# Patient Record
Sex: Female | Born: 1940 | Race: White | Hispanic: No | Marital: Married | Smoking: Never smoker
Health system: Southern US, Community
[De-identification: ages and names within clinical notes are randomized; demographics above are authoritative.]

## PROBLEM LIST (undated history)

## (undated) DIAGNOSIS — Z923 Personal history of irradiation: Secondary | ICD-10-CM

## (undated) DIAGNOSIS — D696 Thrombocytopenia, unspecified: Secondary | ICD-10-CM

## (undated) DIAGNOSIS — C220 Liver cell carcinoma: Secondary | ICD-10-CM

## (undated) DIAGNOSIS — Z7189 Other specified counseling: Secondary | ICD-10-CM

## (undated) DIAGNOSIS — M8580 Other specified disorders of bone density and structure, unspecified site: Secondary | ICD-10-CM

## (undated) DIAGNOSIS — E039 Hypothyroidism, unspecified: Secondary | ICD-10-CM

## (undated) DIAGNOSIS — K7460 Unspecified cirrhosis of liver: Secondary | ICD-10-CM

## (undated) DIAGNOSIS — D219 Benign neoplasm of connective and other soft tissue, unspecified: Secondary | ICD-10-CM

## (undated) DIAGNOSIS — E119 Type 2 diabetes mellitus without complications: Secondary | ICD-10-CM

## (undated) DIAGNOSIS — C50919 Malignant neoplasm of unspecified site of unspecified female breast: Secondary | ICD-10-CM

## (undated) DIAGNOSIS — F0390 Unspecified dementia without behavioral disturbance: Secondary | ICD-10-CM

## (undated) DIAGNOSIS — E079 Disorder of thyroid, unspecified: Secondary | ICD-10-CM

## (undated) HISTORY — DX: Type 2 diabetes mellitus without complications: E11.9

## (undated) HISTORY — DX: Unspecified cirrhosis of liver: K74.60

## (undated) HISTORY — DX: Disorder of thyroid, unspecified: E07.9

## (undated) HISTORY — DX: Other specified disorders of bone density and structure, unspecified site: M85.80

## (undated) HISTORY — PX: CHOLECYSTECTOMY: SHX55

## (undated) HISTORY — DX: Liver cell carcinoma: C22.0

## (undated) HISTORY — DX: Malignant neoplasm of unspecified site of unspecified female breast: C50.919

## (undated) HISTORY — PX: ABDOMINAL HYSTERECTOMY: SHX81

## (undated) HISTORY — DX: Benign neoplasm of connective and other soft tissue, unspecified: D21.9

---
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
## 1976-03-25 HISTORY — PX: TUBAL LIGATION: SHX77

## 1997-10-25 ENCOUNTER — Other Ambulatory Visit: Admission: RE | Admit: 1997-10-25 | Discharge: 1997-10-25 | Payer: Self-pay | Admitting: Obstetrics and Gynecology

## 2001-08-06 ENCOUNTER — Encounter (INDEPENDENT_AMBULATORY_CARE_PROVIDER_SITE_OTHER): Payer: Self-pay | Admitting: Specialist

## 2001-08-06 ENCOUNTER — Inpatient Hospital Stay (HOSPITAL_COMMUNITY): Admission: RE | Admit: 2001-08-06 | Discharge: 2001-08-08 | Payer: Self-pay | Admitting: Obstetrics and Gynecology

## 2004-06-14 ENCOUNTER — Ambulatory Visit: Payer: Self-pay | Admitting: Gastroenterology

## 2004-06-15 ENCOUNTER — Ambulatory Visit: Payer: Self-pay | Admitting: Gastroenterology

## 2004-06-20 ENCOUNTER — Ambulatory Visit: Payer: Self-pay | Admitting: Gastroenterology

## 2006-03-25 DIAGNOSIS — Z923 Personal history of irradiation: Secondary | ICD-10-CM

## 2006-03-25 HISTORY — DX: Personal history of irradiation: Z92.3

## 2006-03-25 HISTORY — PX: BREAST LUMPECTOMY: SHX2

## 2006-05-24 DIAGNOSIS — C50919 Malignant neoplasm of unspecified site of unspecified female breast: Secondary | ICD-10-CM

## 2006-05-24 HISTORY — DX: Malignant neoplasm of unspecified site of unspecified female breast: C50.919

## 2006-06-23 ENCOUNTER — Encounter: Admission: RE | Admit: 2006-06-23 | Discharge: 2006-06-23 | Payer: Self-pay | Admitting: General Surgery

## 2006-06-25 ENCOUNTER — Encounter (INDEPENDENT_AMBULATORY_CARE_PROVIDER_SITE_OTHER): Payer: Self-pay | Admitting: Specialist

## 2006-06-25 HISTORY — PX: BREAST LUMPECTOMY WITH AXILLARY LYMPH NODE BIOPSY: SHX5593

## 2006-06-26 ENCOUNTER — Encounter: Admission: RE | Admit: 2006-06-26 | Discharge: 2006-06-26 | Payer: Self-pay | Admitting: General Surgery

## 2006-06-26 ENCOUNTER — Ambulatory Visit (HOSPITAL_COMMUNITY): Admission: RE | Admit: 2006-06-26 | Discharge: 2006-06-26 | Payer: Self-pay | Admitting: General Surgery

## 2006-06-26 ENCOUNTER — Ambulatory Visit (HOSPITAL_BASED_OUTPATIENT_CLINIC_OR_DEPARTMENT_OTHER): Admission: RE | Admit: 2006-06-26 | Discharge: 2006-06-26 | Payer: Self-pay | Admitting: General Surgery

## 2006-07-09 ENCOUNTER — Ambulatory Visit: Payer: Self-pay | Admitting: Oncology

## 2006-07-16 LAB — CBC WITH DIFFERENTIAL/PLATELET
Eosinophils Absolute: 0 10*3/uL (ref 0.0–0.5)
LYMPH%: 28 % (ref 14.0–48.0)
MCH: 34.2 pg — ABNORMAL HIGH (ref 26.0–34.0)
MCHC: 35 g/dL (ref 32.0–36.0)
MCV: 97.8 fL (ref 81.0–101.0)
MONO%: 6.6 % (ref 0.0–13.0)
NEUT#: 1.8 10*3/uL (ref 1.5–6.5)
Platelets: 185 10*3/uL (ref 145–400)
RBC: 3.71 10*6/uL (ref 3.70–5.32)

## 2006-07-16 LAB — COMPREHENSIVE METABOLIC PANEL
Alkaline Phosphatase: 75 U/L (ref 39–117)
Glucose, Bld: 197 mg/dL — ABNORMAL HIGH (ref 70–99)
Sodium: 141 mEq/L (ref 135–145)
Total Bilirubin: 0.6 mg/dL (ref 0.3–1.2)
Total Protein: 7 g/dL (ref 6.0–8.3)

## 2006-07-16 LAB — LACTATE DEHYDROGENASE: LDH: 159 U/L (ref 94–250)

## 2006-07-16 LAB — CANCER ANTIGEN 27.29: CA 27.29: 30 U/mL (ref 0–39)

## 2006-07-24 ENCOUNTER — Ambulatory Visit (HOSPITAL_COMMUNITY): Admission: RE | Admit: 2006-07-24 | Discharge: 2006-07-24 | Payer: Self-pay | Admitting: Oncology

## 2006-07-24 ENCOUNTER — Encounter: Admission: RE | Admit: 2006-07-24 | Discharge: 2006-07-24 | Payer: Self-pay | Admitting: Oncology

## 2006-07-30 ENCOUNTER — Ambulatory Visit: Admission: RE | Admit: 2006-07-30 | Discharge: 2006-10-08 | Payer: Self-pay | Admitting: Radiation Oncology

## 2006-07-31 LAB — MORPHOLOGY

## 2006-07-31 LAB — CBC WITH DIFFERENTIAL/PLATELET
BASO%: 0.6 % (ref 0.0–2.0)
Basophils Absolute: 0 10*3/uL (ref 0.0–0.1)
EOS%: 2.3 % (ref 0.0–7.0)
HGB: 12.9 g/dL (ref 11.6–15.9)
MCH: 34.3 pg — ABNORMAL HIGH (ref 26.0–34.0)
MCHC: 35.2 g/dL (ref 32.0–36.0)
MCV: 97.4 fL (ref 81.0–101.0)
MONO%: 9 % (ref 0.0–13.0)
RDW: 13.6 % (ref 11.3–14.5)
lymph#: 0.9 10*3/uL (ref 0.9–3.3)

## 2006-09-23 ENCOUNTER — Ambulatory Visit: Payer: Self-pay | Admitting: Oncology

## 2006-09-25 LAB — COMPREHENSIVE METABOLIC PANEL
CO2: 23 mEq/L (ref 19–32)
Calcium: 9.7 mg/dL (ref 8.4–10.5)
Creatinine, Ser: 1.1 mg/dL (ref 0.40–1.20)
Glucose, Bld: 86 mg/dL (ref 70–99)
Total Bilirubin: 0.5 mg/dL (ref 0.3–1.2)

## 2006-09-25 LAB — CBC WITH DIFFERENTIAL/PLATELET
Basophils Absolute: 0 10*3/uL (ref 0.0–0.1)
EOS%: 1.4 % (ref 0.0–7.0)
HCT: 35.4 % (ref 34.8–46.6)
HGB: 12.5 g/dL (ref 11.6–15.9)
LYMPH%: 20.3 % (ref 14.0–48.0)
MCH: 35 pg — ABNORMAL HIGH (ref 26.0–34.0)
MCV: 99.1 fL (ref 81.0–101.0)
MONO%: 13 % (ref 0.0–13.0)
NEUT%: 64.9 % (ref 39.6–76.8)
Platelets: 142 10*3/uL — ABNORMAL LOW (ref 145–400)

## 2007-01-14 ENCOUNTER — Ambulatory Visit: Payer: Self-pay | Admitting: Oncology

## 2007-01-16 LAB — COMPREHENSIVE METABOLIC PANEL
ALT: 44 U/L — ABNORMAL HIGH (ref 0–35)
CO2: 24 mEq/L (ref 19–32)
Calcium: 9 mg/dL (ref 8.4–10.5)
Chloride: 105 mEq/L (ref 96–112)
Glucose, Bld: 231 mg/dL — ABNORMAL HIGH (ref 70–99)
Sodium: 139 mEq/L (ref 135–145)
Total Bilirubin: 0.4 mg/dL (ref 0.3–1.2)
Total Protein: 6.8 g/dL (ref 6.0–8.3)

## 2007-01-16 LAB — CBC WITH DIFFERENTIAL/PLATELET
Eosinophils Absolute: 0 10*3/uL (ref 0.0–0.5)
HCT: 34.6 % — ABNORMAL LOW (ref 34.8–46.6)
LYMPH%: 21.1 % (ref 14.0–48.0)
MONO#: 0.3 10*3/uL (ref 0.1–0.9)
NEUT#: 2 10*3/uL (ref 1.5–6.5)
NEUT%: 68.6 % (ref 39.6–76.8)
Platelets: 142 10*3/uL — ABNORMAL LOW (ref 145–400)
RBC: 3.48 10*6/uL — ABNORMAL LOW (ref 3.70–5.32)
WBC: 2.9 10*3/uL — ABNORMAL LOW (ref 3.9–10.0)
lymph#: 0.6 10*3/uL — ABNORMAL LOW (ref 0.9–3.3)

## 2007-01-16 LAB — LACTATE DEHYDROGENASE: LDH: 172 U/L (ref 94–250)

## 2007-04-17 ENCOUNTER — Encounter: Admission: RE | Admit: 2007-04-17 | Discharge: 2007-04-17 | Payer: Self-pay | Admitting: Oncology

## 2007-06-04 ENCOUNTER — Ambulatory Visit: Payer: Self-pay | Admitting: Oncology

## 2007-06-08 LAB — CBC WITH DIFFERENTIAL/PLATELET
Basophils Absolute: 0 10*3/uL (ref 0.0–0.1)
EOS%: 1.6 % (ref 0.0–7.0)
Eosinophils Absolute: 0.1 10*3/uL (ref 0.0–0.5)
HGB: 12.7 g/dL (ref 11.6–15.9)
MONO#: 0.3 10*3/uL (ref 0.1–0.9)
NEUT#: 2.7 10*3/uL (ref 1.5–6.5)
RDW: 13.3 % (ref 11.3–14.5)
WBC: 3.8 10*3/uL — ABNORMAL LOW (ref 3.9–10.0)
lymph#: 0.8 10*3/uL — ABNORMAL LOW (ref 0.9–3.3)

## 2007-06-09 LAB — COMPREHENSIVE METABOLIC PANEL
AST: 90 U/L — ABNORMAL HIGH (ref 0–37)
Albumin: 4.2 g/dL (ref 3.5–5.2)
BUN: 16 mg/dL (ref 6–23)
CO2: 22 mEq/L (ref 19–32)
Calcium: 9.3 mg/dL (ref 8.4–10.5)
Chloride: 104 mEq/L (ref 96–112)
Glucose, Bld: 174 mg/dL — ABNORMAL HIGH (ref 70–99)
Potassium: 3.9 mEq/L (ref 3.5–5.3)

## 2007-06-09 LAB — CANCER ANTIGEN 27.29: CA 27.29: 34 U/mL (ref 0–39)

## 2007-06-09 LAB — LACTATE DEHYDROGENASE: LDH: 181 U/L (ref 94–250)

## 2007-09-23 ENCOUNTER — Encounter: Admission: RE | Admit: 2007-09-23 | Discharge: 2007-09-23 | Payer: Self-pay | Admitting: Oncology

## 2008-01-18 ENCOUNTER — Ambulatory Visit: Payer: Self-pay | Admitting: Oncology

## 2008-01-20 LAB — CBC WITH DIFFERENTIAL/PLATELET
BASO%: 0.3 % (ref 0.0–2.0)
EOS%: 1.8 % (ref 0.0–7.0)
HCT: 33.5 % — ABNORMAL LOW (ref 34.8–46.6)
MCH: 36.2 pg — ABNORMAL HIGH (ref 26.0–34.0)
MCHC: 35.3 g/dL (ref 32.0–36.0)
MCV: 102.5 fL — ABNORMAL HIGH (ref 81.0–101.0)
MONO%: 9.1 % (ref 0.0–13.0)
NEUT%: 62.9 % (ref 39.6–76.8)
lymph#: 0.8 10*3/uL — ABNORMAL LOW (ref 0.9–3.3)

## 2008-01-21 LAB — LACTATE DEHYDROGENASE: LDH: 168 U/L (ref 94–250)

## 2008-01-21 LAB — COMPREHENSIVE METABOLIC PANEL
ALT: 71 U/L — ABNORMAL HIGH (ref 0–35)
AST: 69 U/L — ABNORMAL HIGH (ref 0–37)
Alkaline Phosphatase: 96 U/L (ref 39–117)
Calcium: 9.1 mg/dL (ref 8.4–10.5)
Chloride: 106 mEq/L (ref 96–112)
Creatinine, Ser: 0.77 mg/dL (ref 0.40–1.20)
Total Bilirubin: 0.6 mg/dL (ref 0.3–1.2)

## 2008-01-21 LAB — VITAMIN D 25 HYDROXY (VIT D DEFICIENCY, FRACTURES): Vit D, 25-Hydroxy: 34 ng/mL (ref 30–89)

## 2008-02-03 LAB — CBC & DIFF AND RETIC
BASO%: 0.5 % (ref 0.0–2.0)
Basophils Absolute: 0 10*3/uL (ref 0.0–0.1)
EOS%: 1.4 % (ref 0.0–7.0)
HGB: 12.9 g/dL (ref 11.6–15.9)
MCH: 36.3 pg — ABNORMAL HIGH (ref 26.0–34.0)
MCHC: 35.1 g/dL (ref 32.0–36.0)
MCV: 103.4 fL — ABNORMAL HIGH (ref 81.0–101.0)
MONO%: 8.2 % (ref 0.0–13.0)
RDW: 13.3 % (ref 11.3–14.5)
RETIC #: 46.4 10*3/uL (ref 19.7–115.1)
lymph#: 1 10*3/uL (ref 0.9–3.3)

## 2008-02-03 LAB — MORPHOLOGY: PLT EST: DECREASED

## 2008-02-03 LAB — CHCC SMEAR

## 2008-02-05 LAB — VITAMIN B12: Vitamin B-12: 402 pg/mL (ref 211–911)

## 2008-02-05 LAB — PROTEIN ELECTROPHORESIS, SERUM
Beta Globulin: 8.1 % — ABNORMAL HIGH (ref 4.7–7.2)
Total Protein, Serum Electrophoresis: 7.1 g/dL (ref 6.0–8.3)

## 2008-02-05 LAB — FOLATE RBC: RBC Folate: 704 ng/mL — ABNORMAL HIGH (ref 180–600)

## 2008-02-05 LAB — IRON AND TIBC
%SAT: 22 % (ref 20–55)
Iron: 92 ug/dL (ref 42–145)
TIBC: 421 ug/dL (ref 250–470)
UIBC: 329 ug/dL

## 2008-02-09 ENCOUNTER — Ambulatory Visit (HOSPITAL_COMMUNITY): Admission: RE | Admit: 2008-02-09 | Discharge: 2008-02-09 | Payer: Self-pay | Admitting: Internal Medicine

## 2008-03-30 ENCOUNTER — Encounter (INDEPENDENT_AMBULATORY_CARE_PROVIDER_SITE_OTHER): Payer: Self-pay | Admitting: General Surgery

## 2008-03-30 ENCOUNTER — Inpatient Hospital Stay (HOSPITAL_COMMUNITY): Admission: RE | Admit: 2008-03-30 | Discharge: 2008-04-01 | Payer: Self-pay | Admitting: General Surgery

## 2008-04-20 ENCOUNTER — Encounter: Admission: RE | Admit: 2008-04-20 | Discharge: 2008-04-20 | Payer: Self-pay | Admitting: Oncology

## 2008-05-11 ENCOUNTER — Ambulatory Visit (HOSPITAL_COMMUNITY): Admission: RE | Admit: 2008-05-11 | Discharge: 2008-05-11 | Payer: Self-pay | Admitting: Internal Medicine

## 2008-08-15 ENCOUNTER — Ambulatory Visit: Payer: Self-pay | Admitting: Oncology

## 2008-08-17 LAB — CBC WITH DIFFERENTIAL/PLATELET
BASO%: 0.4 % (ref 0.0–2.0)
Basophils Absolute: 0 10*3/uL (ref 0.0–0.1)
EOS%: 1.3 % (ref 0.0–7.0)
HCT: 36.2 % (ref 34.8–46.6)
HGB: 12.4 g/dL (ref 11.6–15.9)
MCH: 35.6 pg — ABNORMAL HIGH (ref 25.1–34.0)
MCHC: 34.4 g/dL (ref 31.5–36.0)
MONO#: 0.3 10*3/uL (ref 0.1–0.9)
NEUT%: 61.5 % (ref 38.4–76.8)
RDW: 13.6 % (ref 11.2–14.5)
WBC: 3.6 10*3/uL — ABNORMAL LOW (ref 3.9–10.3)
lymph#: 1 10*3/uL (ref 0.9–3.3)

## 2008-08-17 LAB — COMPREHENSIVE METABOLIC PANEL
ALT: 58 U/L — ABNORMAL HIGH (ref 0–35)
AST: 70 U/L — ABNORMAL HIGH (ref 0–37)
Albumin: 3.6 g/dL (ref 3.5–5.2)
CO2: 29 mEq/L (ref 19–32)
Calcium: 9.4 mg/dL (ref 8.4–10.5)
Chloride: 104 mEq/L (ref 96–112)
Potassium: 4.2 mEq/L (ref 3.5–5.3)

## 2008-08-17 LAB — LACTATE DEHYDROGENASE: LDH: 159 U/L (ref 94–250)

## 2008-08-18 LAB — VITAMIN D 25 HYDROXY (VIT D DEFICIENCY, FRACTURES): Vit D, 25-Hydroxy: 30 ng/mL (ref 30–89)

## 2008-10-24 ENCOUNTER — Ambulatory Visit: Payer: Self-pay | Admitting: Oncology

## 2008-11-09 ENCOUNTER — Encounter: Admission: RE | Admit: 2008-11-09 | Discharge: 2008-11-09 | Payer: Self-pay | Admitting: Oncology

## 2008-11-23 ENCOUNTER — Ambulatory Visit: Payer: Self-pay | Admitting: Oncology

## 2008-12-09 ENCOUNTER — Encounter: Admission: RE | Admit: 2008-12-09 | Discharge: 2008-12-09 | Payer: Self-pay | Admitting: Gastroenterology

## 2009-04-26 ENCOUNTER — Encounter: Admission: RE | Admit: 2009-04-26 | Discharge: 2009-04-26 | Payer: Self-pay | Admitting: Oncology

## 2009-05-15 ENCOUNTER — Ambulatory Visit: Payer: Self-pay | Admitting: Oncology

## 2009-05-17 LAB — COMPREHENSIVE METABOLIC PANEL
AST: 93 U/L — ABNORMAL HIGH (ref 0–37)
Albumin: 3.8 g/dL (ref 3.5–5.2)
BUN: 14 mg/dL (ref 6–23)
CO2: 27 mEq/L (ref 19–32)
Calcium: 9 mg/dL (ref 8.4–10.5)
Chloride: 107 mEq/L (ref 96–112)
Total Protein: 7 g/dL (ref 6.0–8.3)

## 2009-05-17 LAB — CBC WITH DIFFERENTIAL/PLATELET
Basophils Absolute: 0 10*3/uL (ref 0.0–0.1)
EOS%: 1.8 % (ref 0.0–7.0)
HGB: 12.9 g/dL (ref 11.6–15.9)
MCHC: 34.7 g/dL (ref 31.5–36.0)
MONO%: 9.2 % (ref 0.0–14.0)
NEUT%: 61 % (ref 38.4–76.8)
RDW: 13.3 % (ref 11.2–14.5)
lymph#: 1 10*3/uL (ref 0.9–3.3)

## 2009-05-17 LAB — VITAMIN D 25 HYDROXY (VIT D DEFICIENCY, FRACTURES): Vit D, 25-Hydroxy: 34 ng/mL (ref 30–89)

## 2009-06-23 ENCOUNTER — Encounter: Admission: RE | Admit: 2009-06-23 | Discharge: 2009-06-23 | Payer: Self-pay | Admitting: Gastroenterology

## 2009-10-04 ENCOUNTER — Encounter (HOSPITAL_COMMUNITY): Admission: RE | Admit: 2009-10-04 | Discharge: 2009-11-29 | Payer: Self-pay | Admitting: Internal Medicine

## 2010-02-19 ENCOUNTER — Ambulatory Visit: Payer: Self-pay | Admitting: Oncology

## 2010-02-21 ENCOUNTER — Ambulatory Visit: Payer: Self-pay | Admitting: Oncology

## 2010-02-21 LAB — COMPREHENSIVE METABOLIC PANEL
AST: 98 U/L — ABNORMAL HIGH (ref 0–37)
Albumin: 3.6 g/dL (ref 3.5–5.2)
CO2: 26 mEq/L (ref 19–32)
Calcium: 9.3 mg/dL (ref 8.4–10.5)
Chloride: 104 mEq/L (ref 96–112)

## 2010-02-21 LAB — CBC WITH DIFFERENTIAL/PLATELET
BASO%: 0.7 % (ref 0.0–2.0)
Basophils Absolute: 0 10*3/uL (ref 0.0–0.1)
EOS%: 1.9 % (ref 0.0–7.0)
Eosinophils Absolute: 0.1 10*3/uL (ref 0.0–0.5)
LYMPH%: 25 % (ref 14.0–49.7)
MCV: 103.3 fL — ABNORMAL HIGH (ref 79.5–101.0)
MONO#: 0.3 10*3/uL (ref 0.1–0.9)
Platelets: 107 10*3/uL — ABNORMAL LOW (ref 145–400)
WBC: 3.2 10*3/uL — ABNORMAL LOW (ref 3.9–10.3)
lymph#: 0.8 10*3/uL — ABNORMAL LOW (ref 0.9–3.3)

## 2010-02-21 LAB — LACTATE DEHYDROGENASE: LDH: 203 U/L (ref 94–250)

## 2010-02-22 LAB — CANCER ANTIGEN 27.29: CA 27.29: 24 U/mL (ref 0–39)

## 2010-02-22 LAB — VITAMIN D 25 HYDROXY (VIT D DEFICIENCY, FRACTURES): Vit D, 25-Hydroxy: 32 ng/mL (ref 30–89)

## 2010-05-17 ENCOUNTER — Other Ambulatory Visit: Payer: Self-pay | Admitting: Oncology

## 2010-05-17 DIAGNOSIS — Z9889 Other specified postprocedural states: Secondary | ICD-10-CM

## 2010-05-25 ENCOUNTER — Ambulatory Visit
Admission: RE | Admit: 2010-05-25 | Discharge: 2010-05-25 | Disposition: A | Discharge status: Home / Self Care | Payer: Medicare Other | Source: Ambulatory Visit | Attending: Oncology | Admitting: Oncology

## 2010-05-25 DIAGNOSIS — Z9889 Other specified postprocedural states: Secondary | ICD-10-CM

## 2010-07-09 LAB — COMPREHENSIVE METABOLIC PANEL
ALT: 104 U/L — ABNORMAL HIGH (ref 0–35)
Alkaline Phosphatase: 64 U/L (ref 39–117)
BUN: 11 mg/dL (ref 6–23)
CO2: 28 mEq/L (ref 19–32)
Calcium: 8.8 mg/dL (ref 8.4–10.5)
GFR calc non Af Amer: >60 mL/min (ref >60)
Glucose, Bld: 173 mg/dL — ABNORMAL HIGH (ref 70–99)
Sodium: 136 mEq/L (ref 135–145)

## 2010-07-09 LAB — HEPATITIS B SURFACE ANTIBODY,QUALITATIVE: Hep B S Ab: NEGATIVE

## 2010-07-09 LAB — HEPATITIS B SURFACE ANTIGEN: Hepatitis B Surface Ag: NEGATIVE

## 2010-07-09 LAB — GLUCOSE, CAPILLARY
Glucose-Capillary: 132 mg/dL — ABNORMAL HIGH (ref 70–99)
Glucose-Capillary: 141 mg/dL — ABNORMAL HIGH (ref 70–99)
Glucose-Capillary: 189 mg/dL — ABNORMAL HIGH (ref 70–99)
Glucose-Capillary: 228 mg/dL — ABNORMAL HIGH (ref 70–99)
Glucose-Capillary: 268 mg/dL — ABNORMAL HIGH (ref 70–99)

## 2010-07-25 ENCOUNTER — Other Ambulatory Visit: Payer: Self-pay | Admitting: Gastroenterology

## 2010-07-25 DIAGNOSIS — C229 Malignant neoplasm of liver, not specified as primary or secondary: Secondary | ICD-10-CM

## 2010-07-25 DIAGNOSIS — K7460 Unspecified cirrhosis of liver: Secondary | ICD-10-CM

## 2010-07-30 ENCOUNTER — Ambulatory Visit
Admission: RE | Admit: 2010-07-30 | Discharge: 2010-07-30 | Disposition: A | Discharge status: Home / Self Care | Payer: Medicare Other | Source: Ambulatory Visit | Attending: Gastroenterology | Admitting: Gastroenterology

## 2010-07-30 DIAGNOSIS — K7460 Unspecified cirrhosis of liver: Secondary | ICD-10-CM

## 2010-07-30 DIAGNOSIS — C229 Malignant neoplasm of liver, not specified as primary or secondary: Secondary | ICD-10-CM

## 2010-08-07 NOTE — Consult Note (Signed)
Beverly Davis, Beverly Davis              ACCOUNT NO.:  [id number]   MEDICAL RECORD NO.:  [id number]          PATIENT TYPE:  OIB   LOCATION:  5128                         FACILITY:  [facility]   PHYSICIAN:  Tory Emerald. Benson Norway, MD    DATE OF BIRTH:  02/01/1941   DATE OF CONSULTATION:  03/30/2008  DATE OF DISCHARGE:                                 CONSULTATION   REASON FOR CONSULTATION:  Choledocholithiasis.   REFERRING PHYSICIAN:  Mare Loan, MD   PRIMARY CARE Owen Pagnotta:  Darcus Austin, DO   HISTORY OF PRESENT ILLNESS:  This is a 70 year old female with a past  medical history of hypothyroidism, diabetes, hyperlipidemia, status post  breast cancer, status post tubal ligation and hysterectomy.  She was  admitted to the hospital for an elective laparoscopic cholecystectomy.  The patient apparently had complained of some abdominal pain and workup  through her primary care [name] revealed that there was a gallbladder  mass in the fundus that measure approximately 1.2 cm and it was  hypervascular.  The recommendation was for her undergo a surgical  excision of this type of finding.  The patient was also found to have  abnormal liver enzymes during her prior workup and there was also the  suggestion of cirrhosis during the ultrasound.  Subsequently, the  patient was referred to Dr. Zenia Resides and she underwent a laparoscopic  cholecystectomy without complications.  During the procedure, there was  a gross appearance of cirrhosis on the liver and a biopsy was obtained  and the intraoperative cholangiogram did reveal there was a question of  stones, most likely a collection of stones in the distal CBD and there  was no drainage of contrast into the duodenum.  Subsequently, GI  consultation was requested for further evaluation and treatment.  In  regards to her cirrhosis, there was no history of alcohol abuse or any  illicit drug use.  The patient does have a family history significant  for cirrhosis in  a grandaunt and also her younger brother; however,  there was a significant history of alcohol abuse in her younger brother.  She does have history of hypothyroidism and also diabetes, but there was  no other family history of autoimmune diseases.  Unfortunately at this  time, the patient is still somnolent from her anesthesia and she is  unable to answer any questions for herself.   PAST MEDICAL HISTORY AND PAST SURGICAL HISTORY:  As stated above.   FAMILY HISTORY:  Noncontributory.   SOCIAL HISTORY:  She is married, has one child.  No alcohol, tobacco, or  illicit drug use.   REVIEW OF SYSTEMS:  Unable to obtain at this time.   HOME MEDICATIONS:  1. Levothyroxine 88 mg one p.o. daily.  2. Metformin 500 mg p.o. b.i.d.  3. Tricor 145 mg of p.o. daily.  4. Zetia 10 mg p.o. daily.  5. Calcium 600 mg 2 tablets p.o. daily.  6. Fish oil 1000 mg b.i.d.  7. Flaxseed oil.  8. Tamoxifen 20 mg one p.o. daily.   PHYSICAL EXAMINATION:  VITAL SIGNS:  Stable.  GENERAL:  The patient does not appear to be any acute distress; however,  she is very somnolent at this time.  HEENT:  Normocephalic and atraumatic.  Extraocular muscles are intact.  NECK:  Supple.  No lymphadenopathy.  LUNGS:  Clear to auscultation bilaterally.  CARDIOVASCULAR:  Regular rate and rhythm.  ABDOMEN:  Soft, tender at the incision sites.  No rebound or rigidity.  Positive bowel sounds.  EXTREMITIES:  No clubbing, cyanosis, or edema.   LABORATORY VALUES:  White blood cell count is 3.7, hemoglobin is 12.6,  MCV is 104, platelets at 111.  Sodium 134, potassium 4.5, chloride 101,  CO2 26, serum glucose 169, BUN 9, creatinine 0.7, total bilirubin 1.3,  AST is 109, ALT 92, albumin is 3.6.   IMPRESSION:  1. Probable choledocholithiasis.  2. Gallbladder polyp/mass.  3. Cholelithiasis, status post laparoscopic cholecystectomy.  4. Gross evidence of cirrhosis.  Unfortunately, I am not able to      obtain any further history  from the patient because of her mental      state.  However, it appears rather straight forward that she does      not have any risk factors in regards to alcohol or illicit drug      use, although the issue of transfusion is unknown from the past.      She will require an ERCP to extract what is presumed to be stones.      I did discuss with her sister and son the risks of bleeding,      infection, perforation, medication reactions, pancreatitis, and      death, although which are not exclusive of any other unforeseen      complications that may occur with the procedure and they agree and      consent to proceed with the procedure.  However, I will discuss      these risks again with the patient when she is in a week in stable      state.  The issue with the cirrhosis will require further      evaluation.  I am uncertain if hepatitis B and C serologies were      obtained in the past, but I will go ahead and order these at this      time.  Additionally, she may also have an autoimmune disorder and      ANA will be helpful.  I will refrain from ordering other esoteric      etiologies at this time until I can discuss the situation further      with the patient.  Overall, she does appear stable, her platelets      are at 111 and no bleeding complications were observed with the      surgery.  Therefore, I believe she will tolerate ERCP without any      difficulty.  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   Plan at this time is to;  1. Perform ERCP with sphincterotomy.  2. Check for an ANA and viral hepatitis panel.      Tory Emerald Benson Norway, MD  Electronically Signed     PDH/MEDQ  D:  03/30/2008  T:  03/31/2008  Job:  [id number]   cc:   Mare Loan, MD  Darcus Austin, D.O.  Eston Esters, MD

## 2010-08-07 NOTE — Discharge Summary (Signed)
Beverly Davis, Beverly Davis              ACCOUNT NO.:  [id number]   MEDICAL RECORD NO.:  [id number]          PATIENT TYPE:  INP   LOCATION:  9735                         FACILITY:  [facility]   PHYSICIAN:  Mare Loan, MD      DATE OF BIRTH:  March 16, 1941   DATE OF ADMISSION:  03/30/2008  DATE OF DISCHARGE:  04/02/2007                               DISCHARGE SUMMARY   FINAL DIAGNOSIS:  Cholelithiasis and cirrhosis.   PROCEDURE:  March 30, 2008, laparoscopic cholecystectomy cholangiogram  and liver biopsy on April 01, 2007, ARCP with no stone seen, evidence  of cirrhosis.   HOSPITAL COURSE:  [name] is a 70 year old female whom I have seen  in my office for possible gallbladder mass.  She was taken to the  operating room on March 30, 2008, for laparoscopic cholecystectomy.  A  cholangiogram at that time revealed no flow from the common duct into  the duodenum.  She did have stones within the gallbladder, therefore I  had talked to Dr. Carol Ada to an [facility] for further evaluation.  This  was done March 31, 2008, there was no evidence of stones in the common  bile duct.  She did have evidence of cirrhosis with gastropathy noted in  the stomach.  The liver biopsy showed evidence of cirrhosis and end-  stage liver disease.  Her hepatitis panel is negative.  She has been  stable postoperatively and been afebrile with coverage of insulin while  she has been n.p.o. for her diabetes.  She is now started on her home  medications including metformin.  She was discharged with Percocet for  pain and will follow up with me in 2-3 weeks and follow up with Dr. Benson Norway  in approximately 2 weeks.  Dr. Benson Norway will further investigate the  cirrhosis this likely a NASH and I suspect she may need to.  I would  refer to Dr. Benson Norway for further testing.  I have instructed her to wash  her incisions daily, perform no heavy lifting over 20 pounds.  She is to  call over the weekend if she develops worsening abdominal  pain or back  pain, that is not relieved with the Percocet.  She has been advised by  the pharmacy staff to discontinue her Metformin due to elevation of  liver enzymes.  She will need to follow up with Dr. Johnnye Sima for  further diabetic management.      Mare Loan, MD  Electronically Signed     ALA/MEDQ  D:  04/01/2008  T:  04/01/2008  Job:  [id number]   cc:   Darcus Austin, D.O.  Tory Emerald Benson Norway, MD

## 2010-08-07 NOTE — Op Note (Signed)
NAMEJAQUETTA, CURRIER              ACCOUNT NO.:  [id number]   MEDICAL RECORD NO.:  [id number]          PATIENT TYPE:  OIB   LOCATION:  5128                         FACILITY:  [facility]   PHYSICIAN:  Mare Loan, MD      DATE OF BIRTH:  [birth year]   DATE OF PROCEDURE:  03/30/2008  DATE OF DISCHARGE:                               OPERATIVE REPORT   PREOPERATIVE DIAGNOSIS:  Gallbladder mass with elevated liver enzymes.   POSTOPERATIVE DIAGNOSES:  Presumed gallbladder mass, elevated liver  enzymes, cholelithiasis and possible choledocholithiasis.   PROCEDURES:  Laparoscopic cholecystectomy, intraoperative cholangiogram  and liver biopsy.   SURGEON:  Mare Loan, MD   ASSISTANTS:  None.   FINDINGS:  Small stones in the gallbladder, nodular liver and no flow  into the duodenum from common bile duct, right and left hepatic ducts  were patent.  No mass palpated in gallbladder.   BLOOD LOSS:  Approximately 15 mL.   COMPLICATIONS:  None.   NO Drains were placed.   INDICATIONS FOR PROCEDURE:  Ms. Vasconcelos is a 70 year old female I had  seen due to findings of a liver mass found on ultrasound.  She had  intermittent episodes of having elevation of her liver enzymes.  The  last ultrasound revealed a 1.1 cm polypoid mass at the fundus of the  gallbladder.  The common bile duct was normal and no evidence of stones.  However, previously she had an ultrasound which did show stones.  I  discussed with the patient performing cholecystectomy, liver biopsy and  cholangiogram.  Informed consent was obtained prior to procedure.   DETAILS OF PROCEDURE:  Ms. Wahab was identified in the preoperative  holding area.  She received Cefoxitin and was taken to the operating  room.  Once in the operating room, placed in a supine position.  After  administration of general endotracheal anesthesia, her abdomen was  prepped and draped in the usual sterile fashion.  A time-out procedure  indicating the  patient and procedure was performed.  I placed an  incision at the umbilicus.  Veress needle placed in abdominal cavity.  After adequate pneumo insufflation, I placed a 10-mm trocar into the  abdominal cavity under visualization with camera.  All layers of  abdominal wall were visualized upon entry.  I inspected the abdomen  after placing the trocar.  There was no evidence of  injury upon  placement of the trocar and Veress needle.  I then placed three  additional 5 mm trocars under visualization with camera.  One was placed  at the epigastric region, two in the right side of the abdomen.  The  liver was noted to be very nodular in nature.  I grasped the gallbladder  at the fundus and retracted this superiorly.  I grasped the infundibulum  away from the liver bed using electrocautery and Maryland forceps and  dissected the peritoneum off the gallbladder.  The cystic artery was  coursing anterior to the cystic duct.  I divided this with  electrocautery as welll as placing the clips distally and proximally.  I  continued  dissecting until I had the cystic duct free.  I then placed  the clips distally and transected with laparoscopic scissors.  The  cholangiogram revealed flow into the right and left hepatic duct and  there did not appear to be any flow into the duodenum.  I gave the  patient 1 g of glucagon and waited approximately 3 minutes.  Repeat  cholangiogram revealed the same result.  I could see no stones but there  again was no flow into the duodenum.  I then attempted to perform liver  biopsies using a Tru-Cut liver needle.  Only small pieces of tissue were  collected.  I then cauterized the liver capsule.  The cystic duct was  then clipped proximally and divided with laparoscopic scissors.  The  gallbladder was removed using electrocautery and placed in an Endocatch  bag.  I then performed a liver biopsy at the edge of the liver wherat  the gallbladder fossa superiorly.  This was  also placed in the Endocatch  bag and removed from the abdomen.  Electrocautery was used to control  mild oozing from the liver bed from the biopsy site.  I then placed  FloSeal in this vicinity.  There appeared to be no further evidence of  bleeding.  Surgicel was also placed in the gallbladder bed.  I irrigated  the abdomen with approximately 1.5 liter of saline.  There appeared to  be no evidence of bleeding and no biliary leak.  I then closed the  fascial defect at the umbilical incision with a 0 Vicryl suture.  I then  inspected the abdomen, no evidence of bleeding and no evidence of  injury.  The pneumoperitoneum was released.  The skin was closed with 4-  0 Monocryl and Dermabond placed as final dressing.  I inspected the  gallbladder on the back table.  Multiple stones were found near the  infundibulum of the gallbladder.  The patient was then extubated and  transported to postanesthesia care unit in stable condition.  I will  keep her overnight.  She will be evaluated by Dr. Benson Norway in  Gastroenterology for ERCP tomorrow.  Pending these results, she will  likely be discharged home on Friday.      Mare Loan, MD  Electronically Signed     ALA/MEDQ  D:  03/30/2008  T:  03/30/2008  Job:  [id number]   cc:   Darcus Austin, D.O.

## 2010-08-10 NOTE — H&P (Signed)
[facility]  Patient:    Beverly Davis, TABARES Visit Number: [id number] MRN: [id number]          Service Type: GYN Attending Physician:  Selinda Orion Md Dictated by:   Chauncey Cruel. Olena Mater, M.D. Admit Date:  08/06/2001 Discharge Date: 08/08/2001                           History and Physical  CHIEF COMPLAINT:  Atypical hyperplasia of the endometrium.  HISTORY OF PRESENT ILLNESS:  The patient is a 70 year old gravida 1 female who was on Prempro, stopped this about six months ago and came in withvaginal bleeding.  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  Pap smear was normal.  A CA125 was normal.  Because ofthe atypical hyperplasia, hysterectomy was recommended.  PAST MEDICAL HISTORY:  Other than one child, is a tubal ligation.  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  Subsequent Pap smears have been completely normal.  ALLERGIES:  No known allergies.  COMORBIDITIES:  She was given Lamisil by her family physician and found to have abnormal liver function studies about a year ago.  This consisted of an elevated SGOT and elevated ALT.  Because of this, hepatitis studies have been obtained.  She now presents for hysterectomy for atypical hyperplasia.  REVIEW OF SYSTEMS:  HEENT: Wears glasses but has noticed no decrease in visual or auditory acuity. No frequent headaches.  HEART:  No rheumatic fever.  No hypertension.  No history of mitral valve prolapse.  LUNGS:  No chronic cough, no weightloss or gain, no hemoptysis.  GENITOURINARY:  Denies stress incontinence.  Does have frequency and what sounds like urge. GASTROINTESTINAL:  Negative.  No history of bowel habit change.  No melena, no weight loss or gain.  MUSCLES, BONES, JOINTS:  No fractures or arthritis.  FAMILY HISTORY:  Mother deceased at age 1, had a brain tumor.  Her father died, hada myocardial  infarction and cancer of the prostate.  One brother who is living and well and four sisters.  One sister is diabetic.  PHYSICAL EXAMINATION:  GENERAL:  The patient is a 70 year old well-developed and nourished female who appears to be her stated age.  Is oriented to time, place, and recent events.  VITAL SIGNS:  Weight 181.  Blood pressure120/70.  HEENT:  Unremarkable.  Oropharynx is not injected.  NECK:  Supple.  Thyroid is not enlarged.  Carotid pulses are equal, without bruits.  No adenopathy appreciated.  BREASTS:  No masses or tenderness.  Recent mammogram was negative.  LUNGS:  Clear to percussion andauscultation.  HEART:  Normal sinus rhythm.  No murmurs.  No heaves, thrills, rubs, or gallops.  ABDOMEN:  Soft and flat.  Liver, spleen, and kidneys not palpated.  Bowel sounds are normal.  Femoral pulses are equal.  EXTREMITIES:  Good range of motion, equal pulses and reflexes.  PELVIC:  Anterior normal small-sized uterus with no masses.  RECTOVAGINAL:  Confirms.  LABORATORY DATA:  Hemoccult is negative.  IMPRESSION:  Atypical hyperplasia of the endometrium.  PLAN: TAH/BSO.  The patient has been given details, with informed consent. Dictated by:   S. Olena Mater, M.D. Attending Physician:  Selinda Orion Md DD:  08/06/01 TD:  08/07/01 Job: [id number]

## 2010-09-05 ENCOUNTER — Other Ambulatory Visit: Payer: Self-pay | Admitting: Oncology

## 2010-09-05 ENCOUNTER — Encounter (HOSPITAL_BASED_OUTPATIENT_CLINIC_OR_DEPARTMENT_OTHER): Payer: Medicare Other | Admitting: Oncology

## 2010-09-05 DIAGNOSIS — C50419 Malignant neoplasm of upper-outer quadrant of unspecified female breast: Secondary | ICD-10-CM

## 2010-09-05 LAB — CANCER ANTIGEN 27.29: CA 27.29: 41 U/mL — ABNORMAL HIGH (ref 0–39)

## 2010-09-05 LAB — COMPREHENSIVE METABOLIC PANEL
Alkaline Phosphatase: 105 U/L (ref 39–117)
BUN: 17 mg/dL (ref 6–23)
CO2: 23 mEq/L (ref 19–32)
Glucose, Bld: 193 mg/dL — ABNORMAL HIGH (ref 70–99)
Potassium: 3.9 mEq/L (ref 3.5–5.3)
Sodium: 133 mEq/L — ABNORMAL LOW (ref 135–145)
Total Bilirubin: 0.6 mg/dL (ref 0.3–1.2)
Total Protein: 7 g/dL (ref 6.0–8.3)

## 2010-09-05 LAB — CBC WITH DIFFERENTIAL/PLATELET
BASO%: 0.3 % (ref 0.0–2.0)
EOS%: 1.5 % (ref 0.0–7.0)
MCH: 36.2 pg — ABNORMAL HIGH (ref 25.1–34.0)
MCHC: 34.6 g/dL (ref 31.5–36.0)
RBC: 3.55 10*6/uL — ABNORMAL LOW (ref 3.70–5.45)
RDW: 13 % (ref 11.2–14.5)
lymph#: 0.9 10*3/uL (ref 0.9–3.3)

## 2010-09-05 LAB — VITAMIN D 25 HYDROXY (VIT D DEFICIENCY, FRACTURES): Vit D, 25-Hydroxy: 26 ng/mL — ABNORMAL LOW (ref 30–89)

## 2010-09-12 ENCOUNTER — Other Ambulatory Visit: Payer: Self-pay | Admitting: Oncology

## 2010-09-12 ENCOUNTER — Encounter (HOSPITAL_BASED_OUTPATIENT_CLINIC_OR_DEPARTMENT_OTHER): Payer: Medicare Other | Admitting: Oncology

## 2010-09-12 DIAGNOSIS — Z853 Personal history of malignant neoplasm of breast: Secondary | ICD-10-CM

## 2010-09-12 DIAGNOSIS — C50419 Malignant neoplasm of upper-outer quadrant of unspecified female breast: Secondary | ICD-10-CM

## 2010-11-21 ENCOUNTER — Other Ambulatory Visit: Payer: Medicare Other

## 2010-12-05 ENCOUNTER — Other Ambulatory Visit: Payer: Self-pay | Admitting: Oncology

## 2010-12-05 ENCOUNTER — Encounter (HOSPITAL_BASED_OUTPATIENT_CLINIC_OR_DEPARTMENT_OTHER): Payer: Medicare Other | Admitting: Oncology

## 2010-12-05 DIAGNOSIS — C50419 Malignant neoplasm of upper-outer quadrant of unspecified female breast: Secondary | ICD-10-CM

## 2010-12-12 ENCOUNTER — Ambulatory Visit
Admission: RE | Admit: 2010-12-12 | Discharge: 2010-12-12 | Disposition: A | Discharge status: Home / Self Care | Payer: Medicare Other | Source: Ambulatory Visit | Attending: Oncology | Admitting: Oncology

## 2010-12-12 DIAGNOSIS — Z853 Personal history of malignant neoplasm of breast: Secondary | ICD-10-CM

## 2010-12-28 LAB — COMPREHENSIVE METABOLIC PANEL
ALT: 92 U/L — ABNORMAL HIGH (ref 0–35)
Alkaline Phosphatase: 88 U/L (ref 39–117)
BUN: 9 mg/dL (ref 6–23)
CO2: 26 mEq/L (ref 19–32)
GFR calc non Af Amer: >60 mL/min (ref >60)
Glucose, Bld: 169 mg/dL — ABNORMAL HIGH (ref 70–99)
Potassium: 4.5 mEq/L (ref 3.5–5.1)
Sodium: 134 mEq/L — ABNORMAL LOW (ref 135–145)
Total Bilirubin: 1.3 mg/dL — ABNORMAL HIGH (ref 0.3–1.2)

## 2010-12-28 LAB — CBC
HCT: 37.2 % (ref 36.0–46.0)
MCHC: 33.8 g/dL (ref 30.0–36.0)
MCV: 104.6 fL — ABNORMAL HIGH (ref 78.0–100.0)
Platelets: 111 10*3/uL — ABNORMAL LOW (ref 150–400)
WBC: 3.7 10*3/uL — ABNORMAL LOW (ref 4.0–10.5)

## 2011-02-27 ENCOUNTER — Other Ambulatory Visit (HOSPITAL_BASED_OUTPATIENT_CLINIC_OR_DEPARTMENT_OTHER): Payer: Medicare Other | Admitting: Lab

## 2011-02-27 ENCOUNTER — Other Ambulatory Visit: Payer: Self-pay | Admitting: Oncology

## 2011-02-27 DIAGNOSIS — C50419 Malignant neoplasm of upper-outer quadrant of unspecified female breast: Secondary | ICD-10-CM

## 2011-02-27 LAB — CBC WITH DIFFERENTIAL/PLATELET
BASO%: 0.4 % (ref 0.0–2.0)
LYMPH%: 27.9 % (ref 14.0–49.7)
MCHC: 34.6 g/dL (ref 31.5–36.0)
MONO#: 0.3 10*3/uL (ref 0.1–0.9)
NEUT#: 2 10*3/uL (ref 1.5–6.5)
RBC: 3.46 10*6/uL — ABNORMAL LOW (ref 3.70–5.45)
RDW: 13.5 % (ref 11.2–14.5)
WBC: 3.3 10*3/uL — ABNORMAL LOW (ref 3.9–10.3)
lymph#: 0.9 10*3/uL (ref 0.9–3.3)

## 2011-02-27 LAB — COMPREHENSIVE METABOLIC PANEL
ALT: 74 U/L — ABNORMAL HIGH (ref 0–35)
Albumin: 3.5 g/dL (ref 3.5–5.2)
CO2: 24 mEq/L (ref 19–32)
Potassium: 3.9 mEq/L (ref 3.5–5.3)
Sodium: 135 mEq/L (ref 135–145)
Total Bilirubin: 0.5 mg/dL (ref 0.3–1.2)
Total Protein: 7.3 g/dL (ref 6.0–8.3)

## 2011-02-28 LAB — CANCER ANTIGEN 27.29: CA 27.29: 36 U/mL (ref 0–39)

## 2011-03-06 ENCOUNTER — Telehealth: Payer: Self-pay | Admitting: *Deleted

## 2011-03-06 ENCOUNTER — Ambulatory Visit (HOSPITAL_BASED_OUTPATIENT_CLINIC_OR_DEPARTMENT_OTHER): Payer: Medicare Other | Admitting: Oncology

## 2011-03-06 VITALS — BP 120/75 | HR 67 | Temp 97.6°F | Wt 158.0 lb

## 2011-03-06 DIAGNOSIS — C799 Secondary malignant neoplasm of unspecified site: Secondary | ICD-10-CM

## 2011-03-06 DIAGNOSIS — M899 Disorder of bone, unspecified: Secondary | ICD-10-CM

## 2011-03-06 DIAGNOSIS — Z170 Estrogen receptor positive status [ER+]: Secondary | ICD-10-CM

## 2011-03-06 DIAGNOSIS — E559 Vitamin D deficiency, unspecified: Secondary | ICD-10-CM

## 2011-03-06 DIAGNOSIS — R232 Flushing: Secondary | ICD-10-CM

## 2011-03-06 DIAGNOSIS — N951 Menopausal and female climacteric states: Secondary | ICD-10-CM

## 2011-03-06 DIAGNOSIS — Z23 Encounter for immunization: Secondary | ICD-10-CM

## 2011-03-06 DIAGNOSIS — C50419 Malignant neoplasm of upper-outer quadrant of unspecified female breast: Secondary | ICD-10-CM

## 2011-03-06 DIAGNOSIS — M949 Disorder of cartilage, unspecified: Secondary | ICD-10-CM

## 2011-03-06 MED ORDER — INFLUENZA VIRUS VACC SPLIT PF IM SUSP
0.5000 mL | Freq: Once | INTRAMUSCULAR | Status: AC
  Administered 2011-03-06: 0.5 mL via INTRAMUSCULAR
  Filled 2011-03-06: qty 0.5

## 2011-03-06 MED ORDER — INFLUENZA VIRUS VACC SPLIT PF IM SUSP
0.5000 mL | INTRAMUSCULAR | Status: DC
  Filled 2011-03-06: qty 0.5

## 2011-03-06 NOTE — Telephone Encounter (Signed)
gave patient appointment for 09-2011 labs a week before the doctor's appointment

## 2011-03-06 NOTE — Progress Notes (Signed)
Hematology and Oncology Follow Up Visit  Beverly Davis [id number] 02/10/1941 70 y.o. 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 Hx of cirrhosis  Interim History:  Beverly Davis is doing well apart from hot flashes which are partially controlled by peridin c She denies joint pains.She has had recent mammogram and bone density tests.  Medications: I have reviewed the patient's current medications.  Allergies: No Known Allergies  Past Medical History, Surgical history, Social history, and Family History were reviewed and updated.  Review of Systems: Constitutional:  Negative for fever, chills, night sweats, anorexia, weight loss, pain. Cardiovascular: no chest pain or dyspnea on exertion Respiratory: no cough, shortness of breath, or wheezing Neurological: no TIA or stroke symptoms Dermatological: negative ENT: negative Skin Gastrointestinal: no abdominal pain, change in bowel habits, or black or bloody stools Genito-Urinary: no dysuria, trouble voiding, or hematuria Hematological and Lymphatic: negative Breast: negative for breast lumps Musculoskeletal: negative Remaining ROS negative.  Physical Exam: Blood pressure 120/75, pulse 67, temperature 97.6 F (36.4 C), weight 158 lb (71.668 kg). ECOG: 0 General appearance: alert, cooperative and appears stated age Head: Normocephalic, without obvious abnormality, atraumatic Neck: no adenopathy, no carotid bruit, no JVD, supple, symmetrical, trachea midline and thyroid not enlarged, symmetric, no tenderness/mass/nodules Lymph nodes: Cervical, supraclavicular, and axillary nodes normal. Cardiac : normal Pulmonary:normal Breasts: rt and left breast are free of masses, no nipple retraction or skin changes apart from radiation related. Both axillae are  negative. Abdomen:nl, slight liver edge Extremities  nl Neuro:nl  Lab Results: Lab Results  Component Value Date   WBC 3.3* 02/27/2011   HGB 12.6 02/27/2011   HCT 36.5 02/27/2011   MCV 105.5* 02/27/2011   PLT 97* 02/27/2011     Chemistry      Component Value Date/Time   NA 135 02/27/2011 1505   K 3.9 02/27/2011 1505   CL 102 02/27/2011 1505   CO2 24 02/27/2011 1505   BUN 13 02/27/2011 1505   CREATININE 0.66 02/27/2011 1505      Component Value Date/Time   CALCIUM 9.2 02/27/2011 1505   ALKPHOS 115 02/27/2011 1505   AST 83* 02/27/2011 1505   ALT 74* 02/27/2011 1505   BILITOT 0.5 02/27/2011 1505       Radiological Studies: chest X-ray n/a Mammogram Recent study -NED Bone density Recent study shows improving density , now in osteopenic range  Impression and Plan: Doing well, I have offered other agents for flushing, she will let me know. F/u in 6 months. I ave recommended ongoing vitamin d supplementation.  More than 50% of the visit was spent in patient-related counselling   Eston Esters, MD 12/12/201210:05 PM

## 2011-04-26 ENCOUNTER — Encounter: Payer: Self-pay | Admitting: Gastroenterology

## 2011-07-24 ENCOUNTER — Other Ambulatory Visit: Payer: Self-pay | Admitting: Gastroenterology

## 2011-07-24 DIAGNOSIS — K7460 Unspecified cirrhosis of liver: Secondary | ICD-10-CM

## 2011-07-30 ENCOUNTER — Other Ambulatory Visit: Payer: Medicare Other

## 2011-08-02 ENCOUNTER — Ambulatory Visit
Admission: RE | Admit: 2011-08-02 | Discharge: 2011-08-02 | Disposition: A | Discharge status: Home / Self Care | Payer: Medicare Other | Source: Ambulatory Visit | Attending: Gastroenterology | Admitting: Gastroenterology

## 2011-08-02 DIAGNOSIS — K7460 Unspecified cirrhosis of liver: Secondary | ICD-10-CM

## 2011-08-29 ENCOUNTER — Other Ambulatory Visit: Payer: Self-pay | Admitting: Oncology

## 2011-08-29 DIAGNOSIS — Z853 Personal history of malignant neoplasm of breast: Secondary | ICD-10-CM

## 2011-09-05 ENCOUNTER — Ambulatory Visit
Admission: RE | Admit: 2011-09-05 | Discharge: 2011-09-05 | Disposition: A | Discharge status: Home / Self Care | Payer: Medicare Other | Source: Ambulatory Visit | Attending: Oncology | Admitting: Oncology

## 2011-09-05 DIAGNOSIS — Z853 Personal history of malignant neoplasm of breast: Secondary | ICD-10-CM

## 2011-10-02 ENCOUNTER — Other Ambulatory Visit (HOSPITAL_BASED_OUTPATIENT_CLINIC_OR_DEPARTMENT_OTHER): Payer: Medicare Other | Admitting: Lab

## 2011-10-02 DIAGNOSIS — C799 Secondary malignant neoplasm of unspecified site: Secondary | ICD-10-CM

## 2011-10-02 DIAGNOSIS — M899 Disorder of bone, unspecified: Secondary | ICD-10-CM

## 2011-10-02 DIAGNOSIS — E559 Vitamin D deficiency, unspecified: Secondary | ICD-10-CM

## 2011-10-02 DIAGNOSIS — C801 Malignant (primary) neoplasm, unspecified: Secondary | ICD-10-CM

## 2011-10-02 LAB — CBC WITH DIFFERENTIAL/PLATELET
Basophils Absolute: 0 10*3/uL (ref 0.0–0.1)
Eosinophils Absolute: 0.1 10*3/uL (ref 0.0–0.5)
HGB: 12.3 g/dL (ref 11.6–15.9)
NEUT#: 1.9 10*3/uL (ref 1.5–6.5)
RDW: 13.4 % (ref 11.2–14.5)
lymph#: 1.1 10*3/uL (ref 0.9–3.3)

## 2011-10-03 LAB — COMPREHENSIVE METABOLIC PANEL
AST: 51 U/L — ABNORMAL HIGH (ref 0–37)
BUN: 14 mg/dL (ref 6–23)
CO2: 25 mEq/L (ref 19–32)
Calcium: 9 mg/dL (ref 8.4–10.5)
Chloride: 104 mEq/L (ref 96–112)
Creatinine, Ser: 0.77 mg/dL (ref 0.50–1.10)

## 2011-10-03 LAB — VITAMIN D 25 HYDROXY (VIT D DEFICIENCY, FRACTURES): Vit D, 25-Hydroxy: 33 ng/mL (ref 30–89)

## 2011-10-09 ENCOUNTER — Ambulatory Visit (HOSPITAL_BASED_OUTPATIENT_CLINIC_OR_DEPARTMENT_OTHER): Payer: Medicare Other | Admitting: Oncology

## 2011-10-09 ENCOUNTER — Telehealth: Payer: Self-pay | Admitting: *Deleted

## 2011-10-09 ENCOUNTER — Other Ambulatory Visit: Payer: Self-pay | Admitting: *Deleted

## 2011-10-09 VITALS — BP 103/61 | HR 57 | Temp 98.2°F | Ht 62.0 in | Wt 154.6 lb

## 2011-10-09 DIAGNOSIS — Z170 Estrogen receptor positive status [ER+]: Secondary | ICD-10-CM

## 2011-10-09 DIAGNOSIS — C50919 Malignant neoplasm of unspecified site of unspecified female breast: Secondary | ICD-10-CM

## 2011-10-09 DIAGNOSIS — E559 Vitamin D deficiency, unspecified: Secondary | ICD-10-CM

## 2011-10-09 DIAGNOSIS — C799 Secondary malignant neoplasm of unspecified site: Secondary | ICD-10-CM

## 2011-10-09 DIAGNOSIS — M810 Age-related osteoporosis without current pathological fracture: Secondary | ICD-10-CM

## 2011-10-09 DIAGNOSIS — C50419 Malignant neoplasm of upper-outer quadrant of unspecified female breast: Secondary | ICD-10-CM

## 2011-10-09 MED ORDER — ALENDRONATE SODIUM 35 MG PO TABS: 35.0000 mg | ORAL_TABLET | ORAL | Status: DC

## 2011-10-09 MED ORDER — ANASTROZOLE 1 MG PO TABS: 1.0000 mg | ORAL_TABLET | Freq: Every day | ORAL | Status: DC

## 2011-10-09 NOTE — Patient Instructions (Addendum)
Increase vitamin D3 to 3 pills twice a  Day.. We will check you again in 1 year. We will do a bone density test then (2014) with a mammogram

## 2011-10-09 NOTE — Telephone Encounter (Signed)
Gave patient appointment for 09-2012 printed out calendar and gave to the patient

## 2011-10-09 NOTE — Progress Notes (Signed)
Hematology and Oncology Follow Up Visit  Beverly Davis [id number] 1940-10-21 71 y.o. 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 Hx of cirrhosis  Interim History:  Beverly Davis is doing well apart from hot flashes which are partially controlled by peridin c She denies joint pains.She has had recent mammogram and bone density tests.  Medications: I have reviewed the patient's current medications.  Allergies: No Known Allergies  Past Medical History, Surgical history, Social history, and Family History were reviewed and updated.  Review of Systems: Constitutional:  Negative for fever, chills, night sweats, anorexia, weight loss, pain. Cardiovascular: no chest pain or dyspnea on exertion Respiratory: no cough, shortness of breath, or wheezing Neurological: no TIA or stroke symptoms Dermatological: negative ENT: negative Skin Gastrointestinal: no abdominal pain, change in bowel habits, or black or bloody stools Genito-Urinary: no dysuria, trouble voiding, or hematuria Hematological and Lymphatic: negative Breast: negative for breast lumps Musculoskeletal: negative Remaining ROS negative.  Physical Exam: Blood pressure 103/61, pulse 57, temperature 98.2 F (36.8 C), height 5' 2"  (1.575 m), weight 154 lb 9.6 oz (70.126 kg). ECOG: 0 General appearance: alert, cooperative and appears stated age Head: Normocephalic, without obvious abnormality, atraumatic Neck: no adenopathy, no carotid bruit, no JVD, supple, symmetrical, trachea midline and thyroid not enlarged, symmetric, no tenderness/mass/nodules Lymph nodes: Cervical, supraclavicular, and axillary nodes normal. Cardiac : normal Pulmonary:normal Breasts: rt and left breast are free of masses, no nipple retraction or skin changes apart from radiation  related. Both axillae are negative. Abdomen:nl, slight liver edge Extremities  nl Neuro:nl  Lab Results: Lab Results  Component Value Date   WBC 3.4* 10/02/2011   HGB 12.3 10/02/2011   HCT 36.2 10/02/2011   MCV 105.5* 10/02/2011   PLT 103* 10/02/2011     Chemistry      Component Value Date/Time   NA 136 10/02/2011 1444   K 4.1 10/02/2011 1444   CL 104 10/02/2011 1444   CO2 25 10/02/2011 1444   BUN 14 10/02/2011 1444   CREATININE 0.77 10/02/2011 1444      Component Value Date/Time   CALCIUM 9.0 10/02/2011 1444   ALKPHOS 102 10/02/2011 1444   AST 51* 10/02/2011 1444   ALT 45* 10/02/2011 1444   BILITOT 0.7 10/02/2011 1444       Radiological Studies: chest X-ray n/a Mammogram Recent study -NED Bone density Recent study shows improving density , now in osteopenic range  Impression and Plan: 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 We will see her in a years time. I've recommended ongoing treatment with AI. We will check a follow bone density test next fall. Currently your vitamin D level is somewhat low despite taking vitamin D have recommended increasing the dose. I have given her followup information as well as discussion today about her BMI. More than 50% of the visit was spent in patient-related counselling   Eston Esters, MD 7/17/20134:17 PM

## 2012-04-08 ENCOUNTER — Telehealth: Payer: Self-pay | Admitting: Oncology

## 2012-04-08 NOTE — Telephone Encounter (Signed)
Pt called asking to see Dr. Jana Hakim when she is suppose to come in for her yearly appt.   Documented on list.

## 2012-06-15 ENCOUNTER — Encounter: Payer: Self-pay | Admitting: Oncology

## 2012-06-22 ENCOUNTER — Telehealth: Payer: Self-pay | Admitting: *Deleted

## 2012-06-22 NOTE — Telephone Encounter (Signed)
Left message for pt to return my call so I can schedule her an appt w/ Dr. Jana Hakim in July.

## 2012-06-24 ENCOUNTER — Telehealth: Payer: Self-pay | Admitting: *Deleted

## 2012-06-24 ENCOUNTER — Encounter: Payer: Self-pay | Admitting: Oncology

## 2012-06-24 NOTE — Telephone Encounter (Signed)
Pt returned my call and I confirmed 09/30/12 appt w/ pt.  Mailed letter & calendar to pt.

## 2012-06-24 NOTE — Telephone Encounter (Signed)
Left message for pt to return my call so I can get her rescheduled w/ Dr. Jana Hakim since Dr. Truddie Coco is no longer here

## 2012-09-30 ENCOUNTER — Telehealth: Payer: Self-pay | Admitting: *Deleted

## 2012-09-30 ENCOUNTER — Ambulatory Visit (HOSPITAL_BASED_OUTPATIENT_CLINIC_OR_DEPARTMENT_OTHER): Payer: Medicare Other | Admitting: Family

## 2012-09-30 ENCOUNTER — Encounter: Payer: Self-pay | Admitting: Family

## 2012-09-30 VITALS — BP 121/69 | HR 57 | Temp 98.2°F | Resp 20 | Ht 62.0 in | Wt 153.4 lb

## 2012-09-30 DIAGNOSIS — M899 Disorder of bone, unspecified: Secondary | ICD-10-CM

## 2012-09-30 DIAGNOSIS — C50911 Malignant neoplasm of unspecified site of right female breast: Secondary | ICD-10-CM

## 2012-09-30 DIAGNOSIS — Z853 Personal history of malignant neoplasm of breast: Secondary | ICD-10-CM | POA: Insufficient documentation

## 2012-09-30 DIAGNOSIS — M8580 Other specified disorders of bone density and structure, unspecified site: Secondary | ICD-10-CM

## 2012-09-30 NOTE — Telephone Encounter (Signed)
appts made and printed...td 

## 2012-09-30 NOTE — Progress Notes (Addendum)
[place]  Telephone:([phone]) [phone] [phone]) [phone]  OFFICE PROGRESS NOTE   ID: YARITZA LEIST   DOB: 1941-03-20  MR#: [id number]  OEV#:[id number]   PCP: Unk Pinto, M.D. RAD ONC: Arloa Koh, M.D.   HISTORY OF PRESENT ILLNESS: From Dr. Collier Salina Rubin's new patient evaluation note dated 07/16/2006: "This is a delightful 72 year old woman from [place], here today with daughter-in-law, Caryl Pina, to discuss adjuvant treatment of breast cancer.  This woman has been in relatively good health.  She really has no significant active medical problems.  She has undergone regular screening mammography on an annual basis.  Mammogram performed on 04/15/06 at [facility] showed interval appearance of right breast nodular density.  Follow up views were recommended.  Right breast mammogram and ultrasound was performed on 05/01/06, showed a mass in the upper outer quadrant of the right breast, two masses seen at 10 o'clock position right breast 10 cm from the nipple, one appeared solid with cystic components and the other one is more solid.  Biopsy was recommended.  The lesion at 10 o'clock measured 13 x 11 x 12 mm.  Second hypoechoic lesion measured 11 x 7.0 x 9.0 mm.  Both lesions were biopsied on 05/29/06.  The lesion on the right side was invasive mammary carcinoma as was the one at 10 o'clock.  The lesion at 10 o'clock was ER/PR positive at 99 and 94% respectively.  Proliferative index was 26%, HER-2 was 1+.  Bilateral MRI scans on 06/05/06 showed two neoplastic lesions upper outer quadrant, all of which span an area of 3.0 cm.  The left breast was benign.  Patient underwent a needle-localized lumpectomy on 06/25/06.  Final pathology showed two separate lesions measuring 1.2 and 0.9 cm with superior margin involved by invasive cancer.  Additional tissue removed from tail of breast showed invasive ductal cancer measuring 0.5 cm in greatest dimension.  This was felt to be contiguous  with the second lesion.  Margins were not involved.  It was felt that all margins were clear at that point.  A sentinel lymph node was identified and was negative for malignancy.  Again, these lesions were highly PR positive.  Lymphovascular invasion was not seen.  Proliferative index as noted before.  Patient has had an unremarkable postoperative course."  Her subsequent history is as detailed below.   INTERVAL HISTORY: Dr. 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  The patient was last seen by Dr. Truddie Coco on 10/09/2011.  Since her last office visit, the patient has been doing relatively well.  She is establishing herself with Dr. Virgie Dad service today.   REVIEW OF SYSTEMS: A 10 point review of systems was completed and is negative except 2 episodes of nausea with emesis at the end of 2013.  The patient denies any other symptomatology.   PAST MEDICAL HISTORY: Past Medical History  Diagnosis Date  . Breast cancer 05/2006    Right breast cancer  . Thyroid disease   . Diabetes mellitus without complication   . Cirrhosis   . Osteopenia   . Fibroids   Significant for hypothyroidism and the diagnosis of type 2 diabetes.   PAST SURGICAL HISTORY: Past Surgical History  Procedure Laterality Date  . Breast lumpectomy with axillary lymph node biopsy Right 06/25/2006  . Cholecystectomy    . Abdominal hysterectomy      TAH BSO  . Tubal ligation  1978  FAMILY HISTORY Family History  Problem Relation Age of Onset  . Cancer Mother     Brain cancer  . Cancer Father     Prostate cancer  . Diabetes Sister   . Hypertension Sister   . Liver disease Brother   . Diabetes Maternal Aunt   . Heart Problems Maternal Grandfather   . Cancer Paternal Grandfather     Unknown cancer  . Diabetes Sister   Her mother and one of the seven children had cancer and not sure what type, may have been brain cancer.  Father died of  complications of prostate cancer.  He had one sister with possible history of breast cancer.  The patient herself has four sisters and one brother all of whom are in good health.   GYNECOLOGIC HISTORY: Gravida 1, para 1,  menarche at age 18, parity age 70, she had hysterectomy in 2004 for fibroids,  no history of hormone replacement therapy.  SOCIAL HISTORY: Mrs. Miano has married to her husband Kyung Rudd since 42.  They have one adult son and 2 grandchildren who live in [place], [place].  Her husband is retired from a [employer] and she is retired from an IT consultant as a Marketing executive.  In her spare time she watches her grandkids for her son, sleeps, attends church services, and likes dining out.   ADVANCED DIRECTIVES: Not on file  HEALTH MAINTENANCE: History  Substance Use Topics  . Smoking status: Never Smoker   . Smokeless tobacco: Never Used  . Alcohol Use: No    Colonoscopy: Not on file PAP: Not on file Bone density:  The patient's last bone density scan on 12/12/2010 showed a T score of -1.9 (osteopenia). Lipid panel: Not on file  No Known Allergies  Current Outpatient Prescriptions  Medication Sig Dispense Refill  . calcium carbonate (OS-CAL) 600 MG TABS Take 600 mg by mouth 2 (two) times daily with a meal.        . Cholecalciferol (VITAMIN D) 2000 UNITS tablet Take 4,000 Units by mouth 2 (two) times daily.      . Flaxseed, Linseed, 1300 MG CAPS Take 1 capsule by mouth 2 (two) times daily.      Marland Kitchen glipiZIDE (GLUCOTROL) 5 MG tablet Take 5 mg by mouth 3 (three) times daily before meals.       Marland Kitchen levothyroxine (SYNTHROID, LEVOTHROID) 150 MCG tablet Take 150 mcg by mouth daily before breakfast.      . Omega-3 Fatty Acids (FISH OIL) 1200 MG CAPS Take 1 capsule by mouth 2 (two) times daily.       No current facility-administered medications for this visit.    OBJECTIVE: Filed Vitals:   09/30/12 1456  BP: 121/69  Pulse: 57  Temp: 98.2 F (36.8 C)   Resp: 20     Body mass index is 28.05 kg/(m^2).      ECOG FS: 0 - Asymptomatic  General appearance: Alert, cooperative, well nourished, no apparent distress Head: Normocephalic, without obvious abnormality, atraumatic Eyes: Conjunctivae/corneas clear, PERRLA, EOMI Nose: Nares, septum and mucosa are normal, no drainage or sinus tenderness Neck: No adenopathy, supple, symmetrical, trachea midline, thyroid not enlarged, no tenderness Resp: Clear to auscultation bilaterally Cardio: Regular rate and rhythm, S1, S2 normal, no murmur, click, rub or gallop Breasts: Right breast has a well-healed surgical scar, glandular breast tissue bilaterally, firm inframammary and medial mammary ridges, no nipple inversion, bilateral axillary fullness GI: Soft, distended, non-tender, hypoactive bowel sounds, no organomegaly Skin: Several nevi  on trunk area and bilateral upper/lower extremities Extremities: Extremities normal, atraumatic, no cyanosis or edema Lymph nodes: Cervical, supraclavicular, and axillary nodes normal Neurologic: Grossly normal   LAB RESULTS: Lab Results  Component Value Date   WBC 3.4* 10/02/2011   NEUTROABS 1.9 10/02/2011   HGB 12.3 10/02/2011   HCT 36.2 10/02/2011   MCV 105.5* 10/02/2011   PLT 103* 10/02/2011      Chemistry      Component Value Date/Time   NA 136 10/02/2011 1444   K 4.1 10/02/2011 1444   CL 104 10/02/2011 1444   CO2 25 10/02/2011 1444   BUN 14 10/02/2011 1444   CREATININE 0.77 10/02/2011 1444      Component Value Date/Time   CALCIUM 9.0 10/02/2011 1444   ALKPHOS 102 10/02/2011 1444   AST 51* 10/02/2011 1444   ALT 45* 10/02/2011 1444   BILITOT 0.7 10/02/2011 1444       Lab Results  Component Value Date   LABCA2 36 02/27/2011    Urinalysis No results found for this basename: colorurine,  appearanceur,  labspec,  phurine,  glucoseu,  hgbur,  bilirubinur,  ketonesur,  proteinur,  urobilinogen,  nitrite,  leukocytesur    STUDIES: 1.  The patient's last  bilateral digital diagnostic mammogram on 09/05/2011 showed no mammographic evidence of local recurrence, right breast.  No mammographic evidence of malignancy, left breast.  2 .  The patient's last bone density scan on 12/12/2010 showed a T score of -1.9 (osteopenia).    ASSESSMENT: 73 y.o. woman: 1.  Status post right breast needle core biopsy at the 10 o'clock position on 05/29/2006 which showed invasive mammary carcinoma is consistent with invasive ductal carcinoma and high-grade ductal carcinoma in situ, estrogen receptor a 99% positive, progesterone receptor 94% positive, Ki-67 26%, HER-2/neu negative at 1+.  2.  Status post bilateral breast MRI on 05/26/2006 which showed 2 areas of early post-contrast enhancement noted in the middle and posterior of the upper outer right breast.  The smaller and slightly more anterior nodule in the right breast measured 1.1 cm and the larger and slightly more posterior nodule measured 1.3 cm.  Measuring across both of the lesions, the total length is 2.7 cm.  A finger-like projection extended anterior and lateral from the larger nodule, to the skin surface approximately 3.3 cm.  Both nodules were associated with stippled surrounding post-contrast enhancement.  Throughout the remainder of the right breast there are subtle stippled areas of enhancement without additional focus of mass-like enhancement.  The left breast had a normal nipple areola complex.  No skin thickening.  No dominant cyst.  Stable tiny cysts throughout the parenchyma.  Mild ductal ectasia was suggested.  Stippled progressive foci of post-contrast enhancement without hypervascular mass-type enhancement.  Tiny lymph nodes in the lower outer quadrant posterior depth.  Bilateral adenosis and minimal fibrocystic change.  Hiatal hernia seen at the film edge.  3.  Status post right breast needle localized lumpectomy with right axillary lymph node biopsy on 06/25/2006 for a stage I, pT1c, pN0, pMX, 2  foci of invasive and in situ ductal carcinoma 1.2 cm and 0.9 cm and tail of the breast which showed invasive ductal carcinoma 0.5 cm, estrogen receptor 99% positive, progesterone receptor 94% positive, Ki-67 26%, HER-2/neu negative, with 0/1 metastatic right axillary lymph nodes.  4.  Oncotype DX report dated 07/25/2006 showed her recurrence score of 15 with average rate of distant recurrence at 10%.  5.  Status post radiation therapy from 08/19/2006  through 10/06/2006.  6.  The patient started antiestrogen therapy with Tamoxifen in 09/2006.  Antiestrogen therapy was changed to Anastrozole in 11/2008.  PLAN: The patient is nearing 6 years from her time of diagnosis and will officially become a graduate of [facility]'s breast cancer program today. We asked that she continue annual clinical breast examinations by a physician in addition to annual mammography.  Her last mammogram results are listed above and we will schedule her for a mammogram in the near future, as it is currently due.  We will also schedule a bone density examination for her in 11/2012.  Antiestrogen therapy with Anastrozole and therapy with Alendronate was discontinued today by Dr. Jana Hakim.   All questions were answered.  The patient was encouraged to contact us with any problems, questions or concerns.   Ailene Ards, NP-C 09/30/2012, 4:52 PM  ADDENDUM: I discussed take these diagnoses, treatment history and prognosis at length with her today. In brief: This 72 year-old [place] woman underwent right lumpectomy and sentinel lymph node sampling April of 2008 for an mpT1c pN0, stage IA invasive ductal carcinoma, which was estrogen and progesterone receptor positive, with no HER-2 amplification, and an MIB-1 of 26%. She had radiation adjuvantly completed in July of 2008 followed by tamoxifen between July 2008 and September of 2010, when she started anastrozole, on which she continues.  Her Oncotype DX showed a score of 15,  predicting a 10% risk of distant recurrence within 10 years if the patient's only adjuvant therapy was tamoxifen for 5 years. However, since Alexandre took tamoxifen for 2 years followed by an aromatase inhibitor for 4, her risk of recurrence is likely 3% less than that, or about 7%. In addition, since 6 years have passed with no recurrence her actual risk of recurrence at this point is going to be less than 5%.  Accordingly I am comfortable releasing her to her primary care physician. She is a bit behind on her mammography and this is being set up for her. She will need yearly mammogram and yearly physician breast exam which she can obtain to her primary care physician. She knows we will be glad to see her again in the future as needed, but as of now no further appointments are being made for her here.  I personally saw this patient and performed a substantive portion of this encounter with the listed APP documented above.   Chauncey Cruel, MD

## 2012-09-30 NOTE — Patient Instructions (Signed)
Please contact us at [phone] if you have any questions or concerns.  Please continue to do well and enjoy life!!!  Get plenty of rest, drink plenty of water, exercise daily (walking), eat a balanced diet.  Continue to take calcium daily and vitamin D3 daily.   Complete monthly self-breast examinations.  Have a clinical breast exam by a physician every year.  Have your mammogram completed every year.

## 2012-10-07 ENCOUNTER — Other Ambulatory Visit: Payer: Medicare Other | Admitting: Lab

## 2012-10-14 ENCOUNTER — Ambulatory Visit
Admission: RE | Admit: 2012-10-14 | Discharge: 2012-10-14 | Disposition: A | Discharge status: Home / Self Care | Payer: Medicare Other | Source: Ambulatory Visit | Attending: Family | Admitting: Family

## 2012-10-14 ENCOUNTER — Ambulatory Visit: Payer: Medicare Other | Admitting: Oncology

## 2012-10-14 DIAGNOSIS — Z853 Personal history of malignant neoplasm of breast: Secondary | ICD-10-CM

## 2012-10-21 ENCOUNTER — Telehealth: Payer: Self-pay | Admitting: *Deleted

## 2012-10-21 NOTE — Telephone Encounter (Signed)
Patient left voicemail message returning [facility] LPN message. Voicemil forwarded to the MD desk RN

## 2012-10-23 ENCOUNTER — Telehealth: Payer: Self-pay | Admitting: *Deleted

## 2012-10-23 NOTE — Telephone Encounter (Signed)
Pt left message stating " I received a call from a Robinson but I haven't heard back again and now I can't get in to speak to anyone ...something about an xray so I don't know "  Return call number left as [phone].  This RN returned call and obtained answering machine, this RN name and office number left for a return call.

## 2012-12-16 ENCOUNTER — Ambulatory Visit
Admission: RE | Admit: 2012-12-16 | Discharge: 2012-12-16 | Disposition: A | Discharge status: Home / Self Care | Payer: Medicare Other | Source: Ambulatory Visit | Attending: Family | Admitting: Family

## 2012-12-16 DIAGNOSIS — Z853 Personal history of malignant neoplasm of breast: Secondary | ICD-10-CM

## 2012-12-16 DIAGNOSIS — M8580 Other specified disorders of bone density and structure, unspecified site: Secondary | ICD-10-CM

## 2012-12-17 ENCOUNTER — Telehealth: Payer: Self-pay | Admitting: Family

## 2012-12-17 NOTE — Telephone Encounter (Signed)
LMOVM that recent bone density scan showed a T score of -2.3 (osteopenia), which is slightly progressed since her last bone density scan.  Patient he should talk to her primary care physician about continuing Fosamax in addition to calcium and vitamin D daily.

## 2012-12-22 ENCOUNTER — Telehealth: Payer: Self-pay | Admitting: *Deleted

## 2012-12-22 ENCOUNTER — Other Ambulatory Visit: Payer: Self-pay | Admitting: *Deleted

## 2012-12-22 MED ORDER — ALENDRONATE SODIUM 35 MG PO TABS: 35.0000 mg | ORAL_TABLET | ORAL | Status: DC

## 2012-12-22 NOTE — Telephone Encounter (Signed)
This RN reviewed with pt need to resume alendronate for low bone mass- note pt was on while taking anastrazole but was told she could stop when she completed therapy in July 2014.  Pt sees Dr Wayland Denis for primary care but is not scheduled for visit until later in year.  Per discussion this RN will call in prescription with refills until she is seen by her primary MD.  Note this RN also verified cost for pt due to no prescription coverage benefit- with [facility] being the least expensive.  Questions answered reqarding taking the alendronate weekly on an empty stomach and maintaining an upright position for 30 minutes to decrease possible gastritis as well as concern due to other am medications she has to take on an empty stomach.  Plan is pt will wake up earlier one day a week and take usual am meds then wait at least 30 minutes and take the alendronate.  No other needs at this time. Pt verbalized appreciation.

## 2012-12-25 ENCOUNTER — Other Ambulatory Visit: Payer: Self-pay | Admitting: *Deleted

## 2012-12-25 MED ORDER — ALENDRONATE SODIUM 35 MG PO TABS: 35.0000 mg | ORAL_TABLET | ORAL | Status: DC

## 2013-01-07 ENCOUNTER — Telehealth: Payer: Self-pay | Admitting: *Deleted

## 2013-01-07 NOTE — Telephone Encounter (Signed)
Message left

## 2013-02-02 ENCOUNTER — Telehealth: Payer: Self-pay | Admitting: *Deleted

## 2013-02-02 MED ORDER — GLIPIZIDE 5 MG PO TABS: 5.0000 mg | ORAL_TABLET | Freq: Three times a day (TID) | ORAL | Status: DC

## 2013-02-02 NOTE — Telephone Encounter (Signed)
Patient request refill on Glipizide 5 mg, ok per Estill Bamberg , patient aware of Janumet instructions

## 2013-02-02 NOTE — Telephone Encounter (Signed)
PT HAS QUESTIONS ON NEW BS MEDS IS NEEDING TO SPEAK WITH DONNA

## 2013-02-02 NOTE — Telephone Encounter (Signed)
Return patients call, lmom

## 2013-03-09 ENCOUNTER — Encounter: Payer: Self-pay | Admitting: Internal Medicine

## 2013-03-09 DIAGNOSIS — E079 Disorder of thyroid, unspecified: Secondary | ICD-10-CM

## 2013-03-09 DIAGNOSIS — M8580 Other specified disorders of bone density and structure, unspecified site: Secondary | ICD-10-CM

## 2013-03-09 DIAGNOSIS — M810 Age-related osteoporosis without current pathological fracture: Secondary | ICD-10-CM | POA: Insufficient documentation

## 2013-03-09 DIAGNOSIS — E119 Type 2 diabetes mellitus without complications: Secondary | ICD-10-CM

## 2013-03-10 ENCOUNTER — Ambulatory Visit: Payer: Self-pay | Admitting: Emergency Medicine

## 2013-03-26 ENCOUNTER — Other Ambulatory Visit: Payer: Self-pay | Admitting: Internal Medicine

## 2013-03-29 ENCOUNTER — Other Ambulatory Visit: Payer: Self-pay | Admitting: Physician Assistant

## 2013-04-03 ENCOUNTER — Other Ambulatory Visit: Payer: Self-pay | Admitting: Internal Medicine

## 2013-04-05 ENCOUNTER — Telehealth: Payer: Self-pay | Admitting: *Deleted

## 2013-04-05 NOTE — Telephone Encounter (Signed)
Patient called. States she was exposed to flu by grandson and asked for RX of Tamiflu.  Per Dr Melford Aase, the side effects of Tamiflu outweigh the benefit of the drug and he does not RX it.  Patient advised to call if she develops symptoms

## 2013-06-01 ENCOUNTER — Encounter: Payer: Self-pay | Admitting: Physician Assistant

## 2013-06-21 ENCOUNTER — Ambulatory Visit (INDEPENDENT_AMBULATORY_CARE_PROVIDER_SITE_OTHER): Payer: Medicare HMO | Admitting: Physician Assistant

## 2013-06-21 ENCOUNTER — Encounter: Payer: Self-pay | Admitting: Physician Assistant

## 2013-06-21 VITALS — BP 122/64 | HR 72 | Temp 98.1°F | Resp 16 | Ht 62.0 in | Wt 151.0 lb

## 2013-06-21 DIAGNOSIS — M79672 Pain in left foot: Secondary | ICD-10-CM

## 2013-06-21 DIAGNOSIS — M25579 Pain in unspecified ankle and joints of unspecified foot: Secondary | ICD-10-CM

## 2013-06-21 DIAGNOSIS — M79609 Pain in unspecified limb: Secondary | ICD-10-CM

## 2013-06-21 DIAGNOSIS — M25572 Pain in left ankle and joints of left foot: Secondary | ICD-10-CM

## 2013-06-21 MED ORDER — MELOXICAM 15 MG PO TABS: ORAL_TABLET | ORAL | Status: DC

## 2013-06-21 NOTE — Patient Instructions (Signed)
Can take zyrtec/certizine at night or allegra/fexafinadine at night  Ankle Sprain An ankle sprain is an injury to the strong, fibrous tissues (ligaments) that hold the bones of your ankle joint together.  CAUSES An ankle sprain is usually caused by a fall or by twisting your ankle. Ankle sprains most commonly occur when you step on the outer edge of your foot, and your ankle turns inward. People who participate in sports are more prone to these types of injuries.  SYMPTOMS   Pain in your ankle. The pain may be present at rest or only when you are trying to stand or walk.  Swelling.  Bruising. Bruising may develop immediately or within 1 to 2 days after your injury.  Difficulty standing or walking, particularly when turning corners or changing directions. DIAGNOSIS  Your caregiver will ask you details about your injury and perform a physical exam of your ankle to determine if you have an ankle sprain. During the physical exam, your caregiver will press on and apply pressure to specific areas of your foot and ankle. Your caregiver will try to move your ankle in certain ways. An X-ray exam may be done to be sure a bone was not broken or a ligament did not separate from one of the bones in your ankle (avulsion fracture).  TREATMENT  Certain types of braces can help stabilize your ankle. Your caregiver can make a recommendation for this. Your caregiver may recommend the use of medicine for pain. If your sprain is severe, your caregiver may refer you to a surgeon who helps to restore function to parts of your skeletal system (orthopedist) or a physical therapist. Why ice to your injury for 1 2 days or as directed by your caregiver. Applying ice helps to reduce inflammation and pain.  Put ice in a plastic bag.  Place a towel between your skin and the bag.  Leave the ice on for 15-20 minutes at a time, every 2 hours while you are awake.  Only take over-the-counter or  prescription medicines for pain, discomfort, or fever as directed by your caregiver.  Elevate your injured ankle above the level of your heart as much as possible for 2 3 days.  If your caregiver recommends crutches, use them as instructed. Gradually put weight on the affected ankle. Continue to use crutches or a cane until you can walk without feeling pain in your ankle.  If you have a plaster splint, wear the splint as directed by your caregiver. Do not rest it on anything harder than a pillow for the first 24 hours. Do not put weight on it. Do not get it wet. You may take it off to take a shower or bath.  You may have been given an elastic bandage to wear around your ankle to provide support. If the elastic bandage is too tight (you have numbness or tingling in your foot or your foot becomes cold and blue), adjust the bandage to make it comfortable.  If you have an air splint, you may blow more air into it or let air out to make it more comfortable. You may take your splint off at night and before taking a shower or bath. Wiggle your toes in the splint several times per day to decrease swelling. SEEK MEDICAL CARE IF:   You have rapidly increasing bruising or swelling.  Your toes feel extremely cold or you lose feeling in your foot.  Your pain is not relieved with medicine.  SEEK IMMEDIATE MEDICAL CARE IF:  Your toes are numb or blue.  You have severe pain that is increasing. MAKE SURE YOU:   Understand these instructions.  Will watch your condition.  Will get help right away if you are not doing well or get worse. Document Released: 03/11/2005 Document Revised: 12/04/2011 Document Reviewed: 03/23/2011 [facility] Patient Information 2014 [place], [place].

## 2013-06-21 NOTE — Progress Notes (Signed)
   Subjective:    Patient ID: Beverly Davis, female    DOB: 10/04/1940, 73 y.o.   MRN: [id number]  HPI 73 y.o. patient was walking on the side walk and her ankle rolled 1-2 weeks age. She fell to right, has bruising on her right lateral thigh and some healing abrasion on right arm but she states her left foot hurts the most. She has been elevating, icing her left ankle. She was taking aleve. She states it is still swollen and she has pain on the top of her foot. Didn't hit her head, no LOC.   Review of Systems  Constitutional: Negative.   HENT: Negative.   Respiratory: Negative.   Cardiovascular: Negative.   Genitourinary: Negative.   Musculoskeletal: Positive for gait problem and joint swelling. Negative for arthralgias, back pain, myalgias, neck pain and neck stiffness.  Skin: Negative.        Objective:   Physical Exam  Constitutional: She is oriented to person, place, and time. She appears well-developed and well-nourished.  HENT:  Head: Normocephalic and atraumatic.  Right Ear: External ear normal.  Left Ear: External ear normal.  Mouth/Throat: Oropharynx is clear and moist.  Eyes: Conjunctivae and EOM are normal. Pupils are equal, round, and reactive to light.  Neck: Normal range of motion. Neck supple. No thyromegaly present.  Cardiovascular: Normal rate, regular rhythm and normal heart sounds.  Exam reveals no gallop and no friction rub.   No murmur heard. Pulmonary/Chest: Effort normal and breath sounds normal. No respiratory distress. She has no wheezes.  Abdominal: Soft. Bowel sounds are normal. She exhibits no distension and no mass. There is no tenderness. There is no rebound and no guarding.  Musculoskeletal:       Left ankle: She exhibits decreased range of motion and swelling. She exhibits no ecchymosis, no deformity, no laceration and normal pulse. Tenderness. Lateral malleolus tenderness found. Achilles tendon normal.  Negative drawer test, + lateral  malleous/distal fibula tenderness. Pain with flexion. Good pulses, strength, sensation distal.   Lymphadenopathy:    She has no cervical adenopathy.  Neurological: She is alert and oriented to person, place, and time. She displays normal reflexes. No cranial nerve deficit. Coordination normal.  Skin: Skin is warm and dry.  Psychiatric: She has a normal mood and affect.       Assessment & Plan:  Left ankle- get Xray, meloxicam, RICE Pending xray

## 2013-06-22 ENCOUNTER — Ambulatory Visit (HOSPITAL_COMMUNITY)
Admission: RE | Admit: 2013-06-22 | Discharge: 2013-06-22 | Disposition: A | Discharge status: Home / Self Care | Payer: Medicare FFS | Source: Ambulatory Visit | Attending: Physician Assistant | Admitting: Physician Assistant

## 2013-06-22 DIAGNOSIS — M25572 Pain in left ankle and joints of left foot: Secondary | ICD-10-CM

## 2013-06-22 DIAGNOSIS — M79609 Pain in unspecified limb: Secondary | ICD-10-CM | POA: Insufficient documentation

## 2013-06-22 DIAGNOSIS — M25476 Effusion, unspecified foot: Secondary | ICD-10-CM | POA: Insufficient documentation

## 2013-06-22 DIAGNOSIS — M25473 Effusion, unspecified ankle: Secondary | ICD-10-CM | POA: Insufficient documentation

## 2013-06-22 DIAGNOSIS — M79672 Pain in left foot: Secondary | ICD-10-CM

## 2013-06-22 DIAGNOSIS — M7730 Calcaneal spur, unspecified foot: Secondary | ICD-10-CM | POA: Insufficient documentation

## 2013-06-22 DIAGNOSIS — M25579 Pain in unspecified ankle and joints of unspecified foot: Secondary | ICD-10-CM | POA: Insufficient documentation

## 2013-07-07 ENCOUNTER — Encounter: Payer: Self-pay | Admitting: Physician Assistant

## 2013-07-07 ENCOUNTER — Ambulatory Visit (INDEPENDENT_AMBULATORY_CARE_PROVIDER_SITE_OTHER): Payer: Medicare HMO | Admitting: Physician Assistant

## 2013-07-07 VITALS — BP 128/72 | HR 68 | Temp 97.7°F | Resp 16 | Ht 62.0 in | Wt 151.0 lb

## 2013-07-07 DIAGNOSIS — E079 Disorder of thyroid, unspecified: Secondary | ICD-10-CM

## 2013-07-07 DIAGNOSIS — E559 Vitamin D deficiency, unspecified: Secondary | ICD-10-CM

## 2013-07-07 DIAGNOSIS — E119 Type 2 diabetes mellitus without complications: Secondary | ICD-10-CM

## 2013-07-07 DIAGNOSIS — Z1211 Encounter for screening for malignant neoplasm of colon: Secondary | ICD-10-CM

## 2013-07-07 DIAGNOSIS — Z79899 Other long term (current) drug therapy: Secondary | ICD-10-CM

## 2013-07-07 DIAGNOSIS — M8580 Other specified disorders of bone density and structure, unspecified site: Secondary | ICD-10-CM

## 2013-07-07 DIAGNOSIS — Z0000 Encounter for general adult medical examination without abnormal findings: Secondary | ICD-10-CM

## 2013-07-07 DIAGNOSIS — E785 Hyperlipidemia, unspecified: Secondary | ICD-10-CM

## 2013-07-07 LAB — CBC WITH DIFFERENTIAL/PLATELET
Basophils Absolute: 0 K/uL (ref 0.0–0.1)
Basophils Relative: 0 % (ref 0–1)
Eosinophils Absolute: 0.1 K/uL (ref 0.0–0.7)
Eosinophils Relative: 3 % (ref 0–5)
HCT: 36.3 % (ref 36.0–46.0)
Hemoglobin: 12.6 g/dL (ref 12.0–15.0)
Lymphocytes Relative: 31 % (ref 12–46)
Lymphs Abs: 0.9 K/uL (ref 0.7–4.0)
MCH: 35.1 pg — ABNORMAL HIGH (ref 26.0–34.0)
MCHC: 34.7 g/dL (ref 30.0–36.0)
MCV: 101.1 fL — ABNORMAL HIGH (ref 78.0–100.0)
Monocytes Absolute: 0.3 K/uL (ref 0.1–1.0)
Monocytes Relative: 10 % (ref 3–12)
Neutro Abs: 1.7 K/uL (ref 1.7–7.7)
Neutrophils Relative %: 56 % (ref 43–77)
Platelets: 100 K/uL — ABNORMAL LOW (ref 150–400)
RBC: 3.59 MIL/uL — ABNORMAL LOW (ref 3.87–5.11)
RDW: 13.9 % (ref 11.5–15.5)
WBC: 3 K/uL — ABNORMAL LOW (ref 4.0–10.5)

## 2013-07-07 LAB — LIPID PANEL
Cholesterol: 189 mg/dL (ref 0–200)
HDL: 57 mg/dL
LDL Cholesterol: 114 mg/dL — ABNORMAL HIGH (ref 0–99)
Total CHOL/HDL Ratio: 3.3 ratio
Triglycerides: 89 mg/dL
VLDL: 18 mg/dL (ref 0–40)

## 2013-07-07 LAB — HEPATIC FUNCTION PANEL
ALT: 30 U/L (ref 0–35)
AST: 39 U/L — ABNORMAL HIGH (ref 0–37)
Albumin: 3.5 g/dL (ref 3.5–5.2)
Alkaline Phosphatase: 91 U/L (ref 39–117)
Bilirubin, Direct: 0.2 mg/dL (ref 0.0–0.3)
Indirect Bilirubin: 0.7 mg/dL (ref 0.2–1.2)
Total Bilirubin: 0.9 mg/dL (ref 0.2–1.2)
Total Protein: 6.6 g/dL (ref 6.0–8.3)

## 2013-07-07 LAB — TSH: TSH: 0.608 u[IU]/mL (ref 0.350–4.500)

## 2013-07-07 LAB — BASIC METABOLIC PANEL WITHOUT GFR
BUN: 15 mg/dL (ref 6–23)
CO2: 27 meq/L (ref 19–32)
Calcium: 9.3 mg/dL (ref 8.4–10.5)
Chloride: 105 meq/L (ref 96–112)
Creat: 0.81 mg/dL (ref 0.50–1.10)
GFR, Est African American: 85 mL/min
GFR, Est Non African American: 73 mL/min
Glucose, Bld: 80 mg/dL (ref 70–99)
Potassium: 3.9 meq/L (ref 3.5–5.3)
Sodium: 140 meq/L (ref 135–145)

## 2013-07-07 LAB — MAGNESIUM: Magnesium: 1.6 mg/dL (ref 1.5–2.5)

## 2013-07-07 MED ORDER — SITAGLIPTIN PHOS-METFORMIN HCL 50-500 MG PO TABS: 1.0000 | ORAL_TABLET | Freq: Two times a day (BID) | ORAL | Status: DC

## 2013-07-07 NOTE — Progress Notes (Signed)
Subjective:   Beverly Davis is a 73 y.o. female who presents for Medicare Annual Wellness Visit and complete physical.    Date of last medicare wellness visit is unknown.  Her blood pressure has been controlled at home, today their BP is BP: 128/72 mmHg She does not workout but she babysits her grandkids age 28 and 79. She denies chest pain, shortness of breath, dizziness.  She is not on cholesterol medication. Her cholesterol is not at goal but she refuses statins. The cholesterol last visit was:  LDL 95 She has been working on diet and exercise for diabetes,  Running 100-140 in the morning, she uses a freestyle "freedom" and denies polydipsia, polyuria and visual disturbances. Last A1C in the office was: 8.9. Some tingling in bilateral feet.  Patient is on Vitamin D supplement.  Vitamin D 43 She is on thyroid medication. Her medication was changed last visit, she is on 195mg, 1/2 pill Sunday and 1 the rest. Patient denies diarrhea, heat / cold intolerance and nervousness.  She has been on Fosamax since 2008 for osteoporosis.   Names of Other Physician/Practitioners you currently use: 1. [facility]- here for primary care 2. Dr. ETyna Jaksch eye doctor, last visit 1 year 3. Dr. KNolon Lennert dentist, last visit 1-2 months ago.  Dr. HBenson NorwayDr. KDeatra InaDr. RAudelia HivesPatient Care Team: WUnk Pinto MD as PCP - General (Internal Medicine)   Medication Review Current Outpatient Prescriptions on File Prior to Visit  Medication Sig Dispense Refill  . alendronate (FOSAMAX) 35 MG tablet Take 1 tablet (35 mg total) by mouth every 7 (seven) days. Take with full glass of water.  4 tablet  6  . calcium carbonate (OS-CAL) 600 MG TABS Take 600 mg by mouth 2 (two) times daily with a meal.        . Cholecalciferol (VITAMIN D) 2000 UNITS tablet Take 4,000 Units by mouth 2 (two) times daily.      . Flaxseed, Linseed, 1300 MG CAPS Take 1 capsule by mouth 2 (two) times daily.       .Marland KitchenglipiZIDE (GLUCOTROL) 5 MG tablet Take 1 tablet (5 mg total) by mouth 3 (three) times daily before meals.  270 tablet  1  . levothyroxine (SYNTHROID, LEVOTHROID) 150 MCG tablet TAKE ONE TABLET BY MOUTH EVERY DAY  90 tablet  0  . meloxicam (MOBIC) 15 MG tablet Once daily with food for pain  90 tablet  0  . nadolol (CORGARD) 40 MG tablet Take 40 mg by mouth daily.      . Omega-3 Fatty Acids (FISH OIL) 1200 MG CAPS Take 1 capsule by mouth 2 (two) times daily.      . sitaGLIPtin-metformin (JANUMET) 50-500 MG per tablet Take 1 tablet by mouth 2 (two) times daily with a meal.       No current facility-administered medications on file prior to visit.    Current Problems (verified) Patient Active Problem List   Diagnosis Date Noted  . Thyroid disease   . Diabetes mellitus without complication   . Osteopenia   . Breast cancer 09/30/2012  . Hot flashes 03/06/2011    Screening Tests Health Maintenance  Topic Date Due  . Colonoscopy  10/08/1991  . Zostavax  10/07/2001  . Pneumococcal Polysaccharide Vaccine Age 2652And Over  10/08/2006  . Influenza Vaccine  10/23/2013  . Mammogram  10/15/2014  . Tetanus/tdap  06/11/2022    Immunization History  Administered Date(s) Administered  . Influenza Split 03/06/2011,  01/06/2013  . Tdap 06/10/2012    Preventative care: Last colonoscopy: 2006 due 2016 Last mammogram: 09/2012 Last pap smear/pelvic exam: remote DEXA: 11/2012 due next year  Prior vaccinations: TD or Tdap: 05/2012  Influenza: 2014 Pneumococcal: we are out in the office Shingles/Zostavax: too expenisve  History reviewed: allergies, current medications, past family history, past medical history, past social history, past surgical history and problem list  Risk Factors: Osteoporosis: postmenopausal estrogen deficiency and dietary calcium and/or vitamin D deficiency History of fracture in the past year: no  Tobacco History  Substance Use Topics  . Smoking status: Never  Smoker   . Smokeless tobacco: Never Used  . Alcohol Use: No   She does not smoke.  Patient is not a former smoker. Are there smokers in your home (other than you)?  No  Alcohol Current alcohol use: none  Caffeine Current caffeine use: coffee 1-2 /day  Exercise Current exercise habits: The patient does not participate in regular exercise at present.  Current exercise: playing with her grandkids  Nutrition/Diet Current diet: in general, a "healthy" diet    Cardiac risk factors: advanced age (older than 47 for men, 100 for women), diabetes mellitus, dyslipidemia, family history of premature cardiovascular disease, hypertension and obesity (BMI >= 30 kg/m2).  Depression Screen (Note: if answer to either of the following is "Yes", a more complete depression screening is indicated)   Q1: Over the past two weeks, have you felt down, depressed or hopeless? No  Q2: Over the past two weeks, have you felt little interest or pleasure in doing things? No  Have you lost interest or pleasure in daily life? No  Do you often feel hopeless? No  Do you cry easily over simple problems? No  Activities of Daily Living In your present state of health, do you have any difficulty performing the following activities?:  Driving? No Managing money?  No Feeding yourself? No Getting from bed to chair? No Climbing a flight of stairs? No Preparing food and eating?: No Bathing or showering? No Getting dressed: No Getting to the toilet? No Using the toilet:No Moving around from place to place: No In the past year have you fallen or had a near fall?:yes, once   Are you sexually active?  No  Do you have more than one partner?  No  Vision Difficulties: No  Hearing Difficulties: No Do you often ask people to speak up or repeat themselves? No Do you experience ringing or noises in your ears? No Do you have difficulty understanding soft or whispered voices? No  Cognition  Do you feel that you have a  problem with memory?No  Do you often misplace items? No  Do you feel safe at home?  Yes  Advanced directives Does patient have a [place]? No Does patient have a Living Will? No    Objective:   Blood pressure 128/72, pulse 68, temperature 97.7 F (36.5 C), resp. rate 16, height 5' 2"  (1.575 m), weight 151 lb (68.493 kg). Body mass index is 27.61 kg/(m^2).  General appearance: alert, no distress, WD/WN,  female Cognitive Testing  Alert? Yes  Normal Appearance?Yes  Oriented to person? Yes  Place? Yes   Time? Yes  Recall of three objects?  Yes  Can perform simple calculations? Yes  Displays appropriate judgment?Yes  Can read the correct time from a watch face?Yes  General appearance: alert and mildly obese Head: Normocephalic, without obvious abnormality, atraumatic Eyes: conjunctivae/corneas clear. PERRL, EOM's intact. Fundi  benign. Ears: normal TM's and external ear canals both ears Nose: Nares normal. Septum midline. Mucosa normal. No drainage or sinus tenderness. Throat: lips, mucosa, and tongue normal; teeth and gums normal Neck: no adenopathy, no carotid bruit, no JVD, supple, symmetrical, trachea midline and thyroid not enlarged, symmetric, no tenderness/mass/nodules Lungs: clear to auscultation bilaterally Breasts: normal appearance, no masses or tenderness, defer Dr. Valentino Hue Heart: regular rate and rhythm, S2: increased intensity and systolic murmur: late systolic 3/6, decrescendo at 2nd left intercostal space Abdomen: soft, non-tender; bowel sounds normal; no masses,  no organomegaly and obese Extremities: extremities normal, atraumatic, no cyanosis or edema and no ulcers, gangrene or trophic changes Pulses: 2+ and symmetric Skin: Skin color, texture, turgor normal. No rashes or lesions Lymph nodes: Cervical, supraclavicular, and axillary nodes normal. Neurologic: Grossly normal   Assessment:   1. Thyroid disease - TSH  2. Diabetes mellitus  without complication - CBC with Differential - BASIC METABOLIC PANEL WITH GFR - Hepatic function panel - Hemoglobin A1c - Urinalysis, Routine w reflex microscopic - Microalbumin / creatinine urine ratio - EKG 12-Lead - [place], RETROPERITNL ABD,  LTD - HM DIABETES FOOT EXAM  3. Osteopenia  4. Other and unspecified hyperlipidemia - Lipid panel  5. Unspecified vitamin D deficiency - Vit D  25 hydroxy (rtn osteoporosis monitoring)  6. Encounter for long-term (current) use of other medications - Magnesium  7. Colon cancer screening - POC Hemoccult Bld/Stl (3-Cd Home Screen); Future   Plan:   During the course of the visit the patient was educated and counseled about appropriate screening and preventive services including:    Pneumococcal vaccine   Influenza vaccine  Td vaccine  Screening electrocardiogram  Screening mammography  Bone densitometry screening  Colorectal cancer screening  Diabetes screening  Glaucoma screening  Nutrition counseling   Screening recommendations, referrals:  Vaccinations: Tdap vaccine not indicated Influenza vaccine requested Pneumococcal vaccine we are out in the office, will get next visit Shingles vaccine too expensive Hep B vaccine not indicated  Nutrition assessed and recommended  Colonoscopy next year Mammogram requested Pap smear not indicated Pelvic exam not indicated Recommended yearly ophthalmology/optometry visit for glaucoma screening and checkup Recommended yearly dental visit for hygiene and checkup Advanced directives - given information  Conditions/risks identified: BMI: Discussed weight loss, diet, and increase physical activity.  Increase physical activity: AHA recommends 150 minutes of physical activity a week.  Medications reviewed DEXA- next year, stop fosamax, has been greater than 5 years Diabetes at goal, ACE/ARB therapy Yes. Urinary Incontinence is not an issue: discussed non pharmacology and  pharmacology options.  Fall risk: moderate- discussed PT, home fall assessment, medications.   Medicare Attestation I have personally reviewed: The patient's medical and social history Their use of alcohol, tobacco or illicit drugs Their current medications and supplements The patient's functional ability including ADLs,fall risks, home safety risks, cognitive, and hearing and visual impairment Diet and physical activities Evidence for depression or mood disorders  The patient's weight, height, BMI, and visual acuity have been recorded in the chart.  I have made referrals, counseling, and provided education to the patient based on review of the above and I have provided the patient with a written personalized care plan for preventive services.     Vicie Mutters, PA-C   07/07/2013

## 2013-07-07 NOTE — Patient Instructions (Addendum)
STOP FOSAMAX   Bad carbs also include fruit juice, alcohol, and sweet tea. These are empty calories that do not signal to your brain that you are full.   Please remember the good carbs are still carbs which convert into sugar. So please measure them out no more than 1/2-1 cup of rice, oatmeal, pasta, and beans.  Veggies are however free foods! Pile them on.   I like lean protein at every meal such as chicken, [place], pork chops, cottage cheese, etc. Just do not fry these meats and please center your meal around vegetable, the meats should be a side dish.   No all fruit is created equal. Please see the list below, the fruit at the bottom is higher in sugars than the fruit at the top    Preventative Care for Adults - Female      MAINTAIN REGULAR HEALTH EXAMS:  A routine yearly physical is a good way to check in with your primary care provider about your health and preventive screening. It is also an opportunity to share updates about your health and any concerns you have, and receive a thorough all-over exam.   Most health insurance companies pay for at least some preventative services.  Check with your health plan for specific coverages.  WHAT PREVENTATIVE SERVICES DO WOMEN NEED?  Adult women should have their weight and blood pressure checked regularly.   Women age 24 and older should have their cholesterol levels checked regularly.  Women should be screened for cervical cancer with a Pap smear and pelvic exam beginning at either age 81, or 3 years after they become sexually activity.    Breast cancer screening generally begins at age 36 with a mammogram and breast exam by your primary care provider.    Beginning at age 52 and continuing to age 74, women should be screened for colorectal cancer.  Certain people may need continued testing until age 63.  Updating vaccinations is part of preventative care.  Vaccinations help protect against diseases such as the flu.  Osteoporosis is a  disease in which the bones lose minerals and strength as we age. Women ages 85 and over should discuss this with their caregivers, as should women after menopause who have other risk factors.  Lab tests are generally done as part of preventative care to screen for anemia and blood disorders, to screen for problems with the kidneys and liver, to screen for bladder problems, to check blood sugar, and to check your cholesterol level.  Preventative services generally include counseling about diet, exercise, avoiding tobacco, drugs, excessive alcohol consumption, and sexually transmitted infections.    GENERAL RECOMMENDATIONS FOR GOOD HEALTH:  Healthy diet:  Eat a variety of foods, including fruit, vegetables, animal or vegetable protein, such as meat, fish, chicken, and eggs, or beans, lentils, tofu, and grains, such as rice.  Drink plenty of water daily.  Decrease saturated fat in the diet, avoid lots of red meat, processed foods, sweets, fast foods, and fried foods.  Exercise:  Aerobic exercise helps maintain good heart health. At least 30-40 minutes of moderate-intensity exercise is recommended. For example, a brisk walk that increases your heart rate and breathing. This should be done on most days of the week.   Find a type of exercise or a variety of exercises that you enjoy so that it becomes a part of your daily life.  Examples are running, walking, swimming, water aerobics, and biking.  For motivation and support, explore group exercise such as  aerobic class, spin class, Zumba, Yoga,or  martial arts, etc.    Set exercise goals for yourself, such as a certain weight goal, walk or run in a race such as a 5k walk/run.  Speak to your primary care provider about exercise goals.  Disease prevention:  If you smoke or chew tobacco, find out from your caregiver how to quit. It can literally save your life, no matter how long you have been a tobacco user. If you do not use tobacco, never begin.    Maintain a healthy diet and normal weight. Increased weight leads to problems with blood pressure and diabetes.   The Body Mass Index or BMI is a way of measuring how much of your body is fat. Having a BMI above 27 increases the risk of heart disease, diabetes, hypertension, stroke and other problems related to obesity. Your caregiver can help determine your BMI and based on it develop an exercise and dietary program to help you achieve or maintain this important measurement at a healthful level.  High blood pressure causes heart and blood vessel problems.  Persistent high blood pressure should be treated with medicine if weight loss and exercise do not work.   Fat and cholesterol leaves deposits in your arteries that can block them. This causes heart disease and vessel disease elsewhere in your body.  If your cholesterol is found to be high, or if you have heart disease or certain other medical conditions, then you may need to have your cholesterol monitored frequently and be treated with medication.   Ask if you should have a cardiac stress test if your history suggests this. A stress test is a test done on a treadmill that looks for heart disease. This test can find disease prior to there being a problem.  Menopause can be associated with physical symptoms and risks. Hormone replacement therapy is available to decrease these. You should talk to your caregiver about whether starting or continuing to take hormones is right for you.   Osteoporosis is a disease in which the bones lose minerals and strength as we age. This can result in serious bone fractures. Risk of osteoporosis can be identified using a bone density scan. Women ages 3 and over should discuss this with their caregivers, as should women after menopause who have other risk factors. Ask your caregiver whether you should be taking a calcium supplement and Vitamin D, to reduce the rate of osteoporosis.   Avoid drinking alcohol in  excess (more than two drinks per day).  Avoid use of street drugs. Do not share needles with anyone. Ask for professional help if you need assistance or instructions on stopping the use of alcohol, cigarettes, and/or drugs.  Brush your teeth twice a day with fluoride toothpaste, and floss once a day. Good oral hygiene prevents tooth decay and gum disease. The problems can be painful, unattractive, and can cause other health problems. Visit your dentist for a routine oral and dental check up and preventive care every 6-12 months.   Look at your skin regularly.  Use a mirror to look at your back. Notify your caregivers of changes in moles, especially if there are changes in shapes, colors, a size larger than a pencil eraser, an irregular border, or development of new moles.  Safety:  Use seatbelts 100% of the time, whether driving or as a passenger.  Use safety devices such as hearing protection if you work in environments with loud noise or significant background noise.  Use  safety glasses when doing any work that could send debris in to the eyes.  Use a helmet if you ride a bike or motorcycle.  Use appropriate safety gear for contact sports.  Talk to your caregiver about gun safety.  Use sunscreen with a SPF (or skin protection factor) of 15 or greater.  Lighter skinned people are at a greater risk of skin cancer. Don't forget to also wear sunglasses in order to protect your eyes from too much damaging sunlight. Damaging sunlight can accelerate cataract formation.   Practice safe sex. Use condoms. Condoms are used for birth control and to help reduce the spread of sexually transmitted infections (or STIs).  Some of the STIs are gonorrhea (the clap), chlamydia, syphilis, trichomonas, herpes, HPV (human papilloma virus) and HIV (human immunodeficiency virus) which causes AIDS. The herpes, HIV and HPV are viral illnesses that have no cure. These can result in disability, cancer and death.   Keep carbon  monoxide and smoke detectors in your home functioning at all times. Change the batteries every 6 months or use a model that plugs into the wall.   Vaccinations:  Stay up to date with your tetanus shots and other required immunizations. You should have a booster for tetanus every 10 years. Be sure to get your flu shot every year, since 5%-20% of the [place] population comes down with the flu. The flu vaccine changes each year, so being vaccinated once is not enough. Get your shot in the fall, before the flu season peaks.   Other vaccines to consider:  Human Papilloma Virus or HPV causes cancer of the cervix, and other infections that can be transmitted from person to person. There is a vaccine for HPV, and females should get immunized between the ages of 67 and 16. It requires a series of 3 shots.   Pneumococcal vaccine to protect against certain types of pneumonia.  This is normally recommended for adults age 52 or older.  However, adults younger than 72 years old with certain underlying conditions such as diabetes, heart or lung disease should also receive the vaccine.  Shingles vaccine to protect against Varicella Zoster if you are older than age 60, or younger than 72 years old with certain underlying illness.  Hepatitis A vaccine to protect against a form of infection of the liver by a virus acquired from food.  Hepatitis B vaccine to protect against a form of infection of the liver by a virus acquired from blood or body fluids, particularly if you work in health care.  If you plan to travel internationally, check with your local health department for specific vaccination recommendations.  Cancer Screening:  Breast cancer screening is essential to preventive care for women. All women age 80 and older should perform a breast self-exam every month. At age 43 and older, women should have their caregiver complete a breast exam each year. Women at ages 55 and older should have a mammogram (x-ray  film) of the breasts. Your caregiver can discuss how often you need mammograms.    Cervical cancer screening includes taking a Pap smear (sample of cells examined under a microscope) from the cervix (end of the uterus). It also includes testing for HPV (Human Papilloma Virus, which can cause cervical cancer). Screening and a pelvic exam should begin at age 66, or 3 years after a woman becomes sexually active. Screening should occur every year, with a Pap smear but no HPV testing, up to age 40. After age 55, you should  have a Pap smear every 3 years with HPV testing, if no HPV was found previously.   Most routine colon cancer screening begins at the age of 25. On a yearly basis, doctors may provide special easy to use take-home tests to check for hidden blood in the stool. Sigmoidoscopy or colonoscopy can detect the earliest forms of colon cancer and is life saving. These tests use a small camera at the end of a tube to directly examine the colon. Speak to your caregiver about this at age 5, when routine screening begins (and is repeated every 5 years unless early forms of pre-cancerous polyps or small growths are found).

## 2013-07-08 LAB — URINALYSIS, ROUTINE W REFLEX MICROSCOPIC
Bilirubin Urine: NEGATIVE
Glucose, UA: NEGATIVE mg/dL
HGB URINE DIPSTICK: NEGATIVE
Leukocytes, UA: NEGATIVE
NITRITE: NEGATIVE
Protein, ur: NEGATIVE mg/dL
Specific Gravity, Urine: 1.025 (ref 1.005–1.030)
Urobilinogen, UA: 1 mg/dL (ref 0.0–1.0)
pH: 5 (ref 5.0–8.0)

## 2013-07-08 LAB — HEMOGLOBIN A1C
Hgb A1c MFr Bld: 7.4 % — ABNORMAL HIGH (ref <5.7)
Mean Plasma Glucose: 166 mg/dL — ABNORMAL HIGH (ref <117)

## 2013-07-08 LAB — VITAMIN D 25 HYDROXY (VIT D DEFICIENCY, FRACTURES): VIT D 25 HYDROXY: 43 ng/mL (ref 30–89)

## 2013-07-08 LAB — MICROALBUMIN / CREATININE URINE RATIO
Creatinine, Urine: 160 mg/dL
MICROALB UR: 0.5 mg/dL (ref 0.00–1.89)
Microalb Creat Ratio: 3.1 mg/g (ref 0.0–30.0)

## 2013-07-28 ENCOUNTER — Other Ambulatory Visit (INDEPENDENT_AMBULATORY_CARE_PROVIDER_SITE_OTHER): Payer: Medicare HMO | Admitting: *Deleted

## 2013-07-28 DIAGNOSIS — Z1211 Encounter for screening for malignant neoplasm of colon: Secondary | ICD-10-CM

## 2013-07-28 LAB — POC HEMOCCULT BLD/STL (HOME/3-CARD/SCREEN)
FECAL OCCULT BLD: NEGATIVE
FECAL OCCULT BLD: NEGATIVE
Fecal Occult Blood, POC: NEGATIVE

## 2013-08-26 ENCOUNTER — Other Ambulatory Visit: Payer: Self-pay | Admitting: Physician Assistant

## 2013-11-25 ENCOUNTER — Other Ambulatory Visit: Payer: Self-pay | Admitting: Physician Assistant

## 2014-01-12 ENCOUNTER — Other Ambulatory Visit: Payer: Self-pay | Admitting: Physician Assistant

## 2014-01-12 ENCOUNTER — Ambulatory Visit (INDEPENDENT_AMBULATORY_CARE_PROVIDER_SITE_OTHER): Payer: Medicare HMO | Admitting: Physician Assistant

## 2014-01-12 ENCOUNTER — Encounter: Payer: Self-pay | Admitting: Physician Assistant

## 2014-01-12 VITALS — BP 110/70 | HR 64 | Temp 98.6°F | Resp 16 | Ht 62.0 in | Wt 155.0 lb

## 2014-01-12 DIAGNOSIS — Z23 Encounter for immunization: Secondary | ICD-10-CM

## 2014-01-12 DIAGNOSIS — E1122 Type 2 diabetes mellitus with diabetic chronic kidney disease: Secondary | ICD-10-CM | POA: Insufficient documentation

## 2014-01-12 DIAGNOSIS — E559 Vitamin D deficiency, unspecified: Secondary | ICD-10-CM

## 2014-01-12 DIAGNOSIS — E079 Disorder of thyroid, unspecified: Secondary | ICD-10-CM

## 2014-01-12 DIAGNOSIS — Z79899 Other long term (current) drug therapy: Secondary | ICD-10-CM

## 2014-01-12 DIAGNOSIS — N182 Chronic kidney disease, stage 2 (mild): Secondary | ICD-10-CM

## 2014-01-12 DIAGNOSIS — E119 Type 2 diabetes mellitus without complications: Secondary | ICD-10-CM

## 2014-01-12 DIAGNOSIS — M8580 Other specified disorders of bone density and structure, unspecified site: Secondary | ICD-10-CM

## 2014-01-12 DIAGNOSIS — E785 Hyperlipidemia, unspecified: Secondary | ICD-10-CM

## 2014-01-12 LAB — HEPATIC FUNCTION PANEL
ALK PHOS: 81 U/L (ref 39–117)
ALT: 36 U/L — ABNORMAL HIGH (ref 0–35)
AST: 44 U/L — ABNORMAL HIGH (ref 0–37)
Albumin: 3.6 g/dL (ref 3.5–5.2)
BILIRUBIN DIRECT: 0.1 mg/dL (ref 0.0–0.3)
BILIRUBIN INDIRECT: 0.7 mg/dL (ref 0.2–1.2)
BILIRUBIN TOTAL: 0.8 mg/dL (ref 0.2–1.2)
Total Protein: 6.5 g/dL (ref 6.0–8.3)

## 2014-01-12 LAB — BASIC METABOLIC PANEL WITH GFR
BUN: 15 mg/dL (ref 6–23)
CHLORIDE: 105 meq/L (ref 96–112)
CO2: 26 meq/L (ref 19–32)
Calcium: 9.2 mg/dL (ref 8.4–10.5)
Creat: 0.78 mg/dL (ref 0.50–1.10)
GFR, EST NON AFRICAN AMERICAN: 76 mL/min
GFR, Est African American: 88 mL/min
Glucose, Bld: 196 mg/dL — ABNORMAL HIGH (ref 70–99)
POTASSIUM: 4.1 meq/L (ref 3.5–5.3)
Sodium: 137 mEq/L (ref 135–145)

## 2014-01-12 LAB — LIPID PANEL
Cholesterol: 202 mg/dL — ABNORMAL HIGH (ref 0–200)
HDL: 66 mg/dL (ref >39)
LDL CALC: 110 mg/dL — AB (ref 0–99)
Total CHOL/HDL Ratio: 3.1 Ratio
Triglycerides: 130 mg/dL (ref <150)
VLDL: 26 mg/dL (ref 0–40)

## 2014-01-12 LAB — TSH: TSH: 22.065 u[IU]/mL — ABNORMAL HIGH (ref 0.350–4.500)

## 2014-01-12 LAB — MAGNESIUM: MAGNESIUM: 1.7 mg/dL (ref 1.5–2.5)

## 2014-01-12 MED ORDER — SITAGLIPTIN PHOS-METFORMIN HCL 50-500 MG PO TABS: 1.0000 | ORAL_TABLET | Freq: Two times a day (BID) | ORAL | Status: DC

## 2014-01-12 MED ORDER — GLUCOSE BLOOD VI STRP
ORAL_STRIP | Status: DC
Start: 2014-01-12 — End: 2014-06-01

## 2014-01-12 MED ORDER — SIMVASTATIN 20 MG PO TABS: ORAL_TABLET | ORAL | Status: DC

## 2014-01-12 NOTE — Progress Notes (Signed)
Assessment and Plan:  Hypertension: Continue medication, monitor blood pressure at home. Continue DASH diet.  Reminder to go to the ER if any CP, SOB, nausea, dizziness, severe HA, changes vision/speech, left arm numbness and tingling and jaw pain. Cholesterol: Continue diet and exercise. Will add on Simvastatin, goal is less than 70. Follow up 1 month for Labs, recheck CPK, Chol, LFTs.   Diabetes-Continue diet and exercise. Check A1C Vitamin D Def- check level and continue medications.  She will add bASA  Continue diet and meds as discussed. Further disposition pending results of labs. Discussed med's effects and SE's.    HPI 73 y.o. female  presents for 3 month follow up with hypertension, hyperlipidemia, diabetes and vitamin D.  Her blood pressure has been controlled at home, today their BP is BP: 110/70 mmHg She does not workout. She denies chest pain, shortness of breath, dizziness.   She is not on cholesterol medication and denies myalgias. Her cholesterol is not at goal. The cholesterol last visit was:   Lab Results  Component Value Date   CHOL 189 07/07/2013   HDL 57 07/07/2013   LDLCALC 114* 07/07/2013   TRIG 89 07/07/2013   CHOLHDL 3.3 07/07/2013    She has been working on diet and exercise for Diabetes, she only takes one Janumet once daily and she is on the glipizide 2 in the AM and 1 in the after noon, and denies hypoglycemia , polydipsia and polyuria.She is not on an ACE due to low BP. Last A1C in the office was:  Lab Results  Component Value Date   HGBA1C 7.4* 07/07/2013    Patient is on Vitamin D supplement. Lab Results  Component Value Date   VD25OH 43 07/07/2013      Current Medications:  Current Outpatient Prescriptions on File Prior to Visit  Medication Sig Dispense Refill  . alendronate (FOSAMAX) 35 MG tablet Take 1 tablet (35 mg total) by mouth every 7 (seven) days. Take with full glass of water.  4 tablet  6  . calcium carbonate (OS-CAL) 600 MG TABS Take 600  mg by mouth 2 (two) times daily with a meal.        . Cholecalciferol (VITAMIN D) 2000 UNITS tablet Take 4,000 Units by mouth 2 (two) times daily.      . Flaxseed, Linseed, 1300 MG CAPS Take 1 capsule by mouth 2 (two) times daily.      Marland Kitchen glipiZIDE (GLUCOTROL) 5 MG tablet TAKE ONE TABLET BY MOUTH THREE TIMES DAILY BEFORE MEAL(S)  270 tablet  0  . levothyroxine (SYNTHROID, LEVOTHROID) 150 MCG tablet TAKE ONE TABLET BY MOUTH EVERY DAY  90 tablet  0  . Omega-3 Fatty Acids (FISH OIL) 1200 MG CAPS Take 1 capsule by mouth 2 (two) times daily.      . sitaGLIPtin-metformin (JANUMET) 50-500 MG per tablet Take 1 tablet by mouth 2 (two) times daily with a meal.  60 tablet  5   No current facility-administered medications on file prior to visit.   Medical History:  Past Medical History  Diagnosis Date  . Breast cancer 05/2006    Right breast cancer  . Thyroid disease   . Diabetes mellitus without complication   . Cirrhosis   . Osteopenia   . Fibroids    Allergies:  Allergies  Allergen Reactions  . Metformin And Related   . Welchol [Colesevelam Hcl]      Review of Systems: [X]  = complains of  [ ]  = denies  General: Fatigue [ ]  Fever [ ]  Chills [ ]  Weakness [ ]   Insomnia [ ]  Eyes: Redness [ ]  Blurred vision [ ]  Diplopia [ ]   ENT: Congestion [ ]  Sinus Pain [ ]  Post Nasal Drip [ ]  Sore Throat [ ]  Earache [ ]   Cardiac: Chest pain/pressure [ ]  SOB [ ]  Orthopnea [ ]   Palpitations [ ]   Paroxysmal nocturnal dyspnea[ ]  Claudication [ ]  Edema [ ]   Pulmonary: Cough [ ]  Wheezing[ ]   SOB [ ]   Snoring [ ]   GI: Nausea [ ]  Vomiting[ ]  Dysphagia[ ]  Heartburn[ ]  Abdominal pain [ ]  Constipation [ ] ; Diarrhea [ ] ; BRBPR [ ]  Melena[ ]  GU: Hematuria[ ]  Dysuria [ ]  Nocturia[ ]  Urgency [ ]   Hesitancy [ ]  Discharge [ ]  Neuro: Headaches[ ]  Vertigo[ ]  Paresthesias[ ]  Spasm [ ]  Speech changes [ ]  Incoordination [ ]   Ortho: Arthritis [ ]  Joint pain [ ]  Muscle pain [ ]  Joint swelling [ ]  Back Pain [ ]  Skin:  Rash [ ]    Pruritis [ ]  Change in skin lesion [ ]   Psych: Depression[ ]  Anxiety[ ]  Confusion [ ]  Memory loss [ ]   Heme/Lypmh: Bleeding [ ]  Bruising [ ]  Enlarged lymph nodes [ ]   Endocrine: Visual blurring [ ]  Paresthesia [ ]  Polyuria [ ]  Polydypsea [ ]    Heat/cold intolerance [ ]  Hypoglycemia [ ]   Family history- Review and unchanged Social history- Review and unchanged Physical Exam: BP 110/70  Pulse 64  Temp(Src) 98.6 F (37 C)  Resp 16  Ht 5' 2"  (1.575 m)  Wt 155 lb (70.308 kg)  BMI 28.34 kg/m2 Wt Readings from Last 3 Encounters:  01/12/14 155 lb (70.308 kg)  07/07/13 151 lb (68.493 kg)  06/21/13 151 lb (68.493 kg)   General Appearance: Well nourished, in no apparent distress. Eyes: PERRLA, EOMs, conjunctiva no swelling or erythema Sinuses: No Frontal/maxillary tenderness ENT/Mouth: Ext aud canals clear, TMs without erythema, bulging. No erythema, swelling, or exudate on post pharynx.  Tonsils not swollen or erythematous. Hearing normal.  Neck: Supple, thyroid normal.  Respiratory: Respiratory effort normal, BS equal bilaterally without rales, rhonchi, wheezing or stridor.  Cardio: RRR with no MRGs. Brisk peripheral pulses without edema.  Abdomen: Soft, + BS.  Non tender, no guarding, rebound, hernias, masses. Lymphatics: Non tender without lymphadenopathy.  Musculoskeletal: Full ROM, 5/5 strength, normal gait.  Skin: Warm, dry without rashes, lesions, ecchymosis.  Neuro: Cranial nerves intact. No cerebellar symptoms. Sensation intact.  Psych: Awake and oriented X 3, normal affect, Insight and Judgment appropriate.    Vicie Mutters, PA-C 3:17 PM [facility]

## 2014-01-12 NOTE — Patient Instructions (Addendum)
Add low dose, 20m Aspirin daily.   Benefiber is good for constipation/diarrhea/irritable bowel syndrome, it helps with weight loss and can help lower your bad cholesterol. Please do 1-2 TBSP in the morning in water, coffee, or tea. It can take up to a month before you can see a difference with your bowel movements. It is cheapest from [employer], [employer], [employer].     Bad carbs also include fruit juice, alcohol, and sweet tea. These are empty calories that do not signal to your brain that you are full.   Please remember the good carbs are still carbs which convert into sugar. So please measure them out no more than 1/2-1 cup of rice, oatmeal, pasta, and beans.  Veggies are however free foods! Pile them on.   I like lean protein at every meal such as chicken, [place] pork chops, cottage cheese, etc. Just do not fry these meats and please center your meal around vegetable, the meats should be a side dish.   No all fruit is created equal. Please see the list below, the fruit at the bottom is higher in sugars than the fruit at the top   Glipizide forces your blood sugar down no matter what it is starting at. This can cause diabetics to have to eat more to keep their blood sugar elevated which goes against what our goals are for you. Please never take this medication if you are sick or can not eat. A low blood sugar is much more dangerous than a high blood sugar.

## 2014-01-13 LAB — CBC WITH DIFFERENTIAL/PLATELET
BASOS ABS: 0 10*3/uL (ref 0.0–0.1)
Basophils Relative: 1 % (ref 0–1)
Eosinophils Absolute: 0.1 10*3/uL (ref 0.0–0.7)
Eosinophils Relative: 3 % (ref 0–5)
HCT: 38.6 % (ref 36.0–46.0)
Hemoglobin: 12.8 g/dL (ref 12.0–15.0)
LYMPHS ABS: 1.1 10*3/uL (ref 0.7–4.0)
LYMPHS PCT: 30 % (ref 12–46)
MCH: 35.5 pg — ABNORMAL HIGH (ref 26.0–34.0)
MCHC: 33.2 g/dL (ref 30.0–36.0)
MCV: 106.9 fL — ABNORMAL HIGH (ref 78.0–100.0)
Monocytes Absolute: 0.4 10*3/uL (ref 0.1–1.0)
Monocytes Relative: 11 % (ref 3–12)
NEUTROS ABS: 2 10*3/uL (ref 1.7–7.7)
Neutrophils Relative %: 55 % (ref 43–77)
PLATELETS: 94 10*3/uL — AB (ref 150–400)
RBC: 3.61 MIL/uL — AB (ref 3.87–5.11)
RDW: 13.7 % (ref 11.5–15.5)
WBC: 3.6 10*3/uL — AB (ref 4.0–10.5)

## 2014-01-13 LAB — VITAMIN D 25 HYDROXY (VIT D DEFICIENCY, FRACTURES): Vit D, 25-Hydroxy: 35 ng/mL (ref 30–89)

## 2014-01-13 LAB — HEMOGLOBIN A1C
Hgb A1c MFr Bld: 8.1 % — ABNORMAL HIGH (ref <5.7)
Mean Plasma Glucose: 186 mg/dL — ABNORMAL HIGH (ref <117)

## 2014-01-13 LAB — VITAMIN B12: Vitamin B-12: 569 pg/mL (ref 211–911)

## 2014-01-13 LAB — IRON AND TIBC
%SAT: 30 % (ref 20–55)
Iron: 99 ug/dL (ref 42–145)
TIBC: 329 ug/dL (ref 250–470)
UIBC: 230 ug/dL (ref 125–400)

## 2014-01-13 LAB — FERRITIN: Ferritin: 65 ng/mL (ref 10–291)

## 2014-01-17 ENCOUNTER — Other Ambulatory Visit: Payer: Self-pay

## 2014-01-17 MED ORDER — LEVOTHYROXINE SODIUM 150 MCG PO TABS: 150.0000 ug | ORAL_TABLET | Freq: Every day | ORAL | Status: DC

## 2014-02-16 ENCOUNTER — Other Ambulatory Visit: Payer: Medicare HMO

## 2014-02-16 ENCOUNTER — Other Ambulatory Visit: Payer: Self-pay

## 2014-02-16 DIAGNOSIS — E079 Disorder of thyroid, unspecified: Secondary | ICD-10-CM

## 2014-02-16 DIAGNOSIS — E785 Hyperlipidemia, unspecified: Secondary | ICD-10-CM

## 2014-02-16 DIAGNOSIS — D696 Thrombocytopenia, unspecified: Secondary | ICD-10-CM

## 2014-02-16 DIAGNOSIS — Z79899 Other long term (current) drug therapy: Secondary | ICD-10-CM

## 2014-02-16 LAB — LIPID PANEL
Cholesterol: 139 mg/dL (ref 0–200)
HDL: 62 mg/dL (ref >39)
LDL Cholesterol: 58 mg/dL (ref 0–99)
TRIGLYCERIDES: 97 mg/dL (ref <150)
Total CHOL/HDL Ratio: 2.2 Ratio
VLDL: 19 mg/dL (ref 0–40)

## 2014-02-16 LAB — HEPATIC FUNCTION PANEL
ALK PHOS: 77 U/L (ref 39–117)
ALT: 36 U/L — ABNORMAL HIGH (ref 0–35)
AST: 44 U/L — ABNORMAL HIGH (ref 0–37)
Albumin: 3.8 g/dL (ref 3.5–5.2)
BILIRUBIN INDIRECT: 0.8 mg/dL (ref 0.2–1.2)
BILIRUBIN TOTAL: 1.1 mg/dL (ref 0.2–1.2)
Bilirubin, Direct: 0.3 mg/dL (ref 0.0–0.3)
Total Protein: 6.7 g/dL (ref 6.0–8.3)

## 2014-02-16 LAB — CK: Total CK: 224 U/L — ABNORMAL HIGH (ref 7–177)

## 2014-02-16 LAB — TSH: TSH: 0.207 u[IU]/mL — ABNORMAL LOW (ref 0.350–4.500)

## 2014-02-16 MED ORDER — GLIPIZIDE 5 MG PO TABS: ORAL_TABLET | ORAL | Status: DC

## 2014-02-17 LAB — CBC WITH DIFFERENTIAL/PLATELET
BASOS ABS: 0 10*3/uL (ref 0.0–0.1)
Basophils Relative: 1 % (ref 0–1)
EOS ABS: 0.1 10*3/uL (ref 0.0–0.7)
EOS PCT: 3 % (ref 0–5)
HCT: 36.8 % (ref 36.0–46.0)
Hemoglobin: 12.8 g/dL (ref 12.0–15.0)
LYMPHS PCT: 29 % (ref 12–46)
Lymphs Abs: 1 10*3/uL (ref 0.7–4.0)
MCH: 36.2 pg — ABNORMAL HIGH (ref 26.0–34.0)
MCHC: 34.8 g/dL (ref 30.0–36.0)
MCV: 104 fL — AB (ref 78.0–100.0)
MPV: 11.1 fL (ref 9.4–12.4)
Monocytes Absolute: 0.4 10*3/uL (ref 0.1–1.0)
Monocytes Relative: 11 % (ref 3–12)
Neutro Abs: 1.9 10*3/uL (ref 1.7–7.7)
Neutrophils Relative %: 56 % (ref 43–77)
Platelets: 97 10*3/uL — ABNORMAL LOW (ref 150–400)
RBC: 3.54 MIL/uL — ABNORMAL LOW (ref 3.87–5.11)
RDW: 13.7 % (ref 11.5–15.5)
WBC: 3.4 10*3/uL — ABNORMAL LOW (ref 4.0–10.5)

## 2014-02-24 ENCOUNTER — Telehealth: Payer: Self-pay

## 2014-02-24 NOTE — Telephone Encounter (Signed)
Return call to patient at [phone] mailed patient copy of labs , she states that she wasn't sure what we talked about yesterday and that she was kidding when she told me that her liver enzymes may be elevated due to increase in drinking as she states today that she does not drink at all

## 2014-03-23 ENCOUNTER — Ambulatory Visit: Payer: Self-pay

## 2014-03-23 ENCOUNTER — Ambulatory Visit (INDEPENDENT_AMBULATORY_CARE_PROVIDER_SITE_OTHER): Payer: Medicare HMO | Admitting: *Deleted

## 2014-03-23 DIAGNOSIS — R748 Abnormal levels of other serum enzymes: Secondary | ICD-10-CM

## 2014-03-23 LAB — HEPATIC FUNCTION PANEL
ALT: 36 U/L — AB (ref 0–35)
AST: 50 U/L — ABNORMAL HIGH (ref 0–37)
Albumin: 3.4 g/dL — ABNORMAL LOW (ref 3.5–5.2)
Alkaline Phosphatase: 75 U/L (ref 39–117)
BILIRUBIN DIRECT: 0.3 mg/dL (ref 0.0–0.3)
Indirect Bilirubin: 0.7 mg/dL (ref 0.2–1.2)
Total Bilirubin: 1 mg/dL (ref 0.2–1.2)
Total Protein: 6 g/dL (ref 6.0–8.3)

## 2014-03-23 NOTE — Progress Notes (Signed)
Patient ID: Beverly Davis, female   DOB: [birth year], 73 y.o.   MRN: [id number] Patient presents for recheck HFP and Hep Panel per Vicie Mutters, PA-C orders.

## 2014-03-24 LAB — HEPATITIS B SURFACE ANTIBODY,QUALITATIVE: Hep B S Ab: NEGATIVE

## 2014-03-24 LAB — HEPATITIS A ANTIBODY, TOTAL: Hep A Total Ab: REACTIVE — AB

## 2014-03-24 LAB — HEPATITIS B CORE ANTIBODY, TOTAL: HEP B C TOTAL AB: NONREACTIVE

## 2014-03-24 LAB — HEPATITIS C ANTIBODY: HCV Ab: NEGATIVE

## 2014-03-28 LAB — HEPATITIS B E ANTIBODY: HEPATITIS BE ANTIBODY: NONREACTIVE

## 2014-05-09 ENCOUNTER — Encounter: Payer: Self-pay | Admitting: Gastroenterology

## 2014-05-18 ENCOUNTER — Encounter: Payer: Self-pay | Admitting: Internal Medicine

## 2014-05-18 ENCOUNTER — Ambulatory Visit (INDEPENDENT_AMBULATORY_CARE_PROVIDER_SITE_OTHER): Payer: Medicare HMO | Admitting: Internal Medicine

## 2014-05-18 VITALS — BP 122/60 | HR 64 | Temp 98.6°F | Resp 16 | Ht 62.0 in | Wt 153.0 lb

## 2014-05-18 DIAGNOSIS — I10 Essential (primary) hypertension: Secondary | ICD-10-CM | POA: Insufficient documentation

## 2014-05-18 DIAGNOSIS — R030 Elevated blood-pressure reading, without diagnosis of hypertension: Secondary | ICD-10-CM

## 2014-05-18 DIAGNOSIS — IMO0001 Reserved for inherently not codable concepts without codable children: Secondary | ICD-10-CM

## 2014-05-18 DIAGNOSIS — E559 Vitamin D deficiency, unspecified: Secondary | ICD-10-CM

## 2014-05-18 DIAGNOSIS — Z79899 Other long term (current) drug therapy: Secondary | ICD-10-CM

## 2014-05-18 DIAGNOSIS — E1129 Type 2 diabetes mellitus with other diabetic kidney complication: Secondary | ICD-10-CM

## 2014-05-18 DIAGNOSIS — Z0001 Encounter for general adult medical examination with abnormal findings: Secondary | ICD-10-CM | POA: Insufficient documentation

## 2014-05-18 DIAGNOSIS — E039 Hypothyroidism, unspecified: Secondary | ICD-10-CM | POA: Insufficient documentation

## 2014-05-18 NOTE — Progress Notes (Signed)
Patient ID: Beverly Davis, female   DOB: 1940/06/22, 74 y.o.   MRN: [id number]   This very nice 74 y.o.female presents for 3 month follow up with Hypertension, Hyperlipidemia, T2_NIDDM and Vitamin D Deficiency.    Patient has hx/o elevated BP's and has been monitored expectantly & BP has been controlled at home. Today's BP: 122/60 mmHg. Patient has had no complaints of any cardiac type chest pain, palpitations, dyspnea/orthopnea/PND, dizziness, claudication, or dependent edema.   Hyperlipidemia is controlled with diet & meds. Patient denies myalgias or other med SE's. Last Lipids were at goal -  Total Chol 139; HDL  62; LDL 58; Trig 97 on 02/16/2014, but patient is currently off Zocor due to elevated LFT's.   Also, the patient has history of T2_NIDDM since 2007 and despite poor dietary compliance and consequent poor control she has had no symptoms of reactive hypoglycemia, diabetic polys, paresthesias or visual blurring.  Last A1c was  8.1% on  01/12/2014.   Further, the patient also has history of Vitamin D Deficiency and supplements vitamin D without any suspected side-effects. Last vitamin D was  35 on  01/12/2014.  Medication Sig  . alendronate (FOSAMAX) 35 MG tablet Take 1 tablet (35 mg total) by mouth every 7 (seven) days. Take with full glass of water.  . calcium carbonate (OS-CAL) 600 MG TABS Take 600 mg by mouth 2 (two) times daily with a meal.    . Cholecalciferol (VITAMIN D) 2000 UNITS tablet Take 4,000 Units by mouth 2 (two) times daily.  . Flaxseed, Linseed, 1300 MG CAPS Take 1 capsule by mouth 2 (two) times daily.  Marland Kitchen glipiZIDE (GLUCOTROL) 5 MG tablet TAKE ONE TABLET BY MOUTH THREE TIMES DAILY BEFORE MEAL(S)  . glucose blood (FREESTYLE LITE) test strip She checks her sugars once daily, but up to 3 times a day for fluctuating blood sugars.  Marland Kitchen levothyroxine 150 MCG tablet Take 1 tablet (150 mcg total) by mouth daily before breakfast.  . Omega-3 Fatty Acids (FISH OIL) 1200 MG CAPS Take  1 capsule by mouth 2 (two) times daily.  Marland Kitchen JANUMET 50-500 MG per tablet Take 1 tablet by mouth 2 (two) times daily with a meal.  . Simvastatin  20 MG tablet Once nightly for cholesterol  - OFF due to sl LFT elevatio   Allergies  Allergen Reactions  . Metformin And Related   . Welchol [Colesevelam Hcl]    PMHx:   Past Medical History  Diagnosis Date  . Breast cancer 05/2006    Right breast cancer  . Thyroid disease   . Diabetes mellitus without complication   . Cirrhosis   . Osteopenia   . Fibroids    Immunization History  Administered Date(s) Administered  . Influenza Split 03/06/2011, 01/06/2013  . Influenza, High Dose Seasonal PF 01/12/2014  . Pneumococcal Conjugate-13 01/12/2014  . Tdap 06/10/2012   Past Surgical History  Procedure Laterality Date  . Breast lumpectomy with axillary lymph node biopsy Right 06/25/2006  . Cholecystectomy    . Abdominal hysterectomy      TAH BSO  . Tubal ligation  1978   FHx:    Reviewed / unchanged  SHx:    Reviewed / unchanged  Systems Review:  Constitutional: Denies fever, chills, wt changes, headaches, insomnia, fatigue, night sweats, change in appetite. Eyes: Denies redness, blurred vision, diplopia, discharge, itchy, watery eyes.  ENT: Denies discharge, congestion, post nasal drip, epistaxis, sore throat, earache, hearing loss, dental pain, tinnitus, vertigo, sinus pain, snoring.  CV: Denies chest pain, palpitations, irregular heartbeat, syncope, dyspnea, diaphoresis, orthopnea, PND, claudication or edema. Respiratory: denies cough, dyspnea, DOE, pleurisy, hoarseness, laryngitis, wheezing.  Gastrointestinal: Denies dysphagia, odynophagia, heartburn, reflux, water brash, abdominal pain or cramps, nausea, vomiting, bloating, diarrhea, constipation, hematemesis, melena, hematochezia  or hemorrhoids. Genitourinary: Denies dysuria, frequency, urgency, nocturia, hesitancy, discharge, hematuria or flank pain. Musculoskeletal: Denies  arthralgias, myalgias, stiffness, jt. swelling, pain, limping or strain/sprain.  Skin: Denies pruritus, rash, hives, warts, acne, eczema or change in skin lesion(s). Neuro: No weakness, tremor, incoordination, spasms, paresthesia or pain. Psychiatric: Denies confusion, memory loss or sensory loss. Endo: Denies change in weight, skin or hair change.  Heme/Lymph: No excessive bleeding, bruising or enlarged lymph nodes.  Physical Exam  BP 122/60   Pulse 64  Temp 98.6 F   Resp 16  Ht 5' 2"    Wt 153 lb     BMI 27.98   Appears well nourished and in no distress. Eyes: PERRLA, EOMs, conjunctiva no swelling or erythema. Sinuses: No frontal/maxillary tenderness ENT/Mouth: EAC's clear, TM's nl w/o erythema, bulging. Nares clear w/o erythema, swelling, exudates. Oropharynx clear without erythema or exudates. Oral hygiene is good. Tongue normal, non obstructing. Hearing intact.  Neck: Supple. Thyroid nl. Car 2+/2+ without bruits, nodes or JVD. Chest: Respirations nl with BS clear & equal w/o rales, rhonchi, wheezing or stridor.  Cor: Heart sounds normal w/ regular rate and rhythm without sig. murmurs, gallops, clicks, or rubs. Peripheral pulses normal and equal  without edema.  Abdomen: Soft & bowel sounds normal. Non-tender w/o guarding, rebound, hernias, masses, or organomegaly.  Lymphatics: Unremarkable.  Musculoskeletal: Full ROM all peripheral extremities, joint stability, 5/5 strength, and normal gait.  Skin: Warm, dry without exposed rashes, lesions or ecchymosis apparent.  Neuro: Cranial nerves intact, reflexes equal bilaterally. Sensory-motor testing grossly intact. Tendon reflexes grossly intact.  Pysch: Alert & oriented x 3.  Insight and judgement nl & appropriate. No ideations.  Assessment and Plan:  1. Hypothyroidism, unspecified hypothyroidism type  - TSH  2. Elevated BP  - Lipid panel  3. Type 2 diabetes mellitus with other diabetic kidney complication  - Hemoglobin  A1c - Insulin, fasting  4. Vitamin D deficiency  - Vit D  25 hydroxy (rtn osteoporosis monitoring)  5. Medication management  - CBC with Differential/Platelet - BASIC METABOLIC PANEL WITH GFR - Hepatic function panel; Future - Magnesium   Recommended regular exercise, BP monitoring, weight control, and discussed med and SE's. Recommended labs to assess and monitor clinical status. Further disposition pending results of labs.

## 2014-05-18 NOTE — Patient Instructions (Signed)
   Recommend the book "The END of DIETING" by Dr Excell Seltzer   & the book "The END of DIABETES " by Dr Excell Seltzer  At [email or web address] - get book & Audio CD's      Being diabetic has a  300% increased risk for heart attack, stroke, cancer, and alzheimer- type vascular dementia. It is very important that you work harder with diet by avoiding all foods that are white. Avoid white rice (brown & wild rice is OK), white potatoes (sweetpotatoes in moderation is OK), White bread or wheat bread or anything made out of white flour like bagels, donuts, rolls, buns, biscuits, cakes, pastries, cookies, pizza crust, and pasta (made from white flour & egg whites) - vegetarian pasta or spinach or wheat pasta is OK. Multigrain breads like Arnold's or Pepperidge Farm, or multigrain sandwich thins or flatbreads.  Diet, exercise and weight loss can reverse and cure diabetes in the early stages.  Diet, exercise and weight loss is very important in the control and prevention of complications of diabetes which affects every system in your body, ie. Brain - dementia/stroke, eyes - glaucoma/blindness, heart - heart attack/heart failure, kidneys - dialysis, stomach - gastric paralysis, intestines - malabsorption, nerves - severe painful neuritis, circulation - gangrene & loss of a leg(s), and finally cancer and Alzheimers.    I recommend avoid fried & greasy foods,  sweets/candy, white rice (brown or wild rice or Quinoa is OK), white potatoes (sweet potatoes are OK) - anything made from white flour - bagels, doughnuts, rolls, buns, biscuits,white and wheat breads, pizza crust and traditional pasta made of white flour & egg white(vegetarian pasta or spinach or wheat pasta is OK).  Multi-grain bread is OK - like multi-grain flat bread or sandwich thins. Avoid alcohol in excess. Exercise is also important.    Eat all the vegetables you want - avoid meat, especially red meat and dairy - especially cheese.  Cheese is the most  concentrated form of trans-fats which is the worst thing to clog up our arteries. Veggie cheese is OK which can be found in the fresh produce section at [facility] or [employer] or [employer]

## 2014-05-19 ENCOUNTER — Other Ambulatory Visit: Payer: Self-pay | Admitting: *Deleted

## 2014-05-19 LAB — MAGNESIUM: MAGNESIUM: 1.6 mg/dL (ref 1.5–2.5)

## 2014-05-19 LAB — HEMOGLOBIN A1C
HEMOGLOBIN A1C: 7.8 % — AB (ref <5.7)
Mean Plasma Glucose: 177 mg/dL — ABNORMAL HIGH (ref <117)

## 2014-05-19 LAB — BASIC METABOLIC PANEL WITH GFR
BUN: 11 mg/dL (ref 6–23)
CALCIUM: 9.1 mg/dL (ref 8.4–10.5)
CO2: 26 mEq/L (ref 19–32)
CREATININE: 0.62 mg/dL (ref 0.50–1.10)
Chloride: 106 mEq/L (ref 96–112)
GFR, Est Non African American: >89 mL/min
GLUCOSE: 139 mg/dL — AB (ref 70–99)
Potassium: 3.9 mEq/L (ref 3.5–5.3)
SODIUM: 139 meq/L (ref 135–145)

## 2014-05-19 LAB — CBC WITH DIFFERENTIAL/PLATELET
Basophils Absolute: 0 10*3/uL (ref 0.0–0.1)
Basophils Relative: 1 % (ref 0–1)
EOS ABS: 0.1 10*3/uL (ref 0.0–0.7)
EOS PCT: 2 % (ref 0–5)
HCT: 39.5 % (ref 36.0–46.0)
HEMOGLOBIN: 13 g/dL (ref 12.0–15.0)
LYMPHS ABS: 1.1 10*3/uL (ref 0.7–4.0)
LYMPHS PCT: 34 % (ref 12–46)
MCH: 34.1 pg — AB (ref 26.0–34.0)
MCHC: 32.9 g/dL (ref 30.0–36.0)
MCV: 103.7 fL — AB (ref 78.0–100.0)
MONO ABS: 0.3 10*3/uL (ref 0.1–1.0)
MONOS PCT: 10 % (ref 3–12)
MPV: 10.9 fL (ref 8.6–12.4)
Neutro Abs: 1.7 10*3/uL (ref 1.7–7.7)
Neutrophils Relative %: 53 % (ref 43–77)
Platelets: 99 10*3/uL — ABNORMAL LOW (ref 150–400)
RBC: 3.81 MIL/uL — AB (ref 3.87–5.11)
RDW: 13.8 % (ref 11.5–15.5)
WBC: 3.2 10*3/uL — ABNORMAL LOW (ref 4.0–10.5)

## 2014-05-19 LAB — LIPID PANEL
CHOL/HDL RATIO: 2.8 ratio
Cholesterol: 203 mg/dL — ABNORMAL HIGH (ref 0–200)
HDL: 73 mg/dL (ref >=46)
LDL Cholesterol: 110 mg/dL — ABNORMAL HIGH (ref 0–99)
Triglycerides: 98 mg/dL (ref <150)
VLDL: 20 mg/dL (ref 0–40)

## 2014-05-19 LAB — VITAMIN D 25 HYDROXY (VIT D DEFICIENCY, FRACTURES): Vit D, 25-Hydroxy: 30 ng/mL (ref 30–100)

## 2014-05-19 LAB — TSH: TSH: 9.201 u[IU]/mL — AB (ref 0.350–4.500)

## 2014-05-19 LAB — INSULIN, FASTING: INSULIN FASTING, SERUM: 31 u[IU]/mL — AB (ref 2.0–19.6)

## 2014-05-19 MED ORDER — LEVOTHYROXINE SODIUM 150 MCG PO TABS: ORAL_TABLET | ORAL | Status: DC

## 2014-05-31 ENCOUNTER — Other Ambulatory Visit: Payer: Self-pay | Admitting: *Deleted

## 2014-06-01 ENCOUNTER — Other Ambulatory Visit: Payer: Self-pay | Admitting: *Deleted

## 2014-06-01 MED ORDER — ACCU-CHEK AVIVA PLUS W/DEVICE KIT: PACK | Status: DC

## 2014-06-01 MED ORDER — ACCU-CHEK SOFT TOUCH LANCETS MISC: Status: DC

## 2014-06-01 MED ORDER — GLUCOSE BLOOD VI STRP: ORAL_STRIP | Status: DC

## 2014-06-01 MED ORDER — SITAGLIPTIN PHOS-METFORMIN HCL 50-1000 MG PO TABS: 1.0000 | ORAL_TABLET | Freq: Two times a day (BID) | ORAL | Status: DC

## 2014-06-01 MED ORDER — ACCU-CHEK SOFTCLIX LANCET DEV MISC: Status: DC

## 2014-06-12 ENCOUNTER — Encounter: Payer: Self-pay | Admitting: *Deleted

## 2014-06-27 ENCOUNTER — Ambulatory Visit: Payer: Self-pay

## 2014-06-29 ENCOUNTER — Other Ambulatory Visit: Payer: Self-pay

## 2014-06-29 ENCOUNTER — Telehealth: Payer: Self-pay

## 2014-06-29 ENCOUNTER — Ambulatory Visit (INDEPENDENT_AMBULATORY_CARE_PROVIDER_SITE_OTHER): Payer: Medicare HMO

## 2014-06-29 DIAGNOSIS — E039 Hypothyroidism, unspecified: Secondary | ICD-10-CM

## 2014-06-29 LAB — TSH: TSH: 0.021 u[IU]/mL — ABNORMAL LOW (ref 0.350–4.500)

## 2014-06-29 NOTE — Progress Notes (Signed)
Patient ID: Beverly Davis, female   DOB: November 22, 1940, 74 y.o.   MRN: [id number] Patient here today to have her thyroid re-checked. Patient is currently on Levothyroxine 150 mcg and takes one and a half tablets on Mon , Tues and Wed and takes only one tablet Thur, Fri, Sat and Sunday.

## 2014-06-29 NOTE — Telephone Encounter (Signed)
Spoke with patient today regarding her Janumet, verified with her today with Starlyn Skeans PA that she is to take the Janumet 50-1000 one tablet twice daily with meals. She verbalized understanding and wrote it down

## 2014-07-13 ENCOUNTER — Encounter: Payer: Self-pay | Admitting: Physician Assistant

## 2014-08-29 ENCOUNTER — Other Ambulatory Visit: Payer: Self-pay | Admitting: Internal Medicine

## 2014-08-29 DIAGNOSIS — Z853 Personal history of malignant neoplasm of breast: Secondary | ICD-10-CM

## 2014-08-30 ENCOUNTER — Encounter: Payer: Self-pay | Admitting: Physician Assistant

## 2014-08-30 ENCOUNTER — Ambulatory Visit (INDEPENDENT_AMBULATORY_CARE_PROVIDER_SITE_OTHER): Payer: Medicare HMO | Admitting: Physician Assistant

## 2014-08-30 VITALS — BP 110/70 | HR 76 | Temp 97.7°F | Resp 16 | Ht 62.0 in | Wt 148.0 lb

## 2014-08-30 DIAGNOSIS — E1129 Type 2 diabetes mellitus with other diabetic kidney complication: Secondary | ICD-10-CM

## 2014-08-30 DIAGNOSIS — E785 Hyperlipidemia, unspecified: Secondary | ICD-10-CM

## 2014-08-30 DIAGNOSIS — R030 Elevated blood-pressure reading, without diagnosis of hypertension: Secondary | ICD-10-CM

## 2014-08-30 DIAGNOSIS — Z23 Encounter for immunization: Secondary | ICD-10-CM

## 2014-08-30 DIAGNOSIS — Z0001 Encounter for general adult medical examination with abnormal findings: Secondary | ICD-10-CM

## 2014-08-30 DIAGNOSIS — R6889 Other general symptoms and signs: Secondary | ICD-10-CM

## 2014-08-30 DIAGNOSIS — R232 Flushing: Secondary | ICD-10-CM

## 2014-08-30 DIAGNOSIS — E039 Hypothyroidism, unspecified: Secondary | ICD-10-CM

## 2014-08-30 DIAGNOSIS — Z789 Other specified health status: Secondary | ICD-10-CM

## 2014-08-30 DIAGNOSIS — Z79899 Other long term (current) drug therapy: Secondary | ICD-10-CM

## 2014-08-30 DIAGNOSIS — C50911 Malignant neoplasm of unspecified site of right female breast: Secondary | ICD-10-CM

## 2014-08-30 DIAGNOSIS — N951 Menopausal and female climacteric states: Secondary | ICD-10-CM

## 2014-08-30 DIAGNOSIS — IMO0001 Reserved for inherently not codable concepts without codable children: Secondary | ICD-10-CM

## 2014-08-30 DIAGNOSIS — E559 Vitamin D deficiency, unspecified: Secondary | ICD-10-CM

## 2014-08-30 DIAGNOSIS — M8580 Other specified disorders of bone density and structure, unspecified site: Secondary | ICD-10-CM

## 2014-08-30 LAB — HEPATIC FUNCTION PANEL
ALBUMIN: 3.4 g/dL — AB (ref 3.5–5.2)
ALK PHOS: 91 U/L (ref 39–117)
ALT: 36 U/L — ABNORMAL HIGH (ref 0–35)
AST: 43 U/L — ABNORMAL HIGH (ref 0–37)
BILIRUBIN DIRECT: 0.2 mg/dL (ref 0.0–0.3)
BILIRUBIN INDIRECT: 0.7 mg/dL (ref 0.2–1.2)
BILIRUBIN TOTAL: 0.9 mg/dL (ref 0.2–1.2)
TOTAL PROTEIN: 6.5 g/dL (ref 6.0–8.3)

## 2014-08-30 LAB — LIPID PANEL
Cholesterol: 179 mg/dL (ref 0–200)
HDL: 62 mg/dL (ref >=46)
LDL Cholesterol: 101 mg/dL — ABNORMAL HIGH (ref 0–99)
Total CHOL/HDL Ratio: 2.9 Ratio
Triglycerides: 81 mg/dL (ref <150)
VLDL: 16 mg/dL (ref 0–40)

## 2014-08-30 LAB — BASIC METABOLIC PANEL WITH GFR
BUN: 11 mg/dL (ref 6–23)
CALCIUM: 9.6 mg/dL (ref 8.4–10.5)
CO2: 23 meq/L (ref 19–32)
CREATININE: 0.65 mg/dL (ref 0.50–1.10)
Chloride: 105 mEq/L (ref 96–112)
GFR, EST NON AFRICAN AMERICAN: 89 mL/min
GFR, Est African American: >89 mL/min
Glucose, Bld: 193 mg/dL — ABNORMAL HIGH (ref 70–99)
Potassium: 4.3 mEq/L (ref 3.5–5.3)
Sodium: 137 mEq/L (ref 135–145)

## 2014-08-30 LAB — CBC WITH DIFFERENTIAL/PLATELET
BASOS ABS: 0 10*3/uL (ref 0.0–0.1)
Basophils Relative: 1 % (ref 0–1)
Eosinophils Absolute: 0.1 10*3/uL (ref 0.0–0.7)
Eosinophils Relative: 3 % (ref 0–5)
HCT: 37 % (ref 36.0–46.0)
HEMOGLOBIN: 12.7 g/dL (ref 12.0–15.0)
Lymphocytes Relative: 26 % (ref 12–46)
Lymphs Abs: 0.8 10*3/uL (ref 0.7–4.0)
MCH: 34.4 pg — AB (ref 26.0–34.0)
MCHC: 34.3 g/dL (ref 30.0–36.0)
MCV: 100.3 fL — ABNORMAL HIGH (ref 78.0–100.0)
MPV: 10.1 fL (ref 8.6–12.4)
Monocytes Absolute: 0.4 10*3/uL (ref 0.1–1.0)
Monocytes Relative: 12 % (ref 3–12)
NEUTROS ABS: 1.8 10*3/uL (ref 1.7–7.7)
Neutrophils Relative %: 58 % (ref 43–77)
PLATELETS: 102 10*3/uL — AB (ref 150–400)
RBC: 3.69 MIL/uL — ABNORMAL LOW (ref 3.87–5.11)
RDW: 13.8 % (ref 11.5–15.5)
WBC: 3.1 10*3/uL — AB (ref 4.0–10.5)

## 2014-08-30 LAB — HEMOGLOBIN A1C
Hgb A1c MFr Bld: 7.9 % — ABNORMAL HIGH (ref <5.7)
MEAN PLASMA GLUCOSE: 180 mg/dL — AB (ref <117)

## 2014-08-30 LAB — TSH: TSH: <0.008 u[IU]/mL — ABNORMAL LOW (ref 0.350–4.500)

## 2014-08-30 LAB — MAGNESIUM: Magnesium: 1.8 mg/dL (ref 1.5–2.5)

## 2014-08-30 NOTE — Patient Instructions (Addendum)
Please pick one of the over the counter allergy medications below and take it once daily for allergies.  Claritin or loratadine cheapest but likely the weakest  Zyrtec or certizine at night because it can make you sleepy The strongest is allegra or fexafinadine  Cheapest at [employer], [employer], [employer]  Schedule colonoscopy  Diabetes is a very complicated disease...lets simplify it.  An easy way to look at it to understand the complications is if you think of the extra sugar floating in your blood stream as glass shards floating through your blood stream.    Diabetes affects your small vessels first: 1) The glass shards (sugar) scraps down the tiny blood vessels in your eyes and lead to diabetic retinopathy, the leading cause of blindness in the [place]. Diabetes is the leading cause of newly diagnosed adult (61 to 73 years of age) blindness in the [place].  2) The glass shards scratches down the tiny vessels of your legs leading to nerve damage called neuropathy and can lead to amputations of your feet. More than 60% of all non-traumatic amputations of lower limbs occur in people with diabetes.  3) Over time the small vessels in your brain are shredded and closed off, individually this does not cause any problems but over a long period of time many of the small vessels being blocked can lead to Vascular Dementia.   4) Your kidney's are a filter system and have a "net" that keeps certain things in the body and lets bad things out. Sugar shreds this net and leads to kidney damage and eventually failure. Decreasing the sugar that is destroying the net and certain blood pressure medications can help stop or decrease progression of kidney disease. Diabetes was the primary cause of kidney failure in 44 percent of all new cases in 2011.  5) Diabetes also destroys the small vessels in your penis that lead to erectile dysfunction. Eventually the vessels are so damaged that you may not be responsive to cialis  or viagra.   Diabetes and your large vessels: Your larger vessels consist of your coronary arteries in your heart and the carotid vessels to your brain. Diabetes or even increased sugars put you at 300% increased risk of heart attack and stroke and this is why.. The sugar scrapes down your large blood vessels and your body sees this as an internal injury and tries to repair itself. Just like you get a scab on your skin, your platelets will stick to the blood vessel wall trying to heal it. This is why we have diabetics on low dose aspirin daily, this prevents the platelets from sticking and can prevent plaque formation. In addition, your body takes cholesterol and tries to shove it into the open wound. This is why we want your LDL, or bad cholesterol, below 70.   The combination of platelets and cholesterol over 5-10 years forms plaque that can break off and cause a heart attack or stroke.   PLEASE REMEMBER:  Diabetes is preventable! Up to 79 percent of complications and morbidities among individuals with type 2 diabetes can be prevented, delayed, or effectively treated and minimized with regular visits to a health professional, appropriate monitoring and medication, and a healthy diet and lifestyle.   Your A1C is a measure of your sugar over the past 3 months and is not affected by what you have eaten over the past few days. Diabetes increases your chances of stroke and heart attack over 300 % and is the leading cause  of blindness and kidney failure in the [place]. Please make sure you decrease bad carbs like white bread, white rice, potatoes, corn, soft drinks, pasta, cereals, refined sugars, sweet tea, dried fruits, and fruit juice. Good carbs are okay to eat in moderation like sweet potatoes, brown rice, whole grain pasta/bread, most fruit (except dried fruit) and you can eat as many veggies as you want.   Greater than 6.5 is considered diabetic. Between 6.4 and 5.7 is prediabetic If your A1C  is less than 5.7 you are NOT diabetic.  Targets for Glucose Readings: Time of Check Target for patients WITHOUT Diabetes Target for DIABETICS  Before Meals Less than 100  less than 150  Two hours after meals Less than 200  Less than 250    Recommendations For Diabetic/Prediabetic Patients:   -  Take medications as prescribed  -  Recommend Dr Fara Olden Fuhrman's book "The End of Diabetes "  And "The End of Dieting"- Can get at  [email or web address].com and encourage also get the Audio CD book  - AVOID Animal products, ie. Meat - red/white, Poultry and Dairy/especially cheese - Exercise at least 5 times a week for 30 minutes or preferably daily.  - No Smoking - Drink less than 2 drinks a day.  - Monitor your feet for sores - Have yearly Eye Exams - Recommend annual Flu vaccine  - Recommend Pneumovax and Prevnar vaccines - Shingles Vaccine (Zostavax) if over 46 y.o.  Goals:   - BMI less than 24 - Fasting sugar less than 130 or less than 150 if tapering medicines to lose weight  - Systolic BP less than 500  - Diastolic BP less than 80 - Bad LDL Cholesterol less than 70 - Triglycerides less than 150  Varicose Veins Varicose veins are veins that have become enlarged and twisted. CAUSES This condition is the result of valves in the veins not working properly. Valves in the veins help return blood from the leg to the heart. When your calf muscles squeeze, the blood moves up your leg then the valves close and this continues until the blood gets back to your heart.  If these valves are damaged, blood flows backwards and backs up into the veins in the leg near the skin OR if your are sitting/standing for a long time without using your calf muscles the blood will back up into the veins in your legs. This causes the veins to become larger. People who are on their feet a lot, sit a lot without walking (like on a plane, at a desk, or in a car), who are pregnant, or who are overweight are more likely to develop  varicose veins. SYMPTOMS   Bulging, twisted-appearing, bluish veins, most commonly found on the legs.  Leg pain or a feeling of heaviness. These symptoms may be worse at the end of the day.  Leg swelling.  Skin color changes. DIAGNOSIS  Varicose veins can usually be diagnosed with an exam of your legs by your caregiver. He or she may recommend an ultrasound of your leg veins. TREATMENT  Most varicose veins can be treated at home.However, other treatments are available for people who have persistent symptoms or who want to treat the cosmetic appearance of the varicose veins. But this is only cosmetic and they will return if not properly treated. These include:  Laser treatment of very small varicose veins.  Medicine that is shot (injected) into the vein. This medicine hardens the walls of the vein and closes off  the vein. This treatment is called sclerotherapy. Afterwards, you may need to wear clothing or bandages that apply pressure.  Surgery. HOME CARE INSTRUCTIONS   Do not stand or sit in one position for long periods of time. Do not sit with your legs crossed. Rest with your legs raised during the day.  Your legs have to be higher than your heart so that gravity will force the valves to open, so please really elevate your legs.   Wear elastic stockings or support hose. Do not wear other tight, encircling garments around the legs, pelvis, or waist.  [employer]  has a wide variety of well priced compression stockings. [place], [place] [address] [phone]  Walk as much as possible to increase blood flow.  Raise the foot of your bed at night with 2-inch blocks.  If you get a cut in the skin over the vein and the vein bleeds, lie down with your leg raised and press on it with a clean cloth until the bleeding stops. Then place a bandage (dressing) on the cut. See your caregiver if it continues to bleed or needs stitches. SEEK MEDICAL CARE IF:   The skin  around your ankle starts to break down.  You have pain, redness, tenderness, or hard swelling developing in your leg over a vein.  You are uncomfortable due to leg pain. Document Released: 12/19/2004 Document Revised: 06/03/2011 Document Reviewed: 05/07/2010 [facility] [place] Patient Information 2014 [place].

## 2014-08-30 NOTE — Progress Notes (Signed)
Medicare wellness and 3 month OV Assessment:   1. Type 2 diabetes mellitus with other diabetic kidney complication Discussed general issues about diabetes pathophysiology and management., Educational material distributed., Suggested low cholesterol diet., Encouraged aerobic exercise., Discussed foot care., Reminded to get yearly retinal exam. - Hemoglobin A1c  2. Hypothyroidism, unspecified hypothyroidism type Hypothyroidism-check TSH level, continue medications the same, reminded to take on an empty stomach 30-40mns before food.  May be on too much, start on 1/2 pill 2 days a week and 1 pill the other days - TSH  3. Elevated BP - continue medications, DASH diet, exercise and monitor at home. Call if greater than 130/80.  - CBC with Differential/Platelet - BASIC METABOLIC PANEL WITH GFR - Hepatic function panel  4. Osteopenia Off fosamax, started 2008 stopped last year, will get DEXA and may need to restart fosamax in the future Continue vitamin D/calcium and start weight bearing exercises.  - DG Bone Density; Future  5. Breast cancer, right Get MGM  6. Hot flashes Controlled/better  7. Vitamin D deficiency - Vit D  25 hydroxy (rtn osteoporosis monitoring)  8. Medication management - Magnesium  9. Hyperlipidemia LDL goal <70 -continue medications, check lipids, decrease fatty foods, increase activity.  - Lipid panel  10. Need for prophylactic vaccination against Streptococcus pneumoniae (pneumococcus) - Pneumococcal polysaccharide vaccine 23-valent greater than or equal to 2yo subcutaneous/IM   Plan:   During the course of the visit the patient was educated and counseled about appropriate screening and preventive services including:    Pneumococcal vaccine   Influenza vaccine  Td vaccine  Screening electrocardiogram  Screening mammography  Bone densitometry screening  Colorectal cancer screening  Diabetes screening  Glaucoma screening  Nutrition  counseling    Conditions/risks identified: BMI: Discussed weight loss, diet, and increase physical activity.  Increase physical activity: AHA recommends 150 minutes of physical activity a week.  Medications reviewed DEXA- next year, stop fosamax, has been greater than 5 years Diabetes at goal, ACE/ARB therapy no due to hypotension. Urinary Incontinence is not an issue: discussed non pharmacology and pharmacology options.  Fall risk: moderate- discussed PT, home fall assessment, medications.   Subjective:   Beverly FINCKis a 74y.o. female who presents for Medicare Annual Wellness Visit and 3 month f/u   Date of last medicare wellness visit was 07/07/2013  Her blood pressure has been controlled at home, today their BP is BP: 110/70 mmHg She does not workout but she babysits her grandkids age 199and 864 She denies chest pain, shortness of breath, dizziness.  She has had nonproductive cough at night/in AM, was on an allergy pill which helped but continues to have.  She is not on cholesterol medication. Her cholesterol is not at goal but she refuses statins. The cholesterol last visit was:  Lab Results  Component Value Date   CHOL 203* 05/18/2014   HDL 73 05/18/2014   LDLCALC 110* 05/18/2014   TRIG 98 05/18/2014   CHOLHDL 2.8 05/18/2014   She has been working on diet and exercise for diabetes,  She uses a freestyle "freedom", she has had a 200 in the AM, denies hypoglycemia, she is on bASA, she is not on ACE due to hypotension, she is on glipizde 555m2 in the AM and 1 at night, and she is on janumet 1/2 AM and PM and denies polydipsia, polyuria and visual disturbances. Last A1C in the office was:  Lab Results  Component Value Date   HGBA1C 7.8*  05/18/2014    Patient is on Vitamin D supplement.  Lab Results  Component Value Date   VD25OH 30 05/18/2014    She is on thyroid medication. Her medication was changed last visit, she is on 132mg, last visit her medication was increased  to 1.5 tablets 2 days a week and 1 tablet daily from 1 pill daily.  Patient denies diarrhea, heat / cold intolerance and nervousness.  Lab Results  Component Value Date   TSH 0.021* 06/29/2014   She has been on Fosamax since 2008 for osteoporosis, stopped last year, due for fosamax this year.  She has a history of right breast cancer.  BMI is Body mass index is 27.06 kg/(m^2)., she is working on diet and exercise. Wt Readings from Last 3 Encounters:  08/30/14 148 lb (67.132 kg)  05/18/14 153 lb (69.4 kg)  01/12/14 155 lb (70.308 kg)    Names of Other Physician/Practitioners you currently use: 1. [facility]- here for primary care 2. Dr. ETyna Jaksch eye doctor, last visit 1 year 3. Dr. KNolon Lennert dentist, last visit 1-2 months ago.  Dr. RAudelia HivesPatient Care Team: WUnk Pinto MD as PCP - General (Internal Medicine) PCarol Ada MD as Consulting Physician (Gastroenterology) RInda Castle MD as Consulting Physician (Gastroenterology)   Medication Review Current Outpatient Prescriptions on File Prior to Visit  Medication Sig Dispense Refill  . alendronate (FOSAMAX) 35 MG tablet Take 1 tablet (35 mg total) by mouth every 7 (seven) days. Take with full glass of water. 4 tablet 6  . Blood Glucose Monitoring Suppl (ACCU-CHEK AVIVA PLUS) W/DEVICE KIT check blood glucose 1 time daily-DX-E11.29 1 kit 0  . calcium carbonate (OS-CAL) 600 MG TABS Take 600 mg by mouth 2 (two) times daily with a meal.      . Cholecalciferol (VITAMIN D) 2000 UNITS tablet Take 4,000 Units by mouth 2 (two) times daily.    . Flaxseed, Linseed, 1300 MG CAPS Take 1 capsule by mouth 2 (two) times daily.    .Marland KitchenglipiZIDE (GLUCOTROL) 5 MG tablet TAKE ONE TABLET BY MOUTH THREE TIMES DAILY BEFORE MEAL(S) 270 tablet 3  . glucose blood (ACCU-CHEK AVIVA) test strip Use as instructed 100 each 12  . [START ON 02/21/2015] Lancet Devices (ACCU-CHEK SOFTCLIX) lancets Use as instructed 1 each 0  .  Lancets (ACCU-CHEK SOFT TOUCH) lancets Check blood glucose 1 time daily-DX-E11.29 100 each 12  . levothyroxine (SYNTHROID, LEVOTHROID) 150 MCG tablet Take 1 to 1 1/2 tabs daily as directed. 135 tablet 1  . Omega-3 Fatty Acids (FISH OIL) 1200 MG CAPS Take 1 capsule by mouth 2 (two) times daily.    . sitaGLIPtin-metformin (JANUMET) 50-1000 MG per tablet Take 1 tablet by mouth 2 (two) times daily with a meal. 28 tablet 0   No current facility-administered medications on file prior to visit.    Current Problems (verified) Patient Active Problem List   Diagnosis Date Noted  . Hypothyroidism 05/18/2014  . Elevated BP 05/18/2014  . Vitamin D deficiency 05/18/2014  . Medication management 05/18/2014  . 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 Osteopenia   . Breast cancer 09/30/2012  . Hot flashes 03/06/2011    Screening Tests Health Maintenance  Topic Date Due  . COLONOSCOPY  10/08/1991  . ZOSTAVAX  10/07/2001  . FOOT EXAM  07/08/2014  . URINE MICROALBUMIN  07/08/2014  . MAMMOGRAM  10/15/2014  . INFLUENZA VACCINE  10/24/2014  . HEMOGLOBIN A1C  11/16/2014  . PNA vac Low Risk Adult (  2 of 2 - PPSV23) 01/13/2015  . OPHTHALMOLOGY EXAM  06/01/2015  . TETANUS/TDAP  06/11/2022  . DEXA SCAN  Completed    Immunization History  Administered Date(s) Administered  . Influenza Split 03/06/2011, 01/06/2013  . Influenza, High Dose Seasonal PF 01/12/2014  . Pneumococcal Conjugate-13 01/12/2014  . Tdap 06/10/2012    Preventative care: Last colonoscopy: 2006 due 2016 Last mammogram: 09/2012 has appointment Thursday.  Last pap smear/pelvic exam: remote DEXA: 11/2012 due next year Thyroid scan 2011 AB [place] 2013  Prior vaccinations: TD or Tdap: 05/2012  Influenza: 2015 Pneumococcal: DUE Prevnar 13: 2015 Shingles/Zostavax: declines due to cost  Allergies Allergies  Allergen Reactions  . Metformin And Related   . Welchol [Colesevelam Hcl]    Surgical history Past Surgical History  Procedure  Laterality Date  . Breast lumpectomy with axillary lymph node biopsy Right 06/25/2006  . Cholecystectomy    . Abdominal hysterectomy      TAH BSO  . Tubal ligation  1978   Family history Family History  Problem Relation Age of Onset  . Cancer Mother     Brain cancer  . Cancer Father     Prostate cancer  . Diabetes Sister   . Hypertension Sister   . Liver disease Brother   . Diabetes Maternal Aunt   . Heart Problems Maternal Grandfather   . Cancer Paternal Grandfather     Unknown cancer  . Diabetes Sister     Risk Factors: Osteoporosis: postmenopausal estrogen deficiency and dietary calcium and/or vitamin D deficiency History of fracture in the past year: no  Tobacco History  Substance Use Topics  . Smoking status: Never Smoker   . Smokeless tobacco: Never Used  . Alcohol Use: No   She does not smoke.  Patient is not a former smoker. Are there smokers in your home (other than you)?  No  Alcohol Current alcohol use: none  Caffeine Current caffeine use: coffee 1-2 /day  Exercise Current exercise habits: The patient does not participate in regular exercise at present.  Current exercise: playing with her grandkids  Nutrition/Diet Current diet: in general, a "healthy" diet    Cardiac risk factors: advanced age (older than 35 for men, 64 for women), diabetes mellitus, dyslipidemia, family history of premature cardiovascular disease, hypertension and obesity (BMI >= 30 kg/m2).  Depression Screen (Note: if answer to either of the following is "Yes", a more complete depression screening is indicated)   Q1: Over the past two weeks, have you felt down, depressed or hopeless? No  Q2: Over the past two weeks, have you felt little interest or pleasure in doing things? No  Have you lost interest or pleasure in daily life? No  Do you often feel hopeless? No  Do you cry easily over simple problems? No  Activities of Daily Living In your present state of health, do you have  any difficulty performing the following activities?:  Driving? No Managing money?  No Feeding yourself? No Getting from bed to chair? No Climbing a flight of stairs? No Preparing food and eating?: No Bathing or showering? No Getting dressed: No Getting to the toilet? No Using the toilet:No Moving around from place to place: No In the past year have you fallen or had a near fall?:yes, once   Are you sexually active?  No  Do you have more than one partner?  No  Vision Difficulties: No, wears glasses  Hearing Difficulties: No Do you often ask people to speak up or repeat  themselves? No Do you experience ringing or noises in your ears? No Do you have difficulty understanding soft or whispered voices? No  Cognition  Do you feel that you have a problem with memory?No  Do you often misplace items? No  Do you feel safe at home?  Yes  Advanced directives Does patient have a [place]? No Does patient have a Living Will? No    Objective:   Blood pressure 110/70, pulse 76, temperature 97.7 F (36.5 C), resp. rate 16, height 5' 2"  (1.575 m), weight 148 lb (67.132 kg). Body mass index is 27.06 kg/(m^2).  General appearance: alert, no distress, WD/WN,  female Cognitive Testing  Alert? Yes  Normal Appearance?Yes  Oriented to person? Yes  Place? Yes   Time? Yes  Recall of three objects?  Yes  Can perform simple calculations? Yes  Displays appropriate judgment?Yes  Can read the correct time from a watch face?Yes  General appearance: alert and mildly obese Head: Normocephalic, without obvious abnormality, atraumatic Eyes: conjunctivae/corneas clear. PERRL, EOM's intact. Fundi benign. Ears: normal TM's and external ear canals both ears Nose: Nares normal. Septum midline. Mucosa normal. No drainage or sinus tenderness. Throat: lips, mucosa, and tongue normal; teeth and gums normal Neck: no adenopathy, no carotid bruit, no JVD, supple, symmetrical, trachea  midline and thyroid not enlarged, symmetric, no tenderness/mass/nodules Lungs: clear to auscultation bilaterally Breasts: defer Dr. Valentino Hue Heart: regular rate and rhythm, S2: increased intensity and systolic murmur: late systolic 3/6, decrescendo at 2nd left intercostal space Abdomen: soft, non-tender; bowel sounds normal; no masses,  no organomegaly and obese Extremities: extremities normal, atraumatic, no cyanosis or edema and no ulcers, gangrene or trophic changes Pulses: 2+ and symmetric Skin: Skin color, texture, turgor normal. No rashes or lesions Lymph nodes: Cervical, supraclavicular, and axillary nodes normal. Neurologic: Grossly normal   Medicare Attestation I have personally reviewed: The patient's medical and social history Their use of alcohol, tobacco or illicit drugs Their current medications and supplements The patient's functional ability including ADLs,fall risks, home safety risks, cognitive, and hearing and visual impairment Diet and physical activities Evidence for depression or mood disorders  The patient's weight, height, BMI, and visual acuity have been recorded in the chart.  I have made referrals, counseling, and provided education to the patient based on review of the above and I have provided the patient with a written personalized care plan for preventive services.     Vicie Mutters, PA-C   08/30/2014

## 2014-08-31 ENCOUNTER — Encounter: Payer: Self-pay | Admitting: Physician Assistant

## 2014-08-31 ENCOUNTER — Ambulatory Visit: Payer: Self-pay | Admitting: Physician Assistant

## 2014-08-31 LAB — VITAMIN D 25 HYDROXY (VIT D DEFICIENCY, FRACTURES): Vit D, 25-Hydroxy: 54 ng/mL (ref 30–100)

## 2014-09-01 ENCOUNTER — Ambulatory Visit
Admission: RE | Admit: 2014-09-01 | Discharge: 2014-09-01 | Disposition: A | Discharge status: Home / Self Care | Payer: Medicare HMO | Source: Ambulatory Visit | Attending: Internal Medicine | Admitting: Internal Medicine

## 2014-09-01 ENCOUNTER — Other Ambulatory Visit: Payer: Self-pay | Admitting: Internal Medicine

## 2014-09-01 DIAGNOSIS — Z853 Personal history of malignant neoplasm of breast: Secondary | ICD-10-CM

## 2014-09-21 ENCOUNTER — Ambulatory Visit (INDEPENDENT_AMBULATORY_CARE_PROVIDER_SITE_OTHER): Payer: Medicare HMO | Admitting: Internal Medicine

## 2014-09-21 ENCOUNTER — Ambulatory Visit: Payer: Self-pay | Admitting: Internal Medicine

## 2014-09-21 VITALS — BP 118/62 | HR 76 | Temp 98.0°F | Resp 16 | Ht 62.0 in | Wt 151.0 lb

## 2014-09-21 DIAGNOSIS — E1129 Type 2 diabetes mellitus with other diabetic kidney complication: Secondary | ICD-10-CM

## 2014-09-21 MED ORDER — LEVOTHYROXINE SODIUM 150 MCG PO TABS: ORAL_TABLET | ORAL | Status: DC

## 2014-09-21 NOTE — Progress Notes (Signed)
I have reviewed diabetes in detail with the patient today. All questions have been answered to her satisfaction. We have discussed the following concepts: diabetic diet discussed in detail, written exchange diet given, low cholesterol diet, weight control and daily exercise discussed, home glucose monitoring emphasized, home glucose monitoring demonstrated and taught, glucose meter dispensed to patient, all medications, side effects and compliance discussed carefully, foot care discussed and Podiatry visits discussed, annual eye examinations at Ophthalmology discussed, long term diabetic complications discussed and labs immediately prior to next visit. The diabetic Sick Day rules are reviewed with her verbally and in writing. If following usual diet, stay on same dose of diabetic medication, maintain high fluid intake, and perform home glucose monitoring QID. If not able to maintain normal diet due to illness glucometer readings QID until well and stable, maintain hydration with high fluid intake, call MD if glucose above 400 or below 60. The patient was able to demonstrate adequate skill with doing home glucose monitoring.  Patient does appear to have some issues with comprehension of diabetes and the way that we measure.  Handouts with goals for diabetic therapy have been given to the patient and the patient states understanding that I would like her to check her blood sugars twice daily once first thing in the morning and one random time during the day.  She is to keep a log and bring it when she returns.  Patient also given a modified diet which she can go through and follow.  She states understanding.  In total I have spent 45 minutes teaching and explaining diabetes to the patient and how she should proceed.  We will recheck at next appointment.    Filed Vitals:   09/21/14 1617  BP: 118/62  Pulse: 76  Temp: 98 F (36.7 C)  Resp: 16   CBG:  186 4 hours post pradial  General:  Alert and oriented,  NAD, obese Eyes: normal conjunctiva, normal EOM Mouth: MM moist and pink Heart:  RRR no MRG Lungs: CTA with no rales, rhonchi or wheeze Abdomen soft and nontender, +BS Psych: poor memory and insight   Recheck in 2 months      Beverly Davis 09/22/2014 3:14 PM

## 2014-09-21 NOTE — Patient Instructions (Signed)
Your A1C is a measure of your sugar over the past 3 months and is not affected by what you have eaten over the past few days. Diabetes increases your chances of stroke and heart attack over 300 % and is the leading cause of blindness and kidney failure in the [place]. Please make sure you decrease bad carbs like white bread, white rice, potatoes, corn, soft drinks, pasta, cereals, refined sugars, sweet tea, dried fruits, and fruit juice. Good carbs are okay to eat in moderation like sweet potatoes, brown rice, whole grain pasta/bread, most fruit (except dried fruit) and you can eat as many veggies as you want.   Greater than 6.5 is considered diabetic. Between 6.4 and 5.7 is prediabetic If your A1C is less than 5.7 you are NOT diabetic.  Targets for Glucose Readings: Time of Check Target for patients WITHOUT Diabetes Target for DIABETICS  Before Meals Less than 100  less than 150  Two hours after meals Less than 200  Less than 250   Targets for Glucose Readings: Time of Check Usual Target for Most People  Before Meals  70-130  Two hours after meals  Less than 180  Bedtime  90-150   Why Should You Check Your Blood Glucose? -The A1C tells you how your diabetes is doing over a 3 month period.  Home blood glucose monitoring (or checking) gives you information about your diabetes on a daily basis.  You will learn how well your diabetes care plan is working and whether your blood glucose is in your target range throughout the day.   -Reviewing daily blood glucose levels will help you and your healthcare team make any needed changes to you meal plan, physical activity and medications.  Meter Supplies: - A glucose meter was provided at your visit today along with strips and lancets. The blood glucose meter strips and lancets are usually covered by health insurance. Please let us know if they are not and contact your insurance to see which meter they prefer.  How to get a good blood  sample: -Wash your hands in warm water.  You do not need to use alcohol wipes. -Massage your hands. -Choose which finger you will use.  It helps to use a different finger each time to avoid soreness. -Keep you hand below your wrist when using you lancet to "prick" your finger. -Apply gentle pressure, but do NOT squeeze your finger.  Checking your blood glucose Important Reminders: -Make sure your strips are not expired.  Check the date on the bottle. -Make sure the code on bottle matches the code on your machine. -Make sure your hands are clean and dry. -Do not use the center of your finger, it is the most sensitive area.  Use a spot to the side of the center of your fingertip. -Completely fill the strip target area with blood (until it beeps) to make sure the results are accurate. -You will need a prescription to have your glucose strips and lancets covered by insurance. -There is usually an 800 number on the back of your meter for help with meter issues.  How often to check: -If you take diabetes pills or take one injection of insulin each day, you will usually be asked to check twice a day, before breakfast and 2 hours after one meal, or as directed. -If you take several insulin injections each day, you will usually be asked to check four times a day before meals and at bedtime every day.  Recommendations  For Diabetic/Prediabetic Patients:   -  Take medications as prescribed  -  Recommend Dr Fara Olden Fuhrman's book "The End of Diabetes "  And "The End of Dieting"- Can get at  [email or web address].com and encourage also get the Audio CD book  - AVOID Animal products, ie. Meat - red/white, Poultry and Dairy/especially cheese - Exercise at least 5 times a week for 30 minutes or preferably daily.  - No Smoking - Drink less than 2 drinks a day.  - Monitor your feet for sores - Have yearly Eye Exams - Recommend annual Flu vaccine  - Recommend Pneumovax and Prevnar vaccines - Shingles Vaccine  (Zostavax) if over 42 y.o.  Goals:   - BMI less than 24 - Fasting sugar less than 130 or less than 150 if tapering medicines to lose weight  - Systolic BP less than 675  - Diastolic BP less than 80 - Bad LDL Cholesterol less than 70 - Triglycerides less than 150  Targets for Glucose Readings: Time of Check Target for patients WITHOUT Diabetes Target for DIABETICS  Before Meals Less than 100  less than 150  Two hours after meals Less than 200  Less than 250

## 2014-10-05 ENCOUNTER — Ambulatory Visit: Payer: Self-pay

## 2014-10-26 ENCOUNTER — Encounter: Payer: Self-pay | Admitting: Physician Assistant

## 2014-11-02 ENCOUNTER — Encounter: Payer: Self-pay | Admitting: Gastroenterology

## 2014-12-14 ENCOUNTER — Ambulatory Visit (INDEPENDENT_AMBULATORY_CARE_PROVIDER_SITE_OTHER): Payer: Medicare HMO | Admitting: Physician Assistant

## 2014-12-14 ENCOUNTER — Encounter: Payer: Self-pay | Admitting: Physician Assistant

## 2014-12-14 VITALS — BP 110/64 | HR 68 | Temp 97.5°F | Resp 16 | Ht 62.0 in | Wt 149.2 lb

## 2014-12-14 DIAGNOSIS — Z6827 Body mass index (BMI) 27.0-27.9, adult: Secondary | ICD-10-CM

## 2014-12-14 DIAGNOSIS — E1169 Type 2 diabetes mellitus with other specified complication: Secondary | ICD-10-CM | POA: Insufficient documentation

## 2014-12-14 DIAGNOSIS — Z1331 Encounter for screening for depression: Secondary | ICD-10-CM

## 2014-12-14 DIAGNOSIS — N951 Menopausal and female climacteric states: Secondary | ICD-10-CM | POA: Diagnosis not present

## 2014-12-14 DIAGNOSIS — R232 Flushing: Secondary | ICD-10-CM

## 2014-12-14 DIAGNOSIS — E559 Vitamin D deficiency, unspecified: Secondary | ICD-10-CM

## 2014-12-14 DIAGNOSIS — E785 Hyperlipidemia, unspecified: Secondary | ICD-10-CM | POA: Insufficient documentation

## 2014-12-14 DIAGNOSIS — Z1389 Encounter for screening for other disorder: Secondary | ICD-10-CM

## 2014-12-14 DIAGNOSIS — Z79899 Other long term (current) drug therapy: Secondary | ICD-10-CM

## 2014-12-14 DIAGNOSIS — R030 Elevated blood-pressure reading, without diagnosis of hypertension: Secondary | ICD-10-CM | POA: Diagnosis not present

## 2014-12-14 DIAGNOSIS — E1129 Type 2 diabetes mellitus with other diabetic kidney complication: Secondary | ICD-10-CM | POA: Diagnosis not present

## 2014-12-14 DIAGNOSIS — Z23 Encounter for immunization: Secondary | ICD-10-CM | POA: Diagnosis not present

## 2014-12-14 DIAGNOSIS — IMO0001 Reserved for inherently not codable concepts without codable children: Secondary | ICD-10-CM

## 2014-12-14 DIAGNOSIS — M8580 Other specified disorders of bone density and structure, unspecified site: Secondary | ICD-10-CM | POA: Diagnosis not present

## 2014-12-14 DIAGNOSIS — E782 Mixed hyperlipidemia: Secondary | ICD-10-CM

## 2014-12-14 DIAGNOSIS — C50911 Malignant neoplasm of unspecified site of right female breast: Secondary | ICD-10-CM

## 2014-12-14 DIAGNOSIS — E039 Hypothyroidism, unspecified: Secondary | ICD-10-CM | POA: Diagnosis not present

## 2014-12-14 DIAGNOSIS — Z9181 History of falling: Secondary | ICD-10-CM

## 2014-12-14 DIAGNOSIS — Z789 Other specified health status: Secondary | ICD-10-CM | POA: Diagnosis not present

## 2014-12-14 LAB — CBC WITH DIFFERENTIAL/PLATELET
BASOS PCT: 1 % (ref 0–1)
Basophils Absolute: 0 10*3/uL (ref 0.0–0.1)
EOS ABS: 0.1 10*3/uL (ref 0.0–0.7)
EOS PCT: 2 % (ref 0–5)
HCT: 37.6 % (ref 36.0–46.0)
Hemoglobin: 13.1 g/dL (ref 12.0–15.0)
Lymphocytes Relative: 30 % (ref 12–46)
Lymphs Abs: 1 10*3/uL (ref 0.7–4.0)
MCH: 35.6 pg — ABNORMAL HIGH (ref 26.0–34.0)
MCHC: 34.8 g/dL (ref 30.0–36.0)
MCV: 102.2 fL — ABNORMAL HIGH (ref 78.0–100.0)
MONO ABS: 0.4 10*3/uL (ref 0.1–1.0)
MPV: 11 fL (ref 8.6–12.4)
Monocytes Relative: 13 % — ABNORMAL HIGH (ref 3–12)
NEUTROS ABS: 1.8 10*3/uL (ref 1.7–7.7)
Neutrophils Relative %: 54 % (ref 43–77)
PLATELETS: 106 10*3/uL — AB (ref 150–400)
RBC: 3.68 MIL/uL — AB (ref 3.87–5.11)
RDW: 14.2 % (ref 11.5–15.5)
WBC: 3.3 10*3/uL — AB (ref 4.0–10.5)

## 2014-12-14 MED ORDER — GLUCOSE BLOOD VI STRP: ORAL_STRIP | Status: DC

## 2014-12-14 MED ORDER — GLIPIZIDE 5 MG PO TABS: ORAL_TABLET | ORAL | Status: DC

## 2014-12-14 NOTE — Patient Instructions (Addendum)
Get colonoscopy Get DEXA  Diabetes is a very complicated disease...lets simplify it.  An easy way to look at it to understand the complications is if you think of the extra sugar floating in your blood stream as glass shards floating through your blood stream.    Diabetes affects your small vessels first: 1) The glass shards (sugar) scraps down the tiny blood vessels in your eyes and lead to diabetic retinopathy, the leading cause of blindness in the [place]. Diabetes is the leading cause of newly diagnosed adult (37 to 73 years of age) blindness in the [place].  2) The glass shards scratches down the tiny vessels of your legs leading to nerve damage called neuropathy and can lead to amputations of your feet. More than 60% of all non-traumatic amputations of lower limbs occur in people with diabetes.  3) Over time the small vessels in your brain are shredded and closed off, individually this does not cause any problems but over a long period of time many of the small vessels being blocked can lead to Vascular Dementia.   4) Your kidney's are a filter system and have a "net" that keeps certain things in the body and lets bad things out. Sugar shreds this net and leads to kidney damage and eventually failure. Decreasing the sugar that is destroying the net and certain blood pressure medications can help stop or decrease progression of kidney disease. Diabetes was the primary cause of kidney failure in 44 percent of all new cases in 2011.  5) Diabetes also destroys the small vessels in your penis that lead to erectile dysfunction. Eventually the vessels are so damaged that you may not be responsive to cialis or viagra.   Diabetes and your large vessels: Your larger vessels consist of your coronary arteries in your heart and the carotid vessels to your brain. Diabetes or even increased sugars put you at 300% increased risk of heart attack and stroke and this is why.. The sugar scrapes down your  large blood vessels and your body sees this as an internal injury and tries to repair itself. Just like you get a scab on your skin, your platelets will stick to the blood vessel wall trying to heal it. This is why we have diabetics on low dose aspirin daily, this prevents the platelets from sticking and can prevent plaque formation. In addition, your body takes cholesterol and tries to shove it into the open wound. This is why we want your LDL, or bad cholesterol, below 70.   The combination of platelets and cholesterol over 5-10 years forms plaque that can break off and cause a heart attack or stroke.   PLEASE REMEMBER:  Diabetes is preventable! Up to 82 percent of complications and morbidities among individuals with type 2 diabetes can be prevented, delayed, or effectively treated and minimized with regular visits to a health professional, appropriate monitoring and medication, and a healthy diet and lifestyle.     Bad carbs also include fruit juice, alcohol, and sweet tea. These are empty calories that do not signal to your brain that you are full.   Please remember the good carbs are still carbs which convert into sugar. So please measure them out no more than 1/2-1 cup of rice, oatmeal, pasta, and beans  Veggies are however free foods! Pile them on.   Not all fruit is created equal. Please see the list below, the fruit at the bottom is higher in sugars than the fruit at the top.  Please avoid all dried fruits.     Varicose Veins Varicose veins are veins that have become enlarged and twisted. CAUSES This condition is the result of valves in the veins not working properly. Valves in the veins help return blood from the leg to the heart. When your calf muscles squeeze, the blood moves up your leg then the valves close and this continues until the blood gets back to your heart.  If these valves are damaged, blood flows backwards and backs up into the veins in the leg near the skin OR if your  are sitting/standing for a long time without using your calf muscles the blood will back up into the veins in your legs. This causes the veins to become larger. People who are on their feet a lot, sit a lot without walking (like on a plane, at a desk, or in a car), who are pregnant, or who are overweight are more likely to develop varicose veins. SYMPTOMS  5. Bulging, twisted-appearing, bluish veins, most commonly found on the legs. 6. Leg pain or a feeling of heaviness. These symptoms may be worse at the end of the day. 7. Leg swelling. 8. Skin color changes. DIAGNOSIS  Varicose veins can usually be diagnosed with an exam of your legs by your caregiver. He or she may recommend an ultrasound of your leg veins. TREATMENT  Most varicose veins can be treated at home.However, other treatments are available for people who have persistent symptoms or who want to treat the cosmetic appearance of the varicose veins. But this is only cosmetic and they will return if not properly treated. These include:  Laser treatment of very small varicose veins.  Medicine that is shot (injected) into the vein. This medicine hardens the walls of the vein and closes off the vein. This treatment is called sclerotherapy. Afterwards, you may need to wear clothing or bandages that apply pressure.  Surgery. HOME CARE INSTRUCTIONS   Do not stand or sit in one position for long periods of time. Do not sit with your legs crossed. Rest with your legs raised during the day.  Your legs have to be higher than your heart so that gravity will force the valves to open, so please really elevate your legs.   Wear elastic stockings or support hose. Do not wear other tight, encircling garments around the legs, pelvis, or waist.  [employer]  has a wide variety of well priced compression stockings. [place], [place] [id number] [address] [place] ARE COPPER INFUSED COMPRESSION SOCKS AT [facility] OR [facility]  Walk as  much as possible to increase blood flow.  Raise the foot of your bed at night with 2-inch blocks.  If you get a cut in the skin over the vein and the vein bleeds, lie down with your leg raised and press on it with a clean cloth until the bleeding stops. Then place a bandage (dressing) on the cut. See your caregiver if it continues to bleed or needs stitches. SEEK MEDICAL CARE IF:   The skin around your ankle starts to break down.  You have pain, redness, tenderness, or hard swelling developing in your leg over a vein.  You are uncomfortable due to leg pain. Document Released: 12/19/2004 Document Revised: 06/03/2011 Document Reviewed: 05/07/2010 [facility] Patient Information 2014 [place].   Can call Dr. Toy Cookey # [phone] Ask about payments  Or [place] [facility]

## 2014-12-14 NOTE — Progress Notes (Signed)
Complete Physical  Assessment and Plan: 1. Type 2 diabetes mellitus with other diabetic kidney complication Discussed general issues about diabetes pathophysiology and management., Educational material distributed., Suggested low cholesterol diet., Encouraged aerobic exercise., Discussed foot care., Reminded to get yearly retinal exam. - Hemoglobin A1c - Insulin, fasting - EKG 12-Lead - HM DIABETES FOOT EXAM - glucose blood (ACCU-CHEK AVIVA) test strip; Use as instructed  Dispense: 100 each; Refill: 12 - glipiZIDE (GLUCOTROL) 5 MG tablet; TAKE ONE TABLET BY MOUTH THREE TIMES DAILY BEFORE MEAL(S)  Dispense: 270 tablet; Refill: 3  2. Elevated BP - CBC with Differential/Platelet - Hepatic function panel - BASIC METABOLIC PANEL WITH GFR - Urinalysis, Routine w reflex microscopic (not at [facility]) - Microalbumin / creatinine urine ratio - EKG 12-Lead  3. Hypothyroidism, unspecified hypothyroidism type Hypothyroidism-check TSH level, continue medications the same, reminded to take on an empty stomach 30-41mns before food.  - TSH  4. Hyperlipemia -continue medications, check lipids, decrease fatty foods, increase activity.  - Lipid panel  5. Vitamin D deficiency - Vit D  25 hydroxy (rtn osteoporosis monitoring)  6. Medication management - Magnesium  7. Osteopenia Get DEXA  8. Hot flashes better  9. Breast cancer, right Continue getting MGM  10. Need for prophylactic vaccination and inoculation against influenza - Flu vaccine HIGH DOSE PF  11. Health Maintenance Get colonoscopy, up to date on other shots.   Discussed med's effects and SE's. Screening labs and tests as requested with regular follow-up as recommended. Over 40 minutes of exam, counseling, chart review, and complex, high level critical decision making was performed this visit.   HPI  74y.o. female  presents for a complete physical.  Her blood pressure has been controlled at home, today their BP is BP: 110/64  mmHg She does not workout but takes care of her grand kids. She denies chest pain, shortness of breath, dizziness.  She is not on cholesterol medication, declines statins. Her cholesterol is not at goal. The cholesterol last visit was:   Lab Results  Component Value Date   CHOL 179 08/30/2014   HDL 62 08/30/2014   LDLCALC 101* 08/30/2014   TRIG 81 08/30/2014   CHOLHDL 2.9 08/30/2014   She has been working on diet and exercise for diabetes, she is on bASA, she is not on ACE/ARB due to hypotension, sugars have been 120's-130's, she is on janumet and glipizide 5 mg 2 in the AM and 1 PM, she is on accucheck and denies hypoglycemia , paresthesia of the feet, polydipsia, polyuria and visual disturbances. Last A1C in the office was:  Lab Results  Component Value Date   HGBA1C 7.9* 08/30/2014   Lab Results  Component Value Date   GFRNONAA 89 08/30/2014   Patient is on Vitamin D supplement.   Lab Results  Component Value Date   VD25OH 57506/09/2014     She is on thyroid medication. Her medication was changed last visit, M,w,F 1/2 pill and 1 pill the other days.   Lab Results  Component Value Date   TSH <0.008* 08/30/2014  .  She has history of osteoporosis, stopped fosamax last year, due for DEXA.  History of right breast cancer.   Current Medications:  Current Outpatient Prescriptions on File Prior to Visit  Medication Sig Dispense Refill  . Blood Glucose Monitoring Suppl (ACCU-CHEK AVIVA PLUS) W/DEVICE KIT check blood glucose 1 time daily-DX-E11.29 1 kit 0  . Cholecalciferol (VITAMIN D) 2000 UNITS tablet Take 4,000 Units by mouth 2 (  two) times daily.    . Flaxseed, Linseed, 1300 MG CAPS Take 1 capsule by mouth 2 (two) times daily.    Marland Kitchen glipiZIDE (GLUCOTROL) 5 MG tablet TAKE ONE TABLET BY MOUTH THREE TIMES DAILY BEFORE MEAL(S) 270 tablet 3  . glucose blood (ACCU-CHEK AVIVA) test strip Use as instructed 100 each 12  . [START ON 02/21/2015] Lancet Devices (ACCU-CHEK SOFTCLIX) lancets  Use as instructed 1 each 0  . Lancets (ACCU-CHEK SOFT TOUCH) lancets Check blood glucose 1 time daily-DX-E11.29 100 each 12  . levothyroxine (SYNTHROID, LEVOTHROID) 150 MCG tablet Take 1 to 1 1/2 tabs daily as directed. 135 tablet 1  . Omega-3 Fatty Acids (FISH OIL) 1200 MG CAPS Take 1 capsule by mouth 2 (two) times daily.    . sitaGLIPtin-metformin (JANUMET) 50-1000 MG per tablet Take 1 tablet by mouth 2 (two) times daily with a meal. 28 tablet 0   No current facility-administered medications on file prior to visit.   Health Maintenance:   Immunization History  Administered Date(s) Administered  . Influenza Split 03/06/2011, 01/06/2013  . Influenza, High Dose Seasonal PF 01/12/2014  . Pneumococcal Conjugate-13 01/12/2014  . Pneumococcal Polysaccharide-23 08/30/2014  . Tdap 06/10/2012   Tetanus: 2014 Pneumovax:2016 Prevnar 13: 2015 Flu vaccine:2015 DUE Zostavax: declines due to cost NMM:[id number] MGM: 08/2014 DEXA: 2014 Colonoscopy: 2006, due this year, has appointment with GI doctor and will get EGD Thyroid scan 2011 AB [place] 2013  Last Dental Exam: Dr. Nolon Lennert Last Eye Exam: Dr. Tyna Jaksch Patient Care Team: Unk Pinto, MD as PCP - General (Internal Medicine) Carol Ada, MD as Consulting Physician (Gastroenterology) Inda Castle, MD as Consulting Physician (Gastroenterology)  Allergies:  Allergies  Allergen Reactions  . Metformin And Related   . Welchol [Colesevelam Hcl]    Medical History:  Past Medical History  Diagnosis Date  . Breast cancer 05/2006    Right breast cancer  . Thyroid disease   . Diabetes mellitus without complication   . Cirrhosis   . Osteopenia   . Fibroids    Surgical History:  Past Surgical History  Procedure Laterality Date  . Breast lumpectomy with axillary lymph node biopsy Right 06/25/2006  . Cholecystectomy    . Abdominal hysterectomy      TAH BSO  . Tubal ligation  1978   Family History:  Family History  Problem Relation Age  of Onset  . Cancer Mother     Brain cancer  . Cancer Father     Prostate cancer  . Diabetes Sister   . Hypertension Sister   . Liver disease Brother   . Diabetes Maternal Aunt   . Heart Problems Maternal Grandfather   . Cancer Paternal Grandfather     Unknown cancer  . Diabetes Sister    Social History:  Social History  Substance Use Topics  . Smoking status: Never Smoker   . Smokeless tobacco: Never Used  . Alcohol Use: No   Review of Systems: Review of Systems  Constitutional: Negative.   HENT: Negative.   Eyes: Negative.   Respiratory: Negative.   Cardiovascular: Negative.   Gastrointestinal: Negative.   Genitourinary: Negative.   Musculoskeletal: Negative.   Skin: Negative.   Neurological: Negative.   Psychiatric/Behavioral: Negative.     Physical Exam: Estimated body mass index is 27.28 kg/(m^2) as calculated from the following:   Height as of this encounter: 5' 2" (1.575 m).   Weight as of this encounter: 149 lb 3.2 oz (67.677 kg). BP 110/64 mmHg  Pulse  68  Temp(Src) 97.5 F (36.4 C)  Resp 16  Ht 5' 2" (1.575 m)  Wt 149 lb 3.2 oz (67.677 kg)  BMI 27.28 kg/m2 General Appearance: Well nourished, in no apparent distress.  Eyes: PERRLA, EOMs, conjunctiva no swelling or erythema, normal fundi and vessels.  Sinuses: No Frontal/maxillary tenderness  ENT/Mouth: Ext aud canals clear, normal light reflex with TMs without erythema, bulging. Good dentition. No erythema, swelling, or exudate on post pharynx. Tonsils not swollen or erythematous. Hearing normal.  Neck: Supple, thyroid normal. No bruits  Respiratory: Respiratory effort normal, BS equal bilaterally without rales, rhonchi, wheezing or stridor.  Cardio: RRR with 2/6 RSB crescendo/decrescendo systolic murmur, rubs or gallops. Brisk peripheral pulses without edema.  Chest: symmetric, with normal excursions and percussion.  Breasts: Symmetric, without lumps, nipple discharge, retractions. Well healed scar  lateral right breast.   Abdomen: Soft, nontender, no guarding, rebound, hernias, masses, or organomegaly.  Lymphatics: Non tender without lymphadenopathy.  Genitourinary: defer Musculoskeletal: Full ROM all peripheral extremities,5/5 strength, and normal gait.  Skin:  varicose veins bilateral legs without erythema, warmth, Warm, dry without rashes, lesions, ecchymosis. Neuro: Cranial nerves intact, reflexes equal bilaterally. Normal muscle tone, no cerebellar symptoms. Sensation intact.  Psych: Awake and oriented X 3, normal affect, Insight and Judgment appropriate.   EKG: CBBB no ST changes. AORTA SCAN: defer  Vicie Mutters 1:56 PM [facility]

## 2014-12-15 LAB — URINALYSIS, ROUTINE W REFLEX MICROSCOPIC
Bilirubin Urine: NEGATIVE
Hgb urine dipstick: NEGATIVE
Ketones, ur: NEGATIVE
Leukocytes, UA: NEGATIVE
NITRITE: NEGATIVE
Protein, ur: NEGATIVE
SPECIFIC GRAVITY, URINE: 1.027 (ref 1.001–1.035)
pH: 5 (ref 5.0–8.0)

## 2014-12-15 LAB — HEMOGLOBIN A1C
Hgb A1c MFr Bld: 8 % — ABNORMAL HIGH (ref <5.7)
Mean Plasma Glucose: 183 mg/dL — ABNORMAL HIGH (ref <117)

## 2014-12-15 LAB — BASIC METABOLIC PANEL WITH GFR
BUN: 12 mg/dL (ref 7–25)
CALCIUM: 8.9 mg/dL (ref 8.6–10.4)
CHLORIDE: 103 mmol/L (ref 98–110)
CO2: 24 mmol/L (ref 20–31)
Creat: 0.61 mg/dL (ref 0.60–0.93)
GFR, Est African American: >89 mL/min (ref >=60)
Glucose, Bld: 155 mg/dL — ABNORMAL HIGH (ref 65–99)
Potassium: 4.1 mmol/L (ref 3.5–5.3)
SODIUM: 138 mmol/L (ref 135–146)

## 2014-12-15 LAB — HEPATIC FUNCTION PANEL
ALT: 33 U/L — ABNORMAL HIGH (ref 6–29)
AST: 37 U/L — ABNORMAL HIGH (ref 10–35)
Albumin: 3.6 g/dL (ref 3.6–5.1)
Alkaline Phosphatase: 94 U/L (ref 33–130)
BILIRUBIN TOTAL: 0.9 mg/dL (ref 0.2–1.2)
Bilirubin, Direct: 0.2 mg/dL (ref <=0.2)
Indirect Bilirubin: 0.7 mg/dL (ref 0.2–1.2)
Total Protein: 6.3 g/dL (ref 6.1–8.1)

## 2014-12-15 LAB — VITAMIN D 25 HYDROXY (VIT D DEFICIENCY, FRACTURES): VIT D 25 HYDROXY: 57 ng/mL (ref 30–100)

## 2014-12-15 LAB — INSULIN, FASTING: INSULIN FASTING, SERUM: 25.5 u[IU]/mL — AB (ref 2.0–19.6)

## 2014-12-15 LAB — TSH: TSH: 0.056 u[IU]/mL — AB (ref 0.350–4.500)

## 2014-12-15 LAB — LIPID PANEL
CHOL/HDL RATIO: 2.8 ratio (ref <=5.0)
CHOLESTEROL: 172 mg/dL (ref 125–200)
HDL: 62 mg/dL (ref >=46)
LDL Cholesterol: 88 mg/dL (ref <130)
TRIGLYCERIDES: 112 mg/dL (ref <150)
VLDL: 22 mg/dL (ref <30)

## 2014-12-15 LAB — MAGNESIUM: Magnesium: 1.7 mg/dL (ref 1.5–2.5)

## 2014-12-15 LAB — MICROALBUMIN / CREATININE URINE RATIO
CREATININE, URINE: 166.3 mg/dL
MICROALB UR: 0.6 mg/dL (ref <2.0)
Microalb Creat Ratio: 3.6 mg/g (ref 0.0–30.0)

## 2014-12-19 ENCOUNTER — Other Ambulatory Visit: Payer: Self-pay | Admitting: Internal Medicine

## 2015-01-25 ENCOUNTER — Other Ambulatory Visit: Payer: Medicare HMO

## 2015-02-01 ENCOUNTER — Ambulatory Visit
Admission: RE | Admit: 2015-02-01 | Discharge: 2015-02-01 | Disposition: A | Discharge status: Home / Self Care | Payer: Medicare HMO | Source: Ambulatory Visit | Attending: Physician Assistant | Admitting: Physician Assistant

## 2015-02-01 DIAGNOSIS — M8580 Other specified disorders of bone density and structure, unspecified site: Secondary | ICD-10-CM

## 2015-02-21 ENCOUNTER — Other Ambulatory Visit: Payer: Self-pay | Admitting: Internal Medicine

## 2015-03-22 ENCOUNTER — Other Ambulatory Visit: Payer: Self-pay | Admitting: Physician Assistant

## 2015-03-22 ENCOUNTER — Other Ambulatory Visit: Payer: Self-pay

## 2015-03-22 DIAGNOSIS — E1129 Type 2 diabetes mellitus with other diabetic kidney complication: Secondary | ICD-10-CM

## 2015-03-22 MED ORDER — GLIPIZIDE 5 MG PO TABS: ORAL_TABLET | ORAL | Status: DC

## 2015-04-12 ENCOUNTER — Encounter: Payer: Self-pay | Admitting: Internal Medicine

## 2015-04-12 ENCOUNTER — Ambulatory Visit: Payer: Self-pay | Admitting: Internal Medicine

## 2015-04-12 ENCOUNTER — Ambulatory Visit (INDEPENDENT_AMBULATORY_CARE_PROVIDER_SITE_OTHER): Payer: Medicare HMO | Admitting: Physician Assistant

## 2015-04-12 VITALS — BP 120/72 | HR 64 | Temp 97.3°F | Resp 16 | Ht 62.0 in | Wt 148.6 lb

## 2015-04-12 DIAGNOSIS — IMO0001 Reserved for inherently not codable concepts without codable children: Secondary | ICD-10-CM

## 2015-04-12 DIAGNOSIS — Z79899 Other long term (current) drug therapy: Secondary | ICD-10-CM

## 2015-04-12 DIAGNOSIS — Z1389 Encounter for screening for other disorder: Secondary | ICD-10-CM

## 2015-04-12 DIAGNOSIS — E785 Hyperlipidemia, unspecified: Secondary | ICD-10-CM | POA: Diagnosis not present

## 2015-04-12 DIAGNOSIS — E1129 Type 2 diabetes mellitus with other diabetic kidney complication: Secondary | ICD-10-CM | POA: Diagnosis not present

## 2015-04-12 DIAGNOSIS — E039 Hypothyroidism, unspecified: Secondary | ICD-10-CM

## 2015-04-12 DIAGNOSIS — Z1331 Encounter for screening for depression: Secondary | ICD-10-CM

## 2015-04-12 DIAGNOSIS — R413 Other amnesia: Secondary | ICD-10-CM

## 2015-04-12 DIAGNOSIS — R030 Elevated blood-pressure reading, without diagnosis of hypertension: Secondary | ICD-10-CM | POA: Diagnosis not present

## 2015-04-12 DIAGNOSIS — E559 Vitamin D deficiency, unspecified: Secondary | ICD-10-CM | POA: Diagnosis not present

## 2015-04-12 LAB — BASIC METABOLIC PANEL WITH GFR
BUN: 12 mg/dL (ref 7–25)
CALCIUM: 9.1 mg/dL (ref 8.6–10.4)
CO2: 24 mmol/L (ref 20–31)
CREATININE: 0.75 mg/dL (ref 0.60–0.93)
Chloride: 105 mmol/L (ref 98–110)
GFR, EST NON AFRICAN AMERICAN: 79 mL/min (ref >=60)
GLUCOSE: 188 mg/dL — AB (ref 65–99)
Potassium: 4.2 mmol/L (ref 3.5–5.3)
Sodium: 137 mmol/L (ref 135–146)

## 2015-04-12 LAB — LIPID PANEL
CHOL/HDL RATIO: 2.9 ratio (ref <=5.0)
Cholesterol: 214 mg/dL — ABNORMAL HIGH (ref 125–200)
HDL: 74 mg/dL (ref >=46)
LDL CALC: 120 mg/dL (ref <130)
Triglycerides: 100 mg/dL (ref <150)
VLDL: 20 mg/dL (ref <30)

## 2015-04-12 LAB — CBC WITH DIFFERENTIAL/PLATELET
BASOS PCT: 1 % (ref 0–1)
Basophils Absolute: 0 10*3/uL (ref 0.0–0.1)
EOS PCT: 2 % (ref 0–5)
Eosinophils Absolute: 0.1 10*3/uL (ref 0.0–0.7)
HCT: 40.8 % (ref 36.0–46.0)
Hemoglobin: 13.7 g/dL (ref 12.0–15.0)
Lymphocytes Relative: 29 % (ref 12–46)
Lymphs Abs: 1 10*3/uL (ref 0.7–4.0)
MCH: 35.7 pg — AB (ref 26.0–34.0)
MCHC: 33.6 g/dL (ref 30.0–36.0)
MCV: 106.3 fL — ABNORMAL HIGH (ref 78.0–100.0)
MONO ABS: 0.3 10*3/uL (ref 0.1–1.0)
MONOS PCT: 9 % (ref 3–12)
MPV: 11.1 fL (ref 8.6–12.4)
Neutro Abs: 1.9 10*3/uL (ref 1.7–7.7)
Neutrophils Relative %: 59 % (ref 43–77)
Platelets: 107 10*3/uL — ABNORMAL LOW (ref 150–400)
RBC: 3.84 MIL/uL — ABNORMAL LOW (ref 3.87–5.11)
RDW: 14 % (ref 11.5–15.5)
WBC: 3.3 10*3/uL — AB (ref 4.0–10.5)

## 2015-04-12 LAB — HEPATIC FUNCTION PANEL
ALT: 41 U/L — AB (ref 6–29)
AST: 48 U/L — ABNORMAL HIGH (ref 10–35)
Albumin: 3.5 g/dL — ABNORMAL LOW (ref 3.6–5.1)
Alkaline Phosphatase: 95 U/L (ref 33–130)
BILIRUBIN DIRECT: 0.3 mg/dL — AB (ref <=0.2)
Indirect Bilirubin: 1.1 mg/dL (ref 0.2–1.2)
Total Bilirubin: 1.4 mg/dL — ABNORMAL HIGH (ref 0.2–1.2)
Total Protein: 6.5 g/dL (ref 6.1–8.1)

## 2015-04-12 LAB — TSH: TSH: 9.868 u[IU]/mL — ABNORMAL HIGH (ref 0.350–4.500)

## 2015-04-12 LAB — MAGNESIUM: Magnesium: 1.6 mg/dL (ref 1.5–2.5)

## 2015-04-12 NOTE — Progress Notes (Signed)
Assessment and Plan:  1. Hypertension -Continue medication, monitor blood pressure at home. Continue DASH diet.  Reminder to go to the ER if any CP, SOB, nausea, dizziness, severe HA, changes vision/speech, left arm numbness and tingling and jaw pain.  2. Cholesterol -Continue diet and exercise. Check cholesterol.   3. Diabetes without complications -Continue diet and exercise. Check A1C  4. Vitamin D Def - check level and continue medications.   5. Hypothyroidism -check TSH level, continue medications the same, reminded to take on an empty stomach 30-35mns before food.   6. Short term memory Had good recall, normal neuro, denies depression/anxiety Will monitor, given information about recall, if any new symptoms let [place] Control blood pressure, cholesterol, glucose, increase exercise.   7. Scaly skin on neck- likely actinic keratosis Will freeze next ov, going to funeral, declines today  Continue diet and meds as discussed. Further disposition pending results of labs. Discussed med's effects and SE's.    Over 30 minutes of exam, counseling, chart review, and critical decision making was performed   HPI 75y.o. female  presents for 3 month follow up on hypertension, cholesterol, diabetes and vitamin D deficiency.   She complains of non healing red spot, non  puritic x 3-5 months.   Her blood pressure has been controlled at home, today her BP is BP: 120/72 mmHg (left arm-164/84 and right arm-166/76).  She does not workout. She denies chest pain, shortness of breath, dizziness.  She is not on cholesterol medication and denies myalgias. Her cholesterol is at goal. The cholesterol was:   Lab Results  Component Value Date   CHOL 172 12/14/2014   HDL 62 12/14/2014   LDLCALC 88 12/14/2014   TRIG 112 12/14/2014   CHOLHDL 2.8 12/14/2014    She has been working on diet and exercise for diabetes without complications, she is on bASA, she is not on ACE/ARB, and denies  paresthesia  of the feet, polydipsia, polyuria and visual disturbances. Last A1C was:  Lab Results  Component Value Date   HGBA1C 8.0* 12/14/2014    Patient is on Vitamin D supplement. Lab Results  Component Value Date   VD25OH 5209/21/2016   She is on thyroid medication. Her medication was changed last visit, she was suppose to be on 1 a day but she has been on 1/2 every other day with 1 pill the other days, she has some confusion. .   Lab Results  Component Value Date   TSH 0.056* 12/14/2014   Declines depression and anxiety, has some short term memory, got 2/3 on recall.   BMI is Body mass index is 27.17 kg/(m^2)., she is working on diet and exercise. Wt Readings from Last 3 Encounters:  04/12/15 148 lb 9.6 oz (67.405 kg)  12/14/14 149 lb 3.2 oz (67.677 kg)  09/21/14 151 lb (68.493 kg)    Current Medications:  Current Outpatient Prescriptions on File Prior to Visit  Medication Sig Dispense Refill  . ACCU-CHEK SOFTCLIX LANCETS lancets USE  AS DIRECTED ONCE DAILY 100 each 99  . Blood Glucose Monitoring Suppl (ACCU-CHEK AVIVA PLUS) W/DEVICE KIT check blood glucose 1 time daily-DX-E11.29 1 kit 0  . Cholecalciferol (VITAMIN D) 2000 UNITS tablet Take 4,000 Units by mouth 2 (two) times daily.    . Flaxseed, Linseed, 1300 MG CAPS Take 1 capsule by mouth 2 (two) times daily.    .Marland KitchenglipiZIDE (GLUCOTROL) 5 MG tablet TAKE ONE TABLET BY MOUTH THREE TIMES DAILY BEFORE MEALS. 270 tablet  0  . glucose blood (ACCU-CHEK AVIVA) test strip Use as instructed 100 each 12  . Lancets (ACCU-CHEK SOFT TOUCH) lancets Check blood glucose 1 time daily-DX-E11.29 100 each 12  . levothyroxine (SYNTHROID, LEVOTHROID) 150 MCG tablet Take 1 to 1 1/2 tabs daily as directed. 135 tablet 1  . Omega-3 Fatty Acids (FISH OIL) 1200 MG CAPS Take 1 capsule by mouth 2 (two) times daily.    . sitaGLIPtin-metformin (JANUMET) 50-1000 MG per tablet Take 1 tablet by mouth 2 (two) times daily with a meal. 28 tablet 0   No current  facility-administered medications on file prior to visit.   Medical History:  Past Medical History  Diagnosis Date  . Breast cancer ([place]) 05/2006    Right breast cancer  . Thyroid disease   . Diabetes mellitus without complication ([place])   . Cirrhosis ([place])   . Osteopenia   . Fibroids    Allergies:  Allergies  Allergen Reactions  . Metformin And Related   . Welchol [Colesevelam Hcl]      Review of Systems:  Review of Systems  Constitutional: Negative.   HENT: Negative.   Eyes: Negative.   Respiratory: Negative.   Cardiovascular: Negative.   Gastrointestinal: Negative.   Genitourinary: Negative.   Musculoskeletal: Negative.   Skin: Positive for rash. Negative for itching.  Neurological: Negative.   Psychiatric/Behavioral: Positive for memory loss. Negative for depression, suicidal ideas, hallucinations and substance abuse. The patient is not nervous/anxious and does not have insomnia.     Family history- Review and unchanged Social history- Review and unchanged Physical Exam: BP 120/72 mmHg  Pulse 64  Temp(Src) 97.3 F (36.3 C)  Resp 16  Ht _0  (1.575 m)  Wt 148 lb 9.6 oz (67.405 kg)  BMI 27.17 kg/m2 Wt Readings from Last 3 Encounters:  04/12/15 148 lb 9.6 oz (67.405 kg)  12/14/14 149 lb 3.2 oz (67.677 kg)  09/21/14 151 lb (68.493 kg)   General Appearance: Well nourished, in no apparent distress. Eyes: PERRLA, EOMs, conjunctiva no swelling or erythema Sinuses: No Frontal/maxillary tenderness ENT/Mouth: Ext aud canals clear, TMs without erythema, bulging. No erythema, swelling, or exudate on post pharynx.  Tonsils not swollen or erythematous. Hearing normal.  Neck: Supple, thyroid normal.  Respiratory: Respiratory effort normal, BS equal bilaterally without rales, rhonchi, wheezing or stridor.  Cardio: RRR with no MRGs. Brisk peripheral pulses without edema.  Abdomen: Soft, + BS.  Non tender, no guarding, rebound, hernias, masses. Lymphatics: Non tender  without lymphadenopathy.  Musculoskeletal: Full ROM, 5/5 strength, Normal gait Skin: left neck with 2 scaly erythematous patches, Warm, dry without rashes, lesions, ecchymosis.  Neuro: Cranial nerves intact. No cerebellar symptoms.  Psych: Awake and oriented X 3, normal affect, 2/3 short term recall, some trouble word finding, Insight and Judgment appropriate.    Vicie Mutters, PA-C 12:04 PM [facility]

## 2015-04-12 NOTE — Patient Instructions (Signed)
Management of Memory Problems  There are some general things you can do to help manage your memory problems.  Your memory may not in fact recover, but by using techniques and strategies you will be able to manage your memory difficulties better.  1)  Establish a routine.  Try to establish and then stick to a regular routine.  By doing this, you will get used to what to expect and you will reduce the need to rely on your memory.  Also, try to do things at the same time of day, such as taking your medication or checking your calendar first thing in the morning.  Think about think that you can do as a part of a regular routine and make a list.  Then enter them into a daily planner to remind you.  This will help you establish a routine.  2)  Organize your environment.  Organize your environment so that it is uncluttered.  Decrease visual stimulation.  Place everyday items such as keys or cell phone in the same place every day (ie.  Basket next to front door)  Use post it notes with a brief message to yourself (ie. Turn off light, lock the door)  Use labels to indicate where things go (ie. Which cupboards are for food, dishes, etc.)  Keep a notepad and pen by the telephone to take messages  3)  Memory Aids  A diary or journal/notebook/daily planner  Making a list (shopping list, chore list, to do list that needs to be done)  Using an alarm as a reminder (kitchen timer or cell phone alarm)  Using cell phone to store information (Notes, Calendar, Reminders)  Calendar/White board placed in a prominent position  Post-it notes  In order for memory aids to be useful, you need to have good habits.  It's no good remembering to make a note in your journal if you don't remember to look in it.  Try setting aside a certain time of day to look in journal.  4)  Improving mood and managing fatigue.  There may be other factors that contribute to memory difficulties.  Factors, such as anxiety,  depression and tiredness can affect memory.  Regular gentle exercise can help improve your mood and give you more energy.  Simple relaxation techniques may help relieve symptoms of anxiety  Try to get back to completing activities or hobbies you enjoyed doing in the past.  Learn to pace yourself through activities to decrease fatigue.  Find out about some local support groups where you can share experiences with others.  Try and achieve 7-8 hours of sleep at night.  Vascular Dementia Vascular dementia is a common cause of dementia in the elderly. Dementia is a condition that affects the brain and causes people to not think well or act normally. Vascular dementia is one type of dementia. It occurs when blood clots block small blood vessels in the brain and destroy brain tissue. Likely risk factors are high blood pressure and advanced age. This disease can cause stroke, migraine-like headaches, and psychiatric disturbances.  SYMPTOMS   Confusion.  Problems with recent memory.  Wandering or getting lost in familiar places.  Loss of bladder or bowel control (incontinence).  Unsteady gait.  Poor attention and concentration.  Emotional problems such as laughing or crying inappropriately.  Difficulty following instructions.  Problems handling money.  Depression.  Difficulty planning ahead. Usually the damage is slight at first. Over time, as more small vessels are blocked, there is a  gradual mental decline. However, symptoms may begin suddenly. Symptoms may be very similar to Alzheimer's disease. The two forms of dementia may occur together. Vascular dementia typically begins between the ages of 97 and 43. It affects men more often than women. TREATMENT   Currently there is no treatment for vascular dementia that can reverse the damage that has already occurred.  Treatment focuses on prevention of additional brain damage and improvement of symptoms.  It is important to treat  the risk factors for vascular dementia, such as keeping blood pressure under control, treating diabetes, lowering cholesterol, and stop smoking. PROGNOSIS   Prognosis for patients is generally poor. Individuals with the disease may improve for short periods of time, then get worse again. Early treatment and management of blood pressure and other risk factors may prevent further worsening of the disorder.   This information is not intended to replace advice given to you by your health care provider. Make sure you discuss any questions you have with your health care provider.   Document Released: 03/01/2002 Document Revised: 06/03/2011 Document Reviewed: 06/22/2014 Elsevier Interactive Patient Education [employer].

## 2015-04-13 LAB — HEMOGLOBIN A1C
HEMOGLOBIN A1C: 8.8 % — AB (ref <5.7)
Mean Plasma Glucose: 206 mg/dL — ABNORMAL HIGH (ref <117)

## 2015-04-13 LAB — VITAMIN D 25 HYDROXY (VIT D DEFICIENCY, FRACTURES): Vit D, 25-Hydroxy: 40 ng/mL (ref 30–100)

## 2015-06-29 ENCOUNTER — Other Ambulatory Visit: Payer: Self-pay | Admitting: Internal Medicine

## 2015-07-12 ENCOUNTER — Ambulatory Visit: Payer: Self-pay | Admitting: Physician Assistant

## 2015-07-26 ENCOUNTER — Ambulatory Visit (INDEPENDENT_AMBULATORY_CARE_PROVIDER_SITE_OTHER): Payer: Medicare HMO | Admitting: Internal Medicine

## 2015-07-26 ENCOUNTER — Encounter: Payer: Self-pay | Admitting: Internal Medicine

## 2015-07-26 VITALS — BP 126/68 | HR 72 | Temp 97.5°F | Resp 16 | Ht 62.0 in | Wt 150.4 lb

## 2015-07-26 DIAGNOSIS — E785 Hyperlipidemia, unspecified: Secondary | ICD-10-CM

## 2015-07-26 DIAGNOSIS — R030 Elevated blood-pressure reading, without diagnosis of hypertension: Secondary | ICD-10-CM

## 2015-07-26 DIAGNOSIS — E1122 Type 2 diabetes mellitus with diabetic chronic kidney disease: Secondary | ICD-10-CM | POA: Diagnosis not present

## 2015-07-26 DIAGNOSIS — E559 Vitamin D deficiency, unspecified: Secondary | ICD-10-CM

## 2015-07-26 DIAGNOSIS — IMO0001 Reserved for inherently not codable concepts without codable children: Secondary | ICD-10-CM

## 2015-07-26 DIAGNOSIS — N182 Chronic kidney disease, stage 2 (mild): Secondary | ICD-10-CM

## 2015-07-26 DIAGNOSIS — Z79899 Other long term (current) drug therapy: Secondary | ICD-10-CM

## 2015-07-26 LAB — CBC WITH DIFFERENTIAL/PLATELET
BASOS ABS: 0 {cells}/uL (ref 0–200)
Basophils Relative: 0 %
EOS PCT: 2 %
Eosinophils Absolute: 70 cells/uL (ref 15–500)
HCT: 36.7 % (ref 35.0–45.0)
Hemoglobin: 12.4 g/dL (ref 11.7–15.5)
LYMPHS PCT: 33 %
Lymphs Abs: 1155 cells/uL (ref 850–3900)
MCH: 35.2 pg — ABNORMAL HIGH (ref 27.0–33.0)
MCHC: 33.8 g/dL (ref 32.0–36.0)
MCV: 104.3 fL — ABNORMAL HIGH (ref 80.0–100.0)
MONOS PCT: 9 %
MPV: 10.7 fL (ref 7.5–12.5)
Monocytes Absolute: 315 cells/uL (ref 200–950)
NEUTROS PCT: 56 %
Neutro Abs: 1960 cells/uL (ref 1500–7800)
Platelets: 99 10*3/uL — ABNORMAL LOW (ref 140–400)
RBC: 3.52 MIL/uL — AB (ref 3.80–5.10)
RDW: 14.3 % (ref 11.0–15.0)
WBC: 3.5 10*3/uL — AB (ref 3.8–10.8)

## 2015-07-26 LAB — TSH: TSH: 32.96 m[IU]/L — AB

## 2015-07-26 NOTE — Patient Instructions (Signed)

## 2015-07-26 NOTE — Progress Notes (Signed)
Patient ID: Beverly Davis, female   DOB: [birth year], 75 y.o.   MRN: [id number]   [facility]                    Unk Pinto, M.D.    Uvaldo Bristle. Silverio Lay, P.A.-C      Starlyn Skeans, P.A.-C   [facility]                [address] [place] [address] Telephone [phone] Telefax [phone] ____________________________________________________________________________________________________________________   This very nice 75 y.o. MWF presents for  follow up with hx/o Labile Hypertension, Hyperlipidemia, T2_DM and Vitamin D Deficiency.    Patient is monitored expectantly for Labile HTN & BP has been controlled and today's BP: 126/68 mmHg. Patient has had no complaints of any cardiac type chest pain, palpitations, dyspnea/orthopnea/PND, dizziness, claudication, or dependent edema.   Hyperlipidemia is not controlled with diet & supplements. Patient denies myalgias or other med SE's. Last Lipids were 04/12/2015: Cholesterol 214*; HDL 74; LDL Cholesterol 120; Triglycerides 100 on    Also, the patient has history of T2_NIDDM circa 2007 and has had no symptoms of reactive hypoglycemia, diabetic polys, paresthesias or visual blurring. Patient does admit no Dietary compliance Last A1c was not at goal with A1c 8.8% on 04/12/2015. Patient has difficulty affording her meds and is taking Sx's of Janumet at 1/2 of recommended dose.    Further, the patient also has history of Vitamin D Deficiency of "14" in Oct 2015 and supplements vitamin D without any suspected side-effects. Last vitamin D was still low at 40 on 04/12/2015.  Medication Sig  . VITAMIN D 2000 UNITS Take 4,000 Units  2  times daily.  . Flaxseed 1300 MG CAPS Take 1 cap 2  times daily.  Marland Kitchen glipiZIDE  5 MG tablet TAKE ONE TABLET BY MOUTH THREE TIMES DAILY BEFORE MEALS.  Marland Kitchen levothyroxine  150 MCG tablet Take 1 to 1 1/2 tabs daily as directed. -  takes 1/2 tablet daily  . Omega-3 FISH OIL 1200 MG CAPS Take 1 cap 2  times daily.  Marland Kitchen JANUMET 50-1000 MG  Take 1 tab 2  times daily with a meal. - takes 1/2 tab 2 x/daily   Allergies  Allergen Reactions  . Metformin And Related   . Welchol [Colesevelam Hcl]    PMHx:   Past Medical History  Diagnosis Date  . Breast cancer ([place]) 05/2006    Right breast cancer  . Thyroid disease   . Diabetes mellitus without complication ([place])   . Cirrhosis ([place])   . Osteopenia   . Fibroids    Immunization History  Administered Date(s) Administered  . Influenza Split 03/06/2011, 01/06/2013  . Influenza, High Dose Seasonal PF 01/12/2014, 12/14/2014  . Pneumococcal Conjugate-13 01/12/2014  . Pneumococcal Polysaccharide-23 08/30/2014  . Tdap 06/10/2012   Past Surgical History  Procedure Laterality Date  . Breast lumpectomy with axillary lymph node biopsy Right 06/25/2006  . Cholecystectomy    . Abdominal hysterectomy      TAH BSO  . Tubal ligation  1978   FHx:    Reviewed / unchanged  SHx:    Reviewed / unchanged  Systems Review:  Constitutional: Denies fever, chills, wt changes, headaches, insomnia, fatigue, night sweats, change in appetite. Eyes: Denies redness, blurred vision, diplopia, discharge, itchy, watery eyes.  ENT: Denies discharge, congestion,  post nasal drip, epistaxis, sore throat, earache, hearing loss, dental pain, tinnitus, vertigo, sinus pain, snoring.  CV: Denies chest pain, palpitations, irregular heartbeat, syncope, dyspnea, diaphoresis, orthopnea, PND, claudication or edema. Respiratory: denies cough, dyspnea, DOE, pleurisy, hoarseness, laryngitis, wheezing.  Gastrointestinal: Denies dysphagia, odynophagia, heartburn, reflux, water brash, abdominal pain or cramps, nausea, vomiting, bloating, diarrhea, constipation, hematemesis, melena, hematochezia  or hemorrhoids. Genitourinary: Denies dysuria, frequency, urgency, nocturia, hesitancy, discharge, hematuria or flank  pain. Musculoskeletal: Denies arthralgias, myalgias, stiffness, jt. swelling, pain, limping or strain/sprain.  Skin: Denies pruritus, rash, hives, warts, acne, eczema or change in skin lesion(s). Neuro: No weakness, tremor, incoordination, spasms, paresthesia or pain. Psychiatric: Denies confusion, memory loss or sensory loss. Endo: Denies change in weight, skin or hair change.  Heme/Lymph: No excessive bleeding, bruising or enlarged lymph nodes.  Physical Exam  BP 126/68 mmHg  Pulse 72  Temp(Src) 97.5 F (36.4 C)  Resp 16  Ht 5' 2"  (1.575 m)  Wt 150 lb 6.4 oz (68.221 kg)  BMI 27.50 kg/m2  Appears well nourished and in no distress. Eyes: PERRLA, EOMs, conjunctiva no swelling or erythema. Sinuses: No frontal/maxillary tenderness ENT/Mouth: EAC's clear, TM's nl w/o erythema, bulging. Nares clear w/o erythema, swelling, exudates. Oropharynx clear without erythema or exudates. Oral hygiene is good. Tongue normal, non obstructing. Hearing intact.  Neck: Supple. Thyroid nl. Car 2+/2+ without bruits, nodes or JVD. Chest: Respirations nl with BS clear & equal w/o rales, rhonchi, wheezing or stridor.  Cor: Heart sounds normal w/ regular rate and rhythm without sig. murmurs, gallops, clicks, or rubs. Peripheral pulses normal and equal  without edema.  Abdomen: Soft & bowel sounds normal. Non-tender w/o guarding, rebound, hernias, masses, or organomegaly.  Lymphatics: Unremarkable.  Musculoskeletal: Full ROM all peripheral extremities, joint stability, 5/5 strength, and normal gait.  Skin: Warm, dry without exposed rashes, lesions or ecchymosis apparent.  Neuro: Cranial nerves intact, reflexes equal bilaterally. Sensory-motor testing grossly intact. Tendon reflexes grossly intact.  Pysch: Alert & oriented x 3.  Insight and judgement nl & appropriate. No ideations.  Assessment and Plan:  1. Elevated BP  - TSH  2. Hyperlipemia  - Lipid panel - TSH  3. Type 2 diabetes mellitus with  stage 2 chronic kidney disease, without long-term current use of insulin (HCC)  - Hemoglobin A1c - Insulin, random - Encouraged more compliant diet  - Patient was given sx's Tradgenta 5 mg for 2 months & given Rx MF 500 XR #360 x 1 rf to take 2 tabs bid - all to replace the Janumet of which we have no current Sx's.   - Recommended f/u OV in 6 weeks to monitor progress.   4. Vitamin D deficiency  - VITAMIN D 25 Hydroxy   5. Medication management  - CBC with Differential/Platelet - BASIC METABOLIC PANEL WITH GFR - Hepatic function panel - Magnesium   Recommended regular exercise, BP monitoring, weight control, and discussed med and SE's. Recommended labs to assess and monitor clinical status. Further disposition pending results of labs. Over 30 minutes of exam, counseling, chart review was performed

## 2015-07-27 LAB — HEPATIC FUNCTION PANEL
ALT: 40 U/L — AB (ref 6–29)
AST: 47 U/L — ABNORMAL HIGH (ref 10–35)
Albumin: 3.5 g/dL — ABNORMAL LOW (ref 3.6–5.1)
Alkaline Phosphatase: 91 U/L (ref 33–130)
BILIRUBIN DIRECT: 0.2 mg/dL (ref <=0.2)
Indirect Bilirubin: 1 mg/dL (ref 0.2–1.2)
TOTAL PROTEIN: 6.4 g/dL (ref 6.1–8.1)
Total Bilirubin: 1.2 mg/dL (ref 0.2–1.2)

## 2015-07-27 LAB — BASIC METABOLIC PANEL WITH GFR
BUN: 13 mg/dL (ref 7–25)
CALCIUM: 8.7 mg/dL (ref 8.6–10.4)
CO2: 23 mmol/L (ref 20–31)
Chloride: 105 mmol/L (ref 98–110)
Creat: 0.74 mg/dL (ref 0.60–0.93)
GFR, EST NON AFRICAN AMERICAN: 81 mL/min (ref >=60)
Glucose, Bld: 132 mg/dL — ABNORMAL HIGH (ref 65–99)
Potassium: 3.8 mmol/L (ref 3.5–5.3)
SODIUM: 138 mmol/L (ref 135–146)

## 2015-07-27 LAB — MAGNESIUM: Magnesium: 1.6 mg/dL (ref 1.5–2.5)

## 2015-07-27 LAB — LIPID PANEL
Cholesterol: 186 mg/dL (ref 125–200)
HDL: 75 mg/dL (ref >=46)
LDL CALC: 93 mg/dL (ref <130)
TRIGLYCERIDES: 90 mg/dL (ref <150)
Total CHOL/HDL Ratio: 2.5 Ratio (ref <=5.0)
VLDL: 18 mg/dL (ref <30)

## 2015-07-27 LAB — HEMOGLOBIN A1C
Hgb A1c MFr Bld: 9.4 % — ABNORMAL HIGH (ref <5.7)
MEAN PLASMA GLUCOSE: 223 mg/dL

## 2015-07-27 LAB — VITAMIN D 25 HYDROXY (VIT D DEFICIENCY, FRACTURES): Vit D, 25-Hydroxy: 36 ng/mL (ref 30–100)

## 2015-07-27 LAB — INSULIN, RANDOM: Insulin: 22.6 u[IU]/mL — ABNORMAL HIGH (ref 2.0–19.6)

## 2015-07-28 ENCOUNTER — Encounter: Payer: Self-pay | Admitting: Internal Medicine

## 2015-07-28 ENCOUNTER — Encounter: Payer: Self-pay | Admitting: *Deleted

## 2015-07-31 ENCOUNTER — Other Ambulatory Visit: Payer: Self-pay | Admitting: *Deleted

## 2015-07-31 MED ORDER — METFORMIN HCL ER 500 MG PO TB24: ORAL_TABLET | ORAL | Status: DC

## 2015-08-31 ENCOUNTER — Other Ambulatory Visit: Payer: Self-pay | Admitting: *Deleted

## 2015-08-31 MED ORDER — ONETOUCH ULTRA SYSTEM W/DEVICE KIT: PACK | Status: DC

## 2015-08-31 MED ORDER — GLUCOSE BLOOD VI STRP: ORAL_STRIP | Status: DC

## 2015-08-31 MED ORDER — ONETOUCH ULTRASOFT LANCETS MISC: Status: DC

## 2015-09-05 ENCOUNTER — Other Ambulatory Visit: Payer: Self-pay | Admitting: Internal Medicine

## 2015-09-05 ENCOUNTER — Ambulatory Visit (INDEPENDENT_AMBULATORY_CARE_PROVIDER_SITE_OTHER): Payer: Medicare HMO | Admitting: Internal Medicine

## 2015-09-05 ENCOUNTER — Encounter: Payer: Self-pay | Admitting: Internal Medicine

## 2015-09-05 VITALS — BP 100/52 | HR 76 | Temp 97.5°F | Resp 16 | Ht 62.0 in | Wt 149.4 lb

## 2015-09-05 DIAGNOSIS — D519 Vitamin B12 deficiency anemia, unspecified: Secondary | ICD-10-CM

## 2015-09-05 DIAGNOSIS — E1122 Type 2 diabetes mellitus with diabetic chronic kidney disease: Secondary | ICD-10-CM

## 2015-09-05 DIAGNOSIS — E039 Hypothyroidism, unspecified: Secondary | ICD-10-CM

## 2015-09-05 DIAGNOSIS — E559 Vitamin D deficiency, unspecified: Secondary | ICD-10-CM

## 2015-09-05 DIAGNOSIS — Z79899 Other long term (current) drug therapy: Secondary | ICD-10-CM | POA: Diagnosis not present

## 2015-09-05 DIAGNOSIS — N182 Chronic kidney disease, stage 2 (mild): Secondary | ICD-10-CM

## 2015-09-05 DIAGNOSIS — E538 Deficiency of other specified B group vitamins: Secondary | ICD-10-CM | POA: Diagnosis not present

## 2015-09-05 LAB — CBC WITH DIFFERENTIAL/PLATELET
BASOS ABS: 0 {cells}/uL (ref 0–200)
Basophils Relative: 0 %
EOS ABS: 48 {cells}/uL (ref 15–500)
Eosinophils Relative: 2 %
HEMATOCRIT: 36.3 % (ref 35.0–45.0)
HEMOGLOBIN: 11.9 g/dL (ref 11.7–15.5)
LYMPHS ABS: 480 {cells}/uL — AB (ref 850–3900)
Lymphocytes Relative: 20 %
MCH: 35.4 pg — AB (ref 27.0–33.0)
MCHC: 32.8 g/dL (ref 32.0–36.0)
MCV: 108 fL — AB (ref 80.0–100.0)
MONO ABS: 264 {cells}/uL (ref 200–950)
MPV: 10.6 fL (ref 7.5–12.5)
Monocytes Relative: 11 %
NEUTROS ABS: 1608 {cells}/uL (ref 1500–7800)
NEUTROS PCT: 67 %
Platelets: 103 10*3/uL — ABNORMAL LOW (ref 140–400)
RBC: 3.36 MIL/uL — ABNORMAL LOW (ref 3.80–5.10)
RDW: 14.3 % (ref 11.0–15.0)
WBC: 2.4 10*3/uL — ABNORMAL LOW (ref 3.8–10.8)

## 2015-09-05 LAB — VITAMIN B12: Vitamin B-12: 387 pg/mL (ref 200–1100)

## 2015-09-05 LAB — TSH: TSH: 0.14 m[IU]/L — AB

## 2015-09-05 NOTE — Progress Notes (Signed)
[facility]   Unk Pinto, M.D.    Uvaldo Bristle. Silverio Lay, P.A.-C      Starlyn Skeans, P.A.-C   [facility]                [address] [place], [place] [address] Telephone [phone] Telefax [phone]  Subjective:    Patient ID: Beverly Davis, female    DOB: 22-Dec-1940, 75 y.o.   MRN: [id number]  HPI  This very nice 75 yo MWF w/ poorly controlled T2_DM had recently had A1c 8.8% rising tio 9.4% in May with multifactorial matters of poor diet compliance compounded by inability to afford and hence having been out of her Janumet as well as a poor comprehension of Diabetic management. 6 weeks ago she was supplied Tradjenta 5 mg to last til today's visit and re-prescribed back to Metformin and she reports her FBG's are much improved in the range of 110-130's w/o hypoglycemia, diabetic polys, paresthesias or visual blurring. She had been sporadic in compliance with her thyroxine with an elevated TSH and now reassures that she is now taking properly. In addition her Vit D was 54 and likewise she endorses compliance. Further note she has lost 3-4 # in the interim.     At last lab draws she had macrocytosis with MVC of 106 , so labs will be rechecked today along with f/u TSH.   Medication Sig  . VITAMIN D 2000 UNITS Take 4,000 Units by mouth 2 (two) times daily.  . Flaxseed 1300 MG  Take 1 capsule by mouth 2 (two) times daily.  Marland Kitchen glipiZIDE  5 MG  TAKE ONE TAB 2 x  DAILY BEFORE MEALS.  Marland Kitchen Levothyroxine 150 MCG  Take 1 to 1 1/2 tabs daily as directed.   Tradjenta 5 mg - samples  Takes 1  Tablet daily  . metFORMIN -XR 500 MG  Take 2 tablets twice daily with a meal.  . Omega-3 FISH OIL 1200 MG  Take 1 capsule by mouth 2 (two) times daily.   Allergies  Allergen Reactions  . Metformin And Related   . Welchol [Colesevelam Hcl]    Past Medical History  Diagnosis Date  . Breast cancer ([place]) 05/2006    Right  breast cancer  . Thyroid disease   . Diabetes mellitus without complication ([place])   . Cirrhosis ([place])   . Osteopenia   . Fibroids    Past Surgical History  Procedure Laterality Date  . Breast lumpectomy with axillary lymph node biopsy Right 06/25/2006  . Cholecystectomy    . Abdominal hysterectomy      TAH BSO  . Tubal ligation  1978   Review of Systems  10 point systems review negative except as above.    Objective:   Physical Exam  BP 100/52 mmHg  Pulse 76  Temp(Src) 97.5 F (36.4 C)  Resp 16  Ht 5' 2"  (1.575 m)  Wt 149 lb 6.4 oz (67.767 kg)  BMI 27.32 kg/m2  HEENT - WNL Neck - supple. Nl Thyroid. Carotids 2+ & No bruits, nodes, JVD Chest - Clear. Cor - Nl HS. RRR w/o sig M. No edema. MS- FROM w/o deformities. Muscle power, tone and bulk Nl. Gait Nl. Neuro -  Nl w/o focal abnormalities.    Assessment & Plan:    1. Type 2 diabetes mellitus with stage 2 chronic kidney disease, without  long-term current use of insulin ([place])   2. Hypothyroidism, unspecified hypothyroidism type  - TSH  3. Vitamin D deficiency   4. Medication management   5. B12 deficiency anemia  - CBC with Differential/Platelet - Vitamin B12 - Methylmalonic acid, serum  6. Folate deficiency  - Folate RBC  - Discussed meds/SE's and extended time wa spent discussing & counseling in Diabetic diet and encoiuraged daily exercise. Given Sx'x Tradjenta 5 mg x 13 weeks to last til her appt in 3 months with Vicie Mutters, PA-C.  Over 25mnutes of exam, counseling, chart review and  critical decision making was performed.

## 2015-09-05 NOTE — Patient Instructions (Signed)

## 2015-09-06 ENCOUNTER — Ambulatory Visit: Payer: Self-pay | Admitting: Internal Medicine

## 2015-09-06 LAB — FOLATE RBC: RBC FOLATE: 515 ng/mL (ref >280)

## 2015-09-07 LAB — RETICULOCYTES
ABS Retic: 40080 cells/uL (ref 20000–80000)
RBC.: 3.34 MIL/uL — ABNORMAL LOW (ref 3.80–5.10)
Retic Ct Pct: 1.2 %

## 2015-09-07 LAB — METHYLMALONIC ACID, SERUM: Methylmalonic Acid, Quant: 84 nmol/L — ABNORMAL LOW (ref 87–318)

## 2015-09-08 ENCOUNTER — Encounter: Payer: Self-pay | Admitting: *Deleted

## 2015-09-30 ENCOUNTER — Other Ambulatory Visit: Payer: Self-pay | Admitting: Internal Medicine

## 2015-10-01 ENCOUNTER — Encounter: Payer: Self-pay | Admitting: *Deleted

## 2015-12-13 ENCOUNTER — Other Ambulatory Visit: Payer: Self-pay | Admitting: Internal Medicine

## 2015-12-14 ENCOUNTER — Encounter: Payer: Self-pay | Admitting: Physician Assistant

## 2015-12-27 ENCOUNTER — Encounter: Payer: Self-pay | Admitting: Physician Assistant

## 2015-12-27 ENCOUNTER — Ambulatory Visit (INDEPENDENT_AMBULATORY_CARE_PROVIDER_SITE_OTHER): Payer: Medicare HMO | Admitting: Physician Assistant

## 2015-12-27 VITALS — BP 118/70 | HR 73 | Temp 97.9°F | Resp 14 | Ht 62.0 in | Wt 145.0 lb

## 2015-12-27 DIAGNOSIS — D492 Neoplasm of unspecified behavior of bone, soft tissue, and skin: Secondary | ICD-10-CM | POA: Diagnosis not present

## 2015-12-27 DIAGNOSIS — Z23 Encounter for immunization: Secondary | ICD-10-CM | POA: Diagnosis not present

## 2015-12-27 DIAGNOSIS — Z0001 Encounter for general adult medical examination with abnormal findings: Secondary | ICD-10-CM

## 2015-12-27 DIAGNOSIS — E039 Hypothyroidism, unspecified: Secondary | ICD-10-CM

## 2015-12-27 DIAGNOSIS — C50911 Malignant neoplasm of unspecified site of right female breast: Secondary | ICD-10-CM | POA: Diagnosis not present

## 2015-12-27 DIAGNOSIS — E1122 Type 2 diabetes mellitus with diabetic chronic kidney disease: Secondary | ICD-10-CM | POA: Diagnosis not present

## 2015-12-27 DIAGNOSIS — N182 Chronic kidney disease, stage 2 (mild): Principal | ICD-10-CM

## 2015-12-27 DIAGNOSIS — Z136 Encounter for screening for cardiovascular disorders: Secondary | ICD-10-CM | POA: Diagnosis not present

## 2015-12-27 DIAGNOSIS — E785 Hyperlipidemia, unspecified: Secondary | ICD-10-CM

## 2015-12-27 DIAGNOSIS — Z79899 Other long term (current) drug therapy: Secondary | ICD-10-CM

## 2015-12-27 DIAGNOSIS — I10 Essential (primary) hypertension: Secondary | ICD-10-CM

## 2015-12-27 DIAGNOSIS — R6889 Other general symptoms and signs: Secondary | ICD-10-CM

## 2015-12-27 DIAGNOSIS — M810 Age-related osteoporosis without current pathological fracture: Secondary | ICD-10-CM

## 2015-12-27 DIAGNOSIS — E559 Vitamin D deficiency, unspecified: Secondary | ICD-10-CM | POA: Diagnosis not present

## 2015-12-27 DIAGNOSIS — R232 Flushing: Secondary | ICD-10-CM

## 2015-12-27 LAB — CBC WITH DIFFERENTIAL/PLATELET
BASOS PCT: 0 %
Basophils Absolute: 0 cells/uL (ref 0–200)
EOS PCT: 2 %
Eosinophils Absolute: 60 cells/uL (ref 15–500)
HEMATOCRIT: 36.7 % (ref 35.0–45.0)
HEMOGLOBIN: 12.4 g/dL (ref 11.7–15.5)
LYMPHS ABS: 870 {cells}/uL (ref 850–3900)
Lymphocytes Relative: 29 %
MCH: 35 pg — ABNORMAL HIGH (ref 27.0–33.0)
MCHC: 33.8 g/dL (ref 32.0–36.0)
MCV: 103.7 fL — ABNORMAL HIGH (ref 80.0–100.0)
MONO ABS: 330 {cells}/uL (ref 200–950)
MPV: 11 fL (ref 7.5–12.5)
Monocytes Relative: 11 %
NEUTROS ABS: 1740 {cells}/uL (ref 1500–7800)
Neutrophils Relative %: 58 %
Platelets: 116 10*3/uL — ABNORMAL LOW (ref 140–400)
RBC: 3.54 MIL/uL — AB (ref 3.80–5.10)
RDW: 14.2 % (ref 11.0–15.0)
WBC: 3 10*3/uL — AB (ref 3.8–10.8)

## 2015-12-27 LAB — TSH: TSH: 0.04 m[IU]/L — AB

## 2015-12-27 MED ORDER — LINAGLIPTIN 5 MG PO TABS
5.0000 mg | ORAL_TABLET | Freq: Every day | ORAL | 3 refills | Status: DC
Start: 2015-12-27 — End: 2017-11-11

## 2015-12-27 MED ORDER — TRIAMCINOLONE ACETONIDE 0.5 % EX CREA: 1.0000 "application " | TOPICAL_CREAM | Freq: Two times a day (BID) | CUTANEOUS | 2 refills | Status: DC

## 2015-12-27 MED ORDER — ALENDRONATE SODIUM 70 MG PO TABS: 70.0000 mg | ORAL_TABLET | ORAL | 3 refills | Status: DC

## 2015-12-27 NOTE — Patient Instructions (Addendum)
Can try oikos triple zero yogurt with cinnamon in the morning  Do not wear any new necklaces Put hydrocortisone cream from over the counter on your neck.   Breaking a bone can be serious, especially if the bone is in the hip. People who break a hip sometimes lose the ability to walk on their own. Many of them end up in a nursing home. That's why it is so important to avoid breaking a bone in the first place.   What can I do to keep my bones as healthy as possible? - You can: ?Eat foods with a lot of calcium, such as milk, yogurt, and green leafy vegetables  ?Eat foods with a lot of vitamin D, such as milk that has vitamin D added, and fish from the ocean  ?Take calcium and vitamin D pills (if you do not get enough from the food that you eat) ?Be active for at least 30 minutes, most days of the week ?Avoid smoking  ?Limit the amount of alcohol you drink to 1 to 2 drinks a day at most  What do osteoporosis medicines do? - If you have osteoporosis or a high risk of breaking a bone, the medicines your doctor prescribes can: ?Reduce bone loss  ?Increase bone density or keep it about the same ?Reduce the chances that you will break a bone   Bisphosphonates - Most people being treated for osteoporosis take these medicines first. If they do not work well enough or cause side effects that are too hard to handle, there are other options.   Most people take one pill every week. If your doctor prescribes a bisphosphonate pill, you must take the medicine exactly as directed. If you don't, the medicine can irritate your throat or stomach. For most bisphosphonate pills, you must:  ?Take the pill first thing in the morning, before you have anything to eat or drink.  ?Drink an 8-ounce glass of water with the pill, but not eat or drink anything else for 30 minutes or 1 hour (depending on which pill you take).  ?Avoid lying down for 30 minutes after taking the pill. (You must sit or stand during that  time).     Diabetes is a very complicated disease...lets simplify it.  An easy way to look at it to understand the complications is if you think of the extra sugar floating in your blood stream as glass shards floating through your blood stream.    Diabetes affects your small vessels first: 1) The glass shards (sugar) scraps down the tiny blood vessels in your eyes and lead to diabetic retinopathy, the leading cause of blindness in the [place]. Diabetes is the leading cause of newly diagnosed adult (45 to 74 years of age) blindness in the [place].  2) The glass shards scratches down the tiny vessels of your legs leading to nerve damage called neuropathy and can lead to amputations of your feet. More than 60% of all non-traumatic amputations of lower limbs occur in people with diabetes.  3) Over time the small vessels in your brain are shredded and closed off, individually this does not cause any problems but over a long period of time many of the small vessels being blocked can lead to Vascular Dementia.   4) Your kidney's are a filter system and have a "net" that keeps certain things in the body and lets bad things out. Sugar shreds this net and leads to kidney damage and eventually failure. Decreasing the sugar that  is destroying the net and certain blood pressure medications can help stop or decrease progression of kidney disease. Diabetes was the primary cause of kidney failure in 44 percent of all new cases in 2011.  5) Diabetes also destroys the small vessels in your penis that lead to erectile dysfunction. Eventually the vessels are so damaged that you may not be responsive to cialis or viagra.   Diabetes and your large vessels: Your larger vessels consist of your coronary arteries in your heart and the carotid vessels to your brain. Diabetes or even increased sugars put you at 300% increased risk of heart attack and stroke and this is why.. The sugar scrapes down your large blood  vessels and your body sees this as an internal injury and tries to repair itself. Just like you get a scab on your skin, your platelets will stick to the blood vessel wall trying to heal it. This is why we have diabetics on low dose aspirin daily, this prevents the platelets from sticking and can prevent plaque formation. In addition, your body takes cholesterol and tries to shove it into the open wound. This is why we want your LDL, or bad cholesterol, below 70.   The combination of platelets and cholesterol over 5-10 years forms plaque that can break off and cause a heart attack or stroke.   PLEASE REMEMBER:  Diabetes is preventable! Up to 65 percent of complications and morbidities among individuals with type 2 diabetes can be prevented, delayed, or effectively treated and minimized with regular visits to a health professional, appropriate monitoring and medication, and a healthy diet and lifestyle.

## 2015-12-27 NOTE — Progress Notes (Signed)
Complete Physical  Assessment and Plan: Type 2 diabetes mellitus with other diabetic kidney complication Discussed general issues about diabetes pathophysiology and management., Educational material distributed., Suggested low cholesterol diet., Encouraged aerobic exercise., Discussed foot care., Reminded to get yearly retinal exam. - Hemoglobin A1c - Insulin, fasting - EKG 12-Lead - HM DIABETES FOOT EXAM -     linagliptin (TRADJENTA) 5 MG TABS tablet; Take 1 tablet (5 mg total) by mouth daily.  Elevated BP - CBC with Differential/Platelet - Hepatic function panel - BASIC METABOLIC PANEL WITH GFR - Urinalysis, Routine w reflex microscopic (not at [facility]) - Microalbumin / creatinine urine ratio - EKG 12-Lead  Hypothyroidism, unspecified hypothyroidism type Hypothyroidism-check TSH level, continue medications the same, reminded to take on an empty stomach 30-56mns before food.  - TSH  Hyperlipemia -continue medications, check lipids, decrease fatty foods, increase activity.  - Lipid panel  Vitamin D deficiency - Vit D  25 hydroxy (rtn osteoporosis monitoring)   Medication management - Magnesium  Breast cancer, right Continue getting MGM  Need for prophylactic vaccination and inoculation against influenza - Flu vaccine HIGH DOSE PF  Health Maintenance , up to date on other shots.   Osteoporosis, unspecified osteoporosis type, unspecified pathological fracture presence -     alendronate (FOSAMAX) 70 MG tablet; Take 1 tablet (70 mg total) by mouth once a week. Repeat DEXA 2018  Abnormal skin growth -     triamcinolone cream (KENALOG) 0.5 %; Apply 1 application topically 2 (two) times daily. - folow up DERM    Discussed med's effects and SE's. Screening labs and tests as requested with regular follow-up as recommended. Over 40 minutes of exam, counseling, chart review, and complex, high level critical decision making was performed this visit.   HPI  75y.o. female   presents for a complete physical.  Her blood pressure has been controlled at home, today their BP is BP: 118/70 She does not workout but takes care of her grand kids, youngest is in day care half a day, and keeps the rest out of school. She denies chest pain, shortness of breath, dizziness.  She is not on cholesterol medication, declines statins. Her cholesterol is not at goal. The cholesterol last visit was:   Lab Results  Component Value Date   CHOL 186 07/26/2015   HDL 75 07/26/2015   LDLCALC 93 07/26/2015   TRIG 90 07/26/2015   CHOLHDL 2.5 07/26/2015   She has been working on diet and exercise for diabetes, she is on bASA, she is not on ACE/ARB due to hypotension, sugars have been 120's, she is on trajenta, metformin and glipizide 5 mg 2 in the AM and 1 PM, she has an accucheck,  and denies hypoglycemia , paresthesia of the feet, polydipsia, polyuria and visual disturbances. Last A1C in the office was:  Lab Results  Component Value Date   HGBA1C 9.4 (H) 07/26/2015   Lab Results  Component Value Date   GFRNONAA 81 07/26/2015   Patient is on Vitamin D supplement.   Lab Results  Component Value Date   VD25OH 36 07/26/2015     She is on thyroid medication. Her medication was changed last visit, M,W,F 1 and 1/2 pill and 1 pill the other days.   Lab Results  Component Value Date   TSH 0.14 (L) 09/05/2015  .  She has history of osteoporosis, stopped fosamax x 2 years. History of right breast cancer.  Scaly area on left neck/ear, x 10 months, wishes to  refer to derm.   Current Medications:  Current Outpatient Prescriptions on File Prior to Visit  Medication Sig Dispense Refill  . Blood Glucose Monitoring Suppl (ONE TOUCH ULTRA SYSTEM KIT) w/Device KIT Check blood sugar 1 time daily-DX-E11.29 1 each 0  . Cholecalciferol (VITAMIN D) 2000 UNITS tablet Take 4,000 Units by mouth 2 (two) times daily.    . Flaxseed, Linseed, 1300 MG CAPS Take 1 capsule by mouth 2 (two) times daily.     Marland Kitchen glipiZIDE (GLUCOTROL) 5 MG tablet TAKE ONE TABLET BY MOUTH THREE TIMES DAILY BEFORE  MEALS 270 tablet 0  . glucose blood test strip Check blood sugar 1 time daily-DX-E11.29. 100 each 6  . Lancets (ONETOUCH ULTRASOFT) lancets Check blood sugar 1 time daily-DX-E11.29 100 each 6  . levothyroxine (SYNTHROID, LEVOTHROID) 150 MCG tablet TAKE ONE TO ONE & ONE-HALF TABLETS BY MOUTH  DAILY AS  DIRECTED. 135 tablet 0  . metFORMIN (GLUCOPHAGE-XR) 500 MG 24 hr tablet Take 2 tablets twice daily with a meal. 360 tablet 1  . Omega-3 Fatty Acids (FISH OIL) 1200 MG CAPS Take 1 capsule by mouth 2 (two) times daily.     No current facility-administered medications on file prior to visit.    Health Maintenance:   Immunization History  Administered Date(s) Administered  . Influenza Split 03/06/2011, 01/06/2013  . Influenza, High Dose Seasonal PF 01/12/2014, 12/14/2014, 12/27/2015  . Pneumococcal Conjugate-13 01/12/2014  . Pneumococcal Polysaccharide-23 08/30/2014  . Tdap 06/10/2012   Tetanus: 2014 Pneumovax:2016 Prevnar 13: 2015 Flu vaccine:2016 DUE Zostavax: declines due to cost  IRJ:JOACZY MGM: 08/2014 DEXA: 11/ 2016 Colonoscopy: 02/2015 EGD 02/2015, Dr. Benson [place] Thyroid scan 2011 AB [place] 2013  Last Dental Exam: Dr. Nolon Lennert Last Eye Exam: Dr. Sabra Heck 08/2015 see notes in media Patient Care Team: Unk Pinto, MD as PCP - General (Internal Medicine) Carol Ada, MD as Consulting Physician (Gastroenterology) Inda Castle, MD as Consulting Physician (Gastroenterology)  Allergies:  Allergies  Allergen Reactions  . Metformin And Related   . Welchol [Colesevelam Hcl]    Medical History:  Past Medical History:  Diagnosis Date  . Breast cancer ([place]) 05/2006   Right breast cancer  . Cirrhosis ([place])   . Diabetes mellitus without complication ([place])   . Fibroids   . Osteopenia   . Thyroid disease    Surgical History:  Past Surgical History:  Procedure Laterality Date  . ABDOMINAL  HYSTERECTOMY     TAH BSO  . BREAST LUMPECTOMY WITH AXILLARY LYMPH NODE BIOPSY Right 06/25/2006  . CHOLECYSTECTOMY    . TUBAL LIGATION  1978   Family History:  Family History  Problem Relation Age of Onset  . Cancer Mother     Brain cancer  . Cancer Father     Prostate cancer  . Diabetes Sister   . Hypertension Sister   . Liver disease Brother   . Diabetes Maternal Aunt   . Heart Problems Maternal Grandfather   . Cancer Paternal Grandfather     Unknown cancer  . Diabetes Sister    Social History:  Social History  Substance Use Topics  . Smoking status: Never Smoker  . Smokeless tobacco: Never Used  . Alcohol use No   Review of Systems: Review of Systems  Constitutional: Negative.   HENT: Negative.   Eyes: Negative.   Respiratory: Negative.   Cardiovascular: Negative.   Gastrointestinal: Negative.   Genitourinary: Negative.   Musculoskeletal: Negative.   Skin: Positive for rash.  Neurological: Negative.   Psychiatric/Behavioral: Negative.  Physical Exam: Estimated body mass index is 26.52 kg/m as calculated from the following:   Height as of this encounter: 5' 2"  (1.575 m).   Weight as of this encounter: 145 lb (65.8 kg). BP 118/70   Pulse 73   Temp 97.9 F (36.6 C)   Resp 14   Ht 5' 2"  (1.575 m)   Wt 145 lb (65.8 kg)   SpO2 96%   BMI 26.52 kg/m  General Appearance: Well nourished, in no apparent distress.  Eyes: PERRLA, EOMs, conjunctiva no swelling or erythema, normal fundi and vessels.  Sinuses: No Frontal/maxillary tenderness  ENT/Mouth: Ext aud canals clear, normal light reflex with TMs without erythema, bulging. Good dentition. No erythema, swelling, or exudate on post pharynx. Tonsils not swollen or erythematous. Hearing normal.  Neck: Supple, thyroid normal. No bruits  Respiratory: Respiratory effort normal, BS equal bilaterally without rales, rhonchi, wheezing or stridor.  Cardio: RRR with 2/6 RSB crescendo/decrescendo systolic murmur with  radiation into carotids, rubs or gallops. Brisk peripheral pulses without edema.  Chest: symmetric, with normal excursions and percussion.  Breasts: Symmetric, without lumps, nipple discharge, retractions. Well healed scar lateral right breast.   Abdomen: Soft, nontender, no guarding, rebound, hernias, masses, or organomegaly.  Lymphatics: Non tender without lymphadenopathy.  Genitourinary: defer Musculoskeletal: Full ROM all peripheral extremities,5/5 strength, and normal gait.  Skin:  varicose veins bilateral legs without erythema, warmth, Warm, dry without rashes, lesions, ecchymosis. Neuro: Cranial nerves intact, reflexes equal bilaterally. Normal muscle tone, no cerebellar symptoms. Sensation intact.  Psych: Awake and oriented X 3, normal affect, Insight and Judgment appropriate.   EKG: CBBB no ST changes. AORTA SCAN: defer  Vicie Mutters 3:12 PM [facility]

## 2015-12-28 LAB — BASIC METABOLIC PANEL WITH GFR
BUN: 12 mg/dL (ref 7–25)
CO2: 26 mmol/L (ref 20–31)
Calcium: 9.6 mg/dL (ref 8.6–10.4)
Chloride: 106 mmol/L (ref 98–110)
Creat: 0.78 mg/dL (ref 0.60–0.93)
GFR, EST AFRICAN AMERICAN: 87 mL/min (ref >=60)
GFR, EST NON AFRICAN AMERICAN: 75 mL/min (ref >=60)
GLUCOSE: 105 mg/dL — AB (ref 65–99)
POTASSIUM: 3.8 mmol/L (ref 3.5–5.3)
SODIUM: 141 mmol/L (ref 135–146)

## 2015-12-28 LAB — HEPATIC FUNCTION PANEL
ALBUMIN: 3.8 g/dL (ref 3.6–5.1)
ALT: 40 U/L — AB (ref 6–29)
AST: 53 U/L — AB (ref 10–35)
Alkaline Phosphatase: 99 U/L (ref 33–130)
BILIRUBIN DIRECT: 0.2 mg/dL (ref <=0.2)
Indirect Bilirubin: 0.9 mg/dL (ref 0.2–1.2)
TOTAL PROTEIN: 6.8 g/dL (ref 6.1–8.1)
Total Bilirubin: 1.1 mg/dL (ref 0.2–1.2)

## 2015-12-28 LAB — MICROALBUMIN / CREATININE URINE RATIO
Creatinine, Urine: 171 mg/dL (ref 20–320)
Microalb, Ur: <0.2 mg/dL

## 2015-12-28 LAB — URINALYSIS, ROUTINE W REFLEX MICROSCOPIC
Bilirubin Urine: NEGATIVE
GLUCOSE, UA: NEGATIVE
HGB URINE DIPSTICK: NEGATIVE
LEUKOCYTES UA: NEGATIVE
Nitrite: NEGATIVE
PROTEIN: NEGATIVE
Specific Gravity, Urine: 1.025 (ref 1.001–1.035)
pH: 5.5 (ref 5.0–8.0)

## 2015-12-28 LAB — LIPID PANEL
CHOL/HDL RATIO: 3 ratio (ref <=5.0)
Cholesterol: 185 mg/dL (ref 125–200)
HDL: 62 mg/dL (ref >=46)
LDL Cholesterol: 86 mg/dL (ref <130)
Triglycerides: 184 mg/dL — ABNORMAL HIGH (ref <150)
VLDL: 37 mg/dL — ABNORMAL HIGH (ref <30)

## 2015-12-28 LAB — HEMOGLOBIN A1C
HEMOGLOBIN A1C: 6.4 % — AB (ref <5.7)
MEAN PLASMA GLUCOSE: 137 mg/dL

## 2016-01-28 ENCOUNTER — Other Ambulatory Visit: Payer: Self-pay | Admitting: Internal Medicine

## 2016-01-31 ENCOUNTER — Ambulatory Visit (INDEPENDENT_AMBULATORY_CARE_PROVIDER_SITE_OTHER): Payer: PPO

## 2016-01-31 DIAGNOSIS — R748 Abnormal levels of other serum enzymes: Secondary | ICD-10-CM

## 2016-01-31 DIAGNOSIS — E039 Hypothyroidism, unspecified: Secondary | ICD-10-CM

## 2016-01-31 LAB — HEPATIC FUNCTION PANEL
ALBUMIN: 3.6 g/dL (ref 3.6–5.1)
ALT: 38 U/L — AB (ref 6–29)
AST: 48 U/L — ABNORMAL HIGH (ref 10–35)
Alkaline Phosphatase: 94 U/L (ref 33–130)
BILIRUBIN DIRECT: 0.3 mg/dL — AB (ref <=0.2)
BILIRUBIN TOTAL: 1.1 mg/dL (ref 0.2–1.2)
Indirect Bilirubin: 0.8 mg/dL (ref 0.2–1.2)
Total Protein: 6.2 g/dL (ref 6.1–8.1)

## 2016-01-31 LAB — TSH: TSH: 0.01 mIU/L — ABNORMAL LOW

## 2016-02-01 LAB — HEPATITIS B CORE ANTIBODY, TOTAL: HEP B C TOTAL AB: NONREACTIVE

## 2016-02-01 LAB — HEPATITIS C ANTIBODY: HCV AB: NEGATIVE

## 2016-02-01 LAB — HEPATITIS A ANTIBODY, TOTAL: HEP A TOTAL AB: REACTIVE — AB

## 2016-02-01 LAB — HEPATITIS B SURFACE ANTIBODY,QUALITATIVE: Hep B S Ab: NEGATIVE

## 2016-02-02 LAB — HEPATITIS B E ANTIBODY: Hepatitis Be Antibody: NONREACTIVE

## 2016-02-21 ENCOUNTER — Other Ambulatory Visit: Payer: Self-pay | Admitting: Internal Medicine

## 2016-02-22 ENCOUNTER — Other Ambulatory Visit: Payer: Self-pay | Admitting: *Deleted

## 2016-02-22 ENCOUNTER — Other Ambulatory Visit: Payer: Self-pay | Admitting: Internal Medicine

## 2016-02-22 MED ORDER — GLIPIZIDE 5 MG PO TABS: ORAL_TABLET | ORAL | 0 refills | Status: DC

## 2016-03-18 ENCOUNTER — Encounter (HOSPITAL_COMMUNITY): Payer: Self-pay | Admitting: Emergency Medicine

## 2016-03-18 ENCOUNTER — Inpatient Hospital Stay (HOSPITAL_COMMUNITY)
Admission: EM | Admit: 2016-03-18 | Discharge: 2016-03-23 | DRG: 872 | Disposition: A | Discharge status: Home / Self Care | Payer: Medicare HMO | Attending: Internal Medicine | Admitting: Internal Medicine

## 2016-03-18 ENCOUNTER — Emergency Department (HOSPITAL_COMMUNITY): Payer: Medicare HMO

## 2016-03-18 DIAGNOSIS — W19XXXA Unspecified fall, initial encounter: Secondary | ICD-10-CM | POA: Diagnosis not present

## 2016-03-18 DIAGNOSIS — Z808 Family history of malignant neoplasm of other organs or systems: Secondary | ICD-10-CM

## 2016-03-18 DIAGNOSIS — R160 Hepatomegaly, not elsewhere classified: Secondary | ICD-10-CM

## 2016-03-18 DIAGNOSIS — R000 Tachycardia, unspecified: Secondary | ICD-10-CM | POA: Diagnosis present

## 2016-03-18 DIAGNOSIS — E119 Type 2 diabetes mellitus without complications: Secondary | ICD-10-CM | POA: Diagnosis present

## 2016-03-18 DIAGNOSIS — R7989 Other specified abnormal findings of blood chemistry: Secondary | ICD-10-CM

## 2016-03-18 DIAGNOSIS — D709 Neutropenia, unspecified: Secondary | ICD-10-CM | POA: Diagnosis present

## 2016-03-18 DIAGNOSIS — I10 Essential (primary) hypertension: Secondary | ICD-10-CM | POA: Diagnosis present

## 2016-03-18 DIAGNOSIS — K7581 Nonalcoholic steatohepatitis (NASH): Secondary | ICD-10-CM | POA: Diagnosis present

## 2016-03-18 DIAGNOSIS — A419 Sepsis, unspecified organism: Secondary | ICD-10-CM | POA: Diagnosis not present

## 2016-03-18 DIAGNOSIS — Z853 Personal history of malignant neoplasm of breast: Secondary | ICD-10-CM

## 2016-03-18 DIAGNOSIS — M810 Age-related osteoporosis without current pathological fracture: Secondary | ICD-10-CM | POA: Diagnosis present

## 2016-03-18 DIAGNOSIS — E876 Hypokalemia: Secondary | ICD-10-CM | POA: Diagnosis present

## 2016-03-18 DIAGNOSIS — R296 Repeated falls: Secondary | ICD-10-CM | POA: Diagnosis present

## 2016-03-18 DIAGNOSIS — K7290 Hepatic failure, unspecified without coma: Secondary | ICD-10-CM | POA: Diagnosis not present

## 2016-03-18 DIAGNOSIS — E039 Hypothyroidism, unspecified: Secondary | ICD-10-CM | POA: Diagnosis present

## 2016-03-18 DIAGNOSIS — E872 Acidosis: Secondary | ICD-10-CM | POA: Diagnosis present

## 2016-03-18 DIAGNOSIS — K7460 Unspecified cirrhosis of liver: Secondary | ICD-10-CM | POA: Diagnosis present

## 2016-03-18 DIAGNOSIS — R531 Weakness: Secondary | ICD-10-CM

## 2016-03-18 DIAGNOSIS — Z79810 Long term (current) use of selective estrogen receptor modulators (SERMs): Secondary | ICD-10-CM

## 2016-03-18 DIAGNOSIS — E860 Dehydration: Secondary | ICD-10-CM | POA: Diagnosis present

## 2016-03-18 DIAGNOSIS — C220 Liver cell carcinoma: Secondary | ICD-10-CM | POA: Diagnosis present

## 2016-03-18 DIAGNOSIS — D6959 Other secondary thrombocytopenia: Secondary | ICD-10-CM | POA: Diagnosis present

## 2016-03-18 DIAGNOSIS — R161 Splenomegaly, not elsewhere classified: Secondary | ICD-10-CM | POA: Diagnosis present

## 2016-03-18 DIAGNOSIS — J019 Acute sinusitis, unspecified: Secondary | ICD-10-CM | POA: Diagnosis present

## 2016-03-18 DIAGNOSIS — K7682 Hepatic encephalopathy: Secondary | ICD-10-CM | POA: Diagnosis present

## 2016-03-18 DIAGNOSIS — Z79899 Other long term (current) drug therapy: Secondary | ICD-10-CM

## 2016-03-18 DIAGNOSIS — D61818 Other pancytopenia: Secondary | ICD-10-CM

## 2016-03-18 DIAGNOSIS — Z888 Allergy status to other drugs, medicaments and biological substances status: Secondary | ICD-10-CM

## 2016-03-18 HISTORY — DX: Thrombocytopenia, unspecified: D69.6

## 2016-03-18 LAB — CBC
HCT: 34 % — ABNORMAL LOW (ref 36.0–46.0)
HEMOGLOBIN: 11.6 g/dL — AB (ref 12.0–15.0)
MCH: 35.3 pg — ABNORMAL HIGH (ref 26.0–34.0)
MCHC: 34.1 g/dL (ref 30.0–36.0)
MCV: 103.3 fL — ABNORMAL HIGH (ref 78.0–100.0)
PLATELETS: 84 10*3/uL — AB (ref 150–400)
RBC: 3.29 MIL/uL — AB (ref 3.87–5.11)
RDW: 13.5 % (ref 11.5–15.5)
WBC: 3.1 10*3/uL — ABNORMAL LOW (ref 4.0–10.5)

## 2016-03-18 LAB — URINALYSIS, ROUTINE W REFLEX MICROSCOPIC
Bilirubin Urine: NEGATIVE
Glucose, UA: NEGATIVE mg/dL
Ketones, ur: 20 mg/dL — AB
Leukocytes, UA: NEGATIVE
Nitrite: NEGATIVE
PH: 5 (ref 5.0–8.0)
Protein, ur: NEGATIVE mg/dL
SPECIFIC GRAVITY, URINE: 1.025 (ref 1.005–1.030)

## 2016-03-18 LAB — DIFFERENTIAL
BASOS ABS: 0 10*3/uL (ref 0.0–0.1)
BASOS PCT: 0 %
EOS ABS: 0 10*3/uL (ref 0.0–0.7)
Eosinophils Relative: 1 %
LYMPHS ABS: 0.2 10*3/uL — AB (ref 0.7–4.0)
Lymphocytes Relative: 7 %
Monocytes Absolute: 0.2 10*3/uL (ref 0.1–1.0)
Monocytes Relative: 5 %
NEUTROS PCT: 87 %
Neutro Abs: 2.7 10*3/uL (ref 1.7–7.7)

## 2016-03-18 LAB — APTT: APTT: 31 s (ref 24–36)

## 2016-03-18 LAB — COMPREHENSIVE METABOLIC PANEL
ALBUMIN: 3.3 g/dL — AB (ref 3.5–5.0)
ALT: 44 U/L (ref 14–54)
ANION GAP: 5 (ref 5–15)
AST: 60 U/L — AB (ref 15–41)
Alkaline Phosphatase: 74 U/L (ref 38–126)
BILIRUBIN TOTAL: 1.2 mg/dL (ref 0.3–1.2)
BUN: 9 mg/dL (ref 6–20)
CHLORIDE: 107 mmol/L (ref 101–111)
CO2: 21 mmol/L — AB (ref 22–32)
Calcium: 9 mg/dL (ref 8.9–10.3)
Creatinine, Ser: 0.7 mg/dL (ref 0.44–1.00)
GFR calc Af Amer: >60 mL/min (ref >60)
GFR calc non Af Amer: >60 mL/min (ref >60)
GLUCOSE: 147 mg/dL — AB (ref 65–99)
POTASSIUM: 3.8 mmol/L (ref 3.5–5.1)
SODIUM: 133 mmol/L — AB (ref 135–145)
Total Protein: 6.2 g/dL — ABNORMAL LOW (ref 6.5–8.1)

## 2016-03-18 LAB — I-STAT CG4 LACTIC ACID, ED: LACTIC ACID, VENOUS: 3.89 mmol/L — AB (ref 0.5–1.9)

## 2016-03-18 LAB — RAPID URINE DRUG SCREEN, HOSP PERFORMED
AMPHETAMINES: NOT DETECTED
BARBITURATES: NOT DETECTED
BENZODIAZEPINES: NOT DETECTED
COCAINE: NOT DETECTED
OPIATES: NOT DETECTED
TETRAHYDROCANNABINOL: NOT DETECTED

## 2016-03-18 LAB — I-STAT TROPONIN, ED: Troponin i, poc: 0 ng/mL (ref 0.00–0.08)

## 2016-03-18 LAB — AMMONIA: AMMONIA: 86 umol/L — AB (ref 9–35)

## 2016-03-18 LAB — ETHANOL: Alcohol, Ethyl (B): <5 mg/dL (ref <5)

## 2016-03-18 LAB — PROTIME-INR
INR: 1.26
PROTHROMBIN TIME: 15.8 s — AB (ref 11.4–15.2)

## 2016-03-18 MED ORDER — LACTULOSE 10 GM/15ML PO SOLN
30.0000 g | Freq: Once | ORAL | Status: AC
  Administered 2016-03-19: 30 g via ORAL
  Filled 2016-03-18: qty 45

## 2016-03-18 MED ORDER — SODIUM CHLORIDE 0.9 % IV BOLUS (SEPSIS)
1000.0000 mL | Freq: Once | INTRAVENOUS | Status: AC
  Administered 2016-03-18: 1000 mL via INTRAVENOUS

## 2016-03-18 NOTE — H&P (Signed)
History and Physical    Beverly Davis CBU:[id number] DOB: 25-Feb-1941 DOA: 03/18/2016  Referring MD/NP/PA:   PCP: Alesia Richards, MD   Patient coming from:  The patient is coming from home.  At baseline, pt is independent for most of ADL.   Chief Complaint: Altered mental status, hand tremor, multiple falls  HPI: Beverly Davis is a 75 y.o. female with medical history significant of liver cirrhosis, diabetes mellitus, hypothyroidism, breast cancer (s/p of right lumpectomy,s/p of radiation therapy, no chemotherapy 2008), osteoporosis, chronic thrombocytopenia, who presents with altered mental status and multiple falls.  Per family, pt was noted to be confused and disoriented since yesterday. She has generalized weakness, but no unilateral weakness, vision change or hearing loss. She was noted to have mild hand tremor. She fell multiple times without significant injury. No headache or neck pain. Patient has mild dry cough intermittently in the past 2 weeks, but no runny nose, sore throat, shortness breath or chest pain. She denies nausea, vomiting, diarrhea, abdominal pain, symptoms for UTI. Per her husband, patient could have mild abdominal pain yesterday.   ED Course: pt was found to have elevated ammonia level 86, abnormal liver function with AST 60, ALT 44, total bilirubin 1.6, pancytopenia with WBC 3.1, hemoglobin 11.6 and platelet 84, lactate 3.89, INR 1.26, PTT 31, troponin negative, UDS negative, creatinine normal, temperature 99.9, tachycardia, oxygen saturation 96% on room air, negative chest x-ray for acute abnormalities, CT head is negative for acute intracranial abnormalities, but showed possible acute sinusitis.  Review of Systems: Could not be reviewed accurately due to altered mental status.  Allergy:  Allergies  Allergen Reactions  . Metformin And Related Other (See Comments)    TAKING MEDICATION CURRENTLY  . Welchol [Colesevelam Hcl] Other (See Comments)     Past Medical History:  Diagnosis Date  . Breast cancer ([place]) 05/2006   Right breast cancer  . Cirrhosis ([place])   . Diabetes mellitus without complication ([place])   . Fibroids   . Osteopenia   . Thrombocytopenia ([place])   . Thyroid disease     Past Surgical History:  Procedure Laterality Date  . ABDOMINAL HYSTERECTOMY     TAH BSO  . BREAST LUMPECTOMY WITH AXILLARY LYMPH NODE BIOPSY Right 06/25/2006  . CHOLECYSTECTOMY    . TUBAL LIGATION  1978    Social History:  reports that she has never smoked. She has never used smokeless tobacco. She reports that she does not drink alcohol or use drugs.  Family History:  Family History  Problem Relation Age of Onset  . Cancer Mother     Brain cancer  . Cancer Father     Prostate cancer  . Diabetes Sister   . Hypertension Sister   . Liver disease Brother   . Diabetes Maternal Aunt   . Heart Problems Maternal Grandfather   . Cancer Paternal Grandfather     Unknown cancer  . Diabetes Sister      Prior to Admission medications   Medication Sig Start Date End Date Taking? Authorizing Provider  alendronate (FOSAMAX) 70 MG tablet Take 1 tablet (70 mg total) by mouth once a week. 12/27/15   Vicie Mutters, PA-C  Blood Glucose Monitoring Suppl (ONE TOUCH ULTRA SYSTEM KIT) w/Device KIT Check blood sugar 1 time daily-DX-E11.29 08/31/15   Unk Pinto, MD  Cholecalciferol (VITAMIN D) 2000 UNITS tablet Take 4,000 Units by mouth 2 (two) times daily.    Historical Provider, MD  Flaxseed, Linseed, 1300 MG CAPS Take 1  capsule by mouth 2 (two) times daily.    Historical Provider, MD  glipiZIDE (GLUCOTROL) 5 MG tablet TAKE ONE TABLET BY MOUTH THREE TIMES DAILY BEFORE MEAL(S). 02/22/16   Unk Pinto, MD  glucose blood test strip Check blood sugar 1 time daily-DX-E11.29. 08/31/15   Unk Pinto, MD  Lancets [facility] ULTRASOFT) lancets Check blood sugar 1 time daily-DX-E11.29 08/31/15   Unk Pinto, MD  levothyroxine (SYNTHROID, LEVOTHROID) 150  MCG tablet TAKE ONE TO ONE & ONE-HALF TABLETS BY MOUTH  DAILY AS  DIRECTED. 12/13/15   Unk Pinto, MD  linagliptin (TRADJENTA) 5 MG TABS tablet Take 1 tablet (5 mg total) by mouth daily. 12/27/15   Vicie Mutters, PA-C  metFORMIN (GLUCOPHAGE-XR) 500 MG 24 hr tablet TAKE TWO TABLETS BY MOUTH TWICE DAILY WITH  A  MEAL. 01/29/16   Unk Pinto, MD  Omega-3 Fatty Acids (FISH OIL) 1200 MG CAPS Take 1 capsule by mouth 2 (two) times daily.    Historical Provider, MD  triamcinolone cream (KENALOG) 0.5 % Apply 1 application topically 2 (two) times daily. 12/27/15   Vicie Mutters, PA-C    Physical Exam: Vitals:   03/18/16 2330 03/18/16 2345 03/19/16 0000 03/19/16 0015  BP: (!) 115/54 118/63 (!) 115/51 123/59  Pulse: 102 104 105 103  Resp: _0 Temp:      TempSrc:      SpO2: 97% 97% 97% 96%  Weight:      Height:       General: Not in acute distress HEENT:       Eyes: PERRL, EOMI, no scleral icterus.       ENT: No discharge from the ears and nose, no pharynx injection, no tonsillar enlargement.        Neck: No JVD, no bruit, no mass felt. Heme: No neck lymph node enlargement. Cardiac: S1/S2, RRR, No murmurs, No gallops or rubs. Respiratory: No rales, wheezing, rhonchi or rubs. GI: Soft, nondistended, nontender, no rebound pain, no organomegaly, BS present. GU: No hematuria Ext: No pitting leg edema bilaterally. 2+DP/PT pulse bilaterally. Musculoskeletal: No joint deformities, No joint redness or warmth, no limitation of ROM in spin. Skin: No rashes.  Neuro: confused, oriented to place and person, but not to time. Cranial nerves II-XII grossly intact. Muscle strength 5/5 in all extremities, sensation to light touch intact. Brachial reflex 2+ bilaterally. Negative Babinski's sign. Pt has asterixis.  Psych: Patient is not psychotic, no suicidal or hemocidal ideation.  Labs on Admission: I have personally reviewed following labs and imaging studies  CBC:  Recent Labs Lab  03/18/16 2114  WBC 3.1*  NEUTROABS 2.7  HGB 11.6*  HCT 34.0*  MCV 103.3*  PLT 84*   Basic Metabolic Panel:  Recent Labs Lab 03/18/16 2114  NA 133*  K 3.8  CL 107  CO2 21*  GLUCOSE 147*  BUN 9  CREATININE 0.70  CALCIUM 9.0   GFR: Estimated Creatinine Clearance: 56.3 mL/min (by C-G formula based on SCr of 0.7 mg/dL). Liver Function Tests:  Recent Labs Lab 03/18/16 2114  AST 60*  ALT 44  ALKPHOS 74  BILITOT 1.2  PROT 6.2*  ALBUMIN 3.3*   No results for input(s): LIPASE, AMYLASE in the last 168 hours.  Recent Labs Lab 03/18/16 2114  AMMONIA 86*   Coagulation Profile:  Recent Labs Lab 03/18/16 2114  INR 1.26   Cardiac Enzymes: No results for input(s): CKTOTAL, CKMB, CKMBINDEX, TROPONINI in the last 168 hours. BNP (last 3 results) No results for  input(s): PROBNP in the last 8760 hours. HbA1C: No results for input(s): HGBA1C in the last 72 hours. CBG: No results for input(s): GLUCAP in the last 168 hours. Lipid Profile: No results for input(s): CHOL, HDL, LDLCALC, TRIG, CHOLHDL, LDLDIRECT in the last 72 hours. Thyroid Function Tests: No results for input(s): TSH, T4TOTAL, FREET4, T3FREE, THYROIDAB in the last 72 hours. Anemia Panel: No results for input(s): VITAMINB12, FOLATE, FERRITIN, TIBC, IRON, RETICCTPCT in the last 72 hours. Urine analysis:    Component Value Date/Time   COLORURINE YELLOW 03/18/2016 2235   APPEARANCEUR HAZY (A) 03/18/2016 2235   LABSPEC 1.025 03/18/2016 2235   PHURINE 5.0 03/18/2016 2235   GLUCOSEU NEGATIVE 03/18/2016 2235   HGBUR SMALL (A) 03/18/2016 2235   BILIRUBINUR NEGATIVE 03/18/2016 2235   KETONESUR 20 (A) 03/18/2016 2235   PROTEINUR NEGATIVE 03/18/2016 2235   UROBILINOGEN 1 07/07/2013 1507   NITRITE NEGATIVE 03/18/2016 2235   LEUKOCYTESUR NEGATIVE 03/18/2016 2235   Sepsis Labs: _0 (procalcitonin:4,lacticidven:4) )No results found for this or any previous visit (from the past 240 hour(s)).    Radiological Exams on Admission: Dg Chest 2 View  Result Date: 03/18/2016 CLINICAL DATA:  Cough for 1 day EXAM: CHEST  2 VIEW COMPARISON:  05/11/2008 FINDINGS: Cardiac shadow is at the upper limits of normal in size. The lungs are well aerated bilaterally. No acute bony abnormality is seen. IMPRESSION: No active cardiopulmonary disease. Electronically Signed   By: Inez Catalina M.D.   On: 03/18/2016 22:01   Ct Head Wo Contrast  Result Date: 03/18/2016 CLINICAL DATA:  Tremors, altered mental status and gait abnormality EXAM: CT HEAD WITHOUT CONTRAST TECHNIQUE: Contiguous axial images were obtained from the base of the skull through the vertex without intravenous contrast. COMPARISON:  None. FINDINGS: Brain: No mass lesion, intraparenchymal hemorrhage or extra-axial collection. No evidence of acute cortical infarct. There is periventricular hypoattenuation compatible with chronic microvascular disease. Vascular: No hyperdense vessel or unexpected calcification. Skull: Normal visualized skull base, calvarium and extracranial soft tissues. Sinuses/Orbits: There are frothy secretions and mucosal thickening within the left frontal sinus. Normal orbits. IMPRESSION: 1. No acute intracranial abnormality. 2. Frothy secretions within the left frontal sinus may indicate acute sinusitis. Electronically Signed   By: Ulyses Jarred M.D.   On: 03/18/2016 22:27     EKG: Independently reviewed.  Not done in ED, will get one.   Assessment/Plan Principal Problem:   Hepatic encephalopathy (HCC) Active Problems:   Osteoporosis   Hypothyroidism   Diabetes mellitus without complication ([place])   Fall   Thrombocytopenia ([place])   Sepsis ([place])   Hepatic encephalopathy (HCC):Patient has history of liver cirrhosis, AMS, elevated ammonia level and asterixis, consistent with hepatic encephalopathy. A potential differential diagnosis is stroke, but the patient does not have unilateral weakness or focal neurological  findings on my physical examination, less likely to have stroke.  -will admit to SDU as inpt -restart lactulose 30 g tid -Frequent neuro check -Will start rocephin IV empirically -given 100 g of albumin -f/u blood culture  Liver cirrhosis: Patient is not drinking alcohol. She had hepatic panel on 01/31/16 which showed negative hepatitis C and B, but with positive hepatitis A antibody. Child-Pugh score 8-->class B. Now has hepatic encephalopathy. -will hepatic encephalopathy as above -Avoid the Tylenol  Fall: Most likely due to altered mental status. CT head is negative for acute intracranial abnormalities. -pt/ot  Hypothyroidism: Last TSH was 0.01 on 01/31/16  -Continue home Synthroid -Check TSH  DM-II: Last A1c 6.4 on 12/27/15, well  controled. Patient is taking metformin, glipizide and tradjenda at home. -SSI  Possible Sepsis:  Pt meets criteria for sepsis with elevated lactate, tachycardia, neutropenia. Her elevated lactate could also be partially from metformin use and dehydration. The source of infection includes possible SBP and acute sinusitis as shown by CT head. -will get Procalcitonin and trend lactic acid levels per sepsis protocol. -IVF: 2L of NS bolus in ED, followed by 100 cc/h  -on IV rocpehin   DVT ppx: SCD Code Status: Full code Family Communication:  Yes, patient's husband, son and brother    at bed side Disposition Plan:  Anticipate discharge back to previous home environment Consults called:  None dmission status:  Inpatient/tele  Date of Service 03/19/2016    Ivor Costa [facility] Pager [phone]  If 7PM-7AM, please contact night-coverage [email or web address] Password [facility] 03/19/2016, 12:47 AM

## 2016-03-18 NOTE — ED Triage Notes (Signed)
Per EMS:  Pt presents to ED for assessment after husband noted tremors, difficulty with gait, forgetfulness and confusion and multiple falls today.  Pt denies any injuries or pain, has no complaints at this time.  Husband states symptoms were noted at noon.  EDP at b edside at this time

## 2016-03-18 NOTE — ED Notes (Signed)
Attempted to get urine from pt.  Pt sts she does not need to urinate at this time.  Pt will now be transported to xray.

## 2016-03-18 NOTE — ED Provider Notes (Signed)
[facility] DEPT Provider Note   CSN: [id number] Arrival date & time: 03/18/16  2045     History   Chief Complaint Chief Complaint  Patient presents with  . Weakness  . Fall    HPI JIMMYE WISNIESKI is a 75 y.o. female.  Per EMS, husband noted that she was having some difficulty with weakness at about noon. Patient states that she got down on the floor and was unable to get herself back up. She denies any headache. She denies any difficulty with her arms. Speech has been normal. She's never had any problems like this before.   The history is provided by the patient and the EMS personnel.  Weakness   Fall     Past Medical History:  Diagnosis Date  . Breast cancer ([place]) 05/2006   Right breast cancer  . Cirrhosis ([place])   . Diabetes mellitus without complication ([place])   . Fibroids   . Osteopenia   . Thyroid disease     Patient Active Problem List   Diagnosis Date Noted  . Hyperlipemia 12/14/2014  . Hypothyroidism 05/18/2014  . Essential hypertension 05/18/2014  . Vitamin D deficiency 05/18/2014  . Medication management 05/18/2014  . 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 Osteoporosis   . Breast cancer ([place]) 09/30/2012  . Hot flashes 03/06/2011    Past Surgical History:  Procedure Laterality Date  . ABDOMINAL HYSTERECTOMY     TAH BSO  . BREAST LUMPECTOMY WITH AXILLARY LYMPH NODE BIOPSY Right 06/25/2006  . CHOLECYSTECTOMY    . TUBAL LIGATION  1978    OB History    No data available       Home Medications    Prior to Admission medications   Medication Sig Start Date End Date Taking? Authorizing Provider  alendronate (FOSAMAX) 70 MG tablet Take 1 tablet (70 mg total) by mouth once a week. 12/27/15   Vicie Mutters, PA-C  Blood Glucose Monitoring Suppl (ONE TOUCH ULTRA SYSTEM KIT) w/Device KIT Check blood sugar 1 time daily-DX-E11.29 08/31/15   Unk Pinto, MD  Cholecalciferol (VITAMIN D) 2000 UNITS tablet Take 4,000 Units by mouth 2 (two) times daily.     Historical Provider, MD  Flaxseed, Linseed, 1300 MG CAPS Take 1 capsule by mouth 2 (two) times daily.    Historical Provider, MD  glipiZIDE (GLUCOTROL) 5 MG tablet TAKE ONE TABLET BY MOUTH THREE TIMES DAILY BEFORE MEAL(S). 02/22/16   Unk Pinto, MD  glucose blood test strip Check blood sugar 1 time daily-DX-E11.29. 08/31/15   Unk Pinto, MD  Lancets [facility] ULTRASOFT) lancets Check blood sugar 1 time daily-DX-E11.29 08/31/15   Unk Pinto, MD  levothyroxine (SYNTHROID, LEVOTHROID) 150 MCG tablet TAKE ONE TO ONE & ONE-HALF TABLETS BY MOUTH  DAILY AS  DIRECTED. 12/13/15   Unk Pinto, MD  linagliptin (TRADJENTA) 5 MG TABS tablet Take 1 tablet (5 mg total) by mouth daily. 12/27/15   Vicie Mutters, PA-C  metFORMIN (GLUCOPHAGE-XR) 500 MG 24 hr tablet TAKE TWO TABLETS BY MOUTH TWICE DAILY WITH  A  MEAL. 01/29/16   Unk Pinto, MD  Omega-3 Fatty Acids (FISH OIL) 1200 MG CAPS Take 1 capsule by mouth 2 (two) times daily.    Historical Provider, MD  triamcinolone cream (KENALOG) 0.5 % Apply 1 application topically 2 (two) times daily. 12/27/15   Vicie Mutters, PA-C    Family History Family History  Problem Relation Age of Onset  . Cancer Mother     Brain cancer  . Cancer Father  Prostate cancer  . Diabetes Sister   . Hypertension Sister   . Liver disease Brother   . Diabetes Maternal Aunt   . Heart Problems Maternal Grandfather   . Cancer Paternal Grandfather     Unknown cancer  . Diabetes Sister     Social History Social History  Substance Use Topics  . Smoking status: Never Smoker  . Smokeless tobacco: Never Used  . Alcohol use No     Allergies   Metformin and related and Welchol [colesevelam hcl]   Review of Systems Review of Systems  Neurological: Positive for weakness.  All other systems reviewed and are negative.    Physical Exam Updated Vital Signs BP 127/69 (BP Location: Right Arm)   Pulse 110   Temp 99.9 F (37.7 C) (Oral)   Resp 22   Ht 5'  2" (1.575 m)   Wt 153 lb (69.4 kg)   SpO2 98%   BMI 27.98 kg/m   Physical Exam  Nursing note and vitals reviewed.  75 year old female, resting comfortably and in no acute distress. Vital signs are significant for mild tachycardia and mild tachypnea. Oxygen saturation is 98%, which is normal. Head is normocephalic and atraumatic. PERRLA, EOMI. Oropharynx is clear. Neck is nontender and supple without adenopathy or JVD. There are no carotid bruits. Back is nontender and there is no CVA tenderness. Lungs are clear without rales, wheezes, or rhonchi. Chest is nontender. Heart has regular rate and rhythm without murmur. Abdomen is soft, flat, nontender without masses or hepatosplenomegaly and peristalsis is normoactive. Extremities have no cyanosis or edema, full range of motion is present. Skin is warm and dry without rash. Neurologic: She is awake and alert and oriented to person and place. She knows it is December 25, but is slow to remember the year. Cranial nerves are intact. There is no pronator drift. There is weakness of both arms and both legs but right side is weaker than left. Right arm and leg strength are 3+/5, and left arm and leg strength are 4/5. No sensory deficits identified.  ED Treatments / Results  Labs (all labs ordered are listed, but only abnormal results are displayed) Labs Reviewed  PROTIME-INR - Abnormal; Notable for the following:       Result Value   Prothrombin Time 15.8 (*)    All other components within normal limits  CBC - Abnormal; Notable for the following:    WBC 3.1 (*)    RBC 3.29 (*)    Hemoglobin 11.6 (*)    HCT 34.0 (*)    MCV 103.3 (*)    MCH 35.3 (*)    Platelets 84 (*)    All other components within normal limits  DIFFERENTIAL - Abnormal; Notable for the following:    Lymphs Abs 0.2 (*)    All other components within normal limits  COMPREHENSIVE METABOLIC PANEL - Abnormal; Notable for the following:    Sodium 133 (*)    CO2 21 (*)     Glucose, Bld 147 (*)    Total Protein 6.2 (*)    Albumin 3.3 (*)    AST 60 (*)    All other components within normal limits  URINALYSIS, ROUTINE W REFLEX MICROSCOPIC - Abnormal; Notable for the following:    APPearance HAZY (*)    Hgb urine dipstick SMALL (*)    Ketones, ur 20 (*)    Bacteria, UA RARE (*)    Squamous Epithelial / LPF 6-30 (*)  All other components within normal limits  AMMONIA - Abnormal; Notable for the following:    Ammonia 86 (*)    All other components within normal limits  I-STAT CG4 LACTIC ACID, ED - Abnormal; Notable for the following:    Lactic Acid, Venous 3.89 (*)    All other components within normal limits  ETHANOL  APTT  RAPID URINE DRUG SCREEN, HOSP PERFORMED  I-STAT TROPOININ, ED  I-STAT CG4 LACTIC ACID, ED  I-STAT CG4 LACTIC ACID, ED    EKG  EKG Interpretation  Date/Time:  Monday March 18 2016 21:01:37 EST Ventricular Rate:  106 PR Interval:    QRS Duration: 120 QT Interval:  356 QTC Calculation: 473 R Axis:   -46 Text Interpretation:  Sinus tachycardia Probable left atrial enlargement Right bundle branch block Inferior infarct, old When compared with ECG of 03/24/2008, Right bundle branch block is now Present Confirmed by [facility]  MD, Wanya Bangura ([id number]) on 03/18/2016 9:07:35 PM       Radiology Dg Chest 2 View  Result Date: 03/18/2016 CLINICAL DATA:  Cough for 1 day EXAM: CHEST  2 VIEW COMPARISON:  05/11/2008 FINDINGS: Cardiac shadow is at the upper limits of normal in size. The lungs are well aerated bilaterally. No acute bony abnormality is seen. IMPRESSION: No active cardiopulmonary disease. Electronically Signed   By: Inez Catalina M.D.   On: 03/18/2016 22:01   Ct Head Wo Contrast  Result Date: 03/18/2016 CLINICAL DATA:  Tremors, altered mental status and gait abnormality EXAM: CT HEAD WITHOUT CONTRAST TECHNIQUE: Contiguous axial images were obtained from the base of the skull through the vertex without intravenous contrast.  COMPARISON:  None. FINDINGS: Brain: No mass lesion, intraparenchymal hemorrhage or extra-axial collection. No evidence of acute cortical infarct. There is periventricular hypoattenuation compatible with chronic microvascular disease. Vascular: No hyperdense vessel or unexpected calcification. Skull: Normal visualized skull base, calvarium and extracranial soft tissues. Sinuses/Orbits: There are frothy secretions and mucosal thickening within the left frontal sinus. Normal orbits. IMPRESSION: 1. No acute intracranial abnormality. 2. Frothy secretions within the left frontal sinus may indicate acute sinusitis. Electronically Signed   By: Ulyses Jarred M.D.   On: 03/18/2016 22:27    Procedures Procedures (including critical care time)  Medications Ordered in ED Medications  lactulose (CHRONULAC) 10 GM/15ML solution 30 g (not administered)  sodium chloride 0.9 % bolus 1,000 mL (1,000 mLs Intravenous New Bag/Given 03/18/16 2214)     Initial Impression / Assessment and Plan / ED Course  I have reviewed the triage vital signs and the nursing notes.  Pertinent labs & imaging results that were available during my care of the patient were reviewed by me and considered in my medical decision making (see chart for details).  Clinical Course    Generalized weakness with right-sided weaker than left. Generalized nature weakness would argue against a stroke but she started on stroke protocol. Since last seen normal is greater than 8 hours ago, so she does not qualify for code stroke status. She does have history of cirrhosis and ammonia level will be checked.  CT is unremarkable. Laboratory workup shows pancytopenia consistent with cirrhosis, and elevated ammonia level consistent with empathic encephalopathy. Lactic acid level is come back elevated. This is not secondary to sepsis. She is given 1 L of IV fluid and lactic acid level will be rechecked. Case is discussed with Dr. Blaine Hamper of [facility] who  agrees to admit the patient.  Final Clinical Impressions(s) / ED Diagnoses   Final  diagnoses:  Weakness  Hepatic encephalopathy (HCC)  Pancytopenia (HCC)  Elevated lactic acid level    New Prescriptions New Prescriptions   No medications on file     Delora Fuel, MD 16/10/96 0454

## 2016-03-18 NOTE — ED Notes (Signed)
Pt encouraged to urinate.  States she cannot.  Asked if she could see how she felt after her fluids.  Fluids started and bedside commode at bedside.

## 2016-03-19 ENCOUNTER — Encounter (HOSPITAL_COMMUNITY): Payer: Self-pay | Admitting: General Practice

## 2016-03-19 DIAGNOSIS — Z808 Family history of malignant neoplasm of other organs or systems: Secondary | ICD-10-CM | POA: Diagnosis not present

## 2016-03-19 DIAGNOSIS — K7290 Hepatic failure, unspecified without coma: Secondary | ICD-10-CM | POA: Diagnosis present

## 2016-03-19 DIAGNOSIS — K7469 Other cirrhosis of liver: Secondary | ICD-10-CM | POA: Diagnosis not present

## 2016-03-19 DIAGNOSIS — R296 Repeated falls: Secondary | ICD-10-CM | POA: Diagnosis present

## 2016-03-19 DIAGNOSIS — E876 Hypokalemia: Secondary | ICD-10-CM | POA: Diagnosis present

## 2016-03-19 DIAGNOSIS — M810 Age-related osteoporosis without current pathological fracture: Secondary | ICD-10-CM

## 2016-03-19 DIAGNOSIS — Z79810 Long term (current) use of selective estrogen receptor modulators (SERMs): Secondary | ICD-10-CM | POA: Diagnosis not present

## 2016-03-19 DIAGNOSIS — W19XXXA Unspecified fall, initial encounter: Secondary | ICD-10-CM | POA: Diagnosis not present

## 2016-03-19 DIAGNOSIS — C220 Liver cell carcinoma: Secondary | ICD-10-CM | POA: Diagnosis present

## 2016-03-19 DIAGNOSIS — R161 Splenomegaly, not elsewhere classified: Secondary | ICD-10-CM | POA: Diagnosis present

## 2016-03-19 DIAGNOSIS — C229 Malignant neoplasm of liver, not specified as primary or secondary: Secondary | ICD-10-CM | POA: Diagnosis not present

## 2016-03-19 DIAGNOSIS — K7460 Unspecified cirrhosis of liver: Secondary | ICD-10-CM | POA: Diagnosis present

## 2016-03-19 DIAGNOSIS — D6959 Other secondary thrombocytopenia: Secondary | ICD-10-CM | POA: Diagnosis present

## 2016-03-19 DIAGNOSIS — E119 Type 2 diabetes mellitus without complications: Secondary | ICD-10-CM | POA: Diagnosis present

## 2016-03-19 DIAGNOSIS — E872 Acidosis: Secondary | ICD-10-CM | POA: Diagnosis present

## 2016-03-19 DIAGNOSIS — I10 Essential (primary) hypertension: Secondary | ICD-10-CM | POA: Diagnosis present

## 2016-03-19 DIAGNOSIS — R000 Tachycardia, unspecified: Secondary | ICD-10-CM | POA: Diagnosis present

## 2016-03-19 DIAGNOSIS — Z853 Personal history of malignant neoplasm of breast: Secondary | ICD-10-CM | POA: Diagnosis not present

## 2016-03-19 DIAGNOSIS — K7581 Nonalcoholic steatohepatitis (NASH): Secondary | ICD-10-CM | POA: Diagnosis present

## 2016-03-19 DIAGNOSIS — E860 Dehydration: Secondary | ICD-10-CM | POA: Diagnosis present

## 2016-03-19 DIAGNOSIS — R160 Hepatomegaly, not elsewhere classified: Secondary | ICD-10-CM | POA: Diagnosis not present

## 2016-03-19 DIAGNOSIS — R7989 Other specified abnormal findings of blood chemistry: Secondary | ICD-10-CM | POA: Insufficient documentation

## 2016-03-19 DIAGNOSIS — Z79899 Other long term (current) drug therapy: Secondary | ICD-10-CM | POA: Diagnosis not present

## 2016-03-19 DIAGNOSIS — E039 Hypothyroidism, unspecified: Secondary | ICD-10-CM | POA: Diagnosis present

## 2016-03-19 DIAGNOSIS — D709 Neutropenia, unspecified: Secondary | ICD-10-CM | POA: Diagnosis present

## 2016-03-19 DIAGNOSIS — A419 Sepsis, unspecified organism: Secondary | ICD-10-CM | POA: Diagnosis present

## 2016-03-19 DIAGNOSIS — D61818 Other pancytopenia: Secondary | ICD-10-CM | POA: Insufficient documentation

## 2016-03-19 DIAGNOSIS — J019 Acute sinusitis, unspecified: Secondary | ICD-10-CM | POA: Diagnosis present

## 2016-03-19 DIAGNOSIS — Z888 Allergy status to other drugs, medicaments and biological substances status: Secondary | ICD-10-CM | POA: Diagnosis not present

## 2016-03-19 LAB — COMPREHENSIVE METABOLIC PANEL
ALT: 40 U/L (ref 14–54)
ANION GAP: 10 (ref 5–15)
AST: 53 U/L — ABNORMAL HIGH (ref 15–41)
Albumin: 3.5 g/dL (ref 3.5–5.0)
Alkaline Phosphatase: 57 U/L (ref 38–126)
BUN: 8 mg/dL (ref 6–20)
CHLORIDE: 109 mmol/L (ref 101–111)
CO2: 16 mmol/L — ABNORMAL LOW (ref 22–32)
Calcium: 8.7 mg/dL — ABNORMAL LOW (ref 8.9–10.3)
Creatinine, Ser: 0.72 mg/dL (ref 0.44–1.00)
Glucose, Bld: 190 mg/dL — ABNORMAL HIGH (ref 65–99)
POTASSIUM: 3.7 mmol/L (ref 3.5–5.1)
Sodium: 135 mmol/L (ref 135–145)
Total Bilirubin: 1.1 mg/dL (ref 0.3–1.2)
Total Protein: 6.1 g/dL — ABNORMAL LOW (ref 6.5–8.1)

## 2016-03-19 LAB — CBC
HEMATOCRIT: 31.3 % — AB (ref 36.0–46.0)
Hemoglobin: 10.6 g/dL — ABNORMAL LOW (ref 12.0–15.0)
MCH: 35.3 pg — ABNORMAL HIGH (ref 26.0–34.0)
MCHC: 33.9 g/dL (ref 30.0–36.0)
MCV: 104.3 fL — AB (ref 78.0–100.0)
Platelets: 75 10*3/uL — ABNORMAL LOW (ref 150–400)
RBC: 3 MIL/uL — AB (ref 3.87–5.11)
RDW: 13.9 % (ref 11.5–15.5)
WBC: 3 10*3/uL — AB (ref 4.0–10.5)

## 2016-03-19 LAB — LACTIC ACID, PLASMA
LACTIC ACID, VENOUS: 2.1 mmol/L — AB (ref 0.5–1.9)
Lactic Acid, Venous: 2.1 mmol/L (ref 0.5–1.9)

## 2016-03-19 LAB — I-STAT CG4 LACTIC ACID, ED: Lactic Acid, Venous: 2.77 mmol/L (ref 0.5–1.9)

## 2016-03-19 LAB — GLUCOSE, CAPILLARY
GLUCOSE-CAPILLARY: 158 mg/dL — AB (ref 65–99)
GLUCOSE-CAPILLARY: 117 mg/dL — AB (ref 65–99)
GLUCOSE-CAPILLARY: 106 mg/dL — AB (ref 65–99)
Glucose-Capillary: 130 mg/dL — ABNORMAL HIGH (ref 65–99)

## 2016-03-19 LAB — PROCALCITONIN: Procalcitonin: 1.46 ng/mL

## 2016-03-19 LAB — TSH: TSH: 0.026 u[IU]/mL — ABNORMAL LOW (ref 0.350–4.500)

## 2016-03-19 MED ORDER — DM-GUAIFENESIN ER 30-600 MG PO TB12
1.0000 | ORAL_TABLET | Freq: Two times a day (BID) | ORAL | Status: DC | PRN
  Administered 2016-03-19 – 2016-03-21 (×3): 1 via ORAL
  Filled 2016-03-19 (×4): qty 1

## 2016-03-19 MED ORDER — SODIUM CHLORIDE 0.9% FLUSH
3.0000 mL | Freq: Two times a day (BID) | INTRAVENOUS | Status: DC
  Administered 2016-03-19 – 2016-03-23 (×6): 3 mL via INTRAVENOUS

## 2016-03-19 MED ORDER — DEXTROSE 5 % IV SOLN
1.0000 g | Freq: Every day | INTRAVENOUS | Status: DC
  Administered 2016-03-19 – 2016-03-21 (×3): 1 g via INTRAVENOUS
  Filled 2016-03-19 (×5): qty 10

## 2016-03-19 MED ORDER — LEVOTHYROXINE SODIUM 75 MCG PO TABS
225.0000 ug | ORAL_TABLET | Freq: Every day | ORAL | Status: DC
  Administered 2016-03-19: 225 ug via ORAL
  Filled 2016-03-19: qty 3

## 2016-03-19 MED ORDER — ALBUMIN HUMAN 25 % IV SOLN
100.0000 g | Freq: Once | INTRAVENOUS | Status: AC
  Administered 2016-03-19: 100 g via INTRAVENOUS
  Filled 2016-03-19: qty 400

## 2016-03-19 MED ORDER — OMEGA-3-ACID ETHYL ESTERS 1 G PO CAPS
1.0000 g | ORAL_CAPSULE | Freq: Every day | ORAL | Status: DC
  Administered 2016-03-19 – 2016-03-23 (×5): 1 g via ORAL
  Filled 2016-03-19 (×5): qty 1

## 2016-03-19 MED ORDER — LEVOTHYROXINE SODIUM 100 MCG PO TABS
200.0000 ug | ORAL_TABLET | Freq: Every day | ORAL | Status: DC
  Administered 2016-03-20 – 2016-03-23 (×4): 200 ug via ORAL
  Filled 2016-03-19 (×6): qty 2

## 2016-03-19 MED ORDER — SODIUM CHLORIDE 0.9 % IV SOLN
INTRAVENOUS | Status: DC
  Administered 2016-03-19 – 2016-03-21 (×2): via INTRAVENOUS

## 2016-03-19 MED ORDER — INSULIN ASPART 100 UNIT/ML ~~LOC~~ SOLN
0.0000 [IU] | Freq: Three times a day (TID) | SUBCUTANEOUS | Status: DC
  Administered 2016-03-19 – 2016-03-20 (×3): 2 [IU] via SUBCUTANEOUS
  Administered 2016-03-21: 1 [IU] via SUBCUTANEOUS
  Administered 2016-03-21: 3 [IU] via SUBCUTANEOUS
  Administered 2016-03-21: 2 [IU] via SUBCUTANEOUS
  Administered 2016-03-22 (×2): 1 [IU] via SUBCUTANEOUS
  Administered 2016-03-22: 2 [IU] via SUBCUTANEOUS

## 2016-03-19 MED ORDER — SODIUM CHLORIDE 0.9 % IV BOLUS (SEPSIS)
1000.0000 mL | Freq: Once | INTRAVENOUS | Status: AC
  Administered 2016-03-19: 1000 mL via INTRAVENOUS

## 2016-03-19 MED ORDER — ONDANSETRON HCL 4 MG/2ML IJ SOLN: 4.0000 mg | Freq: Three times a day (TID) | INTRAMUSCULAR | Status: DC | PRN

## 2016-03-19 MED ORDER — VITAMIN D 1000 UNITS PO TABS
4000.0000 [IU] | ORAL_TABLET | Freq: Two times a day (BID) | ORAL | Status: DC
  Administered 2016-03-19 – 2016-03-23 (×8): 4000 [IU] via ORAL
  Filled 2016-03-19 (×8): qty 4

## 2016-03-19 MED ORDER — LACTULOSE 10 GM/15ML PO SOLN
30.0000 g | Freq: Three times a day (TID) | ORAL | Status: DC
  Administered 2016-03-20 (×2): 30 g via ORAL
  Filled 2016-03-19 (×2): qty 45

## 2016-03-19 MED ORDER — LEVOTHYROXINE SODIUM 112 MCG PO TABS: 224.0000 ug | ORAL_TABLET | Freq: Every day | ORAL | Status: DC

## 2016-03-19 MED ORDER — TRIAMCINOLONE ACETONIDE 0.5 % EX CREA
1.0000 "application " | TOPICAL_CREAM | Freq: Two times a day (BID) | CUTANEOUS | Status: DC
  Administered 2016-03-20 – 2016-03-23 (×6): 1 via TOPICAL
  Filled 2016-03-19: qty 15

## 2016-03-19 MED ORDER — ZOLPIDEM TARTRATE 5 MG PO TABS: 5.0000 mg | ORAL_TABLET | Freq: Every evening | ORAL | Status: DC | PRN

## 2016-03-19 MED ORDER — INSULIN ASPART 100 UNIT/ML ~~LOC~~ SOLN: 0.0000 [IU] | Freq: Every day | SUBCUTANEOUS | Status: DC

## 2016-03-19 MED ORDER — FLAXSEED (LINSEED) 1300 MG PO CAPS: 1.0000 | ORAL_CAPSULE | Freq: Two times a day (BID) | ORAL | Status: DC

## 2016-03-19 NOTE — ED Notes (Signed)
Attempted to give report. Secretary advised she would have nurse call back for report.

## 2016-03-19 NOTE — Evaluation (Signed)
Physical Therapy Evaluation Patient Details Name: Beverly Davis MRN: [id number] DOB: 08/29/1940 Today's Date: 03/19/2016   History of Present Illness   Beverly Davis is a 75 y.o. female with medical history significant of liver cirrhosis, diabetes mellitus, hypothyroidism, breast cancer (s/p of right lumpectomy,s/p of radiation therapy, no chemotherapy 2008), osteoporosis, chronic thrombocytopenia, who presents with altered mental status and multiple falls.  Clinical Impression  Pt admitted with/for AMS and falls.  Pt is presently at a min assist level, slow to follow direction and very distractable..  Pt currently limited functionally due to the problems listed. ( See problems list.)   Pt will benefit from PT to maximize function and safety in order to get ready for next venue listed below.     Follow Up Recommendations SNF;Other (comment) (unless she clears mentally)    Equipment Recommendations   (TBA, family has no DME)    Recommendations for Other Services       Precautions / Restrictions Precautions Precautions: Fall Restrictions Weight Bearing Restrictions: No                   Mobility  Bed Mobility Overal bed mobility: Needs Assistance Bed Mobility: Supine to Sit;Sit to Supine     Supine to sit: Min assist Sit to supine: Min guard   General bed mobility comments: cues for direction, sequencing, truncal assist up.  Transfers Overall transfer level: Needs assistance   Transfers: Sit to/from Stand Sit to Stand: Min assist            Ambulation/Gait Ambulation/Gait assistance: Min assist;Min guard Ambulation Distance (Feet): 170 Feet Assistive device: Rolling walker (2 wheeled) Gait Pattern/deviations: Step-through pattern Gait velocity: slower Gait velocity interpretation: Below normal speed for age/gender General Gait Details: wandering, distracted, but gait generally steady.  pt running into obstacles on the R frequently.  Stairs             Wheelchair Mobility    Modified Rankin (Stroke Patients Only)       Balance Overall balance assessment: Needs assistance Sitting-balance support: Feet supported;Single extremity supported Sitting balance-Leahy Scale: Poor Sitting balance - Comments: listing posteriorly initially,  improved after gait     Standing balance-Leahy Scale: Poor Standing balance comment: reliant on support                             Pertinent Vitals/Pain Pain Assessment: No/denies pain    Home Living Family/patient expects to be discharged to:: Private residence Living Arrangements: Spouse/significant other Available Help at Discharge: Family;Available 24 hours/day;Available PRN/intermittently Type of Home: House Home Access: Stairs to enter Entrance Stairs-Rails: Psychiatric nurse of Steps: 2-4 Home Layout: One level Home Equipment: None      Prior Function Level of Independence: Independent         Comments: keeps grandchildren     Hand Dominance        Extremity/Trunk Assessment   Upper Extremity Assessment Upper Extremity Assessment: Defer to OT evaluation    Lower Extremity Assessment Lower Extremity Assessment: Overall WFL for tasks assessed;Generalized weakness (proximal weakness)       Communication   Communication: No difficulties  Cognition Arousal/Alertness: Awake/alert Behavior During Therapy: Restless Overall Cognitive Status: Impaired/Different from baseline                 General Comments: distractable and slow to process/follow direction    General Comments      Exercises  Assessment/Plan    PT Assessment Patient needs continued PT services  PT Problem List Decreased range of motion;Decreased activity tolerance;Decreased balance;Decreased mobility;Decreased cognition;Decreased knowledge of use of DME;Pain          PT Treatment Interventions Gait training;Stair training;Functional mobility  training;Therapeutic activities;Therapeutic exercise;Balance training;Patient/family education    PT Goals (Current goals can be found in the Care Plan section)  Acute Rehab PT Goals Patient Stated Goal: pt unable to participate PT Goal Formulation: With patient/family Time For Goal Achievement: 03/26/16 Potential to Achieve Goals: Good    Frequency Min 3X/week   Barriers to discharge        Co-evaluation               End of Session   Activity Tolerance: Patient tolerated treatment well Patient left: in bed;with call bell/phone within reach;with family/visitor present;with bed alarm set Nurse Communication: Mobility status         Time: 0045-9977 PT Time Calculation (min) (ACUTE ONLY): 29 min   Charges:   PT Evaluation $PT Eval Moderate Complexity: 1 Procedure PT Treatments $Gait Training: 8-22 mins   PT G CodesTessie Fass Jerred Zaremba 03/19/2016, 12:49 PM 03/19/2016  Donnella Sham, PT [phone]  (pager)

## 2016-03-19 NOTE — Progress Notes (Signed)
Called to get report from [place] pt is not being worked up for stroke and does not qualify for telemetry asked Nicki Reaper to get with Dr. Blaine Hamper and DC the telly order prior to transfer.

## 2016-03-19 NOTE — Progress Notes (Signed)
[facility]                                                                              Patient Demographics  Beverly Davis, is a 75 y.o. female, DOB - 1940/05/07, IWO:[id number]  Admit date - 03/18/2016   Admitting Physician Ivor Costa, MD  Outpatient Primary MD for the patient is Alesia Richards, MD  Outpatient specialists:   LOS - 0  days    Chief Complaint  Patient presents with  . Weakness  . Fall       Brief summary   Patient is a 75 year old female with liver cirrhosis, diabetes mellitus, hypothyroidism, breast cancer (s/p of right lumpectomy,s/p of radiation therapy, no chemotherapy 2008), osteoporosis, chronic thrombocytopenia, who presents with altered mental status and multiple falls. She was found to have ammonia level of 86, abnormal liver function with AST 60, ALT 44, total bilirubin 1.6.  Assessment & Plan    Hepatic encephalopathy ([place]):  - Underlying history of liver cirrhosis, AMS, elevated ammonia level and asterixis, consistent with hepatic encephalopathy. No focal neurological deficits  - Placed on lactulose lactulose 30 g tid - Continue rocephin IV empirically, albumin - f/u blood culture  Liver cirrhosis: Patient is not drinking alcohol. She had hepatic panel on 01/31/16 which showed negative hepatitis C and B, but with positive hepatitis A antibody. Child-Pugh score 8-->class B. Now has hepatic encephalopathy. -Avoid Tylenol  Fall: Most likely due to altered mental status. CT head is negative for acute intracranial abnormalities. -PT OT evaluation  Hypothyroidism: Last TSH was 0.01 on 01/31/16  -TSH 0.026, too suppressed, decrease Synthroid dose, 223mg-> 203m daily   DM-II: Last A1c 6.4 on 12/27/15, well controled. Patient is taking metformin, glipizide and tradjenda at home. -Placed on sliding scale insulin  Possible Sepsis:  Pt meets criteria for sepsis with elevated lactate, tachycardia, neutropenia. Her elevated  lactate could also be partially from metformin use and dehydration. The source of infection includes possible SBP and acute sinusitis as shown by CT head. -Lactic acid improving 2.1, pro calcitonin 1.46, continue gentle hydration, IV Rocephin    Code Status:Full code  DVT Prophylaxis:   SCD's Family Communication: No family member at the bedside   Disposition Plan:   Time Spent in minutes   25 minutes  Procedures:  None  Consultants:   None  Antimicrobials:   IV Rocephin 12/26   Medications  Scheduled Meds: . cefTRIAXone (ROCEPHIN)  IV  1 g Intravenous QHS  . cholecalciferol  4,000 Units Oral BID  . insulin aspart  0-5 Units Subcutaneous QHS  . insulin aspart  0-9 Units Subcutaneous TID WC  . [START ON 03/20/2016] lactulose  30 g Oral TID  . levothyroxine  225 mcg Oral QAC breakfast  . omega-3 acid ethyl esters  1 g Oral Daily  . sodium chloride flush  3 mL Intravenous Q12H  . triamcinolone cream  1 application Topical BID   Continuous Infusions: . sodium chloride 100 mL/hr at 03/19/16 0336   PRN Meds:.dextromethorphan-guaiFENesin, ondansetron, zolpidem   Antibiotics   Anti-infectives    Start     Dose/Rate Route Frequency Ordered Stop  03/19/16 0300  cefTRIAXone (ROCEPHIN) 1 g in dextrose 5 % 50 mL IVPB     1 g 100 mL/hr over 30 Minutes Intravenous Daily at bedtime 03/19/16 0019          Subjective:   Beverly Davis was seen and examined today.  Somewhat sleepy, confused, oriented to name and self. Denies any pain. No fevers or chills.  Patient denies dizziness, chest pain, shortness of breath.,   Objective:   Vitals:   03/19/16 0124 03/19/16 0233 03/19/16 0526 03/19/16 0915  BP: 114/62 98/69 (!) 94/45 (!) 106/47  Pulse: 102 (!) 102 (!) 104 85  Resp: 24 (!) 22 18 17   Temp:  99.2 F (37.3 C) 99.4 F (37.4 C) 98.9 F (37.2 C)  TempSrc:  Oral Oral Oral  SpO2: 94% 97% 99% 98%  Weight:  65.2 kg (143 lb 12.8 oz)    Height:        Intake/Output  Summary (Last 24 hours) at 03/19/16 1201 Last data filed at 03/19/16 1045  Gross per 24 hour  Intake               30 ml  Output              426 ml  Net             -396 ml     Wt Readings from Last 3 Encounters:  03/19/16 65.2 kg (143 lb 12.8 oz)  12/27/15 65.8 kg (145 lb)  09/05/15 67.8 kg (149 lb 6.4 oz)     Exam  General: Sleepy but easily arousable, x1Confused   HEENT:    Neck:   Cardiovascular: S1 S2 auscultated, no rubs, murmurs or gallops. Regular rate and rhythm.  Respiratory: Clear to auscultation bilaterally, no wheezing, rales or rhonchi  Gastrointestinal: Soft, nontender, nondistended, + bowel sounds  Ext: no cyanosis clubbing or edema, Asterixis   Neuro: AAOx3, Cr N's II- XII. Strength 5/5 upper and lower extremities bilaterally  Skin: No rashes  Psych: confused, sleepy but arousable, oriented  x 1   Data Reviewed:  I have personally reviewed following labs and imaging studies  Micro Results No results found for this or any previous visit (from the past 240 hour(s)).  Radiology Reports Dg Chest 2 View  Result Date: 03/18/2016 CLINICAL DATA:  Cough for 1 day EXAM: CHEST  2 VIEW COMPARISON:  05/11/2008 FINDINGS: Cardiac shadow is at the upper limits of normal in size. The lungs are well aerated bilaterally. No acute bony abnormality is seen. IMPRESSION: No active cardiopulmonary disease. Electronically Signed   By: Inez Catalina M.D.   On: 03/18/2016 22:01   Ct Head Wo Contrast  Result Date: 03/18/2016 CLINICAL DATA:  Tremors, altered mental status and gait abnormality EXAM: CT HEAD WITHOUT CONTRAST TECHNIQUE: Contiguous axial images were obtained from the base of the skull through the vertex without intravenous contrast. COMPARISON:  None. FINDINGS: Brain: No mass lesion, intraparenchymal hemorrhage or extra-axial collection. No evidence of acute cortical infarct. There is periventricular hypoattenuation compatible with chronic microvascular disease.  Vascular: No hyperdense vessel or unexpected calcification. Skull: Normal visualized skull base, calvarium and extracranial soft tissues. Sinuses/Orbits: There are frothy secretions and mucosal thickening within the left frontal sinus. Normal orbits. IMPRESSION: 1. No acute intracranial abnormality. 2. Frothy secretions within the left frontal sinus may indicate acute sinusitis. Electronically Signed   By: Ulyses Jarred M.D.   On: 03/18/2016 22:27    Lab Data:  CBC:  Recent Labs  Lab 03/18/16 2114 03/19/16 0523  WBC 3.1* 3.0*  NEUTROABS 2.7  --   HGB 11.6* 10.6*  HCT 34.0* 31.3*  MCV 103.3* 104.3*  PLT 84* 75*   Basic Metabolic Panel:  Recent Labs Lab 03/18/16 2114 03/19/16 0523  NA 133* 135  K 3.8 3.7  CL 107 109  CO2 21* 16*  GLUCOSE 147* 190*  BUN 9 8  CREATININE 0.70 0.72  CALCIUM 9.0 8.7*   GFR: Estimated Creatinine Clearance: 54.6 mL/min (by C-G formula based on SCr of 0.72 mg/dL). Liver Function Tests:  Recent Labs Lab 03/18/16 2114 03/19/16 0523  AST 60* 53*  ALT 44 40  ALKPHOS 74 57  BILITOT 1.2 1.1  PROT 6.2* 6.1*  ALBUMIN 3.3* 3.5   No results for input(s): LIPASE, AMYLASE in the last 168 hours.  Recent Labs Lab 03/18/16 2114  AMMONIA 86*   Coagulation Profile:  Recent Labs Lab 03/18/16 2114  INR 1.26   Cardiac Enzymes: No results for input(s): CKTOTAL, CKMB, CKMBINDEX, TROPONINI in the last 168 hours. BNP (last 3 results) No results for input(s): PROBNP in the last 8760 hours. HbA1C: No results for input(s): HGBA1C in the last 72 hours. CBG:  Recent Labs Lab 03/19/16 0734 03/19/16 1140  GLUCAP 158* 117*   Lipid Profile: No results for input(s): CHOL, HDL, LDLCALC, TRIG, CHOLHDL, LDLDIRECT in the last 72 hours. Thyroid Function Tests:  Recent Labs  03/19/16 0523  TSH 0.026*   Anemia Panel: No results for input(s): VITAMINB12, FOLATE, FERRITIN, TIBC, IRON, RETICCTPCT in the last 72 hours. Urine analysis:    Component  Value Date/Time   COLORURINE YELLOW 03/18/2016 2235   APPEARANCEUR HAZY (A) 03/18/2016 2235   LABSPEC 1.025 03/18/2016 2235   PHURINE 5.0 03/18/2016 2235   GLUCOSEU NEGATIVE 03/18/2016 2235   HGBUR SMALL (A) 03/18/2016 2235   BILIRUBINUR NEGATIVE 03/18/2016 2235   KETONESUR 20 (A) 03/18/2016 2235   PROTEINUR NEGATIVE 03/18/2016 2235   UROBILINOGEN 1 07/07/2013 1507   NITRITE NEGATIVE 03/18/2016 2235   LEUKOCYTESUR NEGATIVE 03/18/2016 2235     Beverly Davis M.D. [facility] 03/19/2016, 12:01 PM  Pager: [phone] Between 7am to 7pm - call Pager - [phone]  After 7pm go to [email or web address] - password [email or web address]  Call night coverage person covering after 7pm

## 2016-03-19 NOTE — Progress Notes (Signed)
Lactic acid greater than 2, added a repeat lactic acid per orders.  Eleanora Neighbor, RN

## 2016-03-19 NOTE — Progress Notes (Addendum)
CRITICAL VALUE STICKER  CRITICAL VALUE: Lactic Acid 2.1  RECEIVER (on-site recipient of call):  DATE & TIME NOTIFIED: 03/19/16 0835  MESSENGER (representative from lab):  MD NOTIFIED: Doctor R. Rai   TIME OF NOTIFICATION:0837  RESPONSE: [phone]

## 2016-03-19 NOTE — Progress Notes (Signed)
New Admission Note:   Arrival Method: By bed from the ED around 0230 Mental Orientation: Alert and oriented Telemetry: Box 03, CCMD notified Assessment: Completed Skin: Completed refer to flowsheets Iv: Left forearm, NSL at 100 Pain: Denies Tubes: None Safety Measures: Safety Fall Prevention Plan was given, discussed  Admission: Completed [address] Orientation: Patient has been orientated to the room, unit and the staff. Family: Son at bedside  Orders have been reviewed and implemented. Will continue to monitor the patient. Call light has been placed within reach and bed alarm has been activated.   Perry Mount, RN  Phone Number: [phone]

## 2016-03-19 NOTE — Progress Notes (Signed)
PHARMACIST - PHYSICIAN ORDER COMMUNICATION  CONCERNING: P&T Medication Policy on Herbal Medications  DESCRIPTION:  This patient's order for:  Flaxseed Oil  has been noted.  This product(s) is classified as an "herbal" or natural product. Due to a lack of definitive safety studies or FDA approval, nonstandard manufacturing practices, plus the potential risk of unknown drug-drug interactions while on inpatient medications, the Pharmacy and Therapeutics Committee does not permit the use of "herbal" or natural products of this type within [facility].   ACTION TAKEN: The pharmacy department is unable to verify this order at this time and your patient has been informed of this safety policy. Please reevaluate patient's clinical condition at discharge and address if the herbal or natural product(s) should be resumed at that time.

## 2016-03-20 LAB — GLUCOSE, CAPILLARY
Glucose-Capillary: 186 mg/dL — ABNORMAL HIGH (ref 65–99)
Glucose-Capillary: 185 mg/dL — ABNORMAL HIGH (ref 65–99)
Glucose-Capillary: 162 mg/dL — ABNORMAL HIGH (ref 65–99)
Glucose-Capillary: 139 mg/dL — ABNORMAL HIGH (ref 65–99)
Glucose-Capillary: 148 mg/dL — ABNORMAL HIGH (ref 65–99)

## 2016-03-20 MED ORDER — LACTULOSE 10 GM/15ML PO SOLN
30.0000 g | Freq: Two times a day (BID) | ORAL | Status: DC
  Administered 2016-03-20: 30 g via ORAL
  Filled 2016-03-20: qty 45

## 2016-03-20 MED ORDER — FLUTICASONE PROPIONATE 50 MCG/ACT NA SUSP
2.0000 | Freq: Every day | NASAL | Status: DC
  Administered 2016-03-20 – 2016-03-23 (×3): 2 via NASAL
  Filled 2016-03-20: qty 16

## 2016-03-20 MED ORDER — HYDROCORTISONE 2.5 % RE CREA
TOPICAL_CREAM | Freq: Two times a day (BID) | RECTAL | Status: DC
  Administered 2016-03-20 – 2016-03-21 (×3): via RECTAL
  Filled 2016-03-20: qty 28.35

## 2016-03-20 NOTE — Progress Notes (Signed)
[facility]                                                                              Patient Demographics  Beverly Davis, is a 75 y.o. female, DOB - [birth year], LMB:[id number]  Admit date - 03/18/2016   Admitting Physician Ivor Costa, MD  Outpatient Primary MD for the patient is Alesia Richards, MD  Outpatient specialists:   LOS - 1  days    Chief Complaint  Patient presents with  . Weakness  . Fall       Brief summary   Patient is a 75 year old female with liver cirrhosis, diabetes mellitus, hypothyroidism, breast cancer (s/p of right lumpectomy,s/p of radiation therapy, no chemotherapy 2008), osteoporosis, chronic thrombocytopenia, who presents with altered mental status and multiple falls. She was found to have ammonia level of 86, abnormal liver function with AST 60, ALT 44, total bilirubin 1.6.  Assessment & Plan    Hepatic encephalopathy ([place]):  - Underlying history of liver cirrhosis, AMS, elevated ammonia level and asterixis, consistent with hepatic encephalopathy. No focal neurological deficits  - she is started on  lactulose , iv rocephin empirically, albumin,  blood culture in process -patient she report she is followed  By GI Dr Benson [place], she had egd and colonoscopy about 44month ago which was unremarkable. She report he has cirrhosis which is hereditary -patient is having watery stool constantly, denies abdominal pain, she is less confused, she is oriented to place and person, slight confusion about time but able to self correct, decrease lactulose dose  Liver cirrhosis: Patient is not drinking alcohol. She had hepatic panel on 01/31/16 which showed negative hepatitis C and B, but with positive hepatitis A antibody. Child-Pugh score 8-->class B. Now has hepatic encephalopathy. -Avoid Tylenol -will get liver ultrasound. Baseline cea  Fall: Most likely due to altered mental status. CT head is negative for acute intracranial  abnormalities. -PT OT evaluation, likely need home health   Thrombocytopenia: plt 116 in 12/2015, plt 84 on admission, today is 75 No hypoxia, no edema, thrombocytopenia likely due to cirrhosis.  She does not appear to have any significant infection, will check b12/folate May need to refer to hematology if thrombocytopenia persist  Leukopenia: appear to be chronic  Hypothyroidism: Last TSH was 0.01 on 01/31/16  -TSH 0.026, too suppressed, decrease Synthroid dose, 2259m-> 20046mdaily   DM-II: Last A1c 6.4 on 12/27/15, well controled. Patient is taking metformin, glipizide and tradjenda at home. -Placed on sliding scale insulin  Possible Sepsis?:   she did have elevated lactate, tachycardia, neutropenia on admission. Her elevated lactate could also be partially from metformin use , dehydration and cirrhosis  The source of infection includes possible SBP and acute sinusitis as shown by CT head. -Lactic acid improving 2.1, pro calcitonin 1.46, continue gentle hydration, IV Rocephin   -add flonase, getting abdominal us [place] Status:Full code  DVT Prophylaxis:   SCD's Family Communication: husband and daughter in law at bedside   Disposition Plan:   Time Spent in minutes   35 minutes  Procedures:  None  Consultants:   None  Antimicrobials:   IV Rocephin 12/26  Medications  Scheduled Meds: . cefTRIAXone (ROCEPHIN)  IV  1 g Intravenous QHS  . cholecalciferol  4,000 Units Oral BID  . hydrocortisone   Rectal BID  . insulin aspart  0-5 Units Subcutaneous QHS  . insulin aspart  0-9 Units Subcutaneous TID WC  . lactulose  30 g Oral BID  . levothyroxine  200 mcg Oral QAC breakfast  . omega-3 acid ethyl esters  1 g Oral Daily  . sodium chloride flush  3 mL Intravenous Q12H  . triamcinolone cream  1 application Topical BID   Continuous Infusions: . sodium chloride 100 mL/hr at 03/19/16 0336   PRN Meds:.dextromethorphan-guaiFENesin, ondansetron,  zolpidem   Antibiotics   Anti-infectives    Start     Dose/Rate Route Frequency Ordered Stop   03/19/16 0300  cefTRIAXone (ROCEPHIN) 1 g in dextrose 5 % 50 mL IVPB     1 g 100 mL/hr over 30 Minutes Intravenous Daily at bedtime 03/19/16 0019          Subjective:   Aleenah Homen was seen and examined today with family and RN in room. patient is having watery stool constantly, denies abdominal pain,  she is less confused, she is oriented to place and person, slight confusion about time but able to self correct. She has intermittent dry cough during encounter She also appear to have distended abdomen which by family this seems to be chronic No lower extremity edema on exam  Objective:   Vitals:   03/19/16 2115 03/20/16 0521 03/20/16 0958 03/20/16 1658  BP: (!) 109/51 (!) 90/39 (!) 111/56 (!) 115/47  Pulse: 95 85 79 83  Resp: 16 18 17 18   Temp: 98.2 F (36.8 C) 98.9 F (37.2 C) 98.8 F (37.1 C) 99.4 F (37.4 C)  TempSrc: Oral  Oral Oral  SpO2: 95% 99% 98% 96%  Weight:      Height:        Intake/Output Summary (Last 24 hours) at 03/20/16 1703 Last data filed at 03/20/16 1613  Gross per 24 hour  Intake             1870 ml  Output               50 ml  Net             1820 ml     Wt Readings from Last 3 Encounters:  03/19/16 65.2 kg (143 lb 12.8 oz)  12/27/15 65.8 kg (145 lb)  09/05/15 67.8 kg (149 lb 6.4 oz)     Exam  General:  Much more alert, less confused, she is oriented to place and person, slight confusion about time but able to self correct.  HEENT:  unremarkable  Cardiovascular: S1 S2 auscultated, no rubs, murmurs or gallops. Regular rate and rhythm.  Respiratory: Clear to auscultation bilaterally, no wheezing, rales or rhonchi  Gastrointestinal: protuberant , Soft, nontender, + bowel sounds  Ext: no cyanosis clubbing or edema, Asterixis   Neuro: less confused. Strength 5/5 upper and lower extremities bilaterally  Skin: No rashes  Psych:  pleasant    Data Reviewed:  I have personally reviewed following labs and imaging studies  Micro Results Recent Results (from the past 240 hour(s))  Culture, blood (x 2)     Status: None (Preliminary result)   Collection Time: 03/19/16 12:55 AM  Result Value Ref Range Status   Specimen Description BLOOD RIGHT ARM  Final   Special Requests BOTTLES DRAWN AEROBIC AND ANAEROBIC 10ML  Final  Culture NO GROWTH 1 DAY  Final   Report Status PENDING  Incomplete  Culture, blood (x 2)     Status: None (Preliminary result)   Collection Time: 03/19/16  1:05 AM  Result Value Ref Range Status   Specimen Description BLOOD RIGHT FOREARM  Final   Special Requests BOTTLES DRAWN AEROBIC AND ANAEROBIC 5ML  Final   Culture NO GROWTH 1 DAY  Final   Report Status PENDING  Incomplete    Radiology Reports Dg Chest 2 View  Result Date: 03/18/2016 CLINICAL DATA:  Cough for 1 day EXAM: CHEST  2 VIEW COMPARISON:  05/11/2008 FINDINGS: Cardiac shadow is at the upper limits of normal in size. The lungs are well aerated bilaterally. No acute bony abnormality is seen. IMPRESSION: No active cardiopulmonary disease. Electronically Signed   By: Inez Catalina M.D.   On: 03/18/2016 22:01   Ct Head Wo Contrast  Result Date: 03/18/2016 CLINICAL DATA:  Tremors, altered mental status and gait abnormality EXAM: CT HEAD WITHOUT CONTRAST TECHNIQUE: Contiguous axial images were obtained from the base of the skull through the vertex without intravenous contrast. COMPARISON:  None. FINDINGS: Brain: No mass lesion, intraparenchymal hemorrhage or extra-axial collection. No evidence of acute cortical infarct. There is periventricular hypoattenuation compatible with chronic microvascular disease. Vascular: No hyperdense vessel or unexpected calcification. Skull: Normal visualized skull base, calvarium and extracranial soft tissues. Sinuses/Orbits: There are frothy secretions and mucosal thickening within the left frontal sinus. Normal  orbits. IMPRESSION: 1. No acute intracranial abnormality. 2. Frothy secretions within the left frontal sinus may indicate acute sinusitis. Electronically Signed   By: Ulyses Jarred M.D.   On: 03/18/2016 22:27    Lab Data:  CBC:  Recent Labs Lab 03/18/16 2114 03/19/16 0523  WBC 3.1* 3.0*  NEUTROABS 2.7  --   HGB 11.6* 10.6*  HCT 34.0* 31.3*  MCV 103.3* 104.3*  PLT 84* 75*   Basic Metabolic Panel:  Recent Labs Lab 03/18/16 2114 03/19/16 0523  NA 133* 135  K 3.8 3.7  CL 107 109  CO2 21* 16*  GLUCOSE 147* 190*  BUN 9 8  CREATININE 0.70 0.72  CALCIUM 9.0 8.7*   GFR: Estimated Creatinine Clearance: 54.6 mL/min (by C-G formula based on SCr of 0.72 mg/dL). Liver Function Tests:  Recent Labs Lab 03/18/16 2114 03/19/16 0523  AST 60* 53*  ALT 44 40  ALKPHOS 74 57  BILITOT 1.2 1.1  PROT 6.2* 6.1*  ALBUMIN 3.3* 3.5   No results for input(s): LIPASE, AMYLASE in the last 168 hours.  Recent Labs Lab 03/18/16 2114  AMMONIA 86*   Coagulation Profile:  Recent Labs Lab 03/18/16 2114  INR 1.26   Cardiac Enzymes: No results for input(s): CKTOTAL, CKMB, CKMBINDEX, TROPONINI in the last 168 hours. BNP (last 3 results) No results for input(s): PROBNP in the last 8760 hours. HbA1C: No results for input(s): HGBA1C in the last 72 hours. CBG:  Recent Labs Lab 03/19/16 1705 03/19/16 2303 03/20/16 0929 03/20/16 1146 03/20/16 1643  GLUCAP 106* 130* 185* 162* 139*   Lipid Profile: No results for input(s): CHOL, HDL, LDLCALC, TRIG, CHOLHDL, LDLDIRECT in the last 72 hours. Thyroid Function Tests:  Recent Labs  03/19/16 0523  TSH 0.026*   Anemia Panel: No results for input(s): VITAMINB12, FOLATE, FERRITIN, TIBC, IRON, RETICCTPCT in the last 72 hours. Urine analysis:    Component Value Date/Time   COLORURINE YELLOW 03/18/2016 2235   APPEARANCEUR HAZY (A) 03/18/2016 2235   LABSPEC 1.025 03/18/2016 2235  PHURINE 5.0 03/18/2016 2235   GLUCOSEU NEGATIVE  03/18/2016 2235   HGBUR SMALL (A) 03/18/2016 2235   BILIRUBINUR NEGATIVE 03/18/2016 2235   KETONESUR 20 (A) 03/18/2016 2235   PROTEINUR NEGATIVE 03/18/2016 2235   UROBILINOGEN 1 07/07/2013 1507   NITRITE NEGATIVE 03/18/2016 2235   LEUKOCYTESUR NEGATIVE 03/18/2016 2235     Carigan Lister M.D. PhD [facility] 03/20/2016, 5:03 PM   Between 7am to 7pm - call Pager - [phone]  After 7pm go to [email or web address] - password [email or web address]  Call night coverage person covering after 7pm

## 2016-03-20 NOTE — Progress Notes (Signed)
Physical Therapy Treatment Patient Details Name: Beverly Davis MRN: [id number] DOB: 17-Sep-1940 Today's Date: 03/20/2016    History of Present Illness  Beverly Davis is a 75 y.o. female with medical history significant of liver cirrhosis, diabetes mellitus, hypothyroidism, breast cancer (s/p of right lumpectomy,s/p of radiation therapy, no chemotherapy 2008), osteoporosis, chronic thrombocytopenia, who presents with altered mental status and multiple falls.    PT Comments    Pt significantly improved with mobility and with better mentation.   Follow Up Recommendations  Home health PT;Supervision/Assistance - 24 hour     Equipment Recommendations  Other (comment)    Recommendations for Other Services       Precautions / Restrictions Precautions Precautions: Fall    Mobility  Bed Mobility Overal bed mobility: Needs Assistance             General bed mobility comments: oob on arrival  Transfers Overall transfer level: Needs assistance Equipment used: None Transfers: Sit to/from Stand Sit to Stand: Min guard            Ambulation/Gait Ambulation/Gait assistance: Min guard Ambulation Distance (Feet): 400 Feet Assistive device: None Gait Pattern/deviations: Step-through pattern   Gait velocity interpretation: at or above normal speed for age/gender General Gait Details: characterized with drifting, occasional scissoring, but generally steady and managed moderate challenge to balance with no overt LOB, but with a few deviations.   Stairs            Wheelchair Mobility    Modified Rankin (Stroke Patients Only)       Balance Overall balance assessment: Needs assistance Sitting-balance support: No upper extremity supported Sitting balance-Leahy Scale: Fair     Standing balance support: No upper extremity supported Standing balance-Leahy Scale: Fair                      Cognition Arousal/Alertness: Awake/alert Behavior During  Therapy: WFL for tasks assessed/performed Overall Cognitive Status: Impaired/Different from baseline Area of Impairment: Safety/judgement;Awareness;Problem solving         Safety/Judgement: Decreased awareness of safety;Decreased awareness of deficits Awareness: Emergent Problem Solving: Slow processing General Comments: mildly distractable, better, but not her normal.    Exercises      General Comments        Pertinent Vitals/Pain Pain Assessment: No/denies pain    Home Living                      Prior Function            PT Goals (current goals can now be found in the care plan section) Acute Rehab PT Goals Patient Stated Goal: go home PT Goal Formulation: With patient/family Time For Goal Achievement: 03/26/16 Potential to Achieve Goals: Good Progress towards PT goals: Progressing toward goals    Frequency    Min 3X/week      PT Plan Current plan remains appropriate    Co-evaluation             End of Session   Activity Tolerance: Patient tolerated treatment well Patient left: in bed;with call bell/phone within reach;with chair alarm set;with family/visitor present     Time: 1445-1515 PT Time Calculation (min) (ACUTE ONLY): 30 min  Charges:  $Gait Training: 8-22 mins $Therapeutic Activity: 8-22 mins                    G CodesTessie Fass Angelik Walls 03/20/2016, 4:10 PM 03/20/2016  Donnella Sham, PT [phone]  (pager)

## 2016-03-20 NOTE — Progress Notes (Signed)
OT Cancellation Note  Patient Details Name: CRYSTALLYNN NOORANI MRN: [id number] DOB: 1940-06-09   Cancelled Treatment:    Reason Eval/Treat Not Completed: Other (comment) Pt with continuous diarrhea at this time. OT to hold evaluation to a more appropriate time.    Vonita Moss   OTR/L Pager: [phone] Office: [phone] .  03/20/2016, 1:42 PM

## 2016-03-21 ENCOUNTER — Inpatient Hospital Stay (HOSPITAL_COMMUNITY): Payer: Medicare HMO

## 2016-03-21 LAB — CBC
HEMATOCRIT: 27.4 % — AB (ref 36.0–46.0)
HEMOGLOBIN: 9.7 g/dL — AB (ref 12.0–15.0)
MCH: 35.8 pg — AB (ref 26.0–34.0)
MCHC: 35.4 g/dL (ref 30.0–36.0)
MCV: 101.1 fL — ABNORMAL HIGH (ref 78.0–100.0)
Platelets: 77 10*3/uL — ABNORMAL LOW (ref 150–400)
RBC: 2.71 MIL/uL — AB (ref 3.87–5.11)
RDW: 13.9 % (ref 11.5–15.5)
WBC: 3.2 10*3/uL — ABNORMAL LOW (ref 4.0–10.5)

## 2016-03-21 LAB — COMPREHENSIVE METABOLIC PANEL
ALBUMIN: 3.6 g/dL (ref 3.5–5.0)
ALK PHOS: 44 U/L (ref 38–126)
ALT: 34 U/L (ref 14–54)
AST: 50 U/L — ABNORMAL HIGH (ref 15–41)
Anion gap: 8 (ref 5–15)
BILIRUBIN TOTAL: 1.6 mg/dL — AB (ref 0.3–1.2)
BUN: 8 mg/dL (ref 6–20)
CALCIUM: 8.3 mg/dL — AB (ref 8.9–10.3)
CO2: 15 mmol/L — AB (ref 22–32)
CREATININE: 0.69 mg/dL (ref 0.44–1.00)
Chloride: 109 mmol/L (ref 101–111)
GFR calc Af Amer: >60 mL/min (ref >60)
GFR calc non Af Amer: >60 mL/min (ref >60)
GLUCOSE: 131 mg/dL — AB (ref 65–99)
Potassium: 2.9 mmol/L — ABNORMAL LOW (ref 3.5–5.1)
SODIUM: 132 mmol/L — AB (ref 135–145)
Total Protein: 5.9 g/dL — ABNORMAL LOW (ref 6.5–8.1)

## 2016-03-21 LAB — GLUCOSE, CAPILLARY
Glucose-Capillary: 134 mg/dL — ABNORMAL HIGH (ref 65–99)
Glucose-Capillary: 219 mg/dL — ABNORMAL HIGH (ref 65–99)
Glucose-Capillary: 151 mg/dL — ABNORMAL HIGH (ref 65–99)
Glucose-Capillary: 137 mg/dL — ABNORMAL HIGH (ref 65–99)

## 2016-03-21 LAB — VITAMIN B12: Vitamin B-12: 417 pg/mL (ref 180–914)

## 2016-03-21 LAB — AMMONIA: Ammonia: 54 umol/L — ABNORMAL HIGH (ref 9–35)

## 2016-03-21 LAB — MAGNESIUM: Magnesium: 1.4 mg/dL — ABNORMAL LOW (ref 1.7–2.4)

## 2016-03-21 LAB — FOLATE: Folate: 15.7 ng/mL (ref >5.9)

## 2016-03-21 MED ORDER — LACTULOSE 10 GM/15ML PO SOLN
30.0000 g | Freq: Three times a day (TID) | ORAL | Status: DC
  Administered 2016-03-22 – 2016-03-23 (×4): 30 g via ORAL
  Filled 2016-03-21 (×4): qty 45

## 2016-03-21 MED ORDER — GUAIFENESIN ER 600 MG PO TB12
600.0000 mg | ORAL_TABLET | Freq: Two times a day (BID) | ORAL | Status: DC
  Administered 2016-03-21 – 2016-03-23 (×4): 600 mg via ORAL
  Filled 2016-03-21 (×4): qty 1

## 2016-03-21 MED ORDER — MAGNESIUM SULFATE 2 GM/50ML IV SOLN
2.0000 g | Freq: Once | INTRAVENOUS | Status: AC
  Administered 2016-03-21: 2 g via INTRAVENOUS
  Filled 2016-03-21 (×2): qty 50

## 2016-03-21 MED ORDER — POTASSIUM CHLORIDE CRYS ER 20 MEQ PO TBCR
40.0000 meq | EXTENDED_RELEASE_TABLET | ORAL | Status: AC
  Administered 2016-03-21 (×2): 40 meq via ORAL
  Filled 2016-03-21 (×2): qty 2

## 2016-03-21 MED ORDER — LACTULOSE 10 GM/15ML PO SOLN
10.0000 g | Freq: Two times a day (BID) | ORAL | Status: DC
  Administered 2016-03-21: 10 g via ORAL
  Filled 2016-03-21: qty 15

## 2016-03-21 MED ORDER — SODIUM BICARBONATE 650 MG PO TABS
650.0000 mg | ORAL_TABLET | Freq: Two times a day (BID) | ORAL | Status: DC
  Administered 2016-03-21 – 2016-03-22 (×2): 650 mg via ORAL
  Filled 2016-03-21 (×2): qty 1

## 2016-03-21 NOTE — Evaluation (Addendum)
Occupational Therapy Evaluation Patient Details Name: Beverly Davis MRN: [id number] DOB: [birth year] Today's Date: 03/21/2016    History of Present Illness  Beverly Davis is a 75 y.o. female with medical history significant of liver cirrhosis, diabetes mellitus, hypothyroidism, breast cancer (s/p of right lumpectomy,s/p of radiation therapy, no chemotherapy 2008), osteoporosis, chronic thrombocytopenia, who presents with altered mental status and multiple falls.   Clinical Impression   Pt admitted with the above diagnoses and presents with below problem list. Pt will benefit from continued acute OT to address the below listed deficits and maximize independence with basic ADLs prior to d/c to venue below. PTA pt was independent with ADLs. She is currently supervision to min guard with most ADLs, min guard to min A for tub transfer. Some decreased cognition/AMS and generalized weakness impacting level of assist with ADLs. Session details below.      Follow Up Recommendations  Home health OT;Supervision/Assistance - 24 hour    Equipment Recommendations  3 in 1 bedside commode    Recommendations for Other Services       Precautions / Restrictions Precautions Precautions: Fall Restrictions Weight Bearing Restrictions: No      Mobility Bed Mobility Overal bed mobility: Needs Assistance Bed Mobility: Supine to Sit     Supine to sit: Min guard        Transfers Overall transfer level: Needs assistance Equipment used: None Transfers: Sit to/from Stand Sit to Stand: Min guard              Balance Overall balance assessment: Needs assistance Sitting-balance support: No upper extremity supported Sitting balance-Leahy Scale: Fair     Standing balance support: No upper extremity supported Standing balance-Leahy Scale: Fair Standing balance comment: seeking external support at times during dynamic standing balance                            ADL  Overall ADL's : Needs assistance/impaired Eating/Feeding: Set up;Sitting   Grooming: Min guard;Wash/dry hands;Oral care;Standing   Upper Body Bathing: Set up;Supervision/ safety;Sitting   Lower Body Bathing: Min guard;Sit to/from stand   Upper Body Dressing : Set up;Sitting;Supervision/safety   Lower Body Dressing: Min guard;Sit to/from stand   Toilet Transfer: Min guard;Ambulation;Regular Toilet;Grab bars Toilet Transfer Details (indicate cue type and reason): min guard for safety. Seeking external support Toileting- Clothing Manipulation and Hygiene: Min guard;Sit to/from stand   Tub/ Shower Transfer: Tub transfer;Minimal assistance;Ambulation;Min guard   Functional mobility during ADLs: Min guard General ADL Comments: Pt completed bed mobility, toilet transfer, pericare, and grooming tasks in standing position. Educated on energy conservation, DME recommendation, and fall prevention.      Vision     Perception     Praxis      Pertinent Vitals/Pain Pain Assessment: No/denies pain     Hand Dominance     Extremity/Trunk Assessment Upper Extremity Assessment Upper Extremity Assessment: Overall WFL for tasks assessed;Generalized weakness   Lower Extremity Assessment Lower Extremity Assessment: Defer to PT evaluation       Communication Communication Communication: No difficulties   Cognition Arousal/Alertness: Awake/alert Behavior During Therapy: WFL for tasks assessed/performed Overall Cognitive Status: Impaired/Different from baseline Area of Impairment: Safety/judgement;Awareness;Problem solving         Safety/Judgement: Decreased awareness of safety;Decreased awareness of deficits Awareness: Emergent Problem Solving: Slow processing     General Comments       Exercises       Shoulder Instructions  Home Living Family/patient expects to be discharged to:: Private residence Living Arrangements: Spouse/significant other Available Help at  Discharge: Family;Available 24 hours/day;Available PRN/intermittently Type of Home: House Home Access: Stairs to enter [employer] of Steps: 2-4 Entrance Stairs-Rails: Right;Left Home Layout: One level     Bathroom Shower/Tub: Teacher, early years/pre: Standard     Home Equipment: None          Prior Functioning/Environment Level of Independence: Independent        Comments: keeps grandchildren        OT Problem List: Decreased activity tolerance;Impaired balance (sitting and/or standing);Decreased strength;Decreased cognition;Decreased safety awareness;Decreased knowledge of use of DME or AE;Decreased knowledge of precautions   OT Treatment/Interventions: Self-care/ADL training;Therapeutic exercise;Energy conservation;DME and/or AE instruction;Therapeutic activities;Cognitive remediation/compensation;Patient/family education;Balance training    OT Goals(Current goals can be found in the care plan section) Acute Rehab OT Goals Patient Stated Goal: go home OT Goal Formulation: With patient Time For Goal Achievement: 04/04/16 Potential to Achieve Goals: Good ADL Goals Pt Will Perform Grooming: with modified independence;sitting;standing (full grooming session, gathering items) Pt Will Perform Upper Body Bathing: with modified independence;sitting Pt Will Perform Lower Body Bathing: with modified independence;sit to/from stand Pt Will Transfer to Toilet: with modified independence;ambulating Pt Will Perform Toileting - Clothing Manipulation and hygiene: with modified independence;sit to/from stand Pt Will Perform Tub/Shower Transfer: Tub transfer;with min guard assist;ambulating;3 in 1  OT Frequency: Min 2X/week   Barriers to D/C:            Co-evaluation              End of Session    Activity Tolerance: Patient tolerated treatment well Patient left: in chair;with call bell/phone within reach;with chair alarm set   Time: 9242-6834 OT  Time Calculation (min): 28 min Charges:  OT General Charges $OT Visit: 1 Procedure OT Evaluation $OT Eval Low Complexity: 1 Procedure OT Treatments $Self Care/Home Management : 8-22 mins G-Codes:    Beverly Davis Apr 19, 2016, 9:53 AM

## 2016-03-21 NOTE — Consult Note (Signed)
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  She was noted to be confused and subsequently admitted to the hospital on 03/18/2016.  With supportive care her hepatic encephalopathy has improved.  A RUQ U/S was performed and it was significant for a 4 cm mass and a smaller 9 mm mass.  Her last EGD and Colonoscopy was in 06/2015 with findings of small esophageal varices, which were unchanged from 2013.  Since I started managing her in 2010 financial cost were always and issue, therefore, the routine examination that I desired did not occur as recommended.  Her last ultrasound was on 07/2011 with no findings of any masses.  This is the first time that she has displayed any issues with hepatic decompensation.  Past Medical History:  Diagnosis Date  . Breast cancer ([place]) 05/2006   Right breast cancer  . Cirrhosis ([place])   . Diabetes mellitus without complication ([place])   . Fibroids   . Hypertension   . Osteopenia   . Thrombocytopenia ([place])   . Thyroid disease     Past Surgical History:  Procedure Laterality Date  . ABDOMINAL HYSTERECTOMY     TAH BSO  . BREAST LUMPECTOMY WITH AXILLARY LYMPH NODE BIOPSY Right 06/25/2006  . CHOLECYSTECTOMY    . TUBAL LIGATION  1978    Family History  Problem Relation Age of Onset  . Cancer Mother     Brain cancer  . Cancer Father     Prostate cancer  . Diabetes Sister   . Hypertension Sister   . Liver disease Brother   . Diabetes Maternal Aunt   . Heart Problems Maternal Grandfather   . Cancer Paternal Grandfather     Unknown cancer  . Diabetes Sister     Social History:  reports that she has never smoked. She has never used smokeless tobacco. She reports that she does not drink alcohol or use drugs.  Allergies:  Allergies  Allergen Reactions  . Metformin And Related Other (See Comments)    TAKING  MEDICATION CURRENTLY  . Welchol [Colesevelam Hcl] Other (See Comments)    Medications:  Scheduled: . cefTRIAXone (ROCEPHIN)  IV  1 g Intravenous QHS  . cholecalciferol  4,000 Units Oral BID  . fluticasone  2 spray Each Nare Daily  . guaiFENesin  600 mg Oral BID  . hydrocortisone   Rectal BID  . insulin aspart  0-5 Units Subcutaneous QHS  . insulin aspart  0-9 Units Subcutaneous TID WC  . lactulose  10 g Oral BID  . levothyroxine  200 mcg Oral QAC breakfast  . omega-3 acid ethyl esters  1 g Oral Daily  . sodium bicarbonate  650 mg Oral BID  . sodium chloride flush  3 mL Intravenous Q12H  . triamcinolone cream  1 application Topical BID   Continuous:   Results for orders placed or performed during the hospital encounter of 03/18/16 (from the past 24 hour(s))  Glucose, capillary     Status: Abnormal   Collection Time: 03/20/16  4:43 PM  Result Value Ref Range   Glucose-Capillary 139 (H) 65 - 99 mg/dL  Glucose, capillary     Status: Abnormal   Collection Time: 03/20/16  9:24 PM  Result Value Ref Range   Glucose-Capillary 148 (H) 65 - 99 mg/dL  CBC     Status: Abnormal   Collection  Time: 03/21/16  5:10 AM  Result Value Ref Range   WBC 3.2 (L) 4.0 - 10.5 K/uL   RBC 2.71 (L) 3.87 - 5.11 MIL/uL   Hemoglobin 9.7 (L) 12.0 - 15.0 g/dL   HCT 27.4 (L) 36.0 - 46.0 %   MCV 101.1 (H) 78.0 - 100.0 fL   MCH 35.8 (H) 26.0 - 34.0 pg   MCHC 35.4 30.0 - 36.0 g/dL   RDW 13.9 11.5 - 15.5 %   Platelets 77 (L) 150 - 400 K/uL  Comprehensive metabolic panel     Status: Abnormal   Collection Time: 03/21/16  5:10 AM  Result Value Ref Range   Sodium 132 (L) 135 - 145 mmol/L   Potassium 2.9 (L) 3.5 - 5.1 mmol/L   Chloride 109 101 - 111 mmol/L   CO2 15 (L) 22 - 32 mmol/L   Glucose, Bld 131 (H) 65 - 99 mg/dL   BUN 8 6 - 20 mg/dL   Creatinine, Ser 0.69 0.44 - 1.00 mg/dL   Calcium 8.3 (L) 8.9 - 10.3 mg/dL   Total Protein 5.9 (L) 6.5 - 8.1 g/dL   Albumin 3.6 3.5 - 5.0 g/dL   AST 50 (H) 15 - 41  U/L   ALT 34 14 - 54 U/L   Alkaline Phosphatase 44 38 - 126 U/L   Total Bilirubin 1.6 (H) 0.3 - 1.2 mg/dL   GFR calc non Af Amer >60 >60 mL/min   GFR calc Af Amer >60 >60 mL/min   Anion gap 8 5 - 15  Magnesium     Status: Abnormal   Collection Time: 03/21/16  5:10 AM  Result Value Ref Range   Magnesium 1.4 (L) 1.7 - 2.4 mg/dL  Ammonia     Status: Abnormal   Collection Time: 03/21/16  5:10 AM  Result Value Ref Range   Ammonia 54 (H) 9 - 35 umol/L  Folate     Status: None   Collection Time: 03/21/16  5:10 AM  Result Value Ref Range   Folate 15.7 >5.9 ng/mL  Vitamin B12     Status: None   Collection Time: 03/21/16  5:10 AM  Result Value Ref Range   Vitamin B-12 417 180 - 914 pg/mL  Glucose, capillary     Status: Abnormal   Collection Time: 03/21/16  7:32 AM  Result Value Ref Range   Glucose-Capillary 134 (H) 65 - 99 mg/dL  Glucose, capillary     Status: Abnormal   Collection Time: 03/21/16 11:49 AM  Result Value Ref Range   Glucose-Capillary 219 (H) 65 - 99 mg/dL     US Abdomen Limited Ruq  Result Date: 03/21/2016 CLINICAL DATA:  Hepatic cirrhosis EXAM: US ABDOMEN LIMITED - RIGHT UPPER QUADRANT COMPARISON:  Aug 02, 2011 FINDINGS: Gallbladder: Surgically absent. Common bile duct: Diameter: 4 mm. No intrahepatic or extrahepatic biliary duct dilatation. Liver: Liver has a coarsened echotexture with overall increased echogenicity consistent with known underlying cirrhosis. The liver contour is nodular, again consistent with cirrhosis. There is a hyperechoic mass arising in the anterior segment of the right lobe measuring 4.0 x 2.8 x 4.0 cm. A much smaller mass is noted near the gallbladder fossa region measuring 0.9 x 0.6 x 0.8 cm. No other focal liver lesions are identified. IMPRESSION: Underlying hepatic cirrhosis. Mass lesions in the liver as noted above, largest measuring 4.0 x 2.8 x 4.0 cm. The possibility of hepatocellular carcinoma must be of concern in this circumstance. Atypical  appearance of dysplasia and metastatic  disease are differential considerations. Further evaluation is warranted. Advise precontrast and serial post-contrast MR or CT of the liver to further assess in this regard. Gallbladder absent.  No biliary duct dilatation evident. These results will be called to the ordering clinician or representative by the Radiologist Assistant, and communication documented in the PACS or zVision Dashboard. Electronically Signed   By: Lowella Grip III M.D.   On: 03/21/2016 08:35    ROS:  As stated above in the HPI otherwise negative.  Blood pressure (!) 124/52, pulse 83, temperature 98.8 F (37.1 C), temperature source Oral, resp. rate 18, height 5' 2"  (1.575 m), weight 64.9 kg (143 lb 1.6 oz), SpO2 94 %.    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 3) NASH cirrhosis. 4) History of breast cancer   It is unfortunate that finances precluded her form the desired follow up.  At this juncture she is not a transplant candidate with her age and the size of the lesion.  Definitive characterization will be needed with an MRI.  She has hepatic encephalopathy and her lactulose will need to be increased.  Plan: 1) MRI. 2) Anticipate Oncology evaluation. 3) Continue with supportive care. 4) Increase lactulose to 30 grams TID.  She still has significant hepatic encephalopathy.  Suzi Hernan D 03/21/2016, 3:22 PM

## 2016-03-21 NOTE — Progress Notes (Signed)
[facility]                                                                              Patient Demographics  Beverly Davis, is a 75 y.o. female, DOB - 18-Jan-1941, AJO:[id number]  Admit date - 03/18/2016   Admitting Physician Ivor Costa, MD  Outpatient Primary MD for the patient is Alesia Richards, MD  Outpatient specialists:   LOS - 2  days    Chief Complaint  Patient presents with  . Weakness  . Fall       Brief summary   Patient is a 75 year old female with liver cirrhosis, diabetes mellitus, hypothyroidism, breast cancer (s/p of right lumpectomy,s/p of radiation therapy, no chemotherapy 2008), osteoporosis, chronic thrombocytopenia, who presents with altered mental status and multiple falls. She was found to have ammonia level of 86, abnormal liver function with AST 60, ALT 44, total bilirubin 1.6.  Assessment & Plan    Hepatic encephalopathy ([place]):  - Underlying history of liver cirrhosis, AMS, elevated ammonia level and asterixis, consistent with hepatic encephalopathy. No focal neurological deficits  - she is started on  lactulose , iv rocephin empirically, albumin,  blood culture in process -patient she report she is followed  By GI Dr Benson [place], she had egd and colonoscopy about 72month ago which was unremarkable. She report he has cirrhosis which is hereditary - denies abdominal pain, she is less confused, ammonia down, continue titrate lactulose with goal 3-4 bm daily.  Liver cirrhosis: Patient is not drinking alcohol. She had hepatic panel on 01/31/16 which showed negative hepatitis C and B, but with positive hepatitis A antibody. Child-Pugh score 8-->class B. Now has hepatic encephalopathy. -Avoid Tylenol - liver ultrasound with liver masses, MRI liver ordered, GI Dr HBenson Norwayconsulted, Baseline cea/afp in process.  Fall: Most likely due to altered mental status. CT head is negative for acute intracranial abnormalities. -PT OT evaluation, likely  need home health   Thrombocytopenia: plt 116 in 12/2015, plt 84 on admission, today is 75 No hypoxia, no edema, thrombocytopenia likely due to cirrhosis.  She does not appear to have any significant infection, will check b12/folate May need to refer to hematology if thrombocytopenia persist  Leukopenia: appear to be chronic   Hypokalemia/hypomagnesemia: replace k and mag  Metabolic acidosis: sodium bicarb supplement  Hypothyroidism: Last TSH was 0.01 on 01/31/16  -TSH 0.026, too suppressed, decrease Synthroid dose, 2250m-> 20015mdaily   DM-II: Last A1c 6.4 on 12/27/15, well controled. Patient is taking metformin, glipizide and tradjenda at home. -Placed on sliding scale insulin  Possible Sepsis?:   she did have elevated lactate, tachycardia, neutropenia on admission. Her elevated lactate could also be partially from metformin use , dehydration and cirrhosis  The source of infection includes possible SBP and acute sinusitis as shown by CT head. -Lactic acid improving 2.1, pro calcitonin 1.46, continue gentle hydration, IV Rocephin   -add flonase,  abdominal us Koread not mention ascites  Sinusitis: start flonase, mucinex, she does has dry cough, but denies headache or   Code Status:Full code  DVT Prophylaxis:   SCD's Family Communication: patient in room, son [phone]  over the phone with patient's permission   Disposition Plan: home with home health in 1-2 days, will need gi and oncology follow up after discharge  Time Spent in minutes   35 minutes  Procedures:  None  Consultants:   GI Dr hung Antimicrobials:   IV Rocephin 12/26   Medications  Scheduled Meds: . cefTRIAXone (ROCEPHIN)  IV  1 g Intravenous QHS  . cholecalciferol  4,000 Units Oral BID  . fluticasone  2 spray Each Nare Daily  . hydrocortisone   Rectal BID  . insulin aspart  0-5 Units Subcutaneous QHS  . insulin aspart  0-9 Units Subcutaneous TID WC  . lactulose  10 g Oral BID  .  levothyroxine  200 mcg Oral QAC breakfast  . magnesium sulfate 1 - 4 g bolus IVPB  2 g Intravenous Once  . omega-3 acid ethyl esters  1 g Oral Daily  . potassium chloride  40 mEq Oral Q4H  . sodium bicarbonate  650 mg Oral BID  . sodium chloride flush  3 mL Intravenous Q12H  . triamcinolone cream  1 application Topical BID   Continuous Infusions: . sodium chloride 100 mL/hr at 03/19/16 0336   PRN Meds:.dextromethorphan-guaiFENesin, ondansetron, zolpidem   Antibiotics   Anti-infectives    Start     Dose/Rate Route Frequency Ordered Stop   03/19/16 0300  cefTRIAXone (ROCEPHIN) 1 g in dextrose 5 % 50 mL IVPB     1 g 100 mL/hr over 30 Minutes Intravenous Daily at bedtime 03/19/16 0019          Subjective:   Beverly Davis  she is more alert and  less confused, she is oriented x3, but still has poor memory. She has intermittent dry cough during encounter No lower extremity edema on exam  Objective:   Vitals:   03/20/16 0958 03/20/16 1658 03/20/16 2125 03/21/16 0510  BP: (!) 111/56 (!) 115/47 (!) 113/46 (!) 107/42  Pulse: 79 83 78 84  Resp: 17 18 18 17   Temp: 98.8 F (37.1 C) 99.4 F (37.4 C) 98.9 F (37.2 C) 99.5 F (37.5 C)  TempSrc: Oral Oral Oral Oral  SpO2: 98% 96% 94% 95%  Weight:   64.9 kg (143 lb 1.6 oz)   Height:        Intake/Output Summary (Last 24 hours) at 03/21/16 0819 Last data filed at 03/21/16 0329  Gross per 24 hour  Intake              940 ml  Output                0 ml  Net              940 ml     Wt Readings from Last 3 Encounters:  03/20/16 64.9 kg (143 lb 1.6 oz)  12/27/15 65.8 kg (145 lb)  09/05/15 67.8 kg (149 lb 6.4 oz)     Exam  General:  Much more alert, less confused, aaox3.  HEENT:  unremarkable  Cardiovascular: S1 S2 auscultated, no rubs, murmurs or gallops. Regular rate and rhythm.  Respiratory: Clear to auscultation bilaterally, no wheezing, rales or rhonchi  Gastrointestinal: protuberant , Soft, nontender, +  bowel sounds  Ext: no cyanosis clubbing or edema, Asterixis   Neuro: less confused. Strength 5/5 upper and lower extremities bilaterally, does has impaired short term memory  Skin: No rashes  Psych: pleasant    Data Reviewed:  I have personally reviewed following labs and imaging studies  Micro Results Recent Results (from the past 240 hour(s))  Culture, blood (x 2)     Status: None (Preliminary result)   Collection Time: 03/19/16 12:55 AM  Result Value Ref Range Status   Specimen Description BLOOD RIGHT ARM  Final   Special Requests BOTTLES DRAWN AEROBIC AND ANAEROBIC 10ML  Final   Culture NO GROWTH 1 DAY  Final   Report Status PENDING  Incomplete  Culture, blood (x 2)     Status: None (Preliminary result)   Collection Time: 03/19/16  1:05 AM  Result Value Ref Range Status   Specimen Description BLOOD RIGHT FOREARM  Final   Special Requests BOTTLES DRAWN AEROBIC AND ANAEROBIC 5ML  Final   Culture NO GROWTH 1 DAY  Final   Report Status PENDING  Incomplete    Radiology Reports Dg Chest 2 View  Result Date: 03/18/2016 CLINICAL DATA:  Cough for 1 day EXAM: CHEST  2 VIEW COMPARISON:  05/11/2008 FINDINGS: Cardiac shadow is at the upper limits of normal in size. The lungs are well aerated bilaterally. No acute bony abnormality is seen. IMPRESSION: No active cardiopulmonary disease. Electronically Signed   By: Inez Catalina M.D.   On: 03/18/2016 22:01   Ct Head Wo Contrast  Result Date: 03/18/2016 CLINICAL DATA:  Tremors, altered mental status and gait abnormality EXAM: CT HEAD WITHOUT CONTRAST TECHNIQUE: Contiguous axial images were obtained from the base of the skull through the vertex without intravenous contrast. COMPARISON:  None. FINDINGS: Brain: No mass lesion, intraparenchymal hemorrhage or extra-axial collection. No evidence of acute cortical infarct. There is periventricular hypoattenuation compatible with chronic microvascular disease. Vascular: No hyperdense vessel or  unexpected calcification. Skull: Normal visualized skull base, calvarium and extracranial soft tissues. Sinuses/Orbits: There are frothy secretions and mucosal thickening within the left frontal sinus. Normal orbits. IMPRESSION: 1. No acute intracranial abnormality. 2. Frothy secretions within the left frontal sinus may indicate acute sinusitis. Electronically Signed   By: Ulyses Jarred M.D.   On: 03/18/2016 22:27    Lab Data:  CBC:  Recent Labs Lab 03/18/16 2114 03/19/16 0523 03/21/16 0510  WBC 3.1* 3.0* 3.2*  NEUTROABS 2.7  --   --   HGB 11.6* 10.6* 9.7*  HCT 34.0* 31.3* 27.4*  MCV 103.3* 104.3* 101.1*  PLT 84* 75* 77*   Basic Metabolic Panel:  Recent Labs Lab 03/18/16 2114 03/19/16 0523 03/21/16 0510  NA 133* 135 132*  K 3.8 3.7 2.9*  CL 107 109 109  CO2 21* 16* 15*  GLUCOSE 147* 190* 131*  BUN 9 8 8   CREATININE 0.70 0.72 0.69  CALCIUM 9.0 8.7* 8.3*  MG  --   --  1.4*   GFR: Estimated Creatinine Clearance: 54.5 mL/min (by C-G formula based on SCr of 0.69 mg/dL). Liver Function Tests:  Recent Labs Lab 03/18/16 2114 03/19/16 0523 03/21/16 0510  AST 60* 53* 50*  ALT 44 40 34  ALKPHOS 74 57 44  BILITOT 1.2 1.1 1.6*  PROT 6.2* 6.1* 5.9*  ALBUMIN 3.3* 3.5 3.6   No results for input(s): LIPASE, AMYLASE in the last 168 hours.  Recent Labs Lab 03/18/16 2114 03/21/16 0510  AMMONIA 86* 54*   Coagulation Profile:  Recent Labs Lab 03/18/16 2114  INR 1.26   Cardiac Enzymes: No results for input(s): CKTOTAL, CKMB, CKMBINDEX, TROPONINI in the last 168 hours. BNP (last 3 results) No results for input(s): PROBNP in the last 8760 hours. HbA1C: No results for input(s): HGBA1C in the last 72 hours. CBG:  Recent Labs Lab 03/20/16 0929 03/20/16 1146 03/20/16 1643 03/20/16 2124 03/21/16 0732  GLUCAP 185* 162* 139* 148* 134*   Lipid Profile: No results for input(s): CHOL, HDL, LDLCALC, TRIG, CHOLHDL, LDLDIRECT in the last 72 hours. Thyroid Function  Tests:  Recent Labs  03/19/16 0523  TSH 0.026*   Anemia Panel:  Recent Labs  03/21/16 0510  VITAMINB12 417  FOLATE 15.7   Urine analysis:    Component Value Date/Time   COLORURINE YELLOW 03/18/2016 2235   APPEARANCEUR HAZY (A) 03/18/2016 2235   LABSPEC 1.025 03/18/2016 2235   PHURINE 5.0 03/18/2016 2235   GLUCOSEU NEGATIVE 03/18/2016 2235   HGBUR SMALL (A) 03/18/2016 2235   BILIRUBINUR NEGATIVE 03/18/2016 2235   KETONESUR 20 (A) 03/18/2016 2235   PROTEINUR NEGATIVE 03/18/2016 2235   UROBILINOGEN 1 07/07/2013 1507   NITRITE NEGATIVE 03/18/2016 2235   LEUKOCYTESUR NEGATIVE 03/18/2016 2235     Aviance Cooperwood M.D. PhD [facility] 03/21/2016, 8:19 AM   Between 7am to 7pm - call Pager - [phone]  After 7pm go to [email or web address] - password [email or web address]  Call night coverage person covering after 7pm

## 2016-03-21 NOTE — Care Management Note (Signed)
Case Management Note  Patient Details  Name: Beverly Davis MRN: [id number] Date of Birth: 28-Apr-1940  Subjective/Objective:    CM following for progression and d/c planning.                 Action/Plan: 03/21/2016 Met with pt and sister re d/c plan, pt selected [facility] for HHRN and HHPT and dme. Rolling walker and 3:1 ordered from [facility] and will be delivered to room. [place] notified of [place] needs.   Expected Discharge Date:     03/22/2016             Expected Discharge Plan:  [place]  In-House Referral:  NA  Discharge planning Services  CM Consult  Post Acute Care Choice:    Choice offered to:  Patient  DME Arranged:  3-N-1, Walker rolling DME Agency:  [place] Arranged:  RN, PT [facility] Agency:  [place]  Status of Service:  Completed, signed off  If discussed at [place] of Stay Meetings, dates discussed:    Additional Comments:  Adron Bene, RN 03/21/2016, 1:48 PM

## 2016-03-22 ENCOUNTER — Inpatient Hospital Stay (HOSPITAL_COMMUNITY): Payer: Medicare HMO

## 2016-03-22 ENCOUNTER — Encounter (HOSPITAL_COMMUNITY): Payer: Self-pay | Admitting: *Deleted

## 2016-03-22 DIAGNOSIS — K7460 Unspecified cirrhosis of liver: Secondary | ICD-10-CM

## 2016-03-22 DIAGNOSIS — C229 Malignant neoplasm of liver, not specified as primary or secondary: Secondary | ICD-10-CM

## 2016-03-22 LAB — CBC
HCT: 27.7 % — ABNORMAL LOW (ref 36.0–46.0)
HEMOGLOBIN: 9.8 g/dL — AB (ref 12.0–15.0)
MCH: 35 pg — AB (ref 26.0–34.0)
MCHC: 35.4 g/dL (ref 30.0–36.0)
MCV: 98.9 fL (ref 78.0–100.0)
Platelets: 72 10*3/uL — ABNORMAL LOW (ref 150–400)
RBC: 2.8 MIL/uL — AB (ref 3.87–5.11)
RDW: 13.5 % (ref 11.5–15.5)
WBC: 3.2 10*3/uL — ABNORMAL LOW (ref 4.0–10.5)

## 2016-03-22 LAB — COMPREHENSIVE METABOLIC PANEL
ALK PHOS: 44 U/L (ref 38–126)
ALT: 31 U/L (ref 14–54)
AST: 44 U/L — ABNORMAL HIGH (ref 15–41)
Albumin: 3.3 g/dL — ABNORMAL LOW (ref 3.5–5.0)
Anion gap: 8 (ref 5–15)
BUN: 8 mg/dL (ref 6–20)
CALCIUM: 8.3 mg/dL — AB (ref 8.9–10.3)
CO2: 16 mmol/L — AB (ref 22–32)
CREATININE: 0.7 mg/dL (ref 0.44–1.00)
Chloride: 109 mmol/L (ref 101–111)
GFR calc non Af Amer: >60 mL/min (ref >60)
GLUCOSE: 134 mg/dL — AB (ref 65–99)
Potassium: 3.5 mmol/L (ref 3.5–5.1)
SODIUM: 133 mmol/L — AB (ref 135–145)
Total Bilirubin: 2.1 mg/dL — ABNORMAL HIGH (ref 0.3–1.2)
Total Protein: 6 g/dL — ABNORMAL LOW (ref 6.5–8.1)

## 2016-03-22 LAB — MAGNESIUM: Magnesium: 1.9 mg/dL (ref 1.7–2.4)

## 2016-03-22 LAB — GLUCOSE, CAPILLARY
GLUCOSE-CAPILLARY: 149 mg/dL — AB (ref 65–99)
Glucose-Capillary: 125 mg/dL — ABNORMAL HIGH (ref 65–99)
Glucose-Capillary: 182 mg/dL — ABNORMAL HIGH (ref 65–99)
Glucose-Capillary: 131 mg/dL — ABNORMAL HIGH (ref 65–99)

## 2016-03-22 LAB — AMMONIA: AMMONIA: 41 umol/L — AB (ref 9–35)

## 2016-03-22 MED ORDER — GADOBENATE DIMEGLUMINE 529 MG/ML IV SOLN
15.0000 mL | Freq: Once | INTRAVENOUS | Status: AC
  Administered 2016-03-22: 13 mL via INTRAVENOUS

## 2016-03-22 MED ORDER — SODIUM BICARBONATE 650 MG PO TABS
1300.0000 mg | ORAL_TABLET | Freq: Two times a day (BID) | ORAL | Status: DC
  Administered 2016-03-22 – 2016-03-23 (×2): 1300 mg via ORAL
  Filled 2016-03-22 (×2): qty 2

## 2016-03-22 MED ORDER — LEVOFLOXACIN 750 MG PO TABS
750.0000 mg | ORAL_TABLET | Freq: Every day | ORAL | Status: DC
  Administered 2016-03-22 – 2016-03-23 (×2): 750 mg via ORAL
  Filled 2016-03-22 (×2): qty 1

## 2016-03-22 MED ORDER — POTASSIUM CHLORIDE CRYS ER 20 MEQ PO TBCR
40.0000 meq | EXTENDED_RELEASE_TABLET | Freq: Once | ORAL | Status: AC
  Administered 2016-03-22: 40 meq via ORAL
  Filled 2016-03-22: qty 2

## 2016-03-22 NOTE — Progress Notes (Signed)
Subjective: No complaints.  Feeling better.  Husband notes an improvement in her mentation.  Objective: Vital signs in last 24 hours: Temp:  [98 F (36.7 C)-100.4 F (38 C)] 98 F (36.7 C) (12/29 0932) Pulse Rate:  [83-99] 83 (12/29 0932) Resp:  [17-20] 18 (12/29 0932) BP: (107-138)/(55-60) 113/55 (12/29 0932) SpO2:  [93 %-95 %] 93 % (12/29 0932) Last BM Date: 03/22/16  Intake/Output from previous day: 12/28 0701 - 12/29 0700 In: 1755 [P.O.:720; I.V.:935; IV Piggyback:100] Out: 0  Intake/Output this shift: Total I/O In: 240 [P.O.:240] Out: -   General appearance: alert and no distress GI: soft, non-tender; bowel sounds normal; no masses,  no organomegaly improved asterixis  Lab Results:  Recent Labs  03/21/16 0510 03/22/16 0821  WBC 3.2* 3.2*  HGB 9.7* 9.8*  HCT 27.4* 27.7*  PLT 77* 72*   BMET  Recent Labs  03/21/16 0510 03/22/16 0821  NA 132* 133*  K 2.9* 3.5  CL 109 109  CO2 15* 16*  GLUCOSE 131* 134*  BUN 8 8  CREATININE 0.69 0.70  CALCIUM 8.3* 8.3*   LFT  Recent Labs  03/22/16 0821  PROT 6.0*  ALBUMIN 3.3*  AST 44*  ALT 31  ALKPHOS 44  BILITOT 2.1*   PT/INR No results for input(s): LABPROT, INR in the last 72 hours. Hepatitis Panel No results for input(s): HEPBSAG, HCVAB, HEPAIGM, HEPBIGM in the last 72 hours. C-Diff No results for input(s): CDIFFTOX in the last 72 hours. Fecal Lactopherrin No results for input(s): FECLLACTOFRN in the last 72 hours.  Studies/Results: Mr Liver W Wo Contrast  Result Date: 03/22/2016 CLINICAL DATA:  Evaluate liver lesions seen on recent ultrasound examination. History of cirrhosis. EXAM: MRI ABDOMEN WITHOUT AND WITH CONTRAST TECHNIQUE: Multiplanar multisequence MR imaging of the abdomen was performed both before and after the administration of intravenous contrast. CONTRAST:  65m MULTIHANCE GADOBENATE DIMEGLUMINE 529 MG/ML IV SOLN COMPARISON:  Ultrasound 03/21/2016 FINDINGS: Lower chest: Small right  pleural effusion with fairly extensive airspace process suspicious for pneumonia. This appears to be new since the prior chest x-ray. The heart is normal in size. No pericardial effusion. Hepatobiliary: Advanced cirrhotic changes involving the liver. Marked irregularity of the liver contour, increased caudate to right lobe ratio, dilated hepatic fissures, portal venous hypertension and portal venous collaterals. Imaging is limited due to significant patient motion. However, there is a 3 cm segment 8 liver lesion which shows early arterial phase enhancement and is consistent with hepatocellular carcinoma. I do not see any other obvious hepatic lesions or dysplastic nodules but imaging is quite limited. The portal and hepatic veins are patent. Pancreas:  No mass, inflammation or ductal dilatation. Spleen: Mild splenomegaly. No focal lesions. Massive perisplenic collateral vessels and a large splenorenal shunt. Adrenals/Urinary Tract: The adrenal glands and kidneys are grossly normal. A small left renal cyst is noted. Stomach/Bowel: Visualized portions within the abdomen are unremarkable. Vascular/Lymphatic: The aorta and branch vessels are patent. The major venous structures are patent. Extensive portal venous collaterals with massive perisplenic collateral vessels an a splenorenal shunt. No obvious enlarged periportal, celiac axis or upper abdominal lymph nodes. Other:  No ascites or abdominal wall hernia. Musculoskeletal: No significant bony findings. IMPRESSION: 1. 3 cm segment 8 liver lesion with early arterial phase enhancement and rapid washout consistent with hepatocellular carcinoma (LR5B). No findings for metastatic adenopathy or portal vein invasion. 2. Advanced cirrhotic changes involving the liver with portal venous hypertension, portal venous collaterals, massive perisplenic collaterals and splenorenal shunt. 3.  Mild splenomegaly but no ascites. Electronically Signed   By: Marijo Sanes M.D.   On:  03/22/2016 09:23   US Abdomen Limited Ruq  Result Date: 03/21/2016 CLINICAL DATA:  Hepatic cirrhosis EXAM: US ABDOMEN LIMITED - RIGHT UPPER QUADRANT COMPARISON:  Aug 02, 2011 FINDINGS: Gallbladder: Surgically absent. Common bile duct: Diameter: 4 mm. No intrahepatic or extrahepatic biliary duct dilatation. Liver: Liver has a coarsened echotexture with overall increased echogenicity consistent with known underlying cirrhosis. The liver contour is nodular, again consistent with cirrhosis. There is a hyperechoic mass arising in the anterior segment of the right lobe measuring 4.0 x 2.8 x 4.0 cm. A much smaller mass is noted near the gallbladder fossa region measuring 0.9 x 0.6 x 0.8 cm. No other focal liver lesions are identified. IMPRESSION: Underlying hepatic cirrhosis. Mass lesions in the liver as noted above, largest measuring 4.0 x 2.8 x 4.0 cm. The possibility of hepatocellular carcinoma must be of concern in this circumstance. Atypical appearance of dysplasia and metastatic disease are differential considerations. Further evaluation is warranted. Advise precontrast and serial post-contrast MR or CT of the liver to further assess in this regard. Gallbladder absent.  No biliary duct dilatation evident. These results will be called to the ordering clinician or representative by the Radiologist Assistant, and communication documented in the PACS or zVision Dashboard. Electronically Signed   By: Lowella Grip III M.D.   On: 03/21/2016 08:35    Medications:  Scheduled: . cefTRIAXone (ROCEPHIN)  IV  1 g Intravenous QHS  . cholecalciferol  4,000 Units Oral BID  . fluticasone  2 spray Each Nare Daily  . guaiFENesin  600 mg Oral BID  . hydrocortisone   Rectal BID  . insulin aspart  0-5 Units Subcutaneous QHS  . insulin aspart  0-9 Units Subcutaneous TID WC  . lactulose  30 g Oral TID  . levothyroxine  200 mcg Oral QAC breakfast  . omega-3 acid ethyl esters  1 g Oral Daily  . sodium bicarbonate  650  mg Oral BID  . sodium chloride flush  3 mL Intravenous Q12H  . triamcinolone cream  1 application Topical BID   Continuous:   Assessment/Plan: 1) HCC. 2) NASH cirrhosis. 3) Hepatic encephalopathy.   I discussed the findings with the patient and her family.  Her encephalopathy is improved with the increase in lactulose.  She is to maintain the current dosing.  I told her to expect diarrhea.    Plan: 1) Oncology consultation. 2) Continue with lactulose.     LOS: 3 days   Navada Osterhout D 03/22/2016, 1:08 PM

## 2016-03-22 NOTE — Progress Notes (Signed)
Physical Therapy Treatment Patient Details Name: Beverly Davis MRN: [id number] DOB: November 12, 1940 Today's Date: 03/22/2016    History of Present Illness  Beverly Davis is a 75 y.o. female with medical history significant of liver cirrhosis, diabetes mellitus, hypothyroidism, breast cancer (s/p of right lumpectomy,s/p of radiation therapy, no chemotherapy 2008), osteoporosis, chronic thrombocytopenia, who presents with altered mental status and multiple falls.    PT Comments    Patient making slow improvement with mobility.  Continues to have unsteady gait.  Recommend use of RW during gait, and agree with HHPT for continued therapy at d/c.  Follow Up Recommendations  Home health PT;Supervision/Assistance - 24 hour     Equipment Recommendations  Other (comment) (TBA, family has no DME)    Recommendations for Other Services       Precautions / Restrictions Precautions Precautions: Fall Restrictions Weight Bearing Restrictions: No    Mobility  Bed Mobility Overal bed mobility: Needs Assistance Bed Mobility: Supine to Sit;Sit to Supine     Supine to sit: Supervision Sit to supine: Supervision   General bed mobility comments: Supervision for safety  Transfers Overall transfer level: Needs assistance Equipment used: None Transfers: Sit to/from [employer] Transfers Sit to Stand: Min guard Stand pivot transfers: Min guard       General transfer comment: Patient able to transfer bed <> BSC with min guard assist.  Min guard assist for safety.  Ambulation/Gait Ambulation/Gait assistance: Min guard;Min assist Ambulation Distance (Feet): 400 Feet Assistive device: None Gait Pattern/deviations: Step-through pattern;Decreased stride length;Staggering left;Staggering right Gait velocity: decreased Gait velocity interpretation: Below normal speed for age/gender General Gait Details: Patient with unsteady gait, with staggering to both sides today.  Required assist due  to loss of balance to both sides, especially when patient looks left/right during gait.     Stairs            Wheelchair Mobility    Modified Rankin (Stroke Patients Only)       Balance Overall balance assessment: Needs assistance Sitting-balance support: No upper extremity supported Sitting balance-Leahy Scale: Fair     Standing balance support: No upper extremity supported Standing balance-Leahy Scale: Fair Standing balance comment: seeking external support at times during dynamic standing balance                    Cognition Arousal/Alertness: Awake/alert Behavior During Therapy: WFL for tasks assessed/performed Overall Cognitive Status: Impaired/Different from baseline Area of Impairment: Safety/judgement;Awareness;Problem solving         Safety/Judgement: Decreased awareness of safety;Decreased awareness of deficits Awareness: Emergent Problem Solving: Slow processing;Requires verbal cues General Comments: Easily distracted.    Exercises      General Comments        Pertinent Vitals/Pain Pain Assessment: No/denies pain    Home Living                      Prior Function            PT Goals (current goals can now be found in the care plan section) Acute Rehab PT Goals Patient Stated Goal: go home Progress towards PT goals: Progressing toward goals    Frequency    Min 3X/week      PT Plan Current plan remains appropriate    Co-evaluation             End of Session Equipment Utilized During Treatment: Gait belt Activity Tolerance: Patient tolerated treatment well Patient left: in bed;with call  bell/phone within reach;with bed alarm set;with family/visitor present     Time: 6720-9198 PT Time Calculation (min) (ACUTE ONLY): 16 min  Charges:  $Gait Training: 8-22 mins                    G Codes:      Despina Pole 05-Apr-2016, 1:06 PM Carita Pian. Sanjuana Kava, [place] Pager [phone]

## 2016-03-22 NOTE — Progress Notes (Signed)
Called MRI again about the time and stated they had some emergencies, and will get pt. soon- informed them pt. still NPO and asking to drink.

## 2016-03-22 NOTE — Care Management Important Message (Signed)
Important Message  Patient Details  Name: Beverly Davis MRN: [id number] Date of Birth: May 29, 1940   Medicare Important Message Given:  Yes    Orbie Pyo 03/22/2016, 3:16 PM

## 2016-03-22 NOTE — Consult Note (Signed)
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 Weakness  . Fall  : I have liver cancer.  HPI: [name] is a very charming 75 year old white female. She is originally from [facility]. She has been in the [place] area for probably 50 years or more.  She has a history of stage I breast cancer of the right breast. This is back in 2008. She had a lumpectomy followed by radiation. She is on tamoxifen and then Arimidex.  She has a long history of NASH. She's followed by Dr. Benson Norway.  She was admitted because of hepatic encephalopathy. As part of her workup, she had an ultrasound. The ultrasound showed a suspicious liver lesion. An MRI was done. This confirmed a 3 cm lesion in segment 8. No other lesions were noted. She had advanced cirrhotic changes. She had extensive portal venous collaterals. She had mild splenomegaly.  It was not felt that a biopsy would be necessary because of the findings on the MRI along with the underlying setting of cirrhosis.  We were asked to see her.  She has no history of alcohol use. She has no exposures to hepatitis B or hepatitis C.  She did have some hepatic encephalopathy. Her ammonia today was 41. Her albumin is 3.3. She does have some thrombocytopenia secondary to the splenomegaly and to some degree cirrhosis. Her white cell count is 3.2.  She's had no obvious weight loss. Her appetite is older decrease. She says she gets full quickly.  She is with her sister. I had a nice talk with both of them. We actually had a very good prayer session. [name] actually wanted me to pray with her.  Overall, her performance status is ECOG 2-3.    Past Medical History:  Diagnosis Date  . Breast cancer ([place]) 05/2006   Right breast cancer  . Cirrhosis ([place])   . Diabetes  mellitus without complication ([place])   . Fibroids   . Hypertension   . Osteopenia   . Thrombocytopenia ([place])   . Thyroid disease   :  Past Surgical History:  Procedure Laterality Date  . ABDOMINAL HYSTERECTOMY     TAH BSO  . BREAST LUMPECTOMY WITH AXILLARY LYMPH NODE BIOPSY Right 06/25/2006  . CHOLECYSTECTOMY    . TUBAL LIGATION  1978  :   Current Facility-Administered Medications:  .  cholecalciferol (VITAMIN D) tablet 4,000 Units, 4,000 Units, Oral, BID, Ivor Costa, MD, 4,000 Units at 03/22/16 0944 .  dextromethorphan-guaiFENesin (MUCINEX DM) 30-600 MG per 12 hr tablet 1 tablet, 1 tablet, Oral, BID PRN, Ivor Costa, MD, 1 tablet at 03/21/16 0432 .  fluticasone (FLONASE) 50 MCG/ACT nasal spray 2 spray, 2 spray, Each Nare, Daily, Florencia Reasons, MD, 2 spray at 03/22/16 [phone] .  guaiFENesin (MUCINEX) 12 hr tablet 600 mg, 600 mg, Oral, BID, Florencia Reasons, MD, 600 mg at 03/22/16 0944 .  hydrocortisone (ANUSOL-HC) 2.5 % rectal cream, , Rectal, BID, Florencia Reasons, MD .  insulin aspart (novoLOG) injection 0-5 Units, 0-5 Units, Subcutaneous, QHS, Ivor Costa, MD .  insulin aspart (novoLOG) injection 0-9 Units, 0-9 Units, Subcutaneous, TID WC, Ivor Costa, MD, 2 Units at 03/22/16 1330 .  lactulose (CHRONULAC) 10 GM/15ML solution 30 g, 30 g, Oral, TID, Carol Ada, MD, 30 g at 03/22/16 0946 .  levofloxacin (LEVAQUIN) tablet 750 mg, 750  mg, Oral, Daily, Florencia Reasons, MD .  levothyroxine (SYNTHROID, LEVOTHROID) tablet 200 mcg, 200 mcg, Oral, QAC breakfast, Ripudeep K Rai, MD, 200 mcg at 03/22/16 0831 .  omega-3 acid ethyl esters (LOVAZA) capsule 1 g, 1 g, Oral, Daily, Ivor Costa, MD, 1 g at 03/22/16 0944 .  ondansetron (ZOFRAN) injection 4 mg, 4 mg, Intravenous, Q8H PRN, Ivor Costa, MD .  potassium chloride SA (K-DUR,KLOR-CON) CR tablet 40 mEq, 40 mEq, Oral, Once, Florencia Reasons, MD .  sodium bicarbonate tablet 1,300 mg, 1,300 mg, Oral, BID, Florencia Reasons, MD .  sodium chloride flush (NS) 0.9 % injection 3 mL, 3 mL, Intravenous, Q12H, Ivor Costa, MD, 3 mL at 03/22/16 0949 .  triamcinolone cream (KENALOG) 0.5 % 1 application, 1 application, Topical, BID, Ivor Costa, MD, 1 application at 42/87/68 0954 .  zolpidem (AMBIEN) tablet 5 mg, 5 mg, Oral, QHS PRN, Ivor Costa, MD:  . cholecalciferol  4,000 Units Oral BID  . fluticasone  2 spray Each Nare Daily  . guaiFENesin  600 mg Oral BID  . hydrocortisone   Rectal BID  . insulin aspart  0-5 Units Subcutaneous QHS  . insulin aspart  0-9 Units Subcutaneous TID WC  . lactulose  30 g Oral TID  . levofloxacin  750 mg Oral Daily  . levothyroxine  200 mcg Oral QAC breakfast  . omega-3 acid ethyl esters  1 g Oral Daily  . potassium chloride  40 mEq Oral Once  . sodium bicarbonate  1,300 mg Oral BID  . sodium chloride flush  3 mL Intravenous Q12H  . triamcinolone cream  1 application Topical BID  :  Allergies  Allergen Reactions  . Metformin And Related Other (See Comments)    TAKING MEDICATION CURRENTLY  . Welchol [Colesevelam Hcl] Other (See Comments)  :  Family History  Problem Relation Age of Onset  . Cancer Mother     Brain cancer  . Cancer Father     Prostate cancer  . Diabetes Sister   . Hypertension Sister   . Liver disease Brother   . Diabetes Maternal Aunt   . Heart Problems Maternal Grandfather   . Cancer Paternal Grandfather     Unknown cancer  . Diabetes Sister   :  Social History   Social History  . Marital status: Married    Spouse name: N/A  . Number of children: N/A  . Years of education: N/A   Occupational History  . Not on file.   Social History Main Topics  . Smoking status: Never Smoker  . Smokeless tobacco: Never Used  . Alcohol use No  . Drug use: No  . Sexual activity: Yes    Birth control/ protection: Post-menopausal, Surgical     Comment: Hysterectomy   Other Topics Concern  . Not on file   Social History Narrative  . No narrative on file  :  Pertinent items are noted in HPI.  Exam: Patient Vitals for the past 24 hrs:  BP  Temp Temp src Pulse Resp SpO2  03/22/16 1654 (!) 117/58 99.4 F (37.4 C) Oral 82 18 95 %  03/22/16 0932 (!) 113/55 98 F (36.7 C) Oral 83 18 93 %  03/22/16 0547 (!) 107/55 98.5 F (36.9 C) Oral 88 17 94 %  03/21/16 2121 126/60 (!) 100.4 F (38 C) Oral 99 19 95 %    As above    Recent Labs  03/21/16 0510 03/22/16 0821  WBC 3.2* 3.2*  HGB 9.7* 9.8*  HCT 27.4* 27.7*  PLT 77* 72*    Recent Labs  03/21/16 0510 03/22/16 0821  NA 132* 133*  K 2.9* 3.5  CL 109 109  CO2 15* 16*  GLUCOSE 131* 134*  BUN 8 8  CREATININE 0.69 0.70  CALCIUM 8.3* 8.3*    Blood smear review:  None  Pathology: None     Assessment and Plan:  Ms. Delamora is a very charming 75 year old white female. She has long history of NASH with cirrhosis. She now has a 3 cm lesion highly consistent with hepatocellular carcinoma.  I agree that a biopsy really is not indicated. I cannot imagine that this would be recurrent breast cancer. She had early stage disease that I would have to believe is cured.  I really believe that it is going to be the cirrhosis that will eventually cause her to pass on to [facility].  I think that this liver tumor can be treated locally with RFA. I know that there are now studies that seem to show that stereotactic radiosurgery is also effective. Intrahepatic treatments could also be considered, such as intrahepatic chemoembolization or intrahepatic radio embolization.  Again, given that this is a small tumor, I would think that RFA would be a very good idea. I would get radiology involved. I'll measure a long she will be in the hospital. I would have to believe that this all would be an outpatient procedure.  I spent about 45 minutes with Ms. Aerts in her sister. I explained my recommendations to them. I tried explaining how the procedures are done. I told her that this is typically an outpatient procedure that she would not have to be admitted. She liked that idea.  I would not  imagine that any procedure would be done for another 2 or 3 weeks at the earliest.  I will be more than happy to follow her up as an outpatient. There's really not much I need to do at this point.  Again, she is very nice. She also has advanced cirrhosis given that she had some hepatic encephalopathy.  Lattie Haw, MD

## 2016-03-22 NOTE — Progress Notes (Signed)
Pt. NPO for MRI of the liver; husband and son Andris Baumann with questions about Dr. Benson Norway seeing pt; husband forget some of what Dr. said; updated family.

## 2016-03-22 NOTE — Progress Notes (Signed)
[facility]                                                                              Patient Demographics  Beverly Davis, is a 75 y.o. female, DOB - April 06, 1940, MHD:[id number]  Admit date - 03/18/2016   Admitting Physician Ivor Costa, MD  Outpatient Primary MD for the patient is Alesia Richards, MD  Outpatient specialists:   LOS - 3  days    Chief Complaint  Patient presents with  . Weakness  . Fall       Brief summary   Patient is a 75 year old female with liver cirrhosis, diabetes mellitus, hypothyroidism, breast cancer (s/p of right lumpectomy,s/p of radiation therapy, no chemotherapy 2008), osteoporosis, chronic thrombocytopenia, who presents with altered mental status and multiple falls. She was found to have ammonia level of 86, abnormal liver function with AST 60, ALT 44, total bilirubin 1.6.  Assessment & Plan    Hepatic encephalopathy ([place]):  - Underlying history of liver cirrhosis, AMS, elevated ammonia level and asterixis, consistent with hepatic encephalopathy. No focal neurological deficits  - she is started on  lactulose , iv rocephin empirically, albumin,  blood culture in process - denies abdominal pain, she is less confused, ammonia down, continue titrate lactulose with goal 3-4 bm daily. GI Dr Benson Norway input appreciated  Liver cirrhosis:  Patient is not drinking alcohol. She had hepatic panel on 01/31/16 which showed negative hepatitis C and B, but with positive hepatitis A antibody. Child-Pugh score 8-->class B. Now has hepatic encephalopathy. -per GI Dr Benson Norway note, patient has NASH cirrhosis -Avoid Tylenol - liver ultrasound with liver masses, MRI liver consistence with hepatocellular carcinoma,  -GI Dr Benson Norway consulted who recommended oncology consult,  -Baseline cea/afp in process.  Fall: Most likely due to altered mental status. CT head is negative for acute intracranial abnormalities. -PT OT evaluation, likely need home  health   Thrombocytopenia: plt 116 in 12/2015, plt 84 on admission, today is 75 No hypoxia, no edema, thrombocytopenia likely due to cirrhosis. She has mild splenomegaly. She does not appear to have any significant infection,  B12/folate wnl   Leukopenia: appear to be chronic,  Hematology oncology Dr Marin Olp consulted  Hypokalemia/hypomagnesemia: replace k and mag  Metabolic acidosis: sodium bicarb supplement  Hypothyroidism: Last TSH was 0.01 on 01/31/16  -TSH 0.026, too suppressed, decrease Synthroid dose, 233mg-> 2071m daily   DM-II: Last A1c 6.4 on 12/27/15, well controled. Patient is taking metformin, glipizide and tradjenda at home. -Placed on sliding scale insulin  Possible Sepsis? From acute sinusitis?:   she did have elevated lactate, tachycardia, neutropenia on admission. Her elevated lactate could also be partially from metformin use , dehydration and cirrhosis  abdominal usKoreaid not mention ascites  acute sinusitis as shown by CT head. -Lactic acid improving 2.1, pro calcitonin 1.46,  - she is treated with IV Rocephin initially, change to levaquin on 12/29 -continue flonase, mucinex    Code Status:Full code  DVT Prophylaxis:   SCD's Family Communication: patient in room, son ([phone]) updated over the phone with patient's permission   Disposition Plan: home with home health in 1-2  days, will need gi and oncology follow up after discharge  Time Spent in minutes   35 minutes  Procedures:  None  Consultants:   GI Dr hung Hematology/oncology Dr Marin Olp  Antimicrobials:   IV Rocephin 12/26 to 12/29  levaquin from 12/29    Medications  Scheduled Meds: . cefTRIAXone (ROCEPHIN)  IV  1 g Intravenous QHS  . cholecalciferol  4,000 Units Oral BID  . fluticasone  2 spray Each Nare Daily  . guaiFENesin  600 mg Oral BID  . hydrocortisone   Rectal BID  . insulin aspart  0-5 Units Subcutaneous QHS  . insulin aspart  0-9 Units Subcutaneous TID WC  .  lactulose  30 g Oral TID  . levothyroxine  200 mcg Oral QAC breakfast  . omega-3 acid ethyl esters  1 g Oral Daily  . potassium chloride  40 mEq Oral Once  . sodium bicarbonate  650 mg Oral BID  . sodium chloride flush  3 mL Intravenous Q12H  . triamcinolone cream  1 application Topical BID   Continuous Infusions:  PRN Meds:.dextromethorphan-guaiFENesin, ondansetron, zolpidem   Antibiotics   Anti-infectives    Start     Dose/Rate Route Frequency Ordered Stop   03/19/16 0300  cefTRIAXone (ROCEPHIN) 1 g in dextrose 5 % 50 mL IVPB     1 g 100 mL/hr over 30 Minutes Intravenous Daily at bedtime 03/19/16 0019          Subjective:   Beverly Davis  She has one time mild fever of 100.4 last night, report less cough, no headache, does has nasal congestion, report some nose bleed while blowing her nose she is more alert and  less confused, she is oriented x3, but still has poor memory. Multiple family member in room  Objective:   Vitals:   03/21/16 1707 03/21/16 2121 03/22/16 0547 03/22/16 0932  BP: (!) 138/59 126/60 (!) 107/55 (!) 113/55  Pulse: 89 99 88 83  Resp: 20 19 17 18   Temp: 99.7 F (37.6 C) (!) 100.4 F (38 C) 98.5 F (36.9 C) 98 F (36.7 C)  TempSrc: Oral Oral Oral Oral  SpO2: 95% 95% 94% 93%  Weight:      Height:        Intake/Output Summary (Last 24 hours) at 03/22/16 1454 Last data filed at 03/22/16 0900  Gross per 24 hour  Intake              615 ml  Output                0 ml  Net              615 ml     Wt Readings from Last 3 Encounters:  03/20/16 64.9 kg (143 lb 1.6 oz)  12/27/15 65.8 kg (145 lb)  09/05/15 67.8 kg (149 lb 6.4 oz)     Exam  General:  Much more alert, less confused, aaox3.  HEENT:  unremarkable  Cardiovascular: S1 S2 auscultated, no rubs, murmurs or gallops. Regular rate and rhythm.  Respiratory: Clear to auscultation bilaterally, no wheezing, rales or rhonchi  Gastrointestinal: protuberant , Soft, nontender, + bowel  sounds  Ext: no cyanosis clubbing or edema, Asterixis   Neuro: less confused. Strength 5/5 upper and lower extremities bilaterally, does has impaired short term memory  Skin: No rashes  Psych: pleasant    Data Reviewed:  I have personally reviewed following labs and imaging studies  Micro Results Recent Results (from the past  240 hour(s))  Culture, blood (x 2)     Status: None (Preliminary result)   Collection Time: 03/19/16 12:55 AM  Result Value Ref Range Status   Specimen Description BLOOD RIGHT ARM  Final   Special Requests BOTTLES DRAWN AEROBIC AND ANAEROBIC 10ML  Final   Culture NO GROWTH 3 DAYS  Final   Report Status PENDING  Incomplete  Culture, blood (x 2)     Status: None (Preliminary result)   Collection Time: 03/19/16  1:05 AM  Result Value Ref Range Status   Specimen Description BLOOD RIGHT FOREARM  Final   Special Requests BOTTLES DRAWN AEROBIC AND ANAEROBIC 5ML  Final   Culture NO GROWTH 3 DAYS  Final   Report Status PENDING  Incomplete    Radiology Reports Dg Chest 2 View  Result Date: 03/18/2016 CLINICAL DATA:  Cough for 1 day EXAM: CHEST  2 VIEW COMPARISON:  05/11/2008 FINDINGS: Cardiac shadow is at the upper limits of normal in size. The lungs are well aerated bilaterally. No acute bony abnormality is seen. IMPRESSION: No active cardiopulmonary disease. Electronically Signed   By: Inez Catalina M.D.   On: 03/18/2016 22:01   Ct Head Wo Contrast  Result Date: 03/18/2016 CLINICAL DATA:  Tremors, altered mental status and gait abnormality EXAM: CT HEAD WITHOUT CONTRAST TECHNIQUE: Contiguous axial images were obtained from the base of the skull through the vertex without intravenous contrast. COMPARISON:  None. FINDINGS: Brain: No mass lesion, intraparenchymal hemorrhage or extra-axial collection. No evidence of acute cortical infarct. There is periventricular hypoattenuation compatible with chronic microvascular disease. Vascular: No hyperdense vessel or  unexpected calcification. Skull: Normal visualized skull base, calvarium and extracranial soft tissues. Sinuses/Orbits: There are frothy secretions and mucosal thickening within the left frontal sinus. Normal orbits. IMPRESSION: 1. No acute intracranial abnormality. 2. Frothy secretions within the left frontal sinus may indicate acute sinusitis. Electronically Signed   By: Ulyses Jarred M.D.   On: 03/18/2016 22:27   Mr Liver W Wo Contrast  Result Date: 03/22/2016 CLINICAL DATA:  Evaluate liver lesions seen on recent ultrasound examination. History of cirrhosis. EXAM: MRI ABDOMEN WITHOUT AND WITH CONTRAST TECHNIQUE: Multiplanar multisequence MR imaging of the abdomen was performed both before and after the administration of intravenous contrast. CONTRAST:  32m MULTIHANCE GADOBENATE DIMEGLUMINE 529 MG/ML IV SOLN COMPARISON:  Ultrasound 03/21/2016 FINDINGS: Lower chest: Small right pleural effusion with fairly extensive airspace process suspicious for pneumonia. This appears to be new since the prior chest x-ray. The heart is normal in size. No pericardial effusion. Hepatobiliary: Advanced cirrhotic changes involving the liver. Marked irregularity of the liver contour, increased caudate to right lobe ratio, dilated hepatic fissures, portal venous hypertension and portal venous collaterals. Imaging is limited due to significant patient motion. However, there is a 3 cm segment 8 liver lesion which shows early arterial phase enhancement and is consistent with hepatocellular carcinoma. I do not see any other obvious hepatic lesions or dysplastic nodules but imaging is quite limited. The portal and hepatic veins are patent. Pancreas:  No mass, inflammation or ductal dilatation. Spleen: Mild splenomegaly. No focal lesions. Massive perisplenic collateral vessels and a large splenorenal shunt. Adrenals/Urinary Tract: The adrenal glands and kidneys are grossly normal. A small left renal cyst is noted. Stomach/Bowel:  Visualized portions within the abdomen are unremarkable. Vascular/Lymphatic: The aorta and branch vessels are patent. The major venous structures are patent. Extensive portal venous collaterals with massive perisplenic collateral vessels an a splenorenal shunt. No obvious enlarged periportal, celiac axis  or upper abdominal lymph nodes. Other:  No ascites or abdominal wall hernia. Musculoskeletal: No significant bony findings. IMPRESSION: 1. 3 cm segment 8 liver lesion with early arterial phase enhancement and rapid washout consistent with hepatocellular carcinoma (LR5B). No findings for metastatic adenopathy or portal vein invasion. 2. Advanced cirrhotic changes involving the liver with portal venous hypertension, portal venous collaterals, massive perisplenic collaterals and splenorenal shunt. 3. Mild splenomegaly but no ascites. Electronically Signed   By: Marijo Sanes M.D.   On: 03/22/2016 09:23   US Abdomen Limited Ruq  Result Date: 03/21/2016 CLINICAL DATA:  Hepatic cirrhosis EXAM: US ABDOMEN LIMITED - RIGHT UPPER QUADRANT COMPARISON:  Aug 02, 2011 FINDINGS: Gallbladder: Surgically absent. Common bile duct: Diameter: 4 mm. No intrahepatic or extrahepatic biliary duct dilatation. Liver: Liver has a coarsened echotexture with overall increased echogenicity consistent with known underlying cirrhosis. The liver contour is nodular, again consistent with cirrhosis. There is a hyperechoic mass arising in the anterior segment of the right lobe measuring 4.0 x 2.8 x 4.0 cm. A much smaller mass is noted near the gallbladder fossa region measuring 0.9 x 0.6 x 0.8 cm. No other focal liver lesions are identified. IMPRESSION: Underlying hepatic cirrhosis. Mass lesions in the liver as noted above, largest measuring 4.0 x 2.8 x 4.0 cm. The possibility of hepatocellular carcinoma must be of concern in this circumstance. Atypical appearance of dysplasia and metastatic disease are differential considerations. Further  evaluation is warranted. Advise precontrast and serial post-contrast MR or CT of the liver to further assess in this regard. Gallbladder absent.  No biliary duct dilatation evident. These results will be called to the ordering clinician or representative by the Radiologist Assistant, and communication documented in the PACS or zVision Dashboard. Electronically Signed   By: Lowella Grip III M.D.   On: 03/21/2016 08:35    Lab Data:  CBC:  Recent Labs Lab 03/18/16 2114 03/19/16 0523 03/21/16 0510 03/22/16 0821  WBC 3.1* 3.0* 3.2* 3.2*  NEUTROABS 2.7  --   --   --   HGB 11.6* 10.6* 9.7* 9.8*  HCT 34.0* 31.3* 27.4* 27.7*  MCV 103.3* 104.3* 101.1* 98.9  PLT 84* 75* 77* 72*   Basic Metabolic Panel:  Recent Labs Lab 03/18/16 2114 03/19/16 0523 03/21/16 0510 03/22/16 0821  NA 133* 135 132* 133*  K 3.8 3.7 2.9* 3.5  CL 107 109 109 109  CO2 21* 16* 15* 16*  GLUCOSE 147* 190* 131* 134*  BUN 9 8 8 8   CREATININE 0.70 0.72 0.69 0.70  CALCIUM 9.0 8.7* 8.3* 8.3*  MG  --   --  1.4* 1.9   GFR: Estimated Creatinine Clearance: 54.5 mL/min (by C-G formula based on SCr of 0.7 mg/dL). Liver Function Tests:  Recent Labs Lab 03/18/16 2114 03/19/16 0523 03/21/16 0510 03/22/16 0821  AST 60* 53* 50* 44*  ALT 44 40 34 31  ALKPHOS 74 57 44 44  BILITOT 1.2 1.1 1.6* 2.1*  PROT 6.2* 6.1* 5.9* 6.0*  ALBUMIN 3.3* 3.5 3.6 3.3*   No results for input(s): LIPASE, AMYLASE in the last 168 hours.  Recent Labs Lab 03/18/16 2114 03/21/16 0510 03/22/16 0821  AMMONIA 86* 54* 41*   Coagulation Profile:  Recent Labs Lab 03/18/16 2114  INR 1.26   Cardiac Enzymes: No results for input(s): CKTOTAL, CKMB, CKMBINDEX, TROPONINI in the last 168 hours. BNP (last 3 results) No results for input(s): PROBNP in the last 8760 hours. HbA1C: No results for input(s): HGBA1C in the last 72  hours. CBG:  Recent Labs Lab 03/21/16 1149 03/21/16 1705 03/21/16 2119 03/22/16 0836 03/22/16 1225    GLUCAP 219* 151* 137* 125* 182*   Lipid Profile: No results for input(s): CHOL, HDL, LDLCALC, TRIG, CHOLHDL, LDLDIRECT in the last 72 hours. Thyroid Function Tests: No results for input(s): TSH, T4TOTAL, FREET4, T3FREE, THYROIDAB in the last 72 hours. Anemia Panel:  Recent Labs  03/21/16 0510  VITAMINB12 417  FOLATE 15.7   Urine analysis:    Component Value Date/Time   COLORURINE YELLOW 03/18/2016 2235   APPEARANCEUR HAZY (A) 03/18/2016 2235   LABSPEC 1.025 03/18/2016 2235   PHURINE 5.0 03/18/2016 2235   GLUCOSEU NEGATIVE 03/18/2016 2235   HGBUR SMALL (A) 03/18/2016 2235   BILIRUBINUR NEGATIVE 03/18/2016 2235   KETONESUR 20 (A) 03/18/2016 2235   PROTEINUR NEGATIVE 03/18/2016 2235   UROBILINOGEN 1 07/07/2013 1507   NITRITE NEGATIVE 03/18/2016 2235   LEUKOCYTESUR NEGATIVE 03/18/2016 2235     Merranda Bolls M.D. PhD [facility] 03/22/2016, 2:54 PM   Between 7am to 7pm - call Pager - [phone]  After 7pm go to [email or web address] - password [email or web address]  Call night coverage person covering after 7pm

## 2016-03-22 NOTE — Progress Notes (Signed)
Called MRI re: time pt. to come for test and told around 85m- have meds to give pt.

## 2016-03-23 ENCOUNTER — Other Ambulatory Visit: Payer: Self-pay | Admitting: Radiology

## 2016-03-23 ENCOUNTER — Other Ambulatory Visit: Payer: Self-pay | Admitting: Internal Medicine

## 2016-03-23 DIAGNOSIS — R160 Hepatomegaly, not elsewhere classified: Secondary | ICD-10-CM

## 2016-03-23 DIAGNOSIS — K7290 Hepatic failure, unspecified without coma: Secondary | ICD-10-CM

## 2016-03-23 DIAGNOSIS — C220 Liver cell carcinoma: Secondary | ICD-10-CM

## 2016-03-23 DIAGNOSIS — K7469 Other cirrhosis of liver: Secondary | ICD-10-CM

## 2016-03-23 LAB — BASIC METABOLIC PANEL
ANION GAP: 8 (ref 5–15)
BUN: 9 mg/dL (ref 6–20)
CALCIUM: 8.4 mg/dL — AB (ref 8.9–10.3)
CO2: 17 mmol/L — AB (ref 22–32)
CREATININE: 0.56 mg/dL (ref 0.44–1.00)
Chloride: 110 mmol/L (ref 101–111)
Glucose, Bld: 118 mg/dL — ABNORMAL HIGH (ref 65–99)
Potassium: 3.2 mmol/L — ABNORMAL LOW (ref 3.5–5.1)
SODIUM: 135 mmol/L (ref 135–145)

## 2016-03-23 LAB — FERRITIN: Ferritin: 119 ng/mL (ref 11–307)

## 2016-03-23 LAB — AFP TUMOR MARKER: AFP-Tumor Marker: 415.9 ng/mL — ABNORMAL HIGH (ref 0.0–8.3)

## 2016-03-23 LAB — GLUCOSE, CAPILLARY
GLUCOSE-CAPILLARY: 154 mg/dL — AB (ref 65–99)
Glucose-Capillary: 113 mg/dL — ABNORMAL HIGH (ref 65–99)

## 2016-03-23 LAB — IRON AND TIBC
IRON: 36 ug/dL (ref 28–170)
Saturation Ratios: 15 % (ref 10.4–31.8)
TIBC: 234 ug/dL — ABNORMAL LOW (ref 250–450)
UIBC: 198 ug/dL

## 2016-03-23 LAB — CEA: CEA: 1.5 ng/mL (ref 0.0–4.7)

## 2016-03-23 LAB — MAGNESIUM: Magnesium: 1.7 mg/dL (ref 1.7–2.4)

## 2016-03-23 LAB — AMMONIA: AMMONIA: 44 umol/L — AB (ref 9–35)

## 2016-03-23 MED ORDER — FLUTICASONE PROPIONATE 50 MCG/ACT NA SUSP: 2.0000 | Freq: Every day | NASAL | 0 refills | Status: DC

## 2016-03-23 MED ORDER — POTASSIUM CHLORIDE CRYS ER 20 MEQ PO TBCR
40.0000 meq | EXTENDED_RELEASE_TABLET | Freq: Once | ORAL | Status: AC
  Administered 2016-03-23: 40 meq via ORAL
  Filled 2016-03-23: qty 2

## 2016-03-23 MED ORDER — LEVOFLOXACIN 750 MG PO TABS: 750.0000 mg | ORAL_TABLET | Freq: Every day | ORAL | 0 refills | Status: DC

## 2016-03-23 MED ORDER — SODIUM BICARBONATE 650 MG PO TABS: 1300.0000 mg | ORAL_TABLET | Freq: Every day | ORAL | 0 refills | Status: DC

## 2016-03-23 MED ORDER — LACTULOSE 10 GM/15ML PO SOLN: 20.0000 g | Freq: Three times a day (TID) | ORAL | 0 refills | Status: DC

## 2016-03-23 MED ORDER — GUAIFENESIN ER 600 MG PO TB12: 600.0000 mg | ORAL_TABLET | Freq: Two times a day (BID) | ORAL | 0 refills | Status: DC

## 2016-03-23 MED ORDER — LACTULOSE 20 G PO PACK: 20.0000 g | PACK | Freq: Three times a day (TID) | ORAL | 0 refills | Status: DC

## 2016-03-23 NOTE — Progress Notes (Signed)
[facility] Progress Note  Chief Complaint:   Cirrhosis, liver mass  Subjective: feels okay. Eating well.   Objective:  Vital signs in last 24 hours: Temp:  [98 F (36.7 C)-99.6 F (37.6 C)] 98 F (36.7 C) (12/30 0848) Pulse Rate:  [80-83] 80 (12/30 0848) Resp:  [17-18] 18 (12/30 0848) BP: (103-117)/(45-60) 103/55 (12/30 0848) SpO2:  [93 %-97 %] 97 % (12/30 0848) Last BM Date: 03/22/16 General:   Alert, well-developed,  White female in NAD EENT:  Normal hearing, non icteric sclera, conjunctive pink.  Heart:  Regular rate and rhythm; no murmurs. no lower extremity edema Abdomen:  Soft, nondistended, nontender.  Normal bowel sounds, no masses felt. No hepatomegaly.    Neurologic:  Alert and  Oriented to person, place, time. Grossly normal neurologically. No asterixis Psych:  Alert and cooperative. Normal mood and affect.   Intake/Output from previous day: 12/29 0701 - 12/30 0700 In: 1080 [P.O.:1080] Out: 500 [Urine:500] Intake/Output this shift: No intake/output data recorded.  Lab Results:  Recent Labs  03/21/16 0510 03/22/16 0821  WBC 3.2* 3.2*  HGB 9.7* 9.8*  HCT 27.4* 27.7*  PLT 77* 72*   BMET  Recent Labs  03/21/16 0510 03/22/16 0821 03/23/16 0622  NA 132* 133* 135  K 2.9* 3.5 3.2*  CL 109 109 110  CO2 15* 16* 17*  GLUCOSE 131* 134* 118*  BUN 8 8 9   CREATININE 0.69 0.70 0.56  CALCIUM 8.3* 8.3* 8.4*   LFT  Recent Labs  03/22/16 0821  PROT 6.0*  ALBUMIN 3.3*  AST 44*  ALT 31  ALKPHOS 44  BILITOT 2.1*    Mr Liver W Wo Contrast  Result Date: 03/22/2016 CLINICAL DATA:  Evaluate liver lesions seen on recent ultrasound examination. History of cirrhosis. EXAM: MRI ABDOMEN WITHOUT AND WITH CONTRAST TECHNIQUE: Multiplanar multisequence MR imaging of the abdomen was performed both before and after the administration of intravenous contrast. CONTRAST:  86m MULTIHANCE GADOBENATE DIMEGLUMINE 529 MG/ML IV SOLN COMPARISON:   Ultrasound 03/21/2016 FINDINGS: Lower chest: Small right pleural effusion with fairly extensive airspace process suspicious for pneumonia. This appears to be new since the prior chest x-ray. The heart is normal in size. No pericardial effusion. Hepatobiliary: Advanced cirrhotic changes involving the liver. Marked irregularity of the liver contour, increased caudate to right lobe ratio, dilated hepatic fissures, portal venous hypertension and portal venous collaterals. Imaging is limited due to significant patient motion. However, there is a 3 cm segment 8 liver lesion which shows early arterial phase enhancement and is consistent with hepatocellular carcinoma. I do not see any other obvious hepatic lesions or dysplastic nodules but imaging is quite limited. The portal and hepatic veins are patent. Pancreas:  No mass, inflammation or ductal dilatation. Spleen: Mild splenomegaly. No focal lesions. Massive perisplenic collateral vessels and a large splenorenal shunt. Adrenals/Urinary Tract: The adrenal glands and kidneys are grossly normal. A small left renal cyst is noted. Stomach/Bowel: Visualized portions within the abdomen are unremarkable. Vascular/Lymphatic: The aorta and branch vessels are patent. The major venous structures are patent. Extensive portal venous collaterals with massive perisplenic collateral vessels an a splenorenal shunt. No obvious enlarged periportal, celiac axis or upper abdominal lymph nodes. Other:  No ascites or abdominal wall hernia. Musculoskeletal: No significant bony findings. IMPRESSION: 1. 3 cm segment 8 liver lesion with early arterial phase enhancement and rapid washout consistent with hepatocellular carcinoma (LR5B). No findings for metastatic adenopathy or portal vein invasion. 2. Advanced cirrhotic changes involving the  liver with portal venous hypertension, portal venous collaterals, massive perisplenic collaterals and splenorenal shunt. 3. Mild splenomegaly but no ascites.  Electronically Signed   By: Marijo Sanes M.D.   On: 03/22/2016 09:23    Assessment / Plan:  Covering for Carol Ada, MD   75 yo female with decompensated NASH cirrhosis admitted with hepatic encephalopathy. First episode of decompensation, possibly due to [facility] . MRI--> 3 cm mass in liver -Oncology evaluated, feels candidate for RFA or at least intrahepatic chemoembolization or radio embolization.  -continue laculose 30 grams TID. I explained that 2-3 loose BM /day is goal. Anything above that then she can discuss dose reduction with Dr. Benson Norway.  -Hopefully home soon as her encephalopathy is resolving.     LOS: 4 days   Tye Savoy  03/23/2016, 9:46 AM  Pager number [phone]

## 2016-03-23 NOTE — Progress Notes (Signed)
Patient discharge teaching given, including activity, diet, follow-up appoints, and medications. Patient verbalized understanding of all discharge instructions. IV access was d/c'd. Vitals are stable. Skin is intact except as charted in most recent assessments. Pt to be escorted out by NT, to be driven home by family.  Jillyn Ledger, MBA, BSN, RN

## 2016-03-23 NOTE — Discharge Summary (Signed)
Discharge Summary  Beverly Davis KDX:[id number] DOB: Aug 22, 1940  PCP: Alesia Richards, MD  Admit date: 03/18/2016 Discharge date: 03/23/2016  Time spent: <29mns  Recommendations for Outpatient Follow-up:  1. F/u with PMD within a week  for hospital discharge follow up, repeat cbc/bmp at follow up, pmd to repeat tsh in 4-6 weeks 2. F/u with interventional radiology [phone] 3. F/u with oncology Dr EMarin Olp4. F/u with GI Dr HBenson Norway Discharge Diagnoses:  Active Hospital Problems   Diagnosis Date Noted  . Hepatic encephalopathy ([facility] [name] 03/18/2016  . Sepsis ([facility] 03/19/2016  . Diabetes mellitus without complication ([place] 197/67/3419 . Fall 03/18/2016  . Thrombocytopenia ([facility]   . Hypothyroidism 05/18/2014  . Osteoporosis     Resolved Hospital Problems   Diagnosis Date Noted Date Resolved  No resolved problems to display.    Discharge Condition: stable  Diet recommendation: heart healthy/carb modified  Filed Weights   03/18/16 2056 03/19/16 0233 03/20/16 2125  Weight: 69.4 kg (153 lb) 65.2 kg (143 lb 12.8 oz) 64.9 kg (143 lb 1.6 oz)    History of present illness:  PCP: MAlesia Richards MD   Patient coming from:  The patient is coming from home.  At baseline, pt is independent for most of ADL.   Chief Complaint: Altered mental status, hand tremor, multiple falls  HPI: Beverly NOHis a 76y.o. female with medical history significant of liver cirrhosis, diabetes mellitus, hypothyroidism, breast cancer (s/p of right lumpectomy,s/p of radiation therapy, no chemotherapy 2008), osteoporosis, chronic thrombocytopenia, who presents with altered mental status and multiple falls.  Per family, pt was noted to be confused and disoriented since yesterday. She has generalized weakness, but no unilateral weakness, vision change or hearing loss. She was noted to have mild hand tremor. She fell multiple times without significant injury. No headache or neck pain.  Patient has mild dry cough intermittently in the past 2 weeks, but no runny nose, sore throat, shortness breath or chest pain. She denies nausea, vomiting, diarrhea, abdominal pain, symptoms for UTI. Per her husband, patient could have mild abdominal pain yesterday.   ED Course: pt was found to have elevated ammonia level 86, abnormal liver function with AST 60, ALT 44, total bilirubin 1.6, pancytopenia with WBC 3.1, hemoglobin 11.6 and platelet 84, lactate 3.89, INR 1.26, PTT 31, troponin negative, UDS negative, creatinine normal, temperature 99.9, tachycardia, oxygen saturation 96% on room air, negative chest x-ray for acute abnormalities, CT head is negative for acute intracranial abnormalities, but showed possible acute sinusitis.  Hospital Course:  Principal Problem:   Hepatic encephalopathy ([facility] [name] Active Problems:   Osteoporosis   Hypothyroidism   Diabetes mellitus without complication ([facility]   Fall   Thrombocytopenia ([facility]   Sepsis ([facility]   Hepatic encephalopathy ([facility]:  - Underlying history of liver cirrhosis, AMS, elevated ammonia level and asterixis, consistent with hepatic encephalopathy. No focal neurological deficits  - she is started on  lactulose , iv rocephinempirically initially, later changed to oral levaquin,  blood culture no growth - much improved, now mental status back to baseline, aaox3, does has mild memory deficit. GI Dr HBenson Norwayinput appreciated  Liver cirrhosis/hepatocellular carcinoma Patient is not drinking alcohol. She had hepatic panel on 01/31/16 which showed negative hepatitis C and B, but with positive hepatitis A antibody. Child-Pugh score 8-->class B. Now has hepatic encephalopathy. -per GI Dr HBenson Norwaynote, patient has NASH cirrhosis -Avoid Tylenol - liver ultrasound with liver masses, MRI liver consistence with hepatocellular carcinoma, -CEA  wnl, AFP elevated at 415  -GI Dr Benson [place], oncology Dr Marin Olp input appreciated, IR consulted for possible RFA - per IR  [name] outpatient follow up with IR clinic will be arranged.   Fall: Most likely due to altered mental status. CT head is negative for acute intracranial abnormalities. -PT OT evaluation,  home health   Thrombocytopenia: plt 116 in 12/2015, plt 84 on admission, today is 75 No hypoxia, no edema, thrombocytopenia likely due to cirrhosis. She has mild splenomegaly. She does not appear to have any significant infection,  B12/folate wnl   Leukopenia: appear to be chronic,  Hematology oncology Dr Marin Olp consulted  Hypokalemia/hypomagnesemia: replace k and mag  Metabolic acidosis: sodium bicarb supplement  Hypothyroidism: Last TSH was0.01 on 01/31/16 -TSH 0.026, too suppressed, decrease Synthroid dose, 237mg-> 2050m daily   noninsulin dependent DM-II:Last A1c 6.4 on 12/27/15, well controled.  Resume home medsmetformin, glipizide and tradjendaat discharge   PossibleSepsis? From acute sinusitis?: she did have elevated lactate (3.89), tachycardia, neutropenia on admission. Her elevated lactate could also be partially from metformin use , dehydration and cirrhosis  abdominal usKoreaid not mention ascites  acute sinusitis as shown by CT head. -Lactic acid improving 2.1, pro calcitonin 1.46,  - she is treated with IV Rocephin initially, change to levaquin on 12/29 -continue flonase, mucinex    Code Status:Full code  DVT Prophylaxis:   SCD's Family Communication: patient and family updated daily in room   Disposition Plan: home with home health    Procedures:  None  Consultants:   GI Dr hung Hematology/oncology Dr EnMarin OlpR  Antimicrobials:   IV Rocephin 12/26 to 12/29  levaquin from 12/29    Discharge Exam: BP (!) 103/55 (BP Location: Left Arm)   Pulse 80   Temp 98 F (36.7 C) (Oral)   Resp 18   Ht 5' 2"  (1.575 m)   Wt 64.9 kg (143 lb 1.6 oz)   SpO2 97%   BMI 26.17 kg/m    General: alert,  Confusion has resolved, husband  states patient now is back to baseline, aaox3.  HEENT:  unremarkable  Cardiovascular: S1 S2 auscultated, no rubs, murmurs or gallops. Regular rate and rhythm.  Respiratory: Clear to auscultation bilaterally, no wheezing, rales or rhonchi  Gastrointestinal: protuberant , Soft, nontender, + bowel sounds  Ext: no cyanosis clubbing or edema, Asterixis   Neuro: aaox3, Strength 5/5 upper and lower extremities bilaterally, does has impaired short term memory  Skin: No rashes  Psych: pleasant    Discharge Instructions You were cared for by a hospitalist during your hospital stay. If you have any questions about your discharge medications or the care you received while you were in the hospital after you are discharged, you can call the unit and asked to speak with the hospitalist on call if the hospitalist that took care of you is not available. Once you are discharged, your primary care physician will handle any further medical issues. Please note that NO REFILLS for any discharge medications will be authorized once you are discharged, as it is imperative that you return to your primary care physician (or establish a relationship with a primary care physician if you do not have one) for your aftercare needs so that they can reassess your need for medications and monitor your lab values.  Discharge Instructions    Diet - low sodium heart healthy    Complete by:  As directed    Carb modified diet   Increase activity slowly  Complete by:  As directed      Allergies as of 03/23/2016      Reactions   Metformin And Related Other (See Comments)   TAKING MEDICATION CURRENTLY   Welchol [colesevelam Hcl] Other (See Comments)      Medication List    STOP taking these medications   metFORMIN 500 MG 24 hr tablet Commonly known as:  GLUCOPHAGE-XR     TAKE these medications   alendronate 70 MG tablet Commonly known as:  FOSAMAX Take 1 tablet (70 mg total) by mouth once a week. What  changed:  when to take this   fluticasone 50 MCG/ACT nasal spray Commonly known as:  FLONASE Place 2 sprays into both nostrils daily. Start taking on:  03/24/2016   glipiZIDE 5 MG tablet Commonly known as:  GLUCOTROL TAKE ONE TABLET BY MOUTH THREE TIMES DAILY BEFORE MEAL(S). What changed:  how much to take  how to take this  when to take this  additional instructions   guaiFENesin 600 MG 12 hr tablet Commonly known as:  MUCINEX Take 1 tablet (600 mg total) by mouth 2 (two) times daily.   lactulose 10 GM/15ML solution Commonly known as:  CHRONULAC Take 30 mLs (20 g total) by mouth 3 (three) times daily.   levofloxacin 750 MG tablet Commonly known as:  LEVAQUIN Take 1 tablet (750 mg total) by mouth daily. Start taking on:  03/24/2016   levothyroxine 150 MCG tablet Commonly known as:  SYNTHROID, LEVOTHROID TAKE ONE TO ONE & ONE-HALF TABLETS BY MOUTH  DAILY AS  DIRECTED. What changed:  See the new instructions.   linagliptin 5 MG Tabs tablet Commonly known as:  TRADJENTA Take 1 tablet (5 mg total) by mouth daily.   sodium bicarbonate 650 MG tablet Take 2 tablets (1,300 mg total) by mouth daily.   triamcinolone cream 0.5 % Commonly known as:  KENALOG Apply 1 application topically 2 (two) times daily. What changed:  when to take this  reasons to take this   Vitamin D 2000 units tablet Take 4,000 Units by mouth 2 (two) times daily.            Durable Medical Equipment        Start     Ordered   03/21/16 1341  For home use only DME Walker rolling  Once    Question:  Patient needs a walker to treat with the following condition  Answer:  Weakness generalized   03/21/16 1341   03/21/16 1341  For home use only DME 3 n 1  Once     03/21/16 1341     Allergies  Allergen Reactions  . Metformin And Related Other (See Comments)    TAKING MEDICATION CURRENTLY  . Welchol [Colesevelam Hcl] Other (See Comments)   Follow-up Information    HUNG,PATRICK D, MD  Follow up in 2 week(s).   Specialty:  Gastroenterology Contact information: [place], [place] [address] Ruso, MD Follow up in 1 week(s).   Specialty:  Internal Medicine Why:  hospital discharge follow up, repeat cbc/cmp /ammonia level at follow up pmd to repeat thyroid function in 4-6 weeks Contact information: [address] [place] [address] [phone]        please contact interventional radiology at [phone] to schedule appointement for ablation of hepatocellular carcinoma Follow up in 1 week(s).        [name], MD Follow up  in 1 month(s).   Specialty:  Oncology Why:  hepatocellular carcinoma Contact information: [address], [place] [address] [phone]            The results of significant diagnostics from this hospitalization (including imaging, microbiology, ancillary and laboratory) are listed below for reference.    Significant Diagnostic Studies: Dg Chest 2 View  Result Date: 03/18/2016 CLINICAL DATA:  Cough for 1 day EXAM: CHEST  2 VIEW COMPARISON:  05/11/2008 FINDINGS: Cardiac shadow is at the upper limits of normal in size. The lungs are well aerated bilaterally. No acute bony abnormality is seen. IMPRESSION: No active cardiopulmonary disease. Electronically Signed   By: Inez Catalina M.D.   On: 03/18/2016 22:01   Ct Head Wo Contrast  Result Date: 03/18/2016 CLINICAL DATA:  Tremors, altered mental status and gait abnormality EXAM: CT HEAD WITHOUT CONTRAST TECHNIQUE: Contiguous axial images were obtained from the base of the skull through the vertex without intravenous contrast. COMPARISON:  None. FINDINGS: Brain: No mass lesion, intraparenchymal hemorrhage or extra-axial collection. No evidence of acute cortical infarct. There is periventricular hypoattenuation compatible with chronic microvascular disease. Vascular: No hyperdense vessel  or unexpected calcification. Skull: Normal visualized skull base, calvarium and extracranial soft tissues. Sinuses/Orbits: There are frothy secretions and mucosal thickening within the left frontal sinus. Normal orbits. IMPRESSION: 1. No acute intracranial abnormality. 2. Frothy secretions within the left frontal sinus may indicate acute sinusitis. Electronically Signed   By: Ulyses Jarred M.D.   On: 03/18/2016 22:27   Mr Liver W Wo Contrast  Result Date: 03/22/2016 CLINICAL DATA:  Evaluate liver lesions seen on recent ultrasound examination. History of cirrhosis. EXAM: MRI ABDOMEN WITHOUT AND WITH CONTRAST TECHNIQUE: Multiplanar multisequence MR imaging of the abdomen was performed both before and after the administration of intravenous contrast. CONTRAST:  19m MULTIHANCE GADOBENATE DIMEGLUMINE 529 MG/ML IV SOLN COMPARISON:  Ultrasound 03/21/2016 FINDINGS: Lower chest: Small right pleural effusion with fairly extensive airspace process suspicious for pneumonia. This appears to be new since the prior chest x-ray. The heart is normal in size. No pericardial effusion. Hepatobiliary: Advanced cirrhotic changes involving the liver. Marked irregularity of the liver contour, increased caudate to right lobe ratio, dilated hepatic fissures, portal venous hypertension and portal venous collaterals. Imaging is limited due to significant patient motion. However, there is a 3 cm segment 8 liver lesion which shows early arterial phase enhancement and is consistent with hepatocellular carcinoma. I do not see any other obvious hepatic lesions or dysplastic nodules but imaging is quite limited. The portal and hepatic veins are patent. Pancreas:  No mass, inflammation or ductal dilatation. Spleen: Mild splenomegaly. No focal lesions. Massive perisplenic collateral vessels and a large splenorenal shunt. Adrenals/Urinary Tract: The adrenal glands and kidneys are grossly normal. A small left renal cyst is noted. Stomach/Bowel:  Visualized portions within the abdomen are unremarkable. Vascular/Lymphatic: The aorta and branch vessels are patent. The major venous structures are patent. Extensive portal venous collaterals with massive perisplenic collateral vessels an a splenorenal shunt. No obvious enlarged periportal, celiac axis or upper abdominal lymph nodes. Other:  No ascites or abdominal wall hernia. Musculoskeletal: No significant bony findings. IMPRESSION: 1. 3 cm segment 8 liver lesion with early arterial phase enhancement and rapid washout consistent with hepatocellular carcinoma (LR5B). No findings for metastatic adenopathy or portal vein invasion. 2. Advanced cirrhotic changes involving the liver with portal venous hypertension, portal venous collaterals, massive perisplenic collaterals and splenorenal shunt. 3. Mild splenomegaly but no ascites.  Electronically Signed   By: Marijo Sanes M.D.   On: 03/22/2016 09:23   US Abdomen Limited Ruq  Result Date: 03/21/2016 CLINICAL DATA:  Hepatic cirrhosis EXAM: US ABDOMEN LIMITED - RIGHT UPPER QUADRANT COMPARISON:  Aug 02, 2011 FINDINGS: Gallbladder: Surgically absent. Common bile duct: Diameter: 4 mm. No intrahepatic or extrahepatic biliary duct dilatation. Liver: Liver has a coarsened echotexture with overall increased echogenicity consistent with known underlying cirrhosis. The liver contour is nodular, again consistent with cirrhosis. There is a hyperechoic mass arising in the anterior segment of the right lobe measuring 4.0 x 2.8 x 4.0 cm. A much smaller mass is noted near the gallbladder fossa region measuring 0.9 x 0.6 x 0.8 cm. No other focal liver lesions are identified. IMPRESSION: Underlying hepatic cirrhosis. Mass lesions in the liver as noted above, largest measuring 4.0 x 2.8 x 4.0 cm. The possibility of hepatocellular carcinoma must be of concern in this circumstance. Atypical appearance of dysplasia and metastatic disease are differential considerations. Further  evaluation is warranted. Advise precontrast and serial post-contrast MR or CT of the liver to further assess in this regard. Gallbladder absent.  No biliary duct dilatation evident. These results will be called to the ordering clinician or representative by the Radiologist Assistant, and communication documented in the PACS or zVision Dashboard. Electronically Signed   By: Lowella Grip III M.D.   On: 03/21/2016 08:35    Microbiology: Recent Results (from the past 240 hour(s))  Culture, blood (x 2)     Status: None (Preliminary result)   Collection Time: 03/19/16 12:55 AM  Result Value Ref Range Status   Specimen Description BLOOD RIGHT ARM  Final   Special Requests BOTTLES DRAWN AEROBIC AND ANAEROBIC 10ML  Final   Culture NO GROWTH 4 DAYS  Final   Report Status PENDING  Incomplete  Culture, blood (x 2)     Status: None (Preliminary result)   Collection Time: 03/19/16  1:05 AM  Result Value Ref Range Status   Specimen Description BLOOD RIGHT FOREARM  Final   Special Requests BOTTLES DRAWN AEROBIC AND ANAEROBIC 5ML  Final   Culture NO GROWTH 4 DAYS  Final   Report Status PENDING  Incomplete     Labs: Basic Metabolic Panel:  Recent Labs Lab 03/18/16 2114 03/19/16 0523 03/21/16 0510 03/22/16 0821 03/23/16 0622  NA 133* 135 132* 133* 135  K 3.8 3.7 2.9* 3.5 3.2*  CL 107 109 109 109 110  CO2 21* 16* 15* 16* 17*  GLUCOSE 147* 190* 131* 134* 118*  BUN 9 8 8 8 9   CREATININE 0.70 0.72 0.69 0.70 0.56  CALCIUM 9.0 8.7* 8.3* 8.3* 8.4*  MG  --   --  1.4* 1.9 1.7   Liver Function Tests:  Recent Labs Lab 03/18/16 2114 03/19/16 0523 03/21/16 0510 03/22/16 0821  AST 60* 53* 50* 44*  ALT 44 40 34 31  ALKPHOS 74 57 44 44  BILITOT 1.2 1.1 1.6* 2.1*  PROT 6.2* 6.1* 5.9* 6.0*  ALBUMIN 3.3* 3.5 3.6 3.3*   No results for input(s): LIPASE, AMYLASE in the last 168 hours.  Recent Labs Lab 03/18/16 2114 03/21/16 0510 03/22/16 0821 03/23/16 0622  AMMONIA 86* 54* 41* 44*    CBC:  Recent Labs Lab 03/18/16 2114 03/19/16 0523 03/21/16 0510 03/22/16 0821  WBC 3.1* 3.0* 3.2* 3.2*  NEUTROABS 2.7  --   --   --   HGB 11.6* 10.6* 9.7* 9.8*  HCT 34.0* 31.3* 27.4* 27.7*  MCV 103.3*  104.3* 101.1* 98.9  PLT 84* 75* 77* 72*   Cardiac Enzymes: No results for input(s): CKTOTAL, CKMB, CKMBINDEX, TROPONINI in the last 168 hours. BNP: BNP (last 3 results) No results for input(s): BNP in the last 8760 hours.  ProBNP (last 3 results) No results for input(s): PROBNP in the last 8760 hours.  CBG:  Recent Labs Lab 03/22/16 1225 03/22/16 1652 03/22/16 2125 03/23/16 0737 03/23/16 1159  GLUCAP 182* 149* 131* 113* 154*       Signed:  Clydette Privitera MD, PhD  [facility] 03/23/2016, 2:14 PM

## 2016-03-24 LAB — CULTURE, BLOOD (ROUTINE X 2)
CULTURE: NO GROWTH
CULTURE: NO GROWTH

## 2016-03-26 LAB — AFP TUMOR MARKER: AFP TUMOR MARKER: 352.9 ng/mL — AB (ref 0.0–8.3)

## 2016-03-28 ENCOUNTER — Other Ambulatory Visit: Payer: Self-pay | Admitting: Interventional Radiology

## 2016-03-28 ENCOUNTER — Ambulatory Visit
Admission: RE | Admit: 2016-03-28 | Discharge: 2016-03-28 | Disposition: A | Discharge status: Home / Self Care | Payer: Medicare HMO | Source: Ambulatory Visit | Attending: Radiology | Admitting: Radiology

## 2016-03-28 DIAGNOSIS — C220 Liver cell carcinoma: Secondary | ICD-10-CM | POA: Diagnosis not present

## 2016-03-28 HISTORY — PX: IR GENERIC HISTORICAL: IMG1180011

## 2016-03-28 NOTE — Consult Note (Signed)
Chief Complaint: Liver Tumor  Referring Physician(s): Dr. Lattie Haw  History of Present Illness: Beverly Davis is a 76 y.o. female presenting as a scheduled appointment to vascular and interventional radiology clinic her husband, for evaluation of potential targeted therapy for a newly discovered hepatocellular carcinoma.  She has had a recent hospitalization at [facility] with the admission day on Christmas secondary to an episode of hepatic encephalopathy. During the hospitalization, MRI characterized a right hepatic hepatocellular carcinoma. She has been kindly referred by Dr. Lattie Haw of oncology for evaluation.  She tells me this was her first episode of hepatic encephalopathy. She describes the episode as unsteadiness and a fall in the bathroom. Elevation of the ammonia levels during the hospitalization, and she has since recovered with supportive care.  MRI performed 03/22/2016 defines a tumor measuring 3 cm-4 cm in the right hepatic lobe, centered within segment 8.  She has a history of cirrhosis, and is followed by her gastroenterologist Dr. Carol Ada. 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  She  is an alcohol teetotaler and has no record of hepatitis infection.  Regarding her performance status, she qualifies as ECOG of 0, as, leading up to Christmas, she was able to perform all of her daily activities including cooking, cleaning, entertaining with the family, and taking care of her 2 grandsons. Before her episode of unsteadiness/hospitalization, she has had no symptoms.  With her recent lab values, her meld is calculated at 14. She has a child Pugh class B (considering grade 2 encephalopathy), with 8 points.  Given the imaging and clinical status, her [place] is stage A.   I had a lengthy discussion with her regarding treatment strategies of hepatocellular carcinoma. I emphasized that the standard of care is surgery, which, if not a transplant candidate, and include surgical  resection. Alternative strategy for stage a includes percutaneous ablation. I did discuss with her that there are multiple studies that have demonstrated excellent survival with both surgery and ablation.  My sense is that, if not a surgical candidate, she would be an excellent candidate for a combined therapy with preoperative embolization for a targeted radiofrequency ablation given the size of 3 cm-4 cm, and the location. The majority of our discussion was regarding the logistics of a combined therapy with catheter directed embolization on one day and then a follow-up date for radiofrequency ablation. I gave her a lengthy discussion regarding the goals of the therapy, expectations for the treatment, and a risk benefit analysis.  Specific risks discussed for arterial treatment and included bleeding, infection, arterial injury/dissection, contrast reaction, kidney injury nontarget embolization, postembolization syndrome, gastrointestinal ulcer, cardiopulmonary collapse, death. Specific risks discussed for radiofrequency ablation included bleeding, infection, liver injury, adjacent organ injury, postembolization syndrome, biloma/abscess, need for further procedure, skin injury/diaphragm injury, nerve injury with symptoms, cardiopulmonary collapse, death.  I also had a brief discussion about transplant, for which she is likely not a candidate though it may suited her to have a discussion with our transplant coordination team. 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 This can be a discussion for a future date.    Past Medical History:  Diagnosis Date  . Breast cancer ([place]) 05/2006   Right breast cancer  . Cirrhosis ([place])   . Diabetes mellitus without complication ([place])   . Fibroids   . Hypertension   . Osteopenia   .  Thrombocytopenia ([place])   . Thyroid  disease     Past Surgical History:  Procedure Laterality Date  . ABDOMINAL HYSTERECTOMY     TAH BSO  . BREAST LUMPECTOMY WITH AXILLARY LYMPH NODE BIOPSY Right 06/25/2006  . CHOLECYSTECTOMY    . TUBAL LIGATION  1978    Allergies: Metformin and related and Welchol [colesevelam hcl]  Medications: Prior to Admission medications   Medication Sig Start Date End Date Taking? Authorizing Provider  alendronate (FOSAMAX) 70 MG tablet Take 1 tablet (70 mg total) by mouth once a week. Patient taking differently: Take 70 mg by mouth every Saturday.  12/27/15  Yes Vicie Mutters, PA-C  Cholecalciferol (VITAMIN D) 2000 UNITS tablet Take 4,000 Units by mouth 2 (two) times daily.   Yes Historical Provider, MD  fluticasone (FLONASE) 50 MCG/ACT nasal spray Place 2 sprays into both nostrils daily. 03/24/16  Yes Florencia Reasons, MD  glipiZIDE (GLUCOTROL) 5 MG tablet TAKE ONE TABLET BY MOUTH THREE TIMES DAILY BEFORE MEAL(S). Patient taking differently: Take 5 mg by mouth 2 (two) times daily before a meal.  02/22/16  Yes Unk Pinto, MD  guaiFENesin (MUCINEX) 600 MG 12 hr tablet Take 1 tablet (600 mg total) by mouth 2 (two) times daily. 03/23/16  Yes Florencia Reasons, MD  lactulose (CHRONULAC) 10 GM/15ML solution Take 30 mLs (20 g total) by mouth 3 (three) times daily. 03/23/16  Yes Florencia Reasons, MD  levothyroxine (SYNTHROID, LEVOTHROID) 150 MCG tablet TAKE ONE TO ONE & ONE-HALF TABLETS BY MOUTH ONCE DAILY AS DIRECTED 03/23/16  Yes Unk Pinto, MD  linagliptin (TRADJENTA) 5 MG TABS tablet Take 1 tablet (5 mg total) by mouth daily. 12/27/15  Yes Vicie Mutters, PA-C  sodium bicarbonate 650 MG tablet Take 2 tablets (1,300 mg total) by mouth daily. 03/23/16  Yes Florencia Reasons, MD  triamcinolone cream (KENALOG) 0.5 % Apply 1 application topically 2 (two) times daily. Patient taking differently: Apply 1 application topically 2 (two) times daily as needed (IRRITATION).  12/27/15  Yes Vicie Mutters, PA-C  levofloxacin (LEVAQUIN) 750 MG  tablet Take 1 tablet (750 mg total) by mouth daily. Patient not taking: Reported on 03/28/2016 03/24/16   Florencia Reasons, MD     Family History  Problem Relation Age of Onset  . Cancer Mother     Brain cancer  . Cancer Father     Prostate cancer  . Diabetes Sister   . Hypertension Sister   . Liver disease Brother   . Diabetes Maternal Aunt   . Heart Problems Maternal Grandfather   . Cancer Paternal Grandfather     Unknown cancer  . Diabetes Sister     Social History   Social History  . Marital status: Married    Spouse name: N/A  . Number of children: N/A  . Years of education: N/A   Social History Main Topics  . Smoking status: Never Smoker  . Smokeless tobacco: Never Used  . Alcohol use No  . Drug use: No  . Sexual activity: Yes    Birth control/ protection: Post-menopausal, Surgical     Comment: Hysterectomy   Other Topics Concern  . Not on file   Social History Narrative  . No narrative on file    ECOG Status: 0 - Asymptomatic  Review of Systems: A 12 point ROS discussed and pertinent positives are indicated in the HPI above.  All other systems are negative.  Review of Systems  Vital Signs: BP (!) 99/48 (BP Location: Left Arm, Patient Position:  Sitting, Cuff Size: Normal)   Pulse 69   Temp 98.4 F (36.9 C) (Oral)   Resp 15   Ht 5' 2"  (1.575 m)   Wt 143 lb 3.2 oz (65 kg)   SpO2 98%   BMI 26.19 kg/m   Physical Exam  Atraumatic, normocephalic, mucous membranes moist and pink. No scleral icterus or scleral injection. Wearing glasses. Conjugate gaze. Alert to person place and time. Appropriate questions and insight. Normal chest excursion was symmetric movement on inspiration and expiration. No labored breathing. Full range of motion of cervical region. Moving all 4 extremities equally and grossly with gross motor and sensory intact. Abdomen nondistended. Genitourinary deferred. No jaundiced skin, rashes, or wounds.,  Mallampati Score:      Imaging: Dg Chest 2 View  Result Date: 03/18/2016 CLINICAL DATA:  Cough for 1 day EXAM: CHEST  2 VIEW COMPARISON:  05/11/2008 FINDINGS: Cardiac shadow is at the upper limits of normal in size. The lungs are well aerated bilaterally. No acute bony abnormality is seen. IMPRESSION: No active cardiopulmonary disease. Electronically Signed   By: Inez Catalina M.D.   On: 03/18/2016 22:01   Ct Head Wo Contrast  Result Date: 03/18/2016 CLINICAL DATA:  Tremors, altered mental status and gait abnormality EXAM: CT HEAD WITHOUT CONTRAST TECHNIQUE: Contiguous axial images were obtained from the base of the skull through the vertex without intravenous contrast. COMPARISON:  None. FINDINGS: Brain: No mass lesion, intraparenchymal hemorrhage or extra-axial collection. No evidence of acute cortical infarct. There is periventricular hypoattenuation compatible with chronic microvascular disease. Vascular: No hyperdense vessel or unexpected calcification. Skull: Normal visualized skull base, calvarium and extracranial soft tissues. Sinuses/Orbits: There are frothy secretions and mucosal thickening within the left frontal sinus. Normal orbits. IMPRESSION: 1. No acute intracranial abnormality. 2. Frothy secretions within the left frontal sinus may indicate acute sinusitis. Electronically Signed   By: Ulyses Jarred M.D.   On: 03/18/2016 22:27   Mr Liver W Wo Contrast  Result Date: 03/22/2016 CLINICAL DATA:  Evaluate liver lesions seen on recent ultrasound examination. History of cirrhosis. EXAM: MRI ABDOMEN WITHOUT AND WITH CONTRAST TECHNIQUE: Multiplanar multisequence MR imaging of the abdomen was performed both before and after the administration of intravenous contrast. CONTRAST:  28m MULTIHANCE GADOBENATE DIMEGLUMINE 529 MG/ML IV SOLN COMPARISON:  Ultrasound 03/21/2016 FINDINGS: Lower chest: Small right pleural effusion with fairly extensive airspace process suspicious for pneumonia. This appears to be new since the  prior chest x-ray. The heart is normal in size. No pericardial effusion. Hepatobiliary: Advanced cirrhotic changes involving the liver. Marked irregularity of the liver contour, increased caudate to right lobe ratio, dilated hepatic fissures, portal venous hypertension and portal venous collaterals. Imaging is limited due to significant patient motion. However, there is a 3 cm segment 8 liver lesion which shows early arterial phase enhancement and is consistent with hepatocellular carcinoma. I do not see any other obvious hepatic lesions or dysplastic nodules but imaging is quite limited. The portal and hepatic veins are patent. Pancreas:  No mass, inflammation or ductal dilatation. Spleen: Mild splenomegaly. No focal lesions. Massive perisplenic collateral vessels and a large splenorenal shunt. Adrenals/Urinary Tract: The adrenal glands and kidneys are grossly normal. A small left renal cyst is noted. Stomach/Bowel: Visualized portions within the abdomen are unremarkable. Vascular/Lymphatic: The aorta and branch vessels are patent. The major venous structures are patent. Extensive portal venous collaterals with massive perisplenic collateral vessels an a splenorenal shunt. No obvious enlarged periportal, celiac axis or upper abdominal lymph nodes. Other:  No ascites or abdominal wall hernia. Musculoskeletal: No significant bony findings. IMPRESSION: 1. 3 cm segment 8 liver lesion with early arterial phase enhancement and rapid washout consistent with hepatocellular carcinoma (LR5B). No findings for metastatic adenopathy or portal vein invasion. 2. Advanced cirrhotic changes involving the liver with portal venous hypertension, portal venous collaterals, massive perisplenic collaterals and splenorenal shunt. 3. Mild splenomegaly but no ascites. Electronically Signed   By: Marijo Sanes M.D.   On: 03/22/2016 09:23   US Abdomen Limited Ruq  Result Date: 03/21/2016 CLINICAL DATA:  Hepatic cirrhosis EXAM: US ABDOMEN  LIMITED - RIGHT UPPER QUADRANT COMPARISON:  Aug 02, 2011 FINDINGS: Gallbladder: Surgically absent. Common bile duct: Diameter: 4 mm. No intrahepatic or extrahepatic biliary duct dilatation. Liver: Liver has a coarsened echotexture with overall increased echogenicity consistent with known underlying cirrhosis. The liver contour is nodular, again consistent with cirrhosis. There is a hyperechoic mass arising in the anterior segment of the right lobe measuring 4.0 x 2.8 x 4.0 cm. A much smaller mass is noted near the gallbladder fossa region measuring 0.9 x 0.6 x 0.8 cm. No other focal liver lesions are identified. IMPRESSION: Underlying hepatic cirrhosis. Mass lesions in the liver as noted above, largest measuring 4.0 x 2.8 x 4.0 cm. The possibility of hepatocellular carcinoma must be of concern in this circumstance. Atypical appearance of dysplasia and metastatic disease are differential considerations. Further evaluation is warranted. Advise precontrast and serial post-contrast MR or CT of the liver to further assess in this regard. Gallbladder absent.  No biliary duct dilatation evident. These results will be called to the ordering clinician or representative by the Radiologist Assistant, and communication documented in the PACS or zVision Dashboard. Electronically Signed   By: Lowella Grip III M.D.   On: 03/21/2016 08:35    Labs:  CBC:  Recent Labs  03/18/16 2114 03/19/16 0523 03/21/16 0510 03/22/16 0821  WBC 3.1* 3.0* 3.2* 3.2*  HGB 11.6* 10.6* 9.7* 9.8*  HCT 34.0* 31.3* 27.4* 27.7*  PLT 84* 75* 77* 72*    COAGS:  Recent Labs  03/18/16 2114  INR 1.26  APTT 31    BMP:  Recent Labs  03/19/16 0523 03/21/16 0510 03/22/16 0821 03/23/16 0622  NA 135 132* 133* 135  K 3.7 2.9* 3.5 3.2*  CL 109 109 109 110  CO2 16* 15* 16* 17*  GLUCOSE 190* 131* 134* 118*  BUN 8 8 8 9   CALCIUM 8.7* 8.3* 8.3* 8.4*  CREATININE 0.72 0.69 0.70 0.56  GFRNONAA >60 >60 >60 >60  GFRAA >60 >60 >60  >60    LIVER FUNCTION TESTS:  Recent Labs  03/18/16 2114 03/19/16 0523 03/21/16 0510 03/22/16 0821  BILITOT 1.2 1.1 1.6* 2.1*  AST 60* 53* 50* 44*  ALT 44 40 34 31  ALKPHOS 74 57 44 44  PROT 6.2* 6.1* 5.9* 6.0*  ALBUMIN 3.3* 3.5 3.6 3.3*    TUMOR MARKERS:  Recent Labs  03/21/16 0510 03/22/16 0821 03/23/16 0622  AFPTM  --  415.9* 352.9*  CEA 1.5  --   --     Assessment and Plan:  Beverly Davis is a 76 year old female with early-stage hepatocellular carcinoma, recently diagnosed on MRI during hospitalization for hepatic encephalopathy.  Despite this recent, although first ever, episode of encephalopathy, she is asymptomatic, and has a strong will to live as she has much satisfaction in her own life and participating in the care of her to grandchildren.  I emphasized that early-stage HCC is often best  treated with surgery, and I encouraged her for appointment with our hepatobiliary surgeon Dr. Barry Dienes. After our conversation I have contacted Dr. Barry Dienes, who will kindly review the case.  As an alternative to surgery, I believe she is an excellent candidate for a combined therapy of CT-guided ablation, with pre-ablation embolization. There is good data to show excellent outcomes and overall survival with combined modality therapy, as well as safety and given the 3 cm-4 cm size, I think combined therapy is a good option.  I had a brief discussion with her regarding her episode of hepatic encephalopathy, of which she has recovered. There is a concerning finding on the MRI of a large portosystemic shunt, which I believe has a significant contribution to her mental status changes. I think this is a discussion for future date.  Plan:   1 - Early Stage HCC:   I think she is a very good candidate for a combined modality therapy (CT-guided ablation with pre-ablation arterial embolization), however, Dr. Barry Dienes has kindly agreed to review the case as surgery should be considered as a  first-line therapy with a tumor of this size.  We will wait to schedule her for pre-ablation mesenteric angiogram and tumor embolization until Dr. Barry Dienes has had a chance to review.  2 - contrast-enhanced CT (BRTO protocol): We will move forward with acquiring contrast-enhanced CT in anticipation of any future treatment. CT will be useful for qualifying the mesenteric arterial anatomy for any future treatment. 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   CT can be ordered now.   3 - Hepatic Encephalopathy: She is improving from the standpoint of her hepatic encephalopathy with excellent medical support. 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 I briefly mentioned to her that we can have a further discussion about options for treatment, which would include embolization, as BRTO is indicated to treat this condition. If need be, we can have this discussion in the future. Forthcoming CT study will give [place] better images of the shunt.   Thank you for this interesting consult.  I greatly enjoyed meeting Beverly Davis and look forward to participating in their care.  A copy of this report was sent to the requesting provider on this date.  Electronically Signed: Corrie Mckusick 03/28/2016, 12:30 PM   I spent a total of  60 Minutes   in face to face in clinical consultation, greater than 50% of which was counseling/coordinating care for early-stage [facility], Beverly Davis cirrhosis with portal hypertension and hepatic encephalopathy, possible pre-ablation mesenteric angiogram and tumor embolization, possible CT-guided RFA ablation

## 2016-03-29 ENCOUNTER — Encounter: Payer: Self-pay | Admitting: Interventional Radiology

## 2016-04-03 ENCOUNTER — Ambulatory Visit (INDEPENDENT_AMBULATORY_CARE_PROVIDER_SITE_OTHER): Payer: PPO | Admitting: Internal Medicine

## 2016-04-03 ENCOUNTER — Encounter: Payer: Self-pay | Admitting: Internal Medicine

## 2016-04-03 VITALS — BP 102/60 | HR 70 | Temp 98.0°F | Ht 62.0 in | Wt 140.0 lb

## 2016-04-03 DIAGNOSIS — C220 Liver cell carcinoma: Secondary | ICD-10-CM | POA: Diagnosis not present

## 2016-04-03 DIAGNOSIS — K7290 Hepatic failure, unspecified without coma: Secondary | ICD-10-CM

## 2016-04-03 DIAGNOSIS — E119 Type 2 diabetes mellitus without complications: Secondary | ICD-10-CM

## 2016-04-03 DIAGNOSIS — K7682 Hepatic encephalopathy: Secondary | ICD-10-CM

## 2016-04-03 LAB — CBC WITH DIFFERENTIAL/PLATELET
BASOS PCT: 1 %
Basophils Absolute: 28 cells/uL (ref 0–200)
EOS ABS: 84 {cells}/uL (ref 15–500)
EOS PCT: 3 %
HCT: 33.2 % — ABNORMAL LOW (ref 35.0–45.0)
Hemoglobin: 10.9 g/dL — ABNORMAL LOW (ref 11.7–15.5)
Lymphocytes Relative: 26 %
Lymphs Abs: 728 cells/uL — ABNORMAL LOW (ref 850–3900)
MCH: 34.5 pg — ABNORMAL HIGH (ref 27.0–33.0)
MCHC: 32.8 g/dL (ref 32.0–36.0)
MCV: 105.1 fL — ABNORMAL HIGH (ref 80.0–100.0)
MONOS PCT: 10 %
MPV: 10.6 fL (ref 7.5–12.5)
Monocytes Absolute: 280 cells/uL (ref 200–950)
NEUTROS ABS: 1680 {cells}/uL (ref 1500–7800)
Neutrophils Relative %: 60 %
PLATELETS: 130 10*3/uL — AB (ref 140–400)
RBC: 3.16 MIL/uL — AB (ref 3.80–5.10)
RDW: 14.6 % (ref 11.0–15.0)
WBC: 2.8 10*3/uL — AB (ref 3.8–10.8)

## 2016-04-03 LAB — COMPREHENSIVE METABOLIC PANEL
ALK PHOS: 118 U/L (ref 33–130)
ALT: 68 U/L — AB (ref 6–29)
AST: 86 U/L — AB (ref 10–35)
Albumin: 3.4 g/dL — ABNORMAL LOW (ref 3.6–5.1)
BUN: 9 mg/dL (ref 7–25)
CO2: 25 mmol/L (ref 20–31)
Calcium: 9.6 mg/dL (ref 8.6–10.4)
Chloride: 105 mmol/L (ref 98–110)
Creat: 0.65 mg/dL (ref 0.60–0.93)
GLUCOSE: 141 mg/dL — AB (ref 65–99)
Potassium: 4.1 mmol/L (ref 3.5–5.3)
Sodium: 138 mmol/L (ref 135–146)
TOTAL PROTEIN: 6.5 g/dL (ref 6.1–8.1)
Total Bilirubin: 1.1 mg/dL (ref 0.2–1.2)

## 2016-04-03 LAB — AMMONIA: AMMONIA: 20 umol/L (ref <=47)

## 2016-04-03 MED ORDER — LACTULOSE 10 GM/15ML PO SOLN: 20.0000 g | Freq: Three times a day (TID) | ORAL | 3 refills | Status: DC

## 2016-04-03 NOTE — Progress Notes (Signed)
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  Patient was treated with lactulose in the hospital and discharged home on 30 mg TID.  She is doing much better.  She is scheduled for CT angiogram of the liver for further planning of the ablation.  Discussed with patient that she will need lactulose for the forseeable future.  Will recheck ammonia level today and CBC and CMET.  Blood sugars discussed today and they will work on diet and exercise as metformin was stopped due to rash and also secondary to CTA upcoming.  She will check sugars very closely and keep log.  We can consider GLP1 if needed.  She is doing well with her medications which were reviewed today.  She also understands her disease state.  She will follow-up with IR and general surgery further about ablation for the hepatocellular carcinoma.   Over 40 minutes of exam, counseling, chart review, and complex, high/moderate level critical decision making was performed this visit.   HPI 76 y.o.female presents for follow up from the hospital. Admit date to the hospital was 03/18/16, patient was discharged from the hospital on 03/23/16 and our office contacted the office the day after discharge to set up a follow up appointment, patient was admitted for: hepatic encephalopathy.  She was found to have a mass on her liver which was likely [place].  She was set up with interventional radiology.  She is currently getting set up with Dr. Barry Dienes for a possible ablation of the liver tumor.  She reports that she is done with the levaquin and has also has cut back on the mucinex.  She is taking the lactulose regularly.  Her husband reports that she has not been taking it on a schedule.  She is having some mild higher blood sugars.  She is generally running in the high 190s to low 200s.  She reports that she is starting to come back down a little bit.  She notes  that she has been walking around the block now.  She reports that this has been limited due to the weather.    Images while in the hospital: [place]  Result Date: 03/29/2016 Please refer to "Notes" to see consult details.   Past Medical History:  Diagnosis Date  . Breast cancer ([place]) 05/2006   Right breast cancer  . Cirrhosis ([place])   . Diabetes mellitus without complication ([place])   . Fibroids   . Hypertension   . Osteopenia   . Thrombocytopenia ([place])   . Thyroid disease      Allergies  Allergen Reactions  . Metformin And Related Other (See Comments)    TAKING MEDICATION CURRENTLY  . Welchol [Colesevelam Hcl] Other (See Comments)      Current Outpatient Prescriptions on File Prior to Visit  Medication Sig Dispense Refill  . alendronate (FOSAMAX) 70 MG tablet Take 1 tablet (70 mg total) by mouth once a week. (Patient taking differently: Take 70 mg by mouth every Saturday. ) 4 tablet 3  . Cholecalciferol (VITAMIN D) 2000 UNITS tablet Take 4,000 Units by mouth 2 (two) times daily.    . fluticasone (FLONASE) 50 MCG/ACT nasal spray Place 2 sprays into both nostrils daily. 16 g 0  . glipiZIDE (GLUCOTROL) 5 MG tablet TAKE ONE TABLET BY MOUTH THREE TIMES DAILY BEFORE MEAL(S). (Patient taking differently: Take 5 mg by mouth 2 (two) times  daily before a meal. ) 270 tablet 0  . guaiFENesin (MUCINEX) 600 MG 12 hr tablet Take 1 tablet (600 mg total) by mouth 2 (two) times daily. 30 tablet 0  . lactulose (CHRONULAC) 10 GM/15ML solution Take 30 mLs (20 g total) by mouth 3 (three) times daily. 240 mL 0  . levothyroxine (SYNTHROID, LEVOTHROID) 150 MCG tablet TAKE ONE TO ONE & ONE-HALF TABLETS BY MOUTH ONCE DAILY AS DIRECTED 135 tablet 0  . linagliptin (TRADJENTA) 5 MG TABS tablet Take 1 tablet (5 mg total) by mouth daily. 30 tablet 3  . sodium bicarbonate 650 MG tablet Take 2 tablets (1,300 mg total) by mouth daily. 30 tablet 0  . triamcinolone cream (KENALOG) 0.5 % Apply 1  application topically 2 (two) times daily. (Patient taking differently: Apply 1 application topically 2 (two) times daily as needed (IRRITATION). ) 80 g 2   No current facility-administered medications on file prior to visit.     ROS: all negative except above.   Physical Exam: Filed Weights   04/03/16 1400  Weight: 140 lb (63.5 kg)   BP 102/60   Pulse 70   Temp 98 F (36.7 C) (Temporal)   Ht 5' 2"  (1.575 m)   Wt 140 lb (63.5 kg)   BMI 25.61 kg/m  General Appearance: Well nourished, in no apparent distress. Eyes: PERRLA, EOMs, conjunctiva no swelling or erythema Sinuses: No Frontal/maxillary tenderness ENT/Mouth: Ext aud canals clear, TMs without erythema, bulging. No erythema, swelling, or exudate on post pharynx.  Tonsils not swollen or erythematous. Hearing normal.  Neck: Supple, thyroid normal.  Respiratory: Respiratory effort normal, BS equal bilaterally without rales, rhonchi, wheezing or stridor.  Cardio: RRR with no MRGs. Brisk peripheral pulses without edema.  Abdomen: Soft, + BS.  Non tender, no guarding, rebound, hernias, masses. Lymphatics: Non tender without lymphadenopathy.  Musculoskeletal: Full ROM, 5/5 strength, normal gait.  Skin: Warm, dry without rashes, lesions, ecchymosis.  Neuro: Cranial nerves intact. Normal muscle tone, no cerebellar symptoms. Sensation intact. Negative for asterexis, no tremors noted.   Psych: Awake and oriented X 3, normal affect, Insight and Judgment appropriate.     Starlyn Skeans, PA-C 2:18 PM [facility]

## 2016-04-03 NOTE — Patient Instructions (Signed)
Please stop the mucinex and flonase.  Please work on Beverly Davis.  Now that you are no longer on your metformin you will need to make sure you are better with your diet.  Avoid sweets and starches (ie bread, rice, toast, potatoes).

## 2016-04-04 ENCOUNTER — Telehealth: Payer: Self-pay | Admitting: *Deleted

## 2016-04-04 LAB — AMMONIA

## 2016-04-04 NOTE — Telephone Encounter (Signed)
Patient called asking if she is cleared to drive now that her ammonia level is in the "normal range."  Per Starlyn Skeans, PA-C's orders, patient was called and advised she is not cleared to drive yet.  We need to make sure her ammonia level stays down in a normal range and she needs to have her f/u with general surgery and CT and ablation then we can discuss when she will be cleared to drive.  Patient expressed understanding.

## 2016-04-08 ENCOUNTER — Ambulatory Visit (HOSPITAL_COMMUNITY)
Admission: RE | Admit: 2016-04-08 | Discharge: 2016-04-08 | Disposition: A | Discharge status: Home / Self Care | Payer: PPO | Source: Ambulatory Visit | Attending: Interventional Radiology | Admitting: Interventional Radiology

## 2016-04-08 ENCOUNTER — Other Ambulatory Visit (HOSPITAL_COMMUNITY): Payer: Self-pay | Admitting: Radiology

## 2016-04-08 ENCOUNTER — Encounter (HOSPITAL_COMMUNITY): Payer: Self-pay

## 2016-04-08 DIAGNOSIS — K7460 Unspecified cirrhosis of liver: Secondary | ICD-10-CM | POA: Insufficient documentation

## 2016-04-08 DIAGNOSIS — C220 Liver cell carcinoma: Secondary | ICD-10-CM

## 2016-04-08 DIAGNOSIS — K766 Portal hypertension: Secondary | ICD-10-CM | POA: Insufficient documentation

## 2016-04-08 DIAGNOSIS — Z9049 Acquired absence of other specified parts of digestive tract: Secondary | ICD-10-CM | POA: Insufficient documentation

## 2016-04-08 DIAGNOSIS — K5730 Diverticulosis of large intestine without perforation or abscess without bleeding: Secondary | ICD-10-CM | POA: Insufficient documentation

## 2016-04-08 DIAGNOSIS — Z90710 Acquired absence of both cervix and uterus: Secondary | ICD-10-CM | POA: Insufficient documentation

## 2016-04-08 MED ORDER — IOPAMIDOL (ISOVUE-300) INJECTION 61%
INTRAVENOUS | Status: AC
  Administered 2016-04-08: 100 mL
  Filled 2016-04-08: qty 100

## 2016-04-10 ENCOUNTER — Ambulatory Visit: Payer: Self-pay | Admitting: Physician Assistant

## 2016-04-11 ENCOUNTER — Other Ambulatory Visit (HOSPITAL_COMMUNITY): Payer: Self-pay | Admitting: Radiology

## 2016-04-11 ENCOUNTER — Other Ambulatory Visit: Payer: Self-pay | Admitting: Interventional Radiology

## 2016-04-11 DIAGNOSIS — C229 Malignant neoplasm of liver, not specified as primary or secondary: Secondary | ICD-10-CM | POA: Diagnosis not present

## 2016-04-11 DIAGNOSIS — C220 Liver cell carcinoma: Secondary | ICD-10-CM

## 2016-04-12 ENCOUNTER — Ambulatory Visit (HOSPITAL_COMMUNITY)
Admission: RE | Admit: 2016-04-12 | Discharge: 2016-04-12 | Disposition: A | Discharge status: Home / Self Care | Payer: PPO | Source: Ambulatory Visit | Attending: Interventional Radiology | Admitting: Interventional Radiology

## 2016-04-12 DIAGNOSIS — K5730 Diverticulosis of large intestine without perforation or abscess without bleeding: Secondary | ICD-10-CM | POA: Diagnosis not present

## 2016-04-12 DIAGNOSIS — Z90710 Acquired absence of both cervix and uterus: Secondary | ICD-10-CM | POA: Insufficient documentation

## 2016-04-12 DIAGNOSIS — K766 Portal hypertension: Secondary | ICD-10-CM | POA: Diagnosis not present

## 2016-04-12 DIAGNOSIS — C220 Liver cell carcinoma: Secondary | ICD-10-CM | POA: Diagnosis not present

## 2016-04-12 DIAGNOSIS — Z9049 Acquired absence of other specified parts of digestive tract: Secondary | ICD-10-CM | POA: Insufficient documentation

## 2016-04-12 DIAGNOSIS — I700 Atherosclerosis of aorta: Secondary | ICD-10-CM | POA: Diagnosis not present

## 2016-04-12 DIAGNOSIS — K7460 Unspecified cirrhosis of liver: Secondary | ICD-10-CM | POA: Insufficient documentation

## 2016-04-17 ENCOUNTER — Other Ambulatory Visit: Payer: Self-pay | Admitting: Interventional Radiology

## 2016-04-17 DIAGNOSIS — C220 Liver cell carcinoma: Secondary | ICD-10-CM

## 2016-04-17 DIAGNOSIS — K7460 Unspecified cirrhosis of liver: Secondary | ICD-10-CM | POA: Diagnosis not present

## 2016-04-17 DIAGNOSIS — C229 Malignant neoplasm of liver, not specified as primary or secondary: Secondary | ICD-10-CM | POA: Diagnosis not present

## 2016-04-24 ENCOUNTER — Ambulatory Visit (INDEPENDENT_AMBULATORY_CARE_PROVIDER_SITE_OTHER): Payer: PPO | Admitting: Internal Medicine

## 2016-04-24 ENCOUNTER — Encounter: Payer: Self-pay | Admitting: Internal Medicine

## 2016-04-24 VITALS — BP 118/60 | HR 60 | Temp 97.3°F | Resp 14 | Ht 62.0 in | Wt 144.0 lb

## 2016-04-24 DIAGNOSIS — D696 Thrombocytopenia, unspecified: Secondary | ICD-10-CM

## 2016-04-24 DIAGNOSIS — E559 Vitamin D deficiency, unspecified: Secondary | ICD-10-CM

## 2016-04-24 DIAGNOSIS — R296 Repeated falls: Secondary | ICD-10-CM

## 2016-04-24 DIAGNOSIS — D61818 Other pancytopenia: Secondary | ICD-10-CM | POA: Diagnosis not present

## 2016-04-24 DIAGNOSIS — E1122 Type 2 diabetes mellitus with diabetic chronic kidney disease: Secondary | ICD-10-CM | POA: Diagnosis not present

## 2016-04-24 DIAGNOSIS — E785 Hyperlipidemia, unspecified: Secondary | ICD-10-CM | POA: Diagnosis not present

## 2016-04-24 DIAGNOSIS — Z0000 Encounter for general adult medical examination without abnormal findings: Secondary | ICD-10-CM

## 2016-04-24 DIAGNOSIS — R6889 Other general symptoms and signs: Secondary | ICD-10-CM

## 2016-04-24 DIAGNOSIS — I10 Essential (primary) hypertension: Secondary | ICD-10-CM

## 2016-04-24 DIAGNOSIS — Z0001 Encounter for general adult medical examination with abnormal findings: Secondary | ICD-10-CM | POA: Diagnosis not present

## 2016-04-24 DIAGNOSIS — Z79899 Other long term (current) drug therapy: Secondary | ICD-10-CM

## 2016-04-24 DIAGNOSIS — E039 Hypothyroidism, unspecified: Secondary | ICD-10-CM | POA: Diagnosis not present

## 2016-04-24 DIAGNOSIS — M810 Age-related osteoporosis without current pathological fracture: Secondary | ICD-10-CM

## 2016-04-24 DIAGNOSIS — K7290 Hepatic failure, unspecified without coma: Secondary | ICD-10-CM

## 2016-04-24 DIAGNOSIS — R232 Flushing: Secondary | ICD-10-CM | POA: Diagnosis not present

## 2016-04-24 DIAGNOSIS — C50911 Malignant neoplasm of unspecified site of right female breast: Secondary | ICD-10-CM

## 2016-04-24 DIAGNOSIS — N182 Chronic kidney disease, stage 2 (mild): Secondary | ICD-10-CM

## 2016-04-24 DIAGNOSIS — K7682 Hepatic encephalopathy: Secondary | ICD-10-CM

## 2016-04-24 MED ORDER — EXENATIDE ER 2 MG ~~LOC~~ PEN: 1.0000 "pen " | PEN_INJECTOR | SUBCUTANEOUS | 3 refills | Status: DC

## 2016-04-24 NOTE — Progress Notes (Signed)
MEDICARE ANNUAL WELLNESS VISIT AND FOLLOW UP Assessment:    1. Type 2 diabetes mellitus with stage 2 chronic kidney disease, without long-term current use of insulin ([place]) -trained on Bydureon BCise pen and patient will start once weekly after her next procedure.  Recommended starting on Sundays. -cont daily tradjenta -use glipizide 1/2 tablet with lunch and dinner only if blood sugars are above 150 -keep CBG log daily -avoid starches -discussed pitfall foods to avoid -patient will call if difficulty with pen use  2. Encounter for Medicare annual wellness exam -due next year  3. Essential hypertension -cont meds -dash diet -exercise as tolerated -monitor at home  4. Hot flashes -not estrogen candidate  5. Hypothyroidism, unspecified type -cont levothyroxine -TSH at least twice yearly  6. 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 Osteoporosis, unspecified osteoporosis type, unspecified pathological fracture presence -cont fosamax -needs repeat DEXA this year or next  8. Pancytopenia ([place]) -monitor CBC -may be related to recent SEPSIS  9. Malignant neoplasm of right female breast, unspecified estrogen receptor status, unspecified site of breast ([place]) -follows with oncology  10. Hyperlipidemia, unspecified hyperlipidemia type -cont diet and exercise -given liver disease not a candidate for Statins  11. Medication management -done at least quarterly  12. Thrombocytopenia ([facility]) -monitor CBC  13. Vitamin D deficiency -cont Vit D  14. Frequent falls -recommended use of cane -likely related to hepatic encephalopathy spells      Over 30 minutes of exam, counseling, chart review, and critical decision making was performed  Future Appointments Date Time Provider [place]  05/01/2016 7:30 AM [facility] [place] ROOM [facility] None  05/01/2016 9:30 AM [facility] 1 [facility] [place]  05/08/2016 1:30 PM [facility] FINANCIAL COUNSELOR [facility] None   05/08/2016 1:45 PM [facility] LAB [facility] None  05/08/2016 2:00 PM [name], MD [facility] None  05/22/2016 3:30 PM Starlyn Skeans, PA-C [facility] None  01/21/2017 2:00 PM Vicie Mutters, PA-C [facility] None     Plan:   During the course of the visit the patient was educated and counseled about appropriate screening and preventive services including:    Pneumococcal vaccine   Influenza vaccine  Prevnar 13  Td vaccine  Screening electrocardiogram  Colorectal cancer screening  Diabetes screening  Glaucoma screening  Nutrition counseling    Subjective:  Beverly Davis is a 76 y.o. female who presents for Medicare Annual Wellness Visit [place] tutorial.  She reports that she has been checking her blood sugars at home and they are running around 200-250.  This is fasting blood sugars.  She is taking her oral medications as prescribed.      Medication Review: Current Outpatient Prescriptions on File Prior to Visit  Medication Sig Dispense Refill  . alendronate (FOSAMAX) 70 MG tablet Take 1 tablet (70 mg total) by mouth once a week. (Patient taking differently: Take 70 mg by mouth every Saturday. ) 4 tablet 3  . Cholecalciferol (VITAMIN D) 2000 UNITS tablet Take 4,000 Units by mouth 2 (two) times daily.    Marland Kitchen glipiZIDE (GLUCOTROL) 5 MG tablet TAKE ONE TABLET BY MOUTH THREE TIMES DAILY BEFORE MEAL(S). (Patient taking differently: Take 5 mg by mouth 2 (two) times daily before a meal. ) 270 tablet 0  . lactulose (CHRONULAC) 10 GM/15ML solution Take 30 mLs (20 g total) by mouth 3 (three) times daily. 240 mL 3  . levothyroxine (SYNTHROID, LEVOTHROID) 150 MCG tablet TAKE ONE TO ONE & ONE-HALF TABLETS BY MOUTH ONCE DAILY AS DIRECTED 135 tablet  0  . linagliptin (TRADJENTA) 5 MG TABS tablet Take 1 tablet (5 mg total) by mouth daily. 30 tablet 3  . sodium bicarbonate 650 MG tablet Take 2 tablets (1,300 mg total) by mouth daily. 30 tablet 0  . triamcinolone cream (KENALOG) 0.5 % Apply 1  application topically 2 (two) times daily. (Patient taking differently: Apply 1 application topically 2 (two) times daily as needed (IRRITATION). ) 80 g 2   No current facility-administered medications on file prior to visit.     Allergies: Allergies  Allergen Reactions  . Metformin And Related Other (See Comments)    TAKING MEDICATION CURRENTLY  . Welchol [Colesevelam Hcl] Other (See Comments)    Current Problems (verified) has Hot flashes; Breast cancer ([place]); Osteoporosis; T2_NIDDM w/CKD2; Hypothyroidism; Essential hypertension; Vitamin D deficiency; Medication management; Hyperlipemia; Diabetes mellitus without complication ([place]); Hepatic encephalopathy ([place]); Fall; Thrombocytopenia ([place]); Sepsis ([place]); Elevated lactic acid level; and Pancytopenia ([place]) on her problem list.  Screening Tests Immunization History  Administered Date(s) Administered  . Influenza Split 03/06/2011, 01/06/2013  . Influenza, High Dose Seasonal PF 01/12/2014, 12/14/2014, 12/27/2015  . Pneumococcal Conjugate-13 01/12/2014  . Pneumococcal Polysaccharide-23 08/30/2014  . Tdap 06/10/2012    Preventative care: Last colonoscopy: 2006, 2016 [facility] [place]) Mammogram:  2016 [id number]  Names of Other Physician/Practitioners you currently use: 1. [facility] here for primary care 2. Dr.  Sabra Heck, eye doctor, last visit 2017 3. Does not currently see dentist, last visit  Patient Care Team: Unk Pinto, MD as PCP - General (Internal Medicine) Carol Ada, MD as Consulting Physician (Gastroenterology) Inda Castle, MD as Consulting Physician (Gastroenterology)  Surgical: She  has a past surgical history that includes Breast lumpectomy with axillary lymph node biopsy (Right, 06/25/2006); Cholecystectomy; Abdominal hysterectomy; Tubal ligation (1978); and ir generic historical (03/28/2016). Family Her family history includes Cancer in her father, mother, and paternal  grandfather; Diabetes in her maternal aunt, sister, and sister; Heart Problems in her maternal grandfather; Hypertension in her sister; Liver disease in her brother. Social history  She reports that she has never smoked. She has never used smokeless tobacco. She reports that she does not drink alcohol or use drugs.  MEDICARE WELLNESS OBJECTIVES: Physical activity: Current Exercise Habits: The patient does not participate in regular exercise at present Cardiac risk factors: Cardiac Risk Factors include: advanced age (>15mn, >>65women);diabetes mellitus;hypertension;sedentary lifestyle Depression/mood screen:   [facility]  Decreased Interest 0  Down, Depressed, Hopeless 0  PHQ - 2 Score 0    ADLs:  In your present state of health, do you have any difficulty performing the following activities: 04/24/2016 03/19/2016  Hearing? N Y  Vision? N Y  Difficulty concentrating or making decisions? N Y  Walking or climbing stairs? N N  Dressing or bathing? N N  Doing errands, shopping? N N  Preparing Food and eating ? N -  Using the Toilet? N -  In the past six months, have you accidently leaked urine? N -  Do you have problems with loss of bowel control? N -  Managing your Medications? N -  Managing your Finances? N -  Housekeeping or managing your Housekeeping? N -  Some recent data might be hidden     Cognitive Testing  Alert? Yes  Normal Appearance?Yes  Oriented to person? Yes  Place? Yes   Time? Yes  Recall of three objects?  Yes  Can perform simple calculations? Yes  Displays appropriate judgment?Yes  Can read  the correct time from a watch face?Yes  EOL planning: Does Patient Have a Medical Advance Directive?: No Would patient like information on creating a medical advance directive?: Yes (MAU/Ambulatory/Procedural Areas - Information given)   Objective:   Today's Vitals   04/24/16 1003  BP: 118/60  Pulse: 60  Resp: 14  Temp: 97.3 F (36.3 C)   SpO2: 99%  Weight: 144 lb (65.3 kg)  Height: 5' 2"  (1.575 m)   Body mass index is 26.34 kg/m.  General appearance: alert, no distress, WD/WN, female HEENT: normocephalic, sclerae anicteric, TMs pearly, nares patent, no discharge or erythema, pharynx normal Oral cavity: MMM, no lesions Neck: supple, no lymphadenopathy, no thyromegaly, no masses Heart: RRR, normal S1, S2, no murmurs Lungs: CTA bilaterally, no wheezes, rhonchi, or rales Abdomen: +bs, soft, non tender, non distended, no masses, no hepatomegaly, no splenomegaly Musculoskeletal: nontender, no swelling, no obvious deformity Extremities: no edema, no cyanosis, no clubbing Pulses: 2+ symmetric, upper and lower extremities, normal cap refill Neurological: alert, oriented x 3, CN2-12 intact, strength normal upper extremities and lower extremities, sensation normal throughout, DTRs 2+ throughout, no cerebellar signs, gait normal Psychiatric: normal affect, behavior normal, pleasant   Medicare Attestation I have personally reviewed: The patient's medical and social history Their use of alcohol, tobacco or illicit drugs Their current medications and supplements The patient's functional ability including ADLs,fall risks, home safety risks, cognitive, and hearing and visual impairment Diet and physical activities Evidence for depression or mood disorders  The patient's weight, height, BMI, and visual acuity have been recorded in the chart.  I have made referrals, counseling, and provided education to the patient based on review of the above and I have provided the patient with a written personalized care plan for preventive services.     Starlyn Skeans, PA-C   04/27/2016

## 2016-04-24 NOTE — Patient Instructions (Signed)
Please start getting exercise for at least 30 minutes per day.  You can do exercise DVDs or you can go somewhere and walk.  You can even do laps around your house inside.    Please continue to take your tradjenta which is a once daily medication for your blood sugars.    Please check your blood sugars before meals.  If your blood sugar is greater than 150 please take the glipizide tablet.    On Sundays please wake up and put your bydureon pen out on the counter.  Please let this sit for 30 minutes.  When you are ready to use this please take the pen and shake it until it is really well mixed.  Once you see that the liquid is totally mixed and a milky color please turn the knob on the bottom from lock to unlock with the orange cap pointed toward the ceiling.  Then you are going to put the plunger flat against your stomach and push hard until you see the medicine disappearing and the orange coming down where the medication is.  You will need to push hard and hold it there for 15 full seconds.  Then you can put the cap back on and throw it in the garbage.  You are only going to do this once per week on Sundays.  Do not start this medication until you have your procedure next week.  Remember to store this medication in the fridge.     Your goal blood sugars first thing in the morning is 100-125.  2 hours after a meal we are looking for your blood sugars to be less than 220.

## 2016-04-29 ENCOUNTER — Telehealth: Payer: Self-pay | Admitting: *Deleted

## 2016-04-29 NOTE — Telephone Encounter (Signed)
Patient called and states she is upset and concerned about starting the Exenatide ER 2 mg injection once weekly. She is concerned about the cost of $75 co-pay.  Per Dr Melford Aase, the patient needs to start the injections due to the oral medications can cause liver damage.  The patient is to start the injections on 05/05/2016. The patient said she can ask her daughter in law to help her with the injections and states she will try the medication.

## 2016-04-30 ENCOUNTER — Other Ambulatory Visit: Payer: Self-pay | Admitting: Radiology

## 2016-05-01 ENCOUNTER — Other Ambulatory Visit: Payer: Self-pay | Admitting: Interventional Radiology

## 2016-05-01 ENCOUNTER — Ambulatory Visit (HOSPITAL_COMMUNITY)
Admission: RE | Admit: 2016-05-01 | Discharge: 2016-05-01 | Disposition: A | Discharge status: Home / Self Care | Payer: PPO | Source: Ambulatory Visit | Attending: Interventional Radiology | Admitting: Interventional Radiology

## 2016-05-01 ENCOUNTER — Encounter (HOSPITAL_COMMUNITY): Payer: Self-pay

## 2016-05-01 DIAGNOSIS — I10 Essential (primary) hypertension: Secondary | ICD-10-CM | POA: Insufficient documentation

## 2016-05-01 DIAGNOSIS — C220 Liver cell carcinoma: Secondary | ICD-10-CM

## 2016-05-01 DIAGNOSIS — K7460 Unspecified cirrhosis of liver: Secondary | ICD-10-CM | POA: Insufficient documentation

## 2016-05-01 DIAGNOSIS — Z7984 Long term (current) use of oral hypoglycemic drugs: Secondary | ICD-10-CM | POA: Diagnosis not present

## 2016-05-01 DIAGNOSIS — Z853 Personal history of malignant neoplasm of breast: Secondary | ICD-10-CM | POA: Diagnosis not present

## 2016-05-01 DIAGNOSIS — M8580 Other specified disorders of bone density and structure, unspecified site: Secondary | ICD-10-CM | POA: Diagnosis not present

## 2016-05-01 DIAGNOSIS — D696 Thrombocytopenia, unspecified: Secondary | ICD-10-CM | POA: Diagnosis not present

## 2016-05-01 DIAGNOSIS — K7581 Nonalcoholic steatohepatitis (NASH): Secondary | ICD-10-CM | POA: Insufficient documentation

## 2016-05-01 DIAGNOSIS — E079 Disorder of thyroid, unspecified: Secondary | ICD-10-CM | POA: Diagnosis not present

## 2016-05-01 DIAGNOSIS — C228 Malignant neoplasm of liver, primary, unspecified as to type: Secondary | ICD-10-CM | POA: Diagnosis not present

## 2016-05-01 DIAGNOSIS — E119 Type 2 diabetes mellitus without complications: Secondary | ICD-10-CM | POA: Diagnosis not present

## 2016-05-01 DIAGNOSIS — I774 Celiac artery compression syndrome: Secondary | ICD-10-CM | POA: Insufficient documentation

## 2016-05-01 HISTORY — PX: IR GENERIC HISTORICAL: IMG1180011

## 2016-05-01 LAB — CBC WITH DIFFERENTIAL/PLATELET
Basophils Absolute: 0 10*3/uL (ref 0.0–0.1)
Basophils Relative: 1 %
EOS ABS: 0.1 10*3/uL (ref 0.0–0.7)
EOS PCT: 3 %
HCT: 34 % — ABNORMAL LOW (ref 36.0–46.0)
Hemoglobin: 11.9 g/dL — ABNORMAL LOW (ref 12.0–15.0)
LYMPHS ABS: 0.6 10*3/uL — AB (ref 0.7–4.0)
Lymphocytes Relative: 21 %
MCH: 35.4 pg — AB (ref 26.0–34.0)
MCHC: 35 g/dL (ref 30.0–36.0)
MCV: 101.2 fL — ABNORMAL HIGH (ref 78.0–100.0)
MONOS PCT: 14 %
Monocytes Absolute: 0.4 10*3/uL (ref 0.1–1.0)
Neutro Abs: 1.8 10*3/uL (ref 1.7–7.7)
Neutrophils Relative %: 61 %
PLATELETS: 95 10*3/uL — AB (ref 150–400)
RBC: 3.36 MIL/uL — ABNORMAL LOW (ref 3.87–5.11)
RDW: 14 % (ref 11.5–15.5)
WBC: 2.9 10*3/uL — AB (ref 4.0–10.5)

## 2016-05-01 LAB — COMPREHENSIVE METABOLIC PANEL
ALT: 48 U/L (ref 14–54)
ANION GAP: 8 (ref 5–15)
AST: 59 U/L — ABNORMAL HIGH (ref 15–41)
Albumin: 3.4 g/dL — ABNORMAL LOW (ref 3.5–5.0)
Alkaline Phosphatase: 96 U/L (ref 38–126)
BUN: 9 mg/dL (ref 6–20)
CHLORIDE: 108 mmol/L (ref 101–111)
CO2: 21 mmol/L — AB (ref 22–32)
Calcium: 9 mg/dL (ref 8.9–10.3)
Creatinine, Ser: 0.63 mg/dL (ref 0.44–1.00)
GFR calc non Af Amer: >60 mL/min (ref >60)
Glucose, Bld: 222 mg/dL — ABNORMAL HIGH (ref 65–99)
Potassium: 4.1 mmol/L (ref 3.5–5.1)
SODIUM: 137 mmol/L (ref 135–145)
Total Bilirubin: 1.4 mg/dL — ABNORMAL HIGH (ref 0.3–1.2)
Total Protein: 6.6 g/dL (ref 6.5–8.1)

## 2016-05-01 LAB — APTT: aPTT: 32 seconds (ref 24–36)

## 2016-05-01 LAB — PROTIME-INR
INR: 1.22
PROTHROMBIN TIME: 15.4 s — AB (ref 11.4–15.2)

## 2016-05-01 LAB — GLUCOSE, CAPILLARY: Glucose-Capillary: 238 mg/dL — ABNORMAL HIGH (ref 65–99)

## 2016-05-01 MED ORDER — IOPAMIDOL (ISOVUE-300) INJECTION 61%
50.0000 mL | Freq: Once | INTRAVENOUS | Status: AC | PRN
Start: 2016-05-01 — End: 2016-05-01
  Administered 2016-05-01: 50 mL via INTRA_ARTERIAL

## 2016-05-01 MED ORDER — FENTANYL CITRATE (PF) 100 MCG/2ML IJ SOLN
INTRAMUSCULAR | Status: AC | PRN
  Administered 2016-05-01 (×2): 50 ug via INTRAVENOUS

## 2016-05-01 MED ORDER — METRONIDAZOLE IN NACL 5-0.79 MG/ML-% IV SOLN
500.0000 mg | Freq: Three times a day (TID) | INTRAVENOUS | Status: DC
  Administered 2016-05-01: 500 mg via INTRAVENOUS
  Filled 2016-05-01 (×6): qty 100

## 2016-05-01 MED ORDER — GELATIN ABSORBABLE 12-7 MM EX MISC
CUTANEOUS | Status: AC | PRN
  Administered 2016-05-01: 1

## 2016-05-01 MED ORDER — LIDOCAINE HCL 1 % IJ SOLN
INTRAMUSCULAR | Status: AC | PRN
  Administered 2016-05-01: 5 mL

## 2016-05-01 MED ORDER — IOPAMIDOL (ISOVUE-300) INJECTION 61%
INTRAVENOUS | Status: AC
  Administered 2016-05-01: 50 mL via INTRA_ARTERIAL
  Filled 2016-05-01: qty 300

## 2016-05-01 MED ORDER — IOPAMIDOL (ISOVUE-300) INJECTION 61%
100.0000 mL | Freq: Once | INTRAVENOUS | Status: AC | PRN
  Administered 2016-05-01: 50 mL via INTRAVENOUS

## 2016-05-01 MED ORDER — IOPAMIDOL (ISOVUE-300) INJECTION 61%
75.0000 mL | Freq: Once | INTRAVENOUS | Status: AC | PRN
  Administered 2016-05-01: 75 mL via INTRA_ARTERIAL

## 2016-05-01 MED ORDER — ONDANSETRON HCL 4 MG/2ML IJ SOLN
4.0000 mg | Freq: Once | INTRAMUSCULAR | Status: AC
  Administered 2016-05-01: 4 mg via INTRAVENOUS
  Filled 2016-05-01: qty 2

## 2016-05-01 MED ORDER — MIDAZOLAM HCL 2 MG/2ML IJ SOLN
INTRAMUSCULAR | Status: AC
  Filled 2016-05-01: qty 6

## 2016-05-01 MED ORDER — FENTANYL CITRATE (PF) 100 MCG/2ML IJ SOLN
INTRAMUSCULAR | Status: AC
  Filled 2016-05-01: qty 6

## 2016-05-01 MED ORDER — IOPAMIDOL (ISOVUE-300) INJECTION 61%
100.0000 mL | Freq: Once | INTRAVENOUS | Status: AC | PRN
  Administered 2016-05-01: 20 mL via INTRAVENOUS

## 2016-05-01 MED ORDER — GELATIN ABSORBABLE 12-7 MM EX MISC
CUTANEOUS | Status: AC
  Filled 2016-05-01: qty 1

## 2016-05-01 MED ORDER — ONDANSETRON HCL 4 MG/2ML IJ SOLN: 4.0000 mg | Freq: Once | INTRAMUSCULAR | Status: DC

## 2016-05-01 MED ORDER — LEVOFLOXACIN IN D5W 500 MG/100ML IV SOLN: 500.0000 mg | Freq: Once | INTRAVENOUS | Status: DC

## 2016-05-01 MED ORDER — SODIUM CHLORIDE 0.9 % IV SOLN
INTRAVENOUS | Status: DC
  Administered 2016-05-01 (×2): via INTRAVENOUS

## 2016-05-01 MED ORDER — LIDOCAINE HCL 1 % IJ SOLN
INTRAMUSCULAR | Status: AC
  Filled 2016-05-01: qty 20

## 2016-05-01 MED ORDER — ONDANSETRON HCL 4 MG/2ML IJ SOLN: 4.0000 mg | Freq: Four times a day (QID) | INTRAMUSCULAR | Status: DC | PRN

## 2016-05-01 MED ORDER — IOPAMIDOL (ISOVUE-300) INJECTION 61%
INTRAVENOUS | Status: AC
Start: 2016-05-01 — End: 2016-05-01
  Administered 2016-05-01: 75 mL via INTRA_ARTERIAL
  Filled 2016-05-01: qty 200

## 2016-05-01 MED ORDER — MIDAZOLAM HCL 2 MG/2ML IJ SOLN
INTRAMUSCULAR | Status: AC | PRN
  Administered 2016-05-01 (×3): 1 mg via INTRAVENOUS

## 2016-05-01 MED ORDER — KETOROLAC TROMETHAMINE 30 MG/ML IJ SOLN
30.0000 mg | Freq: Once | INTRAMUSCULAR | Status: AC
  Administered 2016-05-01: 30 mg via INTRAVENOUS
  Filled 2016-05-01: qty 1

## 2016-05-01 MED ORDER — PIPERACILLIN-TAZOBACTAM 3.375 G IVPB
3.3750 g | Freq: Three times a day (TID) | INTRAVENOUS | Status: DC
  Administered 2016-05-01: 3.375 g via INTRAVENOUS
  Filled 2016-05-01 (×4): qty 50

## 2016-05-01 NOTE — H&P (Signed)
Referring Physician(s): Ennever,P  Supervising Physician: Corrie Mckusick  Patient Status: WL OP  Chief Complaint:  Hepatocellular carcinoma  Subjective: Pt familiar to IR service from recent consultation with Dr. Earleen Newport on 03/28/16 to discuss treatment options for a segment 8 hepatocellular carcinoma. See full consult note for additional details. She has a history of NASH cirrhosis and remote history of right breast cancer 2008. Recent imaging has revealed approximately 4 cm segment 8 hepatocellular carcinoma. Following evaluation by Dr. Earleen Newport she was deemed an appropriate candidate for bland hepatic tumor embolization followed by radiofrequency ablation at later date. She presents today for hepatic arteriography with possible tumor embolization. She currently denies fever, headache, chest pain, dyspnea, cough, abdominal/back pain, nausea, vomiting or abnormal bleeding. Past Medical History:  Diagnosis Date  . Breast cancer ([place]) 05/2006   Right breast cancer  . Cirrhosis ([place])   . Diabetes mellitus without complication ([place])   . Fibroids   . Hypertension   . Osteopenia   . Thrombocytopenia ([place])   . Thyroid disease    Past Surgical History:  Procedure Laterality Date  . ABDOMINAL HYSTERECTOMY     TAH BSO  . BREAST LUMPECTOMY WITH AXILLARY LYMPH NODE BIOPSY Right 06/25/2006  . CHOLECYSTECTOMY    . IR GENERIC HISTORICAL  03/28/2016   IR RADIOLOGIST EVAL & MGMT 03/28/2016 Corrie Mckusick, DO [facility] INTERV RAD  . TUBAL LIGATION  1978      Allergies: Metformin and related and Welchol [colesevelam hcl]  Medications: Prior to Admission medications   Medication Sig Start Date End Date Taking? Authorizing Provider  glipiZIDE (GLUCOTROL) 5 MG tablet TAKE ONE TABLET BY MOUTH THREE TIMES DAILY BEFORE MEAL(S). Patient taking differently: Take 5 mg by mouth 2 (two) times daily before a meal.  02/22/16  Yes Unk Pinto, MD  lactulose (CHRONULAC) 10 GM/15ML solution Take 30 mLs (20 g  total) by mouth 3 (three) times daily. 04/03/16  Yes Courtney Forcucci, PA-C  levothyroxine (SYNTHROID, LEVOTHROID) 150 MCG tablet TAKE ONE TO ONE & ONE-HALF TABLETS BY MOUTH ONCE DAILY AS DIRECTED 03/23/16  Yes Unk Pinto, MD  linagliptin (TRADJENTA) 5 MG TABS tablet Take 1 tablet (5 mg total) by mouth daily. 12/27/15  Yes Vicie Mutters, PA-C  triamcinolone cream (KENALOG) 0.5 % Apply 1 application topically 2 (two) times daily. Patient taking differently: Apply 1 application topically 2 (two) times daily as needed (IRRITATION).  12/27/15  Yes Vicie Mutters, PA-C  alendronate (FOSAMAX) 70 MG tablet Take 1 tablet (70 mg total) by mouth once a week. Patient taking differently: Take 70 mg by mouth every Saturday.  12/27/15   Vicie Mutters, PA-C  Cholecalciferol (VITAMIN D) 2000 UNITS tablet Take 4,000 Units by mouth 2 (two) times daily.    Historical Provider, MD  Exenatide ER 2 MG PEN Inject 1 pen into the skin once a week. 04/24/16   Courtney Forcucci, PA-C  sodium bicarbonate 650 MG tablet Take 2 tablets (1,300 mg total) by mouth daily. 03/23/16   Florencia Reasons, MD     Vital Signs: BP 123/67 (BP Location: Right Arm)   Pulse 74   Temp 97.6 F (36.4 C) (Oral)   Resp 16   SpO2 99%   Physical Exam awake, alert. Chest clear to auscultation bilaterally. Heart with regular rate and rhythm,? soft murmur. Abdomen soft, positive bowel sounds, nontender. Lower extremities with trace to 1+ edema bilat  Imaging: No results found.  Labs:  CBC:  Recent Labs  03/21/16 0510 03/22/16 8657 04/03/16 1502  05/01/16 0750  WBC 3.2* 3.2* 2.8* 2.9*  HGB 9.7* 9.8* 10.9* 11.9*  HCT 27.4* 27.7* 33.2* 34.0*  PLT 77* 72* 130* 95*    COAGS:  Recent Labs  03/18/16 2114 05/01/16 0750  INR 1.26 1.22  APTT 31 32    BMP:  Recent Labs  03/21/16 0510 03/22/16 0821 03/23/16 0622 04/03/16 1502 05/01/16 0750  NA 132* 133* 135 138 137  K 2.9* 3.5 3.2* 4.1 4.1  CL 109 109 110 105 108  CO2 15* 16*  17* 25 21*  GLUCOSE 131* 134* 118* 141* 222*  BUN 8 8 9 9 9   CALCIUM 8.3* 8.3* 8.4* 9.6 9.0  CREATININE 0.69 0.70 0.56 0.65 0.63  GFRNONAA >60 >60 >60  --  >60  GFRAA >60 >60 >60  --  >60    LIVER FUNCTION TESTS:  Recent Labs  03/21/16 0510 03/22/16 0821 04/03/16 1502 05/01/16 0750  BILITOT 1.6* 2.1* 1.1 1.4*  AST 50* 44* 86* 59*  ALT 34 31 68* 48  ALKPHOS 44 44 118 96  PROT 5.9* 6.0* 6.5 6.6  ALBUMIN 3.6 3.3* 3.4* 3.4*    Assessment and Plan: Patient with history of NASH cirrhosis and segment 8 hepatocellular carcinoma. Remote history of right breast cancer 2008. Seen recently in consultation by Dr. Earleen Newport and deemed an appropriate candidate for staged hepatic bland tumor embolization followed by thermal ablation at later date. She presents today for hepatic/mesenteric arteriogram with possible embolization. Details/risks of procedure, including but not limited to bleeding, infection, arterial injury/dissection, contrast reaction, kidney injury nontarget embolization, postembolization syndrome, gastrointestinal ulcer, cardiopulmonary collapse, death d/w pt/husband with their understanding and consent.   Electronically Signed: D. Rowe Robert 05/01/2016, 9:04 AM   I spent a total of 25 minutes at the the patient's bedside AND on the patient's hospital floor or unit, greater than 50% of which was counseling/coordinating care for hepatic/mesenteric arteriogram with possible embolization

## 2016-05-01 NOTE — Progress Notes (Signed)
Patient ID: Beverly Davis, female   DOB: 02/15/1941, 75 y.o.   MRN: [id number] Patient states epigastric discomfort has improved since given IV Toradol; now eating peanut butter and crackers. She denies nausea/ vomiting Vital signs stable, afebrile Abdomen soft, mildly tender epigastric region, positive bowel sounds Puncture site right common femoral artery soft, clean, dry, nontender, no hematoma; intact distal pulses Assessment /plan: 4 cm right HCC,s/p mesenteric angiogram with embolization of right hepatic lobe HCC with iodine impregnated lumi beads earlier today; plan DC to home today; pt given prescriptions for Levaquin 500 mg once daily for 1 week as well as Protonix 40 mg daily for the next 3 months. Patient will be scheduled for CT-guided thermal ablation of [place] in next upcoming weeks once schedule allows. She was told to contact our service in the interim with any additional questions.  Rowe Robert, [facility]

## 2016-05-01 NOTE — Discharge Instructions (Signed)
Embolization Embolization is a procedure to block one or more of your blood vessels. This procedure may be done to stop bleeding inside your body or to cut off the blood supply to an abnormal growth of blood vessels or a tumor. Embolization may also be used to treat blood vessels that have become weak and are leaking or in danger of rupturing (aneurysm). A type of medication or synthetic material (embolic agents) that is injected into an artery or vein through a long plastic tube (catheter) is used to stop the flow of blood.  LET [facility] CARE PROVIDER KNOW ABOUT  Any allergies you have.  All medicines you are taking, including vitamins, herbs, eye drops, creams, and over-the-counter medicines.  Previous problems you or members of your family have had with the use of anesthetics.  Any blood disorders you have.  Previous surgeries you have had.  Medical conditions you have, especially diabetes or kidney problems. RISKS AND COMPLICATIONS  Generally, this is a safe procedure. However, as with any procedure, problems can occur. Possible problems include:  Allergic reaction.  Infection.  Swelling, bleeding, or bruising at the puncture site.  Blood clots.  Damage to the blood vessel.  Kidney damage. BEFORE THE PROCEDURE Before the procedure, you may have blood tests to make sure your kidneys and liver are working well. These tests can also check to see if your blood is clotting like it should. If you take medicine to keep your blood from clotting (anticoagulants), ask your health care provider when you should stop taking them.  You may need to stop eating and drinking for 6-8 hours before the procedure. Ask your health care provider. Make arrangements for someone to take you home. Depending on the procedure, you may be able to go home the same day. However, most people stay overnight in the hospital after this procedure. Ask your health care provider what to  expect. PROCEDURE Embolization usually takes about 90 minutes. The procedure may vary depending on which part of your body is being treated, but usually the following things will be done:  You may be given medicine that makes you go to sleep (general anesthetic) or a medicine that numbs only a particular area (local anesthetic). If you are given a local anesthetic, you may also be given medicine to make you relaxed and sleepy (sedative).  An IV tube will be inserted into your body. Medicine will flow through this tube.  A needle will be inserted into one of the large blood vessels in your groin (femoral blood vessel).  A catheter will be inserted into the needle and guided to the area that needs to be treated.  A dye will be injected through the IV tube and X-rays will be taken. This helps to show the exact location of the blood vessels that are causing the problem.  The embolic agent will then be injected into the blood vessel.  More X-rays will be taken to make sure the blood vessel has been closed.  The catheter will be removed and pressure will be applied to the incision to stop any bleeding.  A bandage (dressing) will be applied. AFTER THE PROCEDURE  You will stay in a recovery area until the anesthesia has worn off. Your blood pressure and pulse will be checked.  You may also get medication to make you feel comfortable if youhave pain, headache, or nausea after the procedure.  You will need to remain lying down for 6-8 hours. This may mean you have  to stay overnight in the hospital. People usually can leave the hospital within 24 hours after the procedure. This information is not intended to replace advice given to you by your health care provider. Make sure you discuss any questions you have with your health care provider. Document Released: 03/16/2013 Document Reviewed: 03/16/2013 Elsevier Interactive Patient Education  2017 [place]. Embolization, Care After Refer to  this sheet in the next few weeks. These instructions provide you with information on caring for yourself after your procedure. Your health care provider may also give you more specific instructions. Your treatment has been planned according to current medical practices, but problems sometimes occur. Call your health care provider if you have any problems or questions after your procedure. WHAT TO EXPECT AFTER THE PROCEDURE After your procedure, it is typical to have the following:   Pain, swelling, or bruising at the puncture site.  Headache.  Loss of appetite, nausea, or vomiting.  Fever. HOME CARE INSTRUCTIONS   Take medicine only as directed by your health care provider.  You can eat what you usually do, but be sure to include lots of fruits, vegetables, and whole grains in your diet. This helps prevent constipation.  Drink enough fluids to keep your urine clear or pale yellow.  Take two 10-minute walks each day.  Do not lift anything heavier than 10 pounds for 1 week after the procedure.  Do not drive for 24 hours after the procedure.  Do not take a bath for 5 days after the procedure. It is okay to take a shower.  You may be able to get back to all your normal activities within 1 week after your procedure. Ask your health care provider when you can return to sexual activity.  Keep all your follow-up appointments. SEEK MEDICAL CARE IF:  Your pain medicine is not helping.  You have a fever.  You have nausea or vomiting.  You have new bruising or swelling in your groin. SEEK IMMEDIATE MEDICAL CARE IF:  You have a fever or persistent symptoms for longer than 3 days.  You have pain in your legs.  You develop swelling and discoloration in your legs.  Your legs become pale and cold or blue.  You develop shortness of breath, feel faint, or pass out.  You have chest pain or trouble breathing.  You develop a cough or cough up blood.  You develop a rash.  You have  side effects from your medicine.  You feel weak or have trouble moving your arms or legs.  You have balance problems.  You have speech or vision problems. This information is not intended to replace advice given to you by your health care provider. Make sure you discuss any questions you have with your health care provider. Document Released: 03/16/2013 Document Reviewed: 03/16/2013 Elsevier Interactive Patient Education  2017 [place]. Femoral Site Care Introduction Refer to this sheet in the next few weeks. These instructions provide you with information about caring for yourself after your procedure. Your health care provider may also give you more specific instructions. Your treatment has been planned according to current medical practices, but problems sometimes occur. Call your health care provider if you have any problems or questions after your procedure. What can I expect after the procedure? After your procedure, it is typical to have the following:  Bruising at the site that usually fades within 1-2 weeks.  Blood collecting in the tissue (hematoma) that may be painful to the touch. It should  usually decrease in size and tenderness within 1-2 weeks. Follow these instructions at home:  Take medicines only as directed by your health care provider.  You may shower 24-48 hours after the procedure or as directed by your health care provider. Remove the bandage (dressing) and gently wash the site with plain soap and water. Pat the area dry with a clean towel. Do not rub the site, because this may cause bleeding.  Do not take baths, swim, or use a hot tub until your health care provider approves.  Check your insertion site every day for redness, swelling, or drainage.  Do not apply powder or lotion to the site.  Limit use of stairs to twice a day for the first 2-3 days or as directed by your health care provider.  Do not squat for the first 2-3 days or as directed by your  health care provider.  Do not lift over 10 lb (4.5 kg) for 5 days after your procedure or as directed by your health care provider.  Ask your health care provider when it is okay to:  Return to work or school.  Resume usual physical activities or sports.  Resume sexual activity.  Do not drive home if you are discharged the same day as the procedure. Have someone else drive you.  You may drive 24 hours after the procedure unless otherwise instructed by your health care provider.  Do not operate machinery or power tools for 24 hours after the procedure or as directed by your health care provider.  If your procedure was done as an outpatient procedure, which means that you went home the same day as your procedure, a responsible adult should be with you for the first 24 hours after you arrive home.  Keep all follow-up visits as directed by your health care provider. This is important. Contact a health care provider if:  You have a fever.  You have chills.  You have increased bleeding from the site. Hold pressure on the site. Get help right away if:  You have unusual pain at the site.  You have redness, warmth, or swelling at the site.  You have drainage (other than a small amount of blood on the dressing) from the site.  The site is bleeding, and the bleeding does not stop after 30 minutes of holding steady pressure on the site.  Your leg or foot becomes pale, cool, tingly, or numb. This information is not intended to replace advice given to you by your health care provider. Make sure you discuss any questions you have with your health care provider. Document Released: 11/12/2013 Document Revised: 08/17/2015 Document Reviewed: 09/28/2013  2017 Elsevier Moderate Conscious Sedation, Adult, Care After These instructions provide you with information about caring for yourself after your procedure. Your health care provider may also give you more specific instructions. Your  treatment has been planned according to current medical practices, but problems sometimes occur. Call your health care provider if you have any problems or questions after your procedure. What can I expect after the procedure? After your procedure, it is common:  To feel sleepy for several hours.  To feel clumsy and have poor balance for several hours.  To have poor judgment for several hours.  To vomit if you eat too soon. Follow these instructions at home: For at least 24 hours after the procedure:   Do not:  Participate in activities where you could fall or become injured.  Drive.  Use heavy machinery.  Drink  alcohol.  Take sleeping pills or medicines that cause drowsiness.  Make important decisions or sign legal documents.  Take care of children on your own.  Rest. Eating and drinking  Follow the diet recommended by your health care provider.  If you vomit:  Drink water, juice, or soup when you can drink without vomiting.  Make sure you have little or no nausea before eating solid foods. General instructions  Have a responsible adult stay with you until you are awake and alert.  Take over-the-counter and prescription medicines only as told by your health care provider.  If you smoke, do not smoke without supervision.  Keep all follow-up visits as told by your health care provider. This is important. Contact a health care provider if:  You keep feeling nauseous or you keep vomiting.  You feel light-headed.  You develop a rash.  You have a fever. Get help right away if:  You have trouble breathing. This information is not intended to replace advice given to you by your health care provider. Make sure you discuss any questions you have with your health care provider. Document Released: 12/30/2012 Document Revised: 08/14/2015 Document Reviewed: 07/01/2015 Elsevier Interactive Patient Education  2017 [employer].

## 2016-05-01 NOTE — Procedures (Signed)
Interventional Radiology Procedure Note  Procedure:   Mesenteric angiogram with embolization of right liver lobe HCC.  (Embo performed with iodine impregnated LUMI beads for tumor marking and bland embo purpose.)    Right CFA Exoseal for hemostasis.    Complications: None.   Recommendations:  - right leg straight x 4 hours - IV hydration with NS 150/hr x 4 hours. - Zofran prn - 4.5hour recovery, 5:40pm dc time - non-con CT abdomen now - Do not submerge for 7 days - Routine wound care  - DC meds: 7 days Levaquin 561m PO 1 daily, Protonix 445mdaily x 90 days.   Signed,  JaDulcy FannyWaEarleen NewportDO

## 2016-05-08 ENCOUNTER — Other Ambulatory Visit (HOSPITAL_BASED_OUTPATIENT_CLINIC_OR_DEPARTMENT_OTHER): Payer: PPO

## 2016-05-08 ENCOUNTER — Ambulatory Visit (HOSPITAL_BASED_OUTPATIENT_CLINIC_OR_DEPARTMENT_OTHER): Payer: PPO

## 2016-05-08 ENCOUNTER — Encounter: Payer: Self-pay | Admitting: Hematology & Oncology

## 2016-05-08 ENCOUNTER — Ambulatory Visit (HOSPITAL_BASED_OUTPATIENT_CLINIC_OR_DEPARTMENT_OTHER): Payer: PPO | Admitting: Hematology & Oncology

## 2016-05-08 VITALS — BP 117/65 | HR 64 | Temp 97.6°F | Resp 20 | Wt 142.0 lb

## 2016-05-08 DIAGNOSIS — K7682 Hepatic encephalopathy: Secondary | ICD-10-CM

## 2016-05-08 DIAGNOSIS — K7290 Hepatic failure, unspecified without coma: Secondary | ICD-10-CM | POA: Diagnosis not present

## 2016-05-08 DIAGNOSIS — C220 Liver cell carcinoma: Secondary | ICD-10-CM

## 2016-05-08 DIAGNOSIS — Z853 Personal history of malignant neoplasm of breast: Secondary | ICD-10-CM | POA: Diagnosis not present

## 2016-05-08 HISTORY — DX: Liver cell carcinoma: C22.0

## 2016-05-08 LAB — COMPREHENSIVE METABOLIC PANEL (CC13)
ALT: 65 IU/L — AB (ref 0–32)
AST (SGOT): 93 IU/L — ABNORMAL HIGH (ref 0–40)
Albumin, Serum: 3.3 g/dL — ABNORMAL LOW (ref 3.5–4.8)
Albumin/Globulin Ratio: 1 — ABNORMAL LOW (ref 1.2–2.2)
Alkaline Phosphatase, S: 141 IU/L — ABNORMAL HIGH (ref 39–117)
BUN/Creatinine Ratio: 14 (ref 12–28)
BUN: 10 mg/dL (ref 8–27)
Bilirubin Total: 0.7 mg/dL (ref 0.0–1.2)
CALCIUM: 9.4 mg/dL (ref 8.7–10.3)
Carbon Dioxide, Total: 27 mmol/L (ref 18–29)
Chloride, Ser: 102 mmol/L (ref 96–106)
Creatinine, Ser: 0.7 mg/dL (ref 0.57–1.00)
GFR, EST AFRICAN AMERICAN: 99 mL/min/{1.73_m2} (ref >59)
GFR, EST NON AFRICAN AMERICAN: 86 mL/min/{1.73_m2} (ref >59)
GLUCOSE: 274 mg/dL — AB (ref 65–99)
Globulin, Total: 3.3 g/dL (ref 1.5–4.5)
Potassium, Ser: 4.2 mmol/L (ref 3.5–5.2)
Sodium: 131 mmol/L — ABNORMAL LOW (ref 134–144)
TOTAL PROTEIN: 6.6 g/dL (ref 6.0–8.5)

## 2016-05-08 LAB — CBC WITH DIFFERENTIAL (CANCER CENTER ONLY)
BASO#: 0 10*3/uL (ref 0.0–0.2)
BASO%: 0.5 % (ref 0.0–2.0)
EOS ABS: 0.1 10*3/uL (ref 0.0–0.5)
EOS%: 2.8 % (ref 0.0–7.0)
HCT: 34.2 % — ABNORMAL LOW (ref 34.8–46.6)
HEMOGLOBIN: 11.8 g/dL (ref 11.6–15.9)
LYMPH#: 0.5 10*3/uL — ABNORMAL LOW (ref 0.9–3.3)
LYMPH%: 23.4 % (ref 14.0–48.0)
MCH: 36.1 pg — AB (ref 26.0–34.0)
MCHC: 34.5 g/dL (ref 32.0–36.0)
MCV: 105 fL — ABNORMAL HIGH (ref 81–101)
MONO#: 0.3 10*3/uL (ref 0.1–0.9)
MONO%: 11.7 % (ref 0.0–13.0)
NEUT%: 61.6 % (ref 39.6–80.0)
NEUTROS ABS: 1.3 10*3/uL — AB (ref 1.5–6.5)
Platelets: 104 10*3/uL — ABNORMAL LOW (ref 145–400)
RBC: 3.27 10*6/uL — AB (ref 3.70–5.32)
RDW: 12.8 % (ref 11.1–15.7)
WBC: 2.1 10*3/uL — AB (ref 3.9–10.0)

## 2016-05-08 LAB — LACTATE DEHYDROGENASE: LDH: 259 U/L — AB (ref 125–245)

## 2016-05-08 NOTE — Progress Notes (Signed)
Hematology and Oncology Follow Up Davis  Beverly Davis [id number] December 29, 1940 76 y.o. 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 I saw her in consultation back in late December. She was admitted for hepatic encephalopathy. She was found to have a 3 cm lesion in her liver. She has a long-standing history of NASH.  I felt that she would be a great candidate for intrahepatic therapy.  She was seen by interventional radiology. They have started her on treatment. She had radio embolization on February 7. She had iodine impregnated LUMI beads.  She will be set up for thermal ablation in about 6 weeks.  When I first saw her, her alpha-fetoprotein was 416. We are repeating this.  She actually looks quite good. She looks so much better than when I saw her in the hospital.  She's having no nausea or vomiting. She's having no change in bowel or bladder habits. There is no bleeding. She's having no issues with confusion. She's having no cough.  Overall, I say that her performance status is ECOG 1.  Medications:  Current Outpatient Prescriptions:  .  alendronate (FOSAMAX) 70 MG tablet, Take 1 tablet (70 mg total) by mouth once a week. (Patient taking differently: Take 70 mg by mouth every Saturday. ), Disp: 4 tablet, Rfl: 3 .  Cholecalciferol (VITAMIN D) 2000 UNITS tablet, Take 4,000 Units by mouth 2 (two) times daily., Disp: , Rfl:  .  glipiZIDE (GLUCOTROL) 5 MG tablet, TAKE ONE TABLET BY MOUTH THREE TIMES DAILY BEFORE MEAL(S). (Patient taking differently: Take 5 mg by mouth 2 (two) times daily before a meal. ), Disp: 270 tablet, Rfl: 0 .  lactulose (CHRONULAC) 10 GM/15ML solution, Take 30 mLs (20 g total) by mouth 3 (three) times daily.,  Disp: 240 mL, Rfl: 3 .  levothyroxine (SYNTHROID, LEVOTHROID) 150 MCG tablet, TAKE ONE TO ONE & ONE-HALF TABLETS BY MOUTH ONCE DAILY AS DIRECTED, Disp: 135 tablet, Rfl: 0 .  linagliptin (TRADJENTA) 5 MG TABS tablet, Take 1 tablet (5 mg total) by mouth daily., Disp: 30 tablet, Rfl: 3 .  triamcinolone cream (KENALOG) 0.5 %, Apply 1 application topically 2 (two) times daily. (Patient taking differently: Apply 1 application topically 2 (two) times daily as needed (IRRITATION). ), Disp: 80 g, Rfl: 2 .  Exenatide ER 2 MG PEN, Inject 1 pen into the skin once a week. (Patient not taking: Reported on 05/08/2016), Disp: 12 each, Rfl: 3 .  ONE TOUCH ULTRA TEST test strip, , Disp: , Rfl:  .  ONETOUCH DELICA LANCETS 93Y MISC, , Disp: , Rfl:  .  pantoprazole (PROTONIX) 40 MG tablet, 40 mg daily., Disp: , Rfl:   Allergies:  Allergies  Allergen Reactions  . Metformin And Related Other (See Comments)    TAKING MEDICATION CURRENTLY  . Welchol [Colesevelam Hcl] Other (See Comments)    Past Medical History, Surgical history, Social history, and Family History were reviewed and updated.  Review of Systems: As above  Physical Exam:  weight is 142 lb (64.4 kg). Her oral temperature is 97.6 F (36.4 C). Her blood pressure is 117/65 and her pulse is 64. Her respiration is 20.   Wt Readings from Last 3 Encounters:  05/08/16 142 lb (64.4 kg)  04/24/16  144 lb (65.3 kg)  04/03/16 140 lb (63.5 kg)     Well-developed and well-nourished white female in no obvious distress. Head and neck exam shows no ocular or oral lesions. There are no palpable cervical or supraclavicular lymph nodes. Lungs are clear bilaterally. Cardiac exam regular rate and rhythm with no murmurs, rubs or bruits. Breast exam shows left breast no masses, edema or erythema. There is no left axillary adenopathy. Right breast shows lumpectomy at the 10:00 position. She has some slight contraction secondary to past radiation. Some slight firmness is  noted at the lumpectomy site. There is no distinct mass in the right breast. There is no right axillary adenopathy. Abdomen is soft. Has good bowel sounds. There is no fluid wave. There is no palpable liver or spleen tip. Back exam shows no tenderness over the spine, ribs or hips. Extremities shows no clubbing, cyanosis or edema. Skin exam shows no rashes, ecchymoses or petechia. Neurological exam shows no focal neurological deficits.  Lab Results  Component Value Date   WBC 2.1 (L) 05/08/2016   HGB 11.8 05/08/2016   HCT 34.2 (L) 05/08/2016   MCV 105 (H) 05/08/2016   PLT 104 (L) 05/08/2016     Chemistry      Component Value Date/Time   NA 137 05/01/2016 0750   K 4.1 05/01/2016 0750   CL 108 05/01/2016 0750   CO2 21 (L) 05/01/2016 0750   BUN 9 05/01/2016 0750   CREATININE 0.63 05/01/2016 0750   CREATININE 0.65 04/03/2016 1502      Component Value Date/Time   CALCIUM 9.0 05/01/2016 0750   ALKPHOS 96 05/01/2016 0750   AST 59 (H) 05/01/2016 0750   ALT 48 05/01/2016 0750   BILITOT 1.4 (H) 05/01/2016 0750         Impression and Plan: Beverly Davis is a 76 year old white female with a recent diagnosis of localized hepatocellular carcinoma.  She has started treatment for this. She has had embolization. It sounds like she is going have RFA in March.  I don't see that we had to do any systemic therapy on her right now. I think that localized therapy would be reasonable. I think that the RFA should be a very good way of helping her.  We will see what her alpha-fetoprotein level is.  I don't have to worry about her breast cancer. This was early stage breast cancer. We will have to still follow this along. However, the hepatocellular carcinoma is more important right now.  I spent about 45 minutes with her. It was nice to see her out of the hospital and looking so good.  [name], MD 2/14/20185:25 PM

## 2016-05-09 LAB — AFP TUMOR MARKER: AFP, SERUM, TUMOR MARKER: 461.3 ng/mL — AB (ref 0.0–8.3)

## 2016-05-10 ENCOUNTER — Other Ambulatory Visit: Payer: Self-pay | Admitting: Internal Medicine

## 2016-05-10 DIAGNOSIS — E1165 Type 2 diabetes mellitus with hyperglycemia: Secondary | ICD-10-CM

## 2016-05-10 DIAGNOSIS — K7290 Hepatic failure, unspecified without coma: Secondary | ICD-10-CM

## 2016-05-10 DIAGNOSIS — K7682 Hepatic encephalopathy: Secondary | ICD-10-CM

## 2016-05-10 MED ORDER — DULAGLUTIDE 0.75 MG/0.5ML ~~LOC~~ SOAJ: 1.0000 | SUBCUTANEOUS | 0 refills | Status: DC

## 2016-05-10 NOTE — Progress Notes (Signed)
Patient calling with elevated blood sugars.  She will be placed on trulicity.  Doubt patient will be able to perform the injection on her own.  We will get her set up with home health so that she can get medication boxes set up and get the injection training done.

## 2016-05-17 ENCOUNTER — Other Ambulatory Visit (HOSPITAL_COMMUNITY): Payer: PPO

## 2016-05-22 ENCOUNTER — Ambulatory Visit (INDEPENDENT_AMBULATORY_CARE_PROVIDER_SITE_OTHER): Payer: PPO | Admitting: Internal Medicine

## 2016-05-22 ENCOUNTER — Encounter: Payer: Self-pay | Admitting: Internal Medicine

## 2016-05-22 VITALS — BP 116/64 | HR 72 | Temp 98.0°F | Ht 62.0 in

## 2016-05-22 DIAGNOSIS — N182 Chronic kidney disease, stage 2 (mild): Secondary | ICD-10-CM | POA: Diagnosis not present

## 2016-05-22 DIAGNOSIS — E1122 Type 2 diabetes mellitus with diabetic chronic kidney disease: Secondary | ICD-10-CM | POA: Diagnosis not present

## 2016-05-22 MED ORDER — PANTOPRAZOLE SODIUM 40 MG PO TBEC: 40.0000 mg | DELAYED_RELEASE_TABLET | Freq: Every day | ORAL | 1 refills | Status: DC

## 2016-05-22 MED ORDER — DULAGLUTIDE 0.75 MG/0.5ML ~~LOC~~ SOAJ: 1.0000 | SUBCUTANEOUS | 0 refills | Status: DC

## 2016-05-22 NOTE — Progress Notes (Signed)
Patient presents with her husband for training on trulicity pen.  She was initially prescribed bydureon Bcise and this was not covered on her formulary.  She reports that she has been doing okay with the exception that she has been having elevated blood sugars.  She is checking them regularly.  She reports that if she and her husband are not comfortable doing the shots they have a daughter who is in the medical field who can administrate her medication.    Patient was taught how to perform trulicity injection.  Both she and her husband demonstrated proficiency here.  Prescription was called in.  Patient given small sharps container.

## 2016-05-22 NOTE — Patient Instructions (Signed)
Please take your trulicity pen out and leave it on the counter top for 30 minutes.  Pull the gray cap off.  Please make sure that the pen is in the locked position.  Please put the pen flat against the skin.  Turn the pen to the unlocked position.  Please push the green button. Hold it flat against your skin for 15 seconds before you remove it.  The pen needs to go into a sharps container.    You will do this injection once weekly.

## 2016-05-31 ENCOUNTER — Other Ambulatory Visit: Payer: Self-pay | Admitting: Physician Assistant

## 2016-05-31 DIAGNOSIS — M810 Age-related osteoporosis without current pathological fracture: Secondary | ICD-10-CM

## 2016-06-04 ENCOUNTER — Other Ambulatory Visit: Payer: Self-pay | Admitting: Radiology

## 2016-06-12 ENCOUNTER — Ambulatory Visit (INDEPENDENT_AMBULATORY_CARE_PROVIDER_SITE_OTHER): Payer: PPO | Admitting: Internal Medicine

## 2016-06-12 ENCOUNTER — Encounter (HOSPITAL_COMMUNITY): Payer: Self-pay

## 2016-06-12 ENCOUNTER — Other Ambulatory Visit: Payer: Self-pay | Admitting: Radiology

## 2016-06-12 ENCOUNTER — Encounter (HOSPITAL_COMMUNITY)
Admission: RE | Admit: 2016-06-12 | Discharge: 2016-06-12 | Disposition: A | Discharge status: Home / Self Care | Payer: PPO | Source: Ambulatory Visit | Attending: Interventional Radiology | Admitting: Interventional Radiology

## 2016-06-12 ENCOUNTER — Encounter: Payer: Self-pay | Admitting: Internal Medicine

## 2016-06-12 VITALS — BP 114/66 | HR 68 | Temp 98.2°F | Resp 14 | Ht 62.0 in | Wt 142.0 lb

## 2016-06-12 DIAGNOSIS — E039 Hypothyroidism, unspecified: Secondary | ICD-10-CM | POA: Diagnosis not present

## 2016-06-12 DIAGNOSIS — M8580 Other specified disorders of bone density and structure, unspecified site: Secondary | ICD-10-CM | POA: Diagnosis not present

## 2016-06-12 DIAGNOSIS — C220 Liver cell carcinoma: Secondary | ICD-10-CM | POA: Diagnosis not present

## 2016-06-12 DIAGNOSIS — N182 Chronic kidney disease, stage 2 (mild): Secondary | ICD-10-CM | POA: Diagnosis not present

## 2016-06-12 DIAGNOSIS — Z7984 Long term (current) use of oral hypoglycemic drugs: Secondary | ICD-10-CM | POA: Diagnosis not present

## 2016-06-12 DIAGNOSIS — K7460 Unspecified cirrhosis of liver: Secondary | ICD-10-CM | POA: Diagnosis not present

## 2016-06-12 DIAGNOSIS — H5713 Ocular pain, bilateral: Secondary | ICD-10-CM | POA: Diagnosis not present

## 2016-06-12 DIAGNOSIS — Z01812 Encounter for preprocedural laboratory examination: Secondary | ICD-10-CM | POA: Insufficient documentation

## 2016-06-12 DIAGNOSIS — E1151 Type 2 diabetes mellitus with diabetic peripheral angiopathy without gangrene: Secondary | ICD-10-CM | POA: Diagnosis not present

## 2016-06-12 DIAGNOSIS — Z853 Personal history of malignant neoplasm of breast: Secondary | ICD-10-CM | POA: Diagnosis not present

## 2016-06-12 DIAGNOSIS — E1122 Type 2 diabetes mellitus with diabetic chronic kidney disease: Secondary | ICD-10-CM | POA: Diagnosis not present

## 2016-06-12 DIAGNOSIS — K7581 Nonalcoholic steatohepatitis (NASH): Secondary | ICD-10-CM | POA: Diagnosis not present

## 2016-06-12 DIAGNOSIS — I10 Essential (primary) hypertension: Secondary | ICD-10-CM | POA: Diagnosis not present

## 2016-06-12 HISTORY — DX: Hypothyroidism, unspecified: E03.9

## 2016-06-12 LAB — CBC WITH DIFFERENTIAL/PLATELET
BASOS ABS: 0 10*3/uL (ref 0.0–0.1)
BASOS PCT: 0 %
Eosinophils Absolute: 0 10*3/uL (ref 0.0–0.7)
Eosinophils Relative: 2 %
HEMATOCRIT: 33.2 % — AB (ref 36.0–46.0)
HEMOGLOBIN: 11.7 g/dL — AB (ref 12.0–15.0)
Lymphocytes Relative: 26 %
Lymphs Abs: 0.6 10*3/uL — ABNORMAL LOW (ref 0.7–4.0)
MCH: 35.8 pg — ABNORMAL HIGH (ref 26.0–34.0)
MCHC: 35.2 g/dL (ref 30.0–36.0)
MCV: 101.5 fL — ABNORMAL HIGH (ref 78.0–100.0)
Monocytes Absolute: 0.3 10*3/uL (ref 0.1–1.0)
Monocytes Relative: 12 %
NEUTROS ABS: 1.5 10*3/uL — AB (ref 1.7–7.7)
NEUTROS PCT: 60 %
Platelets: 87 10*3/uL — ABNORMAL LOW (ref 150–400)
RBC: 3.27 MIL/uL — ABNORMAL LOW (ref 3.87–5.11)
RDW: 13.6 % (ref 11.5–15.5)
WBC: 2.4 10*3/uL — AB (ref 4.0–10.5)

## 2016-06-12 LAB — COMPREHENSIVE METABOLIC PANEL
ALK PHOS: 88 U/L (ref 38–126)
ALT: 36 U/L (ref 14–54)
AST: 52 U/L — ABNORMAL HIGH (ref 15–41)
Albumin: 3.3 g/dL — ABNORMAL LOW (ref 3.5–5.0)
Anion gap: 4 — ABNORMAL LOW (ref 5–15)
BUN: 13 mg/dL (ref 6–20)
CALCIUM: 8.9 mg/dL (ref 8.9–10.3)
CO2: 25 mmol/L (ref 22–32)
CREATININE: 0.7 mg/dL (ref 0.44–1.00)
Chloride: 111 mmol/L (ref 101–111)
Glucose, Bld: 129 mg/dL — ABNORMAL HIGH (ref 65–99)
Potassium: 4.2 mmol/L (ref 3.5–5.1)
Sodium: 140 mmol/L (ref 135–145)
Total Bilirubin: 1 mg/dL (ref 0.3–1.2)
Total Protein: 6.4 g/dL — ABNORMAL LOW (ref 6.5–8.1)

## 2016-06-12 LAB — GLUCOSE, CAPILLARY: Glucose-Capillary: 140 mg/dL — ABNORMAL HIGH (ref 65–99)

## 2016-06-12 LAB — PROTIME-INR
INR: 1.19
PROTHROMBIN TIME: 15.2 s (ref 11.4–15.2)

## 2016-06-12 LAB — ABO/RH: ABO/RH(D): A POS

## 2016-06-12 NOTE — Progress Notes (Signed)
Assessment and Plan:   1. Type 2 diabetes mellitus with stage 2 chronic kidney disease, without long-term current use of insulin (HCC) -cont trulicity -appears to be doing well on GLP1 -has had improvement of her blood sugars.    HPI 76 y.o.female presents for 1 month follow up of new start trulicity.  She reports that she has been able to do the injections with the help of her husband and daughter. She has found her appetite has decreased, but no other real side effects. She is seeing a decrease in her blood sugars as well.  She was in the 200s prior to starting and is now somewhere between 130-140 in the morning.  She is happy with the medication thus far.  She would like to know if we have more samples.    Past Medical History:  Diagnosis Date  . Breast cancer ([place]) 05/2006   Right breast cancer  . Cirrhosis ([place])   . Diabetes mellitus without complication ([place])    type 2  . Fibroids   . Hepatocellular carcinoma ([place]) 05/08/2016  . Hypothyroidism   . Osteopenia   . Thrombocytopenia ([place])   . Thyroid disease      Allergies  Allergen Reactions  . Metformin And Related Other (See Comments)  . Welchol [Colesevelam Hcl] Other (See Comments)      Current Outpatient Prescriptions on File Prior to Visit  Medication Sig Dispense Refill  . Cholecalciferol (VITAMIN D) 2000 UNITS tablet Take 4,000 Units by mouth 2 (two) times daily.    Marland Kitchen glipiZIDE (GLUCOTROL) 5 MG tablet TAKE ONE TABLET BY MOUTH THREE TIMES DAILY BEFORE MEAL(S). 270 tablet 0  . lactulose (CHRONULAC) 10 GM/15ML solution Take 30 mLs (20 g total) by mouth 3 (three) times daily. 240 mL 3  . levothyroxine (SYNTHROID, LEVOTHROID) 150 MCG tablet TAKE ONE TO ONE & ONE-HALF TABLETS BY MOUTH ONCE DAILY AS DIRECTED 135 tablet 0  . linagliptin (TRADJENTA) 5 MG TABS tablet Take 1 tablet (5 mg total) by mouth daily. 30 tablet 3  . Omega-3 Fatty Acids (FISH OIL) 1200 MG CAPS Take 1 capsule by mouth daily.    . ONE TOUCH ULTRA TEST  test strip     . ONETOUCH DELICA LANCETS 81L MISC     . pantoprazole (PROTONIX) 40 MG tablet Take 1 tablet (40 mg total) by mouth daily. 90 tablet 1  . triamcinolone cream (KENALOG) 0.5 % Apply 1 application topically 2 (two) times daily. 80 g 2   No current facility-administered medications on file prior to visit.     ROS: all negative except above.   Physical Exam: Filed Weights   06/12/16 1602  Weight: 142 lb (64.4 kg)   BP 114/66   Pulse 68   Temp 98.2 F (36.8 C) (Temporal)   Resp 14   Ht 5' 2"  (1.575 m)   Wt 142 lb (64.4 kg)   BMI 25.97 kg/m  General Appearance: Well developed well nourished, non-toxic appearing in no apparent distress. Eyes: PERRLA, EOMs, conjunctiva w/ no swelling or erythema or discharge Sinuses: No Frontal/maxillary tenderness ENT/Mouth: Ear canals clear without swelling or erythema.  TM's normal bilaterally with no retractions, bulging, or loss of landmarks.   Neck: Supple, thyroid normal, no notable JVD  Respiratory: Respiratory effort normal, Clear breath sounds anteriorly and posteriorly bilaterally without rales, rhonchi, wheezing or stridor. No retractions or accessory muscle usage. Cardio: RRR with no MRGs.   Abdomen: Soft, + BS.  Non tender, no guarding, rebound, hernias,  masses.  Musculoskeletal: Full ROM, 5/5 strength, normal gait.  Skin: Warm, dry without rashes  Neuro: Awake and oriented X 3, Cranial nerves intact. Normal muscle tone, no cerebellar symptoms. Sensation intact.  Psych: normal affect, Insight and Judgment appropriate.     Starlyn Skeans, PA-C 4:21 PM [facility]

## 2016-06-12 NOTE — Progress Notes (Signed)
OV 05/22/16 epic 2 view CXR done12/25/17 epic EKG 03/18/16 epic

## 2016-06-12 NOTE — Patient Instructions (Signed)
Beverly Davis  06/12/2016   Your procedure is scheduled on: 06/14/16  Report to [facility] Main  Entrance to Radiology at 6:30 AM   Call this number if you have problems the morning of surgery [phone]   Remember: ONLY 1 PERSON MAY GO WITH YOU TO SHORT STAY TO GET  READY MORNING OF YOUR SURGERY.  Do not eat food or drink liquids :After Midnight.     Take these medicines the morning of surgery with A SIP OF WATER:syntrioid , protonix  DO NOT TAKE ANY DIABETIC MEDICATIONS DAY OF YOUR SURGERY               THE DAY BEFORE SURGERY ONLY TAKE MORNING AND LUNCH DOSE OF GLIPIZIDE                 You may not have any metal on your body including hair pins and              piercings  Do not wear jewelry, make-up, lotions, powders or perfumes, deodorant             Do not wear nail polish.  Do not shave  48 hours prior to surgery.               Do not bring valuables to the hospital. [place].  Contacts, dentures or bridgework may not be worn into surgery.  Leave suitcase in the car. After surgery it may be brought to your room.                  Please read over the following fact sheets you were given: _____________________________________________________________________            [facility] - Preparing for Surgery Before surgery, you can play an important role.  Because skin is not sterile, your skin needs to be as free of germs as possible.  You can reduce the number of germs on your skin by washing with CHG (chlorahexidine gluconate) soap before surgery.  CHG is an antiseptic cleaner which kills germs and bonds with the skin to continue killing germs even after washing. Please DO NOT use if you have an allergy to CHG or antibacterial soaps.  If your skin becomes reddened/irritated stop using the CHG and inform your nurse when you arrive at Short Stay. Do not shave (including legs and underarms) for at least  48 hours prior to the first CHG shower.  You may shave your face/neck. Please follow these instructions carefully:  1.  Shower with CHG Soap the night before surgery and the  morning of Surgery.  2.  If you choose to wash your hair, wash your hair first as usual with your  normal  shampoo.  3.  After you shampoo, rinse your hair and body thoroughly to remove the  shampoo.                           4.  Use CHG as you would any other liquid soap.  You can apply chg directly  to the skin and wash                       Gently with a scrungie or clean washcloth.  5.  Apply the CHG Soap to your body ONLY FROM THE NECK DOWN.   Do not use on face/ open                           Wound or open sores. Avoid contact with eyes, ears mouth and genitals (private parts).                       Wash face,  Genitals (private parts) with your normal soap.             6.  Wash thoroughly, paying special attention to the area where your surgery  will be performed.  7.  Thoroughly rinse your body with warm water from the neck down.  8.  DO NOT shower/wash with your normal soap after using and rinsing off  the CHG Soap.                9.  Pat yourself dry with a clean towel.            10.  Wear clean pajamas.            11.  Place clean sheets on your bed the night of your first shower and do not  sleep with pets. Day of Surgery : Do not apply any lotions/deodorants the morning of surgery.  Please wear clean clothes to the hospital/surgery center.  FAILURE TO FOLLOW THESE INSTRUCTIONS MAY RESULT IN THE CANCELLATION OF YOUR SURGERY PATIENT SIGNATURE_________________________________  NURSE SIGNATURE__________________________________  ________________________________________________________________________

## 2016-06-12 NOTE — Progress Notes (Signed)
Spoke with Dr. Conrad [place] at prop regarding consult at preop order . Gave him pt. Pertinent hx. Of type 2 DM. He said that anesthesia would see pt. Morning of surgery.

## 2016-06-13 NOTE — Anesthesia Preprocedure Evaluation (Addendum)
Anesthesia Evaluation  Patient identified by MRN, date of birth, ID band Patient awake    Reviewed: Allergy & Precautions, NPO status , Patient's Chart, lab work & pertinent test results  History of Anesthesia Complications Negative for: history of anesthetic complications  Airway Mallampati: II  TM Distance: >3 FB Neck ROM: Full    Dental no notable dental hx. (+) Dental Advisory Given   Pulmonary neg pulmonary ROS,    Pulmonary exam normal        Cardiovascular hypertension, + Peripheral Vascular Disease  Normal cardiovascular exam     Neuro/Psych negative neurological ROS  negative psych ROS   GI/Hepatic negative GI ROS,   Endo/Other  diabetesHypothyroidism   Renal/GU negative Renal ROS     Musculoskeletal negative musculoskeletal ROS (+)   Abdominal   Peds  Hematology negative hematology ROS (+)   Anesthesia Other Findings Day of surgery medications reviewed with the patient.  Reproductive/Obstetrics                            Anesthesia Physical Anesthesia Plan  ASA: III  Anesthesia Plan: General   Post-op Pain Management:    Induction: Intravenous  Airway Management Planned: Oral ETT  Additional Equipment:   Intra-op Plan:   Post-operative Plan: Extubation in OR  Informed Consent: I have reviewed the patients History and Physical, chart, labs and discussed the procedure including the risks, benefits and alternatives for the proposed anesthesia with the patient or authorized representative who has indicated his/her understanding and acceptance.   Dental advisory given  Plan Discussed with: CRNA, Anesthesiologist and Surgeon  Anesthesia Plan Comments:        Anesthesia Quick Evaluation

## 2016-06-14 ENCOUNTER — Ambulatory Visit (HOSPITAL_COMMUNITY)
Admission: RE | Admit: 2016-06-14 | Discharge: 2016-06-15 | Disposition: A | Discharge status: Home / Self Care | Payer: PPO | Source: Ambulatory Visit | Attending: Interventional Radiology | Admitting: Interventional Radiology

## 2016-06-14 ENCOUNTER — Encounter (HOSPITAL_COMMUNITY): Payer: Self-pay

## 2016-06-14 ENCOUNTER — Encounter (HOSPITAL_COMMUNITY)
Admission: RE | Disposition: A | Discharge status: Home / Self Care | Payer: Self-pay | Source: Ambulatory Visit | Attending: Interventional Radiology

## 2016-06-14 ENCOUNTER — Ambulatory Visit (HOSPITAL_COMMUNITY)
Admission: RE | Admit: 2016-06-14 | Discharge: 2016-06-14 | Disposition: A | Discharge status: Home / Self Care | Payer: PPO | Source: Ambulatory Visit | Attending: Interventional Radiology | Admitting: Interventional Radiology

## 2016-06-14 ENCOUNTER — Ambulatory Visit (HOSPITAL_COMMUNITY): Payer: PPO | Admitting: Certified Registered Nurse Anesthetist

## 2016-06-14 ENCOUNTER — Ambulatory Visit (HOSPITAL_COMMUNITY): Payer: PPO

## 2016-06-14 DIAGNOSIS — E039 Hypothyroidism, unspecified: Secondary | ICD-10-CM | POA: Diagnosis not present

## 2016-06-14 DIAGNOSIS — H5713 Ocular pain, bilateral: Secondary | ICD-10-CM | POA: Insufficient documentation

## 2016-06-14 DIAGNOSIS — C220 Liver cell carcinoma: Secondary | ICD-10-CM | POA: Diagnosis not present

## 2016-06-14 DIAGNOSIS — K7581 Nonalcoholic steatohepatitis (NASH): Secondary | ICD-10-CM | POA: Diagnosis not present

## 2016-06-14 DIAGNOSIS — Z853 Personal history of malignant neoplasm of breast: Secondary | ICD-10-CM | POA: Insufficient documentation

## 2016-06-14 DIAGNOSIS — I10 Essential (primary) hypertension: Secondary | ICD-10-CM | POA: Insufficient documentation

## 2016-06-14 DIAGNOSIS — K7460 Unspecified cirrhosis of liver: Secondary | ICD-10-CM | POA: Diagnosis not present

## 2016-06-14 DIAGNOSIS — E1151 Type 2 diabetes mellitus with diabetic peripheral angiopathy without gangrene: Secondary | ICD-10-CM | POA: Insufficient documentation

## 2016-06-14 DIAGNOSIS — Z7984 Long term (current) use of oral hypoglycemic drugs: Secondary | ICD-10-CM | POA: Diagnosis not present

## 2016-06-14 DIAGNOSIS — C228 Malignant neoplasm of liver, primary, unspecified as to type: Secondary | ICD-10-CM | POA: Diagnosis not present

## 2016-06-14 DIAGNOSIS — Z01818 Encounter for other preprocedural examination: Secondary | ICD-10-CM

## 2016-06-14 DIAGNOSIS — M8580 Other specified disorders of bone density and structure, unspecified site: Secondary | ICD-10-CM | POA: Diagnosis not present

## 2016-06-14 HISTORY — PX: RADIOFREQUENCY ABLATION: SHX2290

## 2016-06-14 LAB — GLUCOSE, CAPILLARY
Glucose-Capillary: 137 mg/dL — ABNORMAL HIGH (ref 65–99)
Glucose-Capillary: 139 mg/dL — ABNORMAL HIGH (ref 65–99)
Glucose-Capillary: 155 mg/dL — ABNORMAL HIGH (ref 65–99)
Glucose-Capillary: 175 mg/dL — ABNORMAL HIGH (ref 65–99)

## 2016-06-14 LAB — TYPE AND SCREEN
ABO/RH(D): A POS
Antibody Screen: NEGATIVE

## 2016-06-14 SURGERY — RADIO FREQUENCY ABLATION
Anesthesia: General

## 2016-06-14 MED ORDER — PIPERACILLIN-TAZOBACTAM 3.375 G IVPB: 3.3750 g | Freq: Once | INTRAVENOUS | Status: DC

## 2016-06-14 MED ORDER — SENNOSIDES-DOCUSATE SODIUM 8.6-50 MG PO TABS: 1.0000 | ORAL_TABLET | Freq: Every day | ORAL | Status: DC | PRN

## 2016-06-14 MED ORDER — LIDOCAINE 2% (20 MG/ML) 5 ML SYRINGE
INTRAMUSCULAR | Status: DC | PRN
  Administered 2016-06-14: 60 mg via INTRAVENOUS

## 2016-06-14 MED ORDER — PROPOFOL 10 MG/ML IV BOLUS
INTRAVENOUS | Status: DC | PRN
  Administered 2016-06-14: 100 mg via INTRAVENOUS

## 2016-06-14 MED ORDER — INSULIN ASPART 100 UNIT/ML ~~LOC~~ SOLN
0.0000 [IU] | Freq: Three times a day (TID) | SUBCUTANEOUS | Status: DC
  Administered 2016-06-14: 3 [IU] via SUBCUTANEOUS

## 2016-06-14 MED ORDER — PROMETHAZINE HCL 25 MG/ML IJ SOLN: 6.2500 mg | INTRAMUSCULAR | Status: DC | PRN

## 2016-06-14 MED ORDER — ERYTHROMYCIN 5 MG/GM OP OINT
TOPICAL_OINTMENT | Freq: Four times a day (QID) | OPHTHALMIC | Status: DC
  Administered 2016-06-14 (×2): via OPHTHALMIC
  Filled 2016-06-14: qty 3.5

## 2016-06-14 MED ORDER — ROCURONIUM BROMIDE 50 MG/5ML IV SOSY
PREFILLED_SYRINGE | INTRAVENOUS | Status: DC | PRN
  Administered 2016-06-14: 20 mg via INTRAVENOUS
  Administered 2016-06-14: 10 mg via INTRAVENOUS
  Administered 2016-06-14: 20 mg via INTRAVENOUS
  Administered 2016-06-14: 50 mg via INTRAVENOUS

## 2016-06-14 MED ORDER — LINAGLIPTIN 5 MG PO TABS
5.0000 mg | ORAL_TABLET | Freq: Every day | ORAL | Status: DC
  Administered 2016-06-14 – 2016-06-15 (×2): 5 mg via ORAL
  Filled 2016-06-14 (×2): qty 1

## 2016-06-14 MED ORDER — SUGAMMADEX SODIUM 200 MG/2ML IV SOLN
INTRAVENOUS | Status: DC | PRN
  Administered 2016-06-14: 200 mg via INTRAVENOUS

## 2016-06-14 MED ORDER — PHENYLEPHRINE HCL 10 MG/ML IJ SOLN
INTRAVENOUS | Status: DC | PRN
  Administered 2016-06-14: 50 ug/min via INTRAVENOUS

## 2016-06-14 MED ORDER — SODIUM CHLORIDE 0.9 % IV SOLN
INTRAVENOUS | Status: AC
  Administered 2016-06-14: 15:00:00 via INTRAVENOUS

## 2016-06-14 MED ORDER — HYDROCODONE-ACETAMINOPHEN 5-325 MG PO TABS
1.0000 | ORAL_TABLET | ORAL | Status: DC | PRN
  Administered 2016-06-14 – 2016-06-15 (×3): 1 via ORAL
  Filled 2016-06-14 (×3): qty 1

## 2016-06-14 MED ORDER — DEXAMETHASONE SODIUM PHOSPHATE 10 MG/ML IJ SOLN
INTRAMUSCULAR | Status: DC | PRN
  Administered 2016-06-14: 4 mg via INTRAVENOUS

## 2016-06-14 MED ORDER — FENTANYL CITRATE (PF) 100 MCG/2ML IJ SOLN
INTRAMUSCULAR | Status: DC | PRN
  Administered 2016-06-14 (×2): 50 ug via INTRAVENOUS
  Administered 2016-06-14 (×2): 25 ug via INTRAVENOUS

## 2016-06-14 MED ORDER — ONDANSETRON HCL 4 MG/2ML IJ SOLN: 4.0000 mg | Freq: Four times a day (QID) | INTRAMUSCULAR | Status: DC | PRN

## 2016-06-14 MED ORDER — LACTATED RINGERS IV SOLN
INTRAVENOUS | Status: DC
  Administered 2016-06-14 (×2): via INTRAVENOUS

## 2016-06-14 MED ORDER — ONDANSETRON HCL 4 MG/2ML IJ SOLN
INTRAMUSCULAR | Status: DC | PRN
  Administered 2016-06-14: 4 mg via INTRAVENOUS

## 2016-06-14 MED ORDER — LACTULOSE 10 GM/15ML PO SOLN
20.0000 g | Freq: Three times a day (TID) | ORAL | Status: DC
  Administered 2016-06-14 – 2016-06-15 (×3): 20 g via ORAL
  Filled 2016-06-14 (×2): qty 30

## 2016-06-14 MED ORDER — CEFAZOLIN SODIUM-DEXTROSE 2-4 GM/100ML-% IV SOLN
INTRAVENOUS | Status: AC
  Filled 2016-06-14: qty 100

## 2016-06-14 MED ORDER — IOPAMIDOL (ISOVUE-300) INJECTION 61%
INTRAVENOUS | Status: AC
  Administered 2016-06-14: 80 mL via INTRAVENOUS
  Filled 2016-06-14: qty 100

## 2016-06-14 MED ORDER — FENTANYL CITRATE (PF) 100 MCG/2ML IJ SOLN
INTRAMUSCULAR | Status: AC
  Filled 2016-06-14: qty 4

## 2016-06-14 MED ORDER — CEFAZOLIN SODIUM-DEXTROSE 2-3 GM-% IV SOLR
2.0000 g | Freq: Once | INTRAVENOUS | Status: AC
  Administered 2016-06-14: 2 g via INTRAVENOUS
  Filled 2016-06-14: qty 50

## 2016-06-14 MED ORDER — IOPAMIDOL (ISOVUE-300) INJECTION 61%
100.0000 mL | Freq: Once | INTRAVENOUS | Status: AC | PRN
  Administered 2016-06-14: 80 mL via INTRAVENOUS

## 2016-06-14 MED ORDER — HYDROMORPHONE HCL 1 MG/ML IJ SOLN: 0.2500 mg | INTRAMUSCULAR | Status: DC | PRN

## 2016-06-14 MED ORDER — LEVOTHYROXINE SODIUM 100 MCG PO TABS
150.0000 ug | ORAL_TABLET | Freq: Every day | ORAL | Status: DC
  Administered 2016-06-15: 150 ug via ORAL
  Filled 2016-06-14: qty 1

## 2016-06-14 MED ORDER — PANTOPRAZOLE SODIUM 40 MG PO TBEC
40.0000 mg | DELAYED_RELEASE_TABLET | Freq: Every day | ORAL | Status: DC
  Administered 2016-06-15: 40 mg via ORAL
  Filled 2016-06-14: qty 1

## 2016-06-14 MED ORDER — DOCUSATE SODIUM 100 MG PO CAPS
100.0000 mg | ORAL_CAPSULE | Freq: Two times a day (BID) | ORAL | Status: DC
  Filled 2016-06-14: qty 1

## 2016-06-14 MED ORDER — GLIPIZIDE 5 MG PO TABS
5.0000 mg | ORAL_TABLET | Freq: Once | ORAL | Status: AC
  Administered 2016-06-14: 5 mg via ORAL
  Filled 2016-06-14: qty 1

## 2016-06-14 MED ORDER — PHENYLEPHRINE HCL 10 MG/ML IJ SOLN
INTRAMUSCULAR | Status: DC | PRN
  Administered 2016-06-14 (×3): 80 ug via INTRAVENOUS

## 2016-06-14 NOTE — Progress Notes (Signed)
Patient ID: Beverly Davis, female   DOB: 11/23/1940, 76 y.o.   MRN: [id number] Patient complaining of inability to open eyes secondary to pain- ? related to eye lubricant applied by anesthesia staff during case; also has some mild right upper quadrant discomfort as expected. No HA,CP,N/V. VSS;AF Puncture site right upper quadrant abdomen clean, dry, intact gauze dressing, mildly tender to palpation; abdomen soft. Both eyelids can be manually retracted but elicits pain from patient; some mild conjunctival erythema noted. A/P: Status post CT-guided thermal ablation of segment 8 HCC earlier today- for overnight observation; check am labs; anesthesia to follow-up with eye exam; patient tent scheduled for follow-up in IR clinic with Dr. Earleen Newport in one month   [facility]

## 2016-06-14 NOTE — Addendum Note (Signed)
Addendum  created 06/14/16 1744 by Effie Berkshire, MD   Order list changed, Sign clinical note

## 2016-06-14 NOTE — Transfer of Care (Signed)
Immediate Anesthesia Transfer of Care Note  Patient: Beverly Davis  Procedure(s) Performed: Procedure(s): LIVER MICROWAVE THERMAL  ABLATION (N/A)  Patient Location: PACU  Anesthesia Type:General  Level of Consciousness:  sedated, patient cooperative and responds to stimulation  Airway & Oxygen Therapy:Patient Spontanous Breathing and Patient connected to face mask oxgen  Post-op Assessment:  Report given to PACU RN and Post -op Vital signs reviewed and stable  Post vital signs:  Reviewed and stable  Last Vitals: There were no vitals filed for this visit.  Complications: No apparent anesthesia complications

## 2016-06-14 NOTE — Progress Notes (Signed)
Progress Note:  Re: Bilateral Eye Pain  S:   Beverly Davis is resting comfortably. After being aroused, she complained of bilateral generalized eye pain. She describes a "gritty" feeling in each eye. She denies HA, blurry vision and light sensitivity upon opening eyes with encouragement.  O:  AF, VSS Gen: NAD, AOx3 HEENT: Eye movement intact, Pupil reacts to light, no scleral edema, some conjunctival erythema.   A/P:  Beverly Davis is 76 y/o F s/p thermal ablation by Dr. Earleen Newport on 3/23 complaining of bilateral generalized eye pain. Dr. Tobias Alexander evaluated in PACU. Per his note, eye lubrication was applied during procedure and he has low suspicion for corneal abrasion. He believes her generalized discomfort may be stemming from the eye lubrication. At this time, I agree with his assessment due to the nature of the pain and my inability to perform a full exam at this time secondary to patient cooperation.   I recommend the patient attempt to open her eyes periodically to assist with removing any remaining lubricant. I will place an order for erythromycin opthalmic solution to be applied q6h for up to 3 days. Beverly Davis may also flush each eye q4h PRN with saline flush and apply warm compresses. If Beverly Davis does not note any improvement by 0800 on 06/15/2016, I recommend the primary team consult opthalmology for further evaluation and recommendations.   Beverly Sickles Smith Robert, MD, [facility]est[facility]

## 2016-06-14 NOTE — Anesthesia Postprocedure Evaluation (Addendum)
Anesthesia Post Note  Patient: Beverly Davis  Procedure(s) Performed: Procedure(s) (LRB): LIVER MICROWAVE THERMAL  ABLATION (N/A)  Patient location during evaluation: PACU Anesthesia Type: General Level of consciousness: sedated Pain management: pain level controlled Vital Signs Assessment: post-procedure vital signs reviewed and stable Respiratory status: spontaneous breathing and respiratory function stable Cardiovascular status: stable Anesthetic complications: no Comments: Pt reports difficulty opening eyes.  I was able to open them manually.  Report normal vision.  No erythema.  Pt received lubricant and eyes were taped during procedure.  No apparent problems with anesthetic.  I believe this should resolve without further treatment.       Last Vitals:  Vitals:   06/14/16 1400 06/14/16 1415  BP: 136/70 (!) 142/71  Pulse: 79 83  Resp: 12 13  Temp:  36.3 C    Last Pain:  Vitals:   06/14/16 1415  PainSc: 0-No pain                 Elliyah Liszewski DANIEL

## 2016-06-14 NOTE — Procedures (Signed)
Interventional Radiology Procedure Note  Procedure:  Second step of combo treatment for Beverly Davis.  Image guided ablation of right liver HCC.  2 Neuwave Microwave antennas, both 17G, one 20cm, one 15cm.  10 minute treatment at 65W.   GETA  Findings: Treatment changes of prior Lumi bead bland embolization.  Post tx: Vaporized tissue centralized in the tumor with adequate treatment margin.   Complications: None  EBL: None.  Recommendations:  - To PACU for recovery - Routine wound care - 23 hour obs.  VIR will round in the am - Do not submerge for 7 days - Follow up visit with Dr. Earleen Newport in ~4 weeks.    Signed,  Dulcy Fanny. Earleen Newport, DO

## 2016-06-14 NOTE — Anesthesia Procedure Notes (Signed)
Procedure Name: Intubation Date/Time: 06/14/2016 9:04 AM Performed by: West Pugh Pre-anesthesia Checklist: Patient identified, Emergency Drugs available, Suction available, Patient being monitored and Timeout performed Patient Re-evaluated:Patient Re-evaluated prior to inductionOxygen Delivery Method: Circle system utilized Preoxygenation: Pre-oxygenation with 100% oxygen Intubation Type: IV induction Ventilation: Mask ventilation without difficulty Laryngoscope Size: Mac and 3 Grade View: Grade II Tube type: Oral Tube size: 7.0 mm Airway Equipment and Method: Stylet Placement Confirmation: ETT inserted through vocal cords under direct vision,  positive ETCO2,  CO2 detector and breath sounds checked- equal and bilateral Secured at: 21 cm Tube secured with: Tape Dental Injury: Teeth and Oropharynx as per pre-operative assessment

## 2016-06-14 NOTE — H&P (Signed)
Referring Physician(s): Ennever,P  Supervising Physician: Corrie Mckusick  Patient Status:  [facility] OP TBA  Chief Complaint:  Hepatocellular carcinoma  Subjective: Pt familiar to IR service from recent consultation with Dr. Earleen Newport on 03/28/16 to discuss treatment options for a 4 cm segment 8 hepatocellular carcinoma. See full consult note for additional details. She has a history of NASH cirrhosis and remote history of right breast cancer 2008. Recent imaging has revealed approximately 4 cm segment 8 hepatocellular carcinoma. Following evaluation by Dr. Earleen Newport she was deemed an appropriate candidate for bland hepatic tumor embolization followed by radiofrequency ablation at later date. She underwent mesenteric arteriogram with embolization of right hepatic lobe HCC with iodine impregnated Lumi beads on 05/01/16. She presents today for CT-guided radiofrequency/thermal ablation of the [facility]. She currently denies fever, headache, chest pain, dyspnea, cough, abdominal pain, neck pain, nausea, vomiting or abnormal bleeding.  Past Medical History:  Diagnosis Date  . Breast cancer ([place]) 05/2006   Right breast cancer  . Cirrhosis ([place])   . Diabetes mellitus without complication ([place])    type 2  . Fibroids   . Hepatocellular carcinoma ([place]) 05/08/2016  . Hypothyroidism   . Osteopenia   . Thrombocytopenia ([place])   . Thyroid disease    Past Surgical History:  Procedure Laterality Date  . ABDOMINAL HYSTERECTOMY     TAH BSO  . BREAST LUMPECTOMY WITH AXILLARY LYMPH NODE BIOPSY Right 06/25/2006  . CHOLECYSTECTOMY    . IR GENERIC HISTORICAL  03/28/2016   IR RADIOLOGIST EVAL & MGMT 03/28/2016 Corrie Mckusick, DO [facility] INTERV RAD  . IR GENERIC HISTORICAL  05/01/2016   IR ANGIOGRAM VISCERAL SELECTIVE 05/01/2016 Corrie Mckusick, DO [facility]  . IR GENERIC HISTORICAL  05/01/2016   IR ANGIOGRAM VISCERAL SELECTIVE 05/01/2016 Corrie Mckusick, DO [facility]  . IR GENERIC HISTORICAL  05/01/2016   IR ANGIOGRAM SELECTIVE EACH  ADDITIONAL VESSEL 05/01/2016 Corrie Mckusick, DO [facility]  . IR GENERIC HISTORICAL  05/01/2016   IR ANGIOGRAM SELECTIVE EACH ADDITIONAL VESSEL 05/01/2016 Corrie Mckusick, DO [facility]  . IR GENERIC HISTORICAL  05/01/2016   IR ANGIOGRAM SELECTIVE EACH ADDITIONAL VESSEL 05/01/2016 Corrie Mckusick, DO [facility]  . IR GENERIC HISTORICAL  05/01/2016   IR EMBO TUMOR ORGAN ISCHEMIA INFARCT INC GUIDE ROADMAPPING 05/01/2016 Corrie Mckusick, DO [facility]  . IR GENERIC HISTORICAL  05/01/2016   IR CT SPINE LTD 05/01/2016 Corrie Mckusick, DO [facility]  . IR GENERIC HISTORICAL  05/01/2016   IR US GUIDE VASC ACCESS RIGHT 05/01/2016 Corrie Mckusick, DO [facility]  . IR GENERIC HISTORICAL  05/01/2016   IR ANGIOGRAM SELECTIVE EACH ADDITIONAL VESSEL 05/01/2016 Corrie Mckusick, DO [facility]  . TUBAL LIGATION  1978    Allergies: Metformin and related and Welchol [colesevelam hcl]  Medications: Prior to Admission medications   Medication Sig Start Date End Date Taking? Authorizing Provider  Cholecalciferol (VITAMIN D) 2000 UNITS tablet Take 4,000 Units by mouth 2 (two) times daily.   Yes Historical Provider, MD  glipiZIDE (GLUCOTROL) 5 MG tablet TAKE ONE TABLET BY MOUTH THREE TIMES DAILY BEFORE MEAL(S). 02/22/16  Yes Unk Pinto, MD  lactulose (CHRONULAC) 10 GM/15ML solution Take 30 mLs (20 g total) by mouth 3 (three) times daily. 04/03/16  Yes Courtney Forcucci, PA-C  levothyroxine (SYNTHROID, LEVOTHROID) 150 MCG tablet TAKE ONE TO ONE & ONE-HALF TABLETS BY MOUTH ONCE DAILY AS DIRECTED 03/23/16  Yes Unk Pinto, MD  linagliptin (TRADJENTA) 5 MG TABS tablet Take 1 tablet (5 mg total) by  mouth daily. 12/27/15  Yes Vicie Mutters, PA-C  Omega-3 Fatty Acids (FISH OIL) 1200 MG CAPS Take 1 capsule by mouth daily.   Yes Historical Provider, MD  ONE TOUCH ULTRA TEST test strip  04/22/16  Yes Historical Provider, MD  Jonetta Speak LANCETS 02I [place]  04/22/16  Yes Historical Provider, MD  pantoprazole (PROTONIX) 40 MG tablet Take  1 tablet (40 mg total) by mouth daily. 05/22/16  Yes Courtney Forcucci, PA-C  triamcinolone cream (KENALOG) 0.5 % Apply 1 application topically 2 (two) times daily. 12/27/15   Vicie Mutters, PA-C     Vital Signs: Ht 5' 2"  (1.575 m)   Wt 140 lb (63.5 kg)   BMI 25.61 kg/m   Physical Exam awake, alert. Chest clear to auscultation bilaterally. Heart with regular rate and rhythm. Abdomen soft, positive bowel sounds, nontender. Lower extremities with trace edema bilaterally.  Imaging: Dg Chest 2 View  Result Date: 06/14/2016 CLINICAL DATA:  History of breast carcinoma. Preoperative ablation procedure for hepatocellular carcinoma. Diabetes mellitus. EXAM: CHEST  2 VIEW COMPARISON:  March 18, 2016 FINDINGS: There is a new 7 mm nodular opacity in the left apex region. Lungs elsewhere clear. Heart size and pulmonary vascularity are normal. No adenopathy. There is degenerative change in the thoracic spine. IMPRESSION: New subcentimeter nodular opacity left apex. A small metastasis must be of concern given the clinical history. This finding may warrant noncontrast chest CT to further assess. No edema or consolidation. No evident adenopathy. Electronically Signed   By: Lowella Grip III M.D.   On: 06/14/2016 08:01    Labs:  CBC:  Recent Labs  04/03/16 1502 05/01/16 0750 05/08/16 1342 06/12/16 1126  WBC 2.8* 2.9* 2.1* 2.4*  HGB 10.9* 11.9* 11.8 11.7*  HCT 33.2* 34.0* 34.2* 33.2*  PLT 130* 95* 104* 87*    COAGS:  Recent Labs  03/18/16 2114 05/01/16 0750 06/12/16 1126  INR 1.26 1.22 1.19  APTT 31 32  --     BMP:  Recent Labs  03/23/16 0622 04/03/16 1502 05/01/16 0750 05/08/16 1342 06/12/16 1126  NA 135 138 137 131* 140  K 3.2* 4.1 4.1 4.2 4.2  CL 110 105 108 102 111  CO2 17* 25 21* 27 25  GLUCOSE 118* 141* 222* 274* 129*  BUN 9 9 9 10 13   CALCIUM 8.4* 9.6 9.0 9.4 8.9  CREATININE 0.56 0.65 0.63 0.70 0.70  GFRNONAA >60  --  >60 86 >60  GFRAA >60  --  >60 99 >60     LIVER FUNCTION TESTS:  Recent Labs  04/03/16 1502 05/01/16 0750 05/08/16 1342 06/12/16 1126  BILITOT 1.1 1.4* 0.7 1.0  AST 86* 59* 93* 52*  ALT 68* 48 65* 36  ALKPHOS 118 96 141* 88  PROT 6.5 6.6 6.6 6.4*  ALBUMIN 3.4* 3.4* 3.3* 3.3*    Assessment and Plan: Patient with history of NASH cirrhosis, remote breast cancer, 4 cm segment 8 hepatocellular carcinoma, status post embolization with iodine impregnated Lumi beads on 05/01/16. She presents today for CT-guided radiofrequency/thermal ablation of the [facility]. Details/risks of procedure, including but not limited to, internal bleeding, infection, injury to adjacent structures, anesthesia-related complications discussed with patient with her understanding and consent. Post procedure she will be admitted for overnight observation.   Electronically Signed: D. Rowe Robert 06/14/2016, 8:39 AM   I spent a total of 30 minutes at the the patient's bedside AND on the patient's hospital floor or unit, greater than 50% of which was counseling/coordinating care for  CT-guided thermal ablation of right hepatic lobe [place]

## 2016-06-15 ENCOUNTER — Other Ambulatory Visit: Payer: Self-pay | Admitting: Radiology

## 2016-06-15 DIAGNOSIS — C220 Liver cell carcinoma: Secondary | ICD-10-CM | POA: Diagnosis not present

## 2016-06-15 LAB — CBC WITH DIFFERENTIAL/PLATELET
Basophils Absolute: 0 10*3/uL (ref 0.0–0.1)
Basophils Relative: 0 %
EOS PCT: 0 %
Eosinophils Absolute: 0 10*3/uL (ref 0.0–0.7)
HEMATOCRIT: 31.5 % — AB (ref 36.0–46.0)
Hemoglobin: 10.9 g/dL — ABNORMAL LOW (ref 12.0–15.0)
LYMPHS ABS: 0.4 10*3/uL — AB (ref 0.7–4.0)
LYMPHS PCT: 9 %
MCH: 34.9 pg — AB (ref 26.0–34.0)
MCHC: 34.6 g/dL (ref 30.0–36.0)
MCV: 101 fL — ABNORMAL HIGH (ref 78.0–100.0)
MONO ABS: 0.5 10*3/uL (ref 0.1–1.0)
Monocytes Relative: 11 %
NEUTROS ABS: 3.7 10*3/uL (ref 1.7–7.7)
Neutrophils Relative %: 79 %
PLATELETS: 80 10*3/uL — AB (ref 150–400)
RBC: 3.12 MIL/uL — AB (ref 3.87–5.11)
RDW: 13.7 % (ref 11.5–15.5)
WBC: 4.7 10*3/uL (ref 4.0–10.5)

## 2016-06-15 LAB — COMPREHENSIVE METABOLIC PANEL
ALT: 82 U/L — AB (ref 14–54)
AST: 200 U/L — ABNORMAL HIGH (ref 15–41)
Albumin: 2.9 g/dL — ABNORMAL LOW (ref 3.5–5.0)
Alkaline Phosphatase: 94 U/L (ref 38–126)
Anion gap: 6 (ref 5–15)
BILIRUBIN TOTAL: 1.8 mg/dL — AB (ref 0.3–1.2)
BUN: 14 mg/dL (ref 6–20)
CALCIUM: 8.4 mg/dL — AB (ref 8.9–10.3)
CO2: 24 mmol/L (ref 22–32)
Chloride: 110 mmol/L (ref 101–111)
Creatinine, Ser: 0.72 mg/dL (ref 0.44–1.00)
GFR calc non Af Amer: >60 mL/min (ref >60)
Glucose, Bld: 116 mg/dL — ABNORMAL HIGH (ref 65–99)
Potassium: 3.8 mmol/L (ref 3.5–5.1)
Sodium: 140 mmol/L (ref 135–145)
TOTAL PROTEIN: 6.1 g/dL — AB (ref 6.5–8.1)

## 2016-06-15 MED ORDER — ERYTHROMYCIN 5 MG/GM OP OINT
TOPICAL_OINTMENT | Freq: Four times a day (QID) | OPHTHALMIC | Status: DC
  Administered 2016-06-15: 07:00:00 via OPHTHALMIC

## 2016-06-15 MED ORDER — HYDROCODONE-ACETAMINOPHEN 5-325 MG PO TABS: 1.0000 | ORAL_TABLET | ORAL | 0 refills | Status: DC | PRN

## 2016-06-15 MED ORDER — TOBRAMYCIN 0.3 % OP SOLN
1.0000 [drp] | Freq: Four times a day (QID) | OPHTHALMIC | Status: DC
  Administered 2016-06-15: 1 [drp] via OPHTHALMIC
  Filled 2016-06-15: qty 5

## 2016-06-15 MED ORDER — TOBRAMYCIN 0.3 % OP SOLN: 1.0000 [drp] | Freq: Four times a day (QID) | OPHTHALMIC | 0 refills | Status: AC

## 2016-06-15 NOTE — Progress Notes (Signed)
Spoke with Dr. Earleen Newport and explained that patient has tolerated breakfast and lunch, she has voided, and she has ambulated and tolerated well.  Patient okay for discharge.  Discharge AVS reviewed with patient and her spouse using teach back method.  They demonstrated understanding of all instructions.  Patient informed that radiology office should be calling her on Monday or Tuesday to follow up on her vision changes.  She knows to call MD if vision worsens.  Patient stable for discharge home.  Beverly Davis [facility]  06/15/2016  12:17 PM

## 2016-06-15 NOTE — Discharge Instructions (Signed)
Thermal Ablation, Care After  This sheet gives you information about how to care for yourself after your procedure. Your health care provider may also give you more specific instructions. If you have problems or questions, contact your health care provider. What can I expect after the procedure? After the procedure, it is common to have:  Soreness around the treatment area.  Mild pain and swelling in the treatment area. Follow these instructions at home: Treatment area care    Follow instructions from your health care provider about how to take care of your incision. Make sure you:  Wash your hands with soap and water before you change your bandage (dressing). If soap and water are not available, use hand sanitizer.  Change your dressing as told by your health care provider.  Leave stitches (sutures) in place. They may need to stay in place for 2 weeks or longer.  Check your treatment area every day for signs of infection. Check for:  More redness, swelling, or pain.  More fluid or blood.  Warmth.  Pus or a bad smell.  Keep the treated area clean, dry, and covered with a dressing until it has healed. Clean the area with soap and water or as told by your health care provider.  You may shower if your health care provider approves. If your bandage gets wet, change it right away. Activity   Follow instructions from your health care provider about any activity limitations.  Do not drive for 24 hours if you received a medicine to help you relax (sedative). General instructions   Take over-the-counter and prescription medicines only as told by your health care provider.  Keep all follow-up visits as told by your health care provider. This is important. Contact a health care provider if:  You do not have a bowel movement for 2 days.  You have nausea or vomiting.  You have more redness, swelling, or pain around your treatment area.  You have more fluid or blood coming from  your treatment area.  Your treatment area feels warm to the touch.  You have pus or a bad smell coming from your treatment area.  You have a fever. Get help right away if:  You have severe pain.  You have trouble swallowing or breathing.  You have severe weakness or dizziness.  You have chest pain or shortness of breath.

## 2016-06-15 NOTE — Discharge Summary (Signed)
Patient ID: NARE GASPARI MRN: [id number] DOB/AGE: 10-25-1940 76 y.o.  Admit date: 06/14/2016 Discharge date: 06/15/2016  Supervising Physician: Markus Daft  Patient Status: [facility] - In-pt  Admission Diagnoses: Hepatocellular carcinoma  Discharge Diagnoses:  Active Problems:   Hepatocellular carcinoma [facility])   Discharged Condition: good  Hospital Course: Pt familiar to IR service from recent consultation with Dr. Earleen Newport on 1/4/18to discuss treatment options for a 75 cm segment 8hepatocellular carcinoma. See fullconsult note for additional details.She has a history of NASHcirrhosis and remote history of right breast cancer 2008. Recent imaging has revealed approximately4 cm segment 8 hepatocellular carcinoma. Following evaluation by Dr. Earleen Newport she was deemed an appropriate candidate for bland hepatic tumor embolizationfollowed by radiofrequency ablation atlater date. She underwent mesenteric arteriogram with embolization of right hepatic lobe HCC with iodine impregnated Lumi beads on 05/01/16. She presents today for CT-guided radiofrequency/thermal ablation of the [facility]. She underwent successful CT guided thermal ablation of right hepatic lobe HCC. No immediate procedure complications. The pt did c/o bilateral eye irritation. It was felt secondary to the opthalmic ointment placed by anesthesia. She was started on antibiotic drops and reports by morning of POD#1, it is much better. She is still having some 'fuzzy blurred vision' but no pain. She denies fb sensation. She otherwise is feeling well. Has some mild right sided discomfort at the procedure site and some mild inspiratory pain due to the percutaneous access location. She denies N/V or abd pain. She had a little to eat last night. Her foley is out and she has voided fine. She has a mild bump in her LFTs as expected. She is determined to be stable for discharge. She will be given Rx for pain medication in case she needs it. She will  also get an Rx for antibiotic eye drops to continue using for the next 5 days.  Consults: None  Treatments: CT guided thermal(microwave)ablation of (R)lobe [place]  Discharge Exam: Blood pressure (!) 122/49, pulse 76, temperature 97.8 F (36.6 C), temperature source Oral, resp. rate 16, height 5' 2"  (1.575 m), weight 140 lb (63.5 kg), SpO2 96 %. General: A&O x 3, NAD ENT: PERRLA, EOMI, no nystagmus. No conjunctival or scleral erythema Lungs: CTA without w/r/r Heart: Regular Abdomen: soft, NT. RUQ/ant chest perc access sites clean, no hematoma   Disposition: 01-Home or Self Care  Discharge Instructions    Call MD for:  difficulty breathing, headache or visual disturbances    Complete by:  As directed    Call MD for:  persistant nausea and vomiting    Complete by:  As directed    Call MD for:  redness, tenderness, or signs of infection (pain, swelling, redness, odor or green/yellow discharge around incision site)    Complete by:  As directed    Call MD for:  temperature >100.4    Complete by:  As directed    Diet - low sodium heart healthy    Complete by:  As directed    Driving Restrictions    Complete by:  As directed    Do not drive until vision clears and you are not needing pain medicine   Increase activity slowly    Complete by:  As directed    May shower / Bathe    Complete by:  As directed    No dressing needed    Complete by:  As directed    May remove dressing at home and shower     Allergies as of 06/15/2016  Reactions   Metformin And Related Other (See Comments)   Welchol [colesevelam Hcl] Other (See Comments)      Medication List    TAKE these medications   Fish Oil 1200 MG Caps Take 1 capsule by mouth daily.   glipiZIDE 5 MG tablet Commonly known as:  GLUCOTROL TAKE ONE TABLET BY MOUTH THREE TIMES DAILY BEFORE MEAL(S).   HYDROcodone-acetaminophen 5-325 MG tablet Commonly known as:  NORCO/VICODIN Take 1-2 tablets by mouth every 4 (four) hours as  needed for moderate pain.   lactulose 10 GM/15ML solution Commonly known as:  CHRONULAC Take 30 mLs (20 g total) by mouth 3 (three) times daily.   levothyroxine 150 MCG tablet Commonly known as:  SYNTHROID, LEVOTHROID TAKE ONE TO ONE & ONE-HALF TABLETS BY MOUTH ONCE DAILY AS DIRECTED   linagliptin 5 MG Tabs tablet Commonly known as:  TRADJENTA Take 1 tablet (5 mg total) by mouth daily.   ONE TOUCH ULTRA TEST test strip Generic drug:  glucose blood   ONETOUCH DELICA LANCETS 59F Misc   pantoprazole 40 MG tablet Commonly known as:  PROTONIX Take 1 tablet (40 mg total) by mouth daily.   tobramycin 0.3 % ophthalmic solution Commonly known as:  TOBREX Place 1 drop into both eyes every 6 (six) hours.   triamcinolone cream 0.5 % Commonly known as:  KENALOG Apply 1 application topically 2 (two) times daily.   Vitamin D 2000 units tablet Take 4,000 Units by mouth 2 (two) times daily.      Follow-up Information    WAGNER, JAIME, DO. Go in 1 month(s).   Specialty:  Interventional Radiology Why:  Office will call to schedule follow up appointment Contact information: [address] [place] [address] [phone]            Electronically Signed: Ascencion Dike 06/15/2016, 8:00 AM   I have spent Less Than 30 Minutes discharging [place].

## 2016-06-17 ENCOUNTER — Encounter (HOSPITAL_COMMUNITY): Payer: Self-pay | Admitting: Interventional Radiology

## 2016-06-17 LAB — GLUCOSE, CAPILLARY
GLUCOSE-CAPILLARY: 215 mg/dL — AB (ref 65–99)
Glucose-Capillary: 95 mg/dL (ref 65–99)

## 2016-06-28 ENCOUNTER — Other Ambulatory Visit: Payer: Self-pay | Admitting: Internal Medicine

## 2016-06-28 MED ORDER — GLIPIZIDE 5 MG PO TABS: ORAL_TABLET | ORAL | 0 refills | Status: DC

## 2016-07-16 ENCOUNTER — Other Ambulatory Visit: Payer: Self-pay | Admitting: Hematology & Oncology

## 2016-07-16 DIAGNOSIS — C220 Liver cell carcinoma: Secondary | ICD-10-CM

## 2016-07-17 DIAGNOSIS — K760 Fatty (change of) liver, not elsewhere classified: Secondary | ICD-10-CM | POA: Diagnosis not present

## 2016-07-17 DIAGNOSIS — K7460 Unspecified cirrhosis of liver: Secondary | ICD-10-CM | POA: Diagnosis not present

## 2016-07-22 ENCOUNTER — Telehealth: Payer: Self-pay | Admitting: Hematology & Oncology

## 2016-07-22 NOTE — Telephone Encounter (Signed)
Patient called and cx 07/24/16 apt and resch for 08/21/16

## 2016-07-24 ENCOUNTER — Ambulatory Visit: Payer: PPO | Admitting: Hematology & Oncology

## 2016-07-24 ENCOUNTER — Other Ambulatory Visit: Payer: PPO

## 2016-07-31 ENCOUNTER — Ambulatory Visit
Admission: RE | Admit: 2016-07-31 | Discharge: 2016-07-31 | Disposition: A | Discharge status: Home / Self Care | Payer: PPO | Source: Ambulatory Visit | Attending: Radiology | Admitting: Radiology

## 2016-07-31 DIAGNOSIS — C220 Liver cell carcinoma: Secondary | ICD-10-CM | POA: Diagnosis not present

## 2016-07-31 HISTORY — PX: IR RADIOLOGIST EVAL & MGMT: IMG5224

## 2016-07-31 NOTE — Progress Notes (Signed)
Chief Complaint: [facility] [place] 06/14/2016  Referring Physician(s): Burney Gauze  Supervising Physician: Corrie Mckusick  History of Present Illness: Beverly Davis is a 76 y.o. female who was initially seen in consultation with Dr. Earleen Newport on 1/4/18to discuss treatment options for a 4 cm segment 8hepatocellular carcinoma.   She has a history of NASHcirrhosis and remote history of right breast cancer 2008.   Recent imaging has revealed approximately4 cm segment 8 hepatocellular carcinoma.   Following evaluation by Dr. Earleen Newport she was deemed an appropriate candidate for bland hepatic tumor embolizationfollowed by radiofrequency ablation atlater date.   She underwent mesenteric arteriogram with embolization of right hepatic lobe HCC with iodine impregnated Lumi beads on 05/01/16.   She underwent a CT-guided radiofrequency/thermal ablation of the [facility] on 06/14/2016.  She currently denies fever, abdominal pain, nausea, vomiting. She denies symptoms of encephalopathy.  She has been taking her lactulose as directed but complaints it is raising her blood sugar.  She says she feels her appetite is pretty good. She thinks she may have lost a couple of pounds.   Past Medical History:  Diagnosis Date  . Breast cancer ([place]) 05/2006   Right breast cancer  . Cirrhosis ([place])   . Diabetes mellitus without complication ([place])    type 2  . Fibroids   . Hepatocellular carcinoma ([place]) 05/08/2016  . Hypothyroidism   . Osteopenia   . Thrombocytopenia ([place])   . Thyroid disease     Past Surgical History:  Procedure Laterality Date  . ABDOMINAL HYSTERECTOMY     TAH BSO  . BREAST LUMPECTOMY WITH AXILLARY LYMPH NODE BIOPSY Right 06/25/2006  . CHOLECYSTECTOMY    . IR GENERIC HISTORICAL  03/28/2016   IR RADIOLOGIST EVAL & MGMT 03/28/2016 Corrie Mckusick, DO [facility] INTERV RAD  . IR GENERIC HISTORICAL  05/01/2016   IR ANGIOGRAM VISCERAL SELECTIVE 05/01/2016 Corrie Mckusick, DO [facility]  . IR GENERIC  HISTORICAL  05/01/2016   IR ANGIOGRAM VISCERAL SELECTIVE 05/01/2016 Corrie Mckusick, DO [facility]  . IR GENERIC HISTORICAL  05/01/2016   IR ANGIOGRAM SELECTIVE EACH ADDITIONAL VESSEL 05/01/2016 Corrie Mckusick, DO [facility]  . IR GENERIC HISTORICAL  05/01/2016   IR ANGIOGRAM SELECTIVE EACH ADDITIONAL VESSEL 05/01/2016 Corrie Mckusick, DO [facility]  . IR GENERIC HISTORICAL  05/01/2016   IR ANGIOGRAM SELECTIVE EACH ADDITIONAL VESSEL 05/01/2016 Corrie Mckusick, DO [facility]  . IR GENERIC HISTORICAL  05/01/2016   IR EMBO TUMOR ORGAN ISCHEMIA INFARCT INC GUIDE ROADMAPPING 05/01/2016 Corrie Mckusick, DO [facility]  . IR GENERIC HISTORICAL  05/01/2016   IR CT SPINE LTD 05/01/2016 Corrie Mckusick, DO [facility]  . IR GENERIC HISTORICAL  05/01/2016   IR US GUIDE VASC ACCESS RIGHT 05/01/2016 Corrie Mckusick, DO [facility]  . IR GENERIC HISTORICAL  05/01/2016   IR ANGIOGRAM SELECTIVE EACH ADDITIONAL VESSEL 05/01/2016 Corrie Mckusick, DO [facility]  . RADIOFREQUENCY ABLATION N/A 06/14/2016   Procedure: LIVER MICROWAVE THERMAL  ABLATION;  Surgeon: Corrie Mckusick, DO;  Location: [facility];  Service: Anesthesiology;  Laterality: N/A;  . TUBAL LIGATION  1978    Allergies: Metformin and related and Welchol [colesevelam hcl]  Medications: Prior to Admission medications   Medication Sig Start Date End Date Taking? Authorizing Provider  Cholecalciferol (VITAMIN D) 2000 UNITS tablet Take 4,000 Units by mouth 2 (two) times daily.    [provider]  glipiZIDE (GLUCOTROL) 5 MG tablet TAKE ONE TABLET BY MOUTH THREE TIMES DAILY BEFORE MEAL(S). 06/28/16   Forcucci,  Loma Sousa, PA-C  HYDROcodone-acetaminophen (NORCO/VICODIN) 5-325 MG tablet Take 1-2 tablets by mouth every 4 (four) hours as needed for moderate pain. 06/15/16   Ascencion Dike, PA-C  lactulose (CHRONULAC) 10 GM/15ML solution Take 30 mLs (20 g total) by mouth 3 (three) times daily. 04/03/16   Forcucci, Courtney, PA-C  levothyroxine (SYNTHROID, LEVOTHROID) 150 MCG tablet TAKE ONE  TO ONE & ONE-HALF TABLETS BY MOUTH ONCE DAILY AS DIRECTED 03/23/16   Unk Pinto, MD  linagliptin (TRADJENTA) 5 MG TABS tablet Take 1 tablet (5 mg total) by mouth daily. 12/27/15   Vicie Mutters, PA-C  Omega-3 Fatty Acids (FISH OIL) 1200 MG CAPS Take 1 capsule by mouth daily.    [provider]  ONE TOUCH ULTRA TEST test strip  04/22/16   [provider]  Jonetta Speak LANCETS 43X MISC  04/22/16   [provider]  pantoprazole (PROTONIX) 40 MG tablet Take 1 tablet (40 mg total) by mouth daily. 05/22/16   Forcucci, Courtney, PA-C  triamcinolone cream (KENALOG) 0.5 % Apply 1 application topically 2 (two) times daily. 12/27/15   Vicie Mutters, PA-C     Family History  Problem Relation Age of Onset  . Cancer Mother     Brain cancer  . Cancer Father     Prostate cancer  . Diabetes Sister   . Hypertension Sister   . Liver disease Brother   . Diabetes Maternal Aunt   . Heart Problems Maternal Grandfather   . Cancer Paternal Grandfather     Unknown cancer  . Diabetes Sister     Social History   Social History  . Marital status: Married    Spouse name: N/A  . Number of children: N/A  . Years of education: N/A   Social History Main Topics  . Smoking status: Never Smoker  . Smokeless tobacco: Never Used  . Alcohol use No  . Drug use: No  . Sexual activity: Yes    Birth control/ protection: Post-menopausal, Surgical     Comment: Hysterectomy   Other Topics Concern  . Not on file   Social History Narrative  . No narrative on file    Review of Systems: A 12 point ROS discussed and pertinent positives are indicated in the HPI above.  All other systems are negative.  Review of Systems  Vital Signs: BP (!) 108/53   Pulse 71   Temp 98.1 F (36.7 C) (Oral)   Resp 14   Ht 5' 2"  (1.575 m)   Wt 140 lb (63.5 kg)   SpO2 98%   BMI 25.61 kg/m   Physical Exam  Constitutional: She is oriented to person, place, and time. She appears well-developed  and well-nourished.  HENT:  Head: Normocephalic and atraumatic.  Eyes: EOM are normal.  Neck: Normal range of motion.  Cardiovascular: Normal rate, regular rhythm and normal heart sounds.   Pulmonary/Chest: Effort normal and breath sounds normal. No respiratory distress. She has no wheezes.  Abdominal: Soft. She exhibits no distension. There is no tenderness.  Stick site completely  healed  Musculoskeletal: Normal range of motion.  Neurological: She is alert and oriented to person, place, and time.  Skin: Skin is warm and dry.  Psychiatric: She has a normal mood and affect. Her behavior is normal. Judgment and thought content normal.  Vitals reviewed.    Imaging: No results found.  Labs:  CBC:  Recent Labs  05/01/16 0750 05/08/16 1342 06/12/16 1126 06/15/16 0344  WBC 2.9* 2.1* 2.4* 4.7  HGB  11.9* 11.8 11.7* 10.9*  HCT 34.0* 34.2* 33.2* 31.5*  PLT 95* 104* 87* 80*    COAGS:  Recent Labs  03/18/16 2114 05/01/16 0750 06/12/16 1126  INR 1.26 1.22 1.19  APTT 31 32  --     BMP:  Recent Labs  05/01/16 0750 05/08/16 1342 06/12/16 1126 06/15/16 0344  NA 137 131* 140 140  K 4.1 4.2 4.2 3.8  CL 108 102 111 110  CO2 21* 27 25 24   GLUCOSE 222* 274* 129* 116*  BUN 9 10 13 14   CALCIUM 9.0 9.4 8.9 8.4*  CREATININE 0.63 0.70 0.70 0.72  GFRNONAA >60 86 >60 >60  GFRAA >60 99 >60 >60    LIVER FUNCTION TESTS:  Recent Labs  05/01/16 0750 05/08/16 1342 06/12/16 1126 06/15/16 0344  BILITOT 1.4* 0.7 1.0 1.8*  AST 59* 93* 52* 200*  ALT 48 65* 36 82*  ALKPHOS 96 141* 88 94  PROT 6.6 6.6 6.4* 6.1*  ALBUMIN 3.4* 3.3* 3.3* 2.9*    TUMOR MARKERS:  Recent Labs  03/21/16 0510 03/22/16 0821 03/23/16 0622  AFPTM  --  415.9* 352.9*  CEA 1.5  --   --     Assessment:  4 cm segment 8 hepatocellular carcinoma  S/P Microwave ablation by Dr. Earleen Newport on 06/14/2016  Electronically Signed: Murrell Redden 07/31/2016, 2:30 PM   Please refer to Dr. Pasty Arch  attestation of this note for management and plan.

## 2016-08-09 ENCOUNTER — Telehealth: Payer: Self-pay | Admitting: Hematology & Oncology

## 2016-08-09 NOTE — Telephone Encounter (Signed)
Patient called requesting to cancel 08/21/2016 appt. Patient stated she wants to wait until she has a CT Scan that has been ordered by another Doctor. This is patient's 2nd time canceling this appt. I advised the patient to call us once she has a date for the CT Scan.       [facility] MAY 2018  [facility].

## 2016-08-12 ENCOUNTER — Telehealth: Payer: Self-pay

## 2016-08-12 NOTE — Telephone Encounter (Signed)
LVM for pt to pick up TRADENTA 35m samples up front.

## 2016-08-12 NOTE — Telephone Encounter (Signed)
-----   Message from Oliver Pila sent at 08/12/2016 10:00 AM EDT ----- Regarding: sample request Tradjenta 9m Contact: [phone] request: Tradjenta 542mPlease call if samples are available, or pharm: [facility]  FYI: Lactulose run her sugar high, recommended she also contact prescribing provider.  kaAdonis Huguenin

## 2016-08-21 ENCOUNTER — Other Ambulatory Visit: Payer: PPO

## 2016-08-21 ENCOUNTER — Ambulatory Visit: Payer: PPO | Admitting: Hematology & Oncology

## 2016-08-24 NOTE — Addendum Note (Signed)
Addendum  created 08/24/16 1054 by Duane Boston, MD   Sign clinical note

## 2016-09-06 ENCOUNTER — Encounter: Payer: Self-pay | Admitting: Interventional Radiology

## 2016-10-02 ENCOUNTER — Other Ambulatory Visit: Payer: Self-pay | Admitting: Internal Medicine

## 2016-10-03 ENCOUNTER — Other Ambulatory Visit: Payer: Self-pay

## 2016-10-03 ENCOUNTER — Telehealth: Payer: Self-pay

## 2016-10-03 NOTE — Telephone Encounter (Signed)
Pt called to get samples of trulicty 3.97-6.7 1 box of trulicty that contain 2 pen needles were put up for the pt to pick.  Pt was informed of the samples to pick & pt states she will be here before 5 to pick up samples.

## 2016-10-03 NOTE — Telephone Encounter (Signed)
Entered in error

## 2016-10-08 ENCOUNTER — Other Ambulatory Visit: Payer: Self-pay | Admitting: Interventional Radiology

## 2016-10-08 DIAGNOSIS — C220 Liver cell carcinoma: Secondary | ICD-10-CM

## 2016-10-10 ENCOUNTER — Telehealth: Payer: Self-pay | Admitting: Radiology

## 2016-10-10 NOTE — Telephone Encounter (Signed)
Patient states that she does not want to schedule follow up at this time due to financial concerns.  She prefers to wait until the end of the year to schedule follow CT and follow up appointment with Dr. Corrie Mckusick.    Jerilyn Gillaspie Riki Rusk, RN 10/10/2016 8:59 AM

## 2016-10-14 ENCOUNTER — Other Ambulatory Visit: Payer: Self-pay | Admitting: Internal Medicine

## 2016-10-17 ENCOUNTER — Other Ambulatory Visit: Payer: Self-pay | Admitting: Internal Medicine

## 2016-11-27 ENCOUNTER — Ambulatory Visit: Payer: Self-pay | Admitting: Internal Medicine

## 2016-12-03 NOTE — Progress Notes (Signed)
FOLLOW UP  Assessment and Plan:    Hypertension   Monitor blood pressure at home.  Continue DASH diet.    Reminder to go to the ER if any CP, SOB, nausea, dizziness, severe HA, changes vision/speech, left arm numbness and tingling and jaw pain.  Cholesterol  Continue diet and exercise.   Check cholesterol.    Diabetes with diabetic chronic kidney disease  Continue medications; glipizide 5 mg TID before meals, tradjenta 5 mg daily--currently also taking trulicity 4.65/0.3TW injection weekly, concerned about potential hypoglycemia; pending A1C will adjust medications  Continue diet and exercise.   Perform routine foot/skin checks, notify office of any concerning changes.   Check A1C  Vitamin D Def  Continue medications.   Check Vit D level  Hypothyroidism  Check TSH level  Continue medications the same   Continue diet and meds as discussed. Further disposition pending results of labs. Discussed med's effects and SE's.   Over 30 minutes of exam, counseling, chart review, and critical decision making was performed.   Future Appointments Date Time Provider [place]  01/21/2017 2:00 PM Vicie Mutters, PA-C [facility] None    ----------------------------------------------------------------------------------------------------------------------  HPI 76 y.o. female  presents for 3 month follow up on hypertension, cholesterol, diabetes with CKD and vitamin D deficiency.   She reports she has AM fasting blood sugars generally in the 90-120 range, though reports one episode where it was 70- denies any accompanying symptoms such as shakiness, diaphoresis, irritability. She is currently taking trulicity 6.56/8.1EX weekly injections for which she was given a sample at a previous visit.   Today their BP is BP: 110/60  She does workout. She denies chest pain, shortness of breath, dizziness.   She is not on cholesterol medication and denies myalgias. Her  cholesterol is at goal. The cholesterol last visit was:   Lab Results  Component Value Date   CHOL 185 12/27/2015   HDL 62 12/27/2015   LDLCALC 86 12/27/2015   TRIG 184 (H) 12/27/2015   CHOLHDL 3.0 12/27/2015    She has been working on diet and exercise for prediabetes, and denies hyperglycemia, nausea, paresthesia of the feet, polydipsia, polyuria and vomiting. Last A1C in the office was:  Lab Results  Component Value Date   HGBA1C 6.4 (H) 12/27/2015   Patient is on Vitamin D supplement and last lab was below goal:   Lab Results  Component Value Date   VD25OH 36 07/26/2015         Current Medications:  Current Outpatient Prescriptions on File Prior to Visit  Medication Sig  . Cholecalciferol (VITAMIN D) 2000 UNITS tablet Take 4,000 Units by mouth 2 (two) times daily.  Marland Kitchen glipiZIDE (GLUCOTROL) 5 MG tablet TAKE ONE TABLET BY MOUTH THREE TIMES DAILY BEFORE MEAL(S).  Marland Kitchen lactulose (CHRONULAC) 10 GM/15ML solution Take 30 mLs (20 g total) by mouth 3 (three) times daily.  Marland Kitchen levothyroxine (SYNTHROID, LEVOTHROID) 150 MCG tablet TAKE ONE TO ONE & ONE-HALF TABLETS BY MOUTH ONCE DAILY AS DIRECTED.  Marland Kitchen linagliptin (TRADJENTA) 5 MG TABS tablet Take 1 tablet (5 mg total) by mouth daily.  . ONE TOUCH ULTRA TEST test strip   . ONE TOUCH ULTRA TEST test strip CHECK BLOOD SUGAR 1 TIME DAILY  . ONETOUCH DELICA LANCETS 51Z MISC   . ONETOUCH DELICA LANCETS 00F MISC USE TO CHECK BLOOD SUGAR 1 TIME DAILY  . pantoprazole (PROTONIX) 40 MG tablet Take 1 tablet (40 mg total) by mouth daily.  Marland Kitchen triamcinolone cream (KENALOG) 0.5 %  Apply 1 application topically 2 (two) times daily.   No current facility-administered medications on file prior to visit.      Allergies:  Allergies  Allergen Reactions  . Metformin And Related Other (See Comments)  . Welchol [Colesevelam Hcl] Other (See Comments)     Medical History:  Past Medical History:  Diagnosis Date  . Breast cancer ([place]) 05/2006   Right breast  cancer  . Cirrhosis ([place])   . Diabetes mellitus without complication ([place])    type 2  . Fibroids   . Hepatocellular carcinoma ([place]) 05/08/2016  . Hypothyroidism   . Osteopenia   . Thrombocytopenia ([place])   . Thyroid disease    Family history- Reviewed and unchanged Social history- Reviewed and unchanged   Review of Systems:  Review of Systems  Constitutional: Negative for chills, fever and malaise/fatigue.  HENT: Negative for congestion, ear pain, hearing loss, nosebleeds, sore throat and tinnitus.   Eyes: Negative for blurred vision and double vision.  Respiratory: Negative for cough, shortness of breath and wheezing.   Cardiovascular: Negative for chest pain, palpitations and leg swelling.  Gastrointestinal: Negative for abdominal pain, blood in stool, constipation, diarrhea, heartburn, melena, nausea and vomiting.  Genitourinary: Negative.   Musculoskeletal: Negative.   Skin: Positive for rash (Chest/neck area x6 months). Negative for itching.  Neurological: Negative for dizziness, sensory change, weakness and headaches.  Endo/Heme/Allergies: Negative.   Psychiatric/Behavioral: Negative.       Physical Exam: BP 110/60   Pulse 70   Temp 97.7 F (36.5 C)   Resp 14   Ht 5' 2"  (1.575 m)   Wt 141 lb 12.8 oz (64.3 kg)   SpO2 97%   BMI 25.94 kg/m  Wt Readings from Last 3 Encounters:  12/04/16 141 lb 12.8 oz (64.3 kg)  07/31/16 140 lb (63.5 kg)  06/14/16 140 lb (63.5 kg)   General Appearance: Well nourished, in no apparent distress. Eyes: PERRLA, EOMs, conjunctiva no swelling or erythema Sinuses: No Frontal/maxillary tenderness ENT/Mouth: Ext aud canals clear, TMs without erythema, bulging. No erythema, swelling, or exudate on post pharynx.  Tonsils not swollen or erythematous. Hearing normal.  Neck: Supple, thyroid normal.  Respiratory: Respiratory effort normal, BS equal bilaterally without rales, rhonchi, wheezing or stridor.  Cardio: RRR with no MRGs. Brisk  peripheral pulses without edema.  Abdomen: Soft, + BS.  Non tender, no guarding, rebound, hernias, masses. Lymphatics: Non tender without lymphadenopathy.  Musculoskeletal: Full ROM, 5/5 strength, Normal gait Skin: Warm, without lesions or ecchymosis. Sandpaper-like texture miliar rash to neck and chest area.   Neuro: Cranial nerves intact. No cerebellar symptoms.  Psych: Awake and oriented X 3, normal affect, Insight and Judgment appropriate.    Izora Ribas, NP 2:45 PM [facility]

## 2016-12-04 ENCOUNTER — Ambulatory Visit (INDEPENDENT_AMBULATORY_CARE_PROVIDER_SITE_OTHER): Payer: PPO | Admitting: Adult Health

## 2016-12-04 ENCOUNTER — Encounter: Payer: Self-pay | Admitting: Adult Health

## 2016-12-04 VITALS — BP 110/60 | HR 70 | Temp 97.7°F | Resp 14 | Ht 62.0 in | Wt 141.8 lb

## 2016-12-04 DIAGNOSIS — N182 Chronic kidney disease, stage 2 (mild): Secondary | ICD-10-CM

## 2016-12-04 DIAGNOSIS — Z79899 Other long term (current) drug therapy: Secondary | ICD-10-CM | POA: Diagnosis not present

## 2016-12-04 DIAGNOSIS — E785 Hyperlipidemia, unspecified: Secondary | ICD-10-CM

## 2016-12-04 DIAGNOSIS — E039 Hypothyroidism, unspecified: Secondary | ICD-10-CM

## 2016-12-04 DIAGNOSIS — E559 Vitamin D deficiency, unspecified: Secondary | ICD-10-CM

## 2016-12-04 DIAGNOSIS — R21 Rash and other nonspecific skin eruption: Secondary | ICD-10-CM

## 2016-12-04 DIAGNOSIS — E1122 Type 2 diabetes mellitus with diabetic chronic kidney disease: Secondary | ICD-10-CM | POA: Diagnosis not present

## 2016-12-04 DIAGNOSIS — I10 Essential (primary) hypertension: Secondary | ICD-10-CM

## 2016-12-04 MED ORDER — PREDNISONE 20 MG PO TABS: ORAL_TABLET | ORAL | 0 refills | Status: DC

## 2016-12-04 MED ORDER — DULAGLUTIDE 0.75 MG/0.5ML ~~LOC~~ SOAJ: 0.5000 mL | SUBCUTANEOUS | 0 refills | Status: DC

## 2016-12-04 NOTE — Patient Instructions (Addendum)
Take the prednisone as directed with food or milk for your rash-- 3 tablets for 3 days, 2 tablets for 3 days, and 1 tablet for 5 days.   HOME CARE INSTRUCTIONS   Do not stand or sit in one position for long periods of time. Do not sit with your legs crossed. Rest with your legs raised during the day.  Your legs have to be higher than your heart so that gravity will force the valves to open, so please really elevate your legs.   Wear elastic stockings or support hose. Do not wear other tight, encircling garments around the legs, pelvis, or waist.  [employer]  has a wide variety of well priced compression stockings. [place], [place] [address] [phone]  Walk as much as possible to increase blood flow.  Raise the foot of your bed at night with 2-inch blocks. SEEK MEDICAL CARE IF:   The skin around your ankle starts to break down.  You have pain, redness, tenderness, or hard swelling developing in your leg over a vein.  You are uncomfortable due to leg pain. Document Released: 12/19/2004 Document Revised: 06/03/2011 Document Reviewed: 05/07/2010 [facility] Patient Information 2014 [place]. Here is some information to help you keep your heart healthy: Move it! - Aim for 30 mins of activity every day. Take it slowly at first. Talk to [place] before starting any new exercise program.   Lose it.  -Body Mass Index (BMI) can indicate if you need to lose weight. A healthy range is 18.5-24.9. For a BMI calculator, go to [employer].com  Waist Management -Excess abdominal fat is a risk factor for heart disease, diabetes, asthma, stroke and more. Ideal waist circumference is less than 35" for women and less than 40" for men.   Eat Right -focus on fruits, vegetables, whole grains, and meals you make yourself. Avoid foods with trans fat and high sugar/sodium content.   Snooze or Snore? - Loud snoring can be a sign of sleep apnea, a significant risk factor for high blood  pressure, heart attach, stroke, and heart arrhythmias.  Kick the habit -Quit Smoking! Avoid second hand smoke. A single cigarette raises your blood pressure for 20 mins and increases the risk of heart attack and stroke for the next 24 hours.   Are Aspirin and Supplements right for you? -Add ENTERIC COATED low dose 81 mg Aspirin daily OR can do every other day if you have easy bruising to protect your heart and head. As well as to reduce risk of Colon Cancer by 20 %, Skin Cancer by 26 % , Melanoma by 46% and Pancreatic cancer by 60%  Say "No to Stress -There may be little you can do about problems that cause stress. However, techniques such as long walks, meditation, and exercise can help you manage it.   Start Now! - Make changes one at a time and set reasonable goals to increase your likelihood of success.

## 2016-12-05 LAB — BASIC METABOLIC PANEL WITH GFR
BUN: 12 mg/dL (ref 7–25)
CALCIUM: 8.9 mg/dL (ref 8.6–10.4)
CO2: 24 mmol/L (ref 20–32)
CREATININE: 0.72 mg/dL (ref 0.60–0.93)
Chloride: 107 mmol/L (ref 98–110)
GFR, EST NON AFRICAN AMERICAN: 82 mL/min/{1.73_m2} (ref >=60)
GFR, Est African American: 95 mL/min/{1.73_m2} (ref >=60)
Glucose, Bld: 76 mg/dL (ref 65–99)
POTASSIUM: 3.7 mmol/L (ref 3.5–5.3)
Sodium: 140 mmol/L (ref 135–146)

## 2016-12-05 LAB — HEPATIC FUNCTION PANEL
AG Ratio: 1.3 (calc) (ref 1.0–2.5)
ALBUMIN MSPROF: 3.6 g/dL (ref 3.6–5.1)
ALT: 36 U/L — AB (ref 6–29)
AST: 54 U/L — AB (ref 10–35)
Alkaline phosphatase (APISO): 82 U/L (ref 33–130)
BILIRUBIN TOTAL: 1.5 mg/dL — AB (ref 0.2–1.2)
Bilirubin, Direct: 0.3 mg/dL — ABNORMAL HIGH (ref 0.0–0.2)
Globulin: 2.8 g/dL (calc) (ref 1.9–3.7)
Indirect Bilirubin: 1.2 mg/dL (calc) (ref 0.2–1.2)
Total Protein: 6.4 g/dL (ref 6.1–8.1)

## 2016-12-05 LAB — CBC WITH DIFFERENTIAL/PLATELET
BASOS PCT: 0.7 %
Basophils Absolute: 20 cells/uL (ref 0–200)
EOS ABS: 59 {cells}/uL (ref 15–500)
Eosinophils Relative: 2.1 %
HEMATOCRIT: 35.2 % (ref 35.0–45.0)
Hemoglobin: 12 g/dL (ref 11.7–15.5)
Lymphs Abs: 605 cells/uL — ABNORMAL LOW (ref 850–3900)
MCH: 34.7 pg — ABNORMAL HIGH (ref 27.0–33.0)
MCHC: 34.1 g/dL (ref 32.0–36.0)
MCV: 101.7 fL — AB (ref 80.0–100.0)
MONOS PCT: 10.6 %
MPV: 11.7 fL (ref 7.5–12.5)
NEUTROS PCT: 65 %
Neutro Abs: 1820 cells/uL (ref 1500–7800)
Platelets: 92 10*3/uL — ABNORMAL LOW (ref 140–400)
RBC: 3.46 10*6/uL — ABNORMAL LOW (ref 3.80–5.10)
RDW: 13.3 % (ref 11.0–15.0)
TOTAL LYMPHOCYTE: 21.6 %
WBC: 2.8 10*3/uL — ABNORMAL LOW (ref 3.8–10.8)
WBCMIX: 297 {cells}/uL (ref 200–950)

## 2016-12-05 LAB — MAGNESIUM: Magnesium: 1.7 mg/dL (ref 1.5–2.5)

## 2016-12-05 LAB — HEMOGLOBIN A1C
Hgb A1c MFr Bld: 6.2 % of total Hgb — ABNORMAL HIGH (ref <5.7)
MEAN PLASMA GLUCOSE: 131 (calc)
eAG (mmol/L): 7.3 (calc)

## 2016-12-05 LAB — TSH: TSH: 0.01 mIU/L — ABNORMAL LOW (ref 0.40–4.50)

## 2016-12-05 LAB — LIPID PANEL
CHOLESTEROL: 194 mg/dL (ref <200)
HDL: 74 mg/dL (ref >50)
LDL Cholesterol (Calc): 103 mg/dL (calc) — ABNORMAL HIGH
NON-HDL CHOLESTEROL (CALC): 120 mg/dL (ref <130)
TRIGLYCERIDES: 82 mg/dL (ref <150)
Total CHOL/HDL Ratio: 2.6 (calc) (ref <5.0)

## 2016-12-05 LAB — VITAMIN D 25 HYDROXY (VIT D DEFICIENCY, FRACTURES): Vit D, 25-Hydroxy: 81 ng/mL (ref 30–100)

## 2016-12-06 ENCOUNTER — Telehealth: Payer: Self-pay

## 2016-12-06 NOTE — Telephone Encounter (Signed)
Pt informed

## 2016-12-06 NOTE — Progress Notes (Signed)
Pt aware of lab results & voiced understanding of those results.

## 2016-12-06 NOTE — Telephone Encounter (Signed)
-----   Message from Liane Comber, NP sent at 12/06/2016 11:51 AM EDT ----- Regarding: RE: med change The patient can take 1/2 tab on Monday, Wednesday, Friday, and take a whole tab on Sunday, Tuesday, Thursday, and Saturday. Please schedule her for follow up just for lab draw/nurse visit in 4 weeks.   Thanks!   ----- Message ----- From: Elenor Quinones, CMA Sent: 12/06/2016  11:41 AM To: Liane Comber, NP Subject: med change                                     Pt reports that she is not taking 1 and 1/2 tablets of 150 mcg daily, & you wanted the pt to take 1 tab a day but this is what she is taking already. Please advise.

## 2016-12-07 ENCOUNTER — Other Ambulatory Visit: Payer: Self-pay | Admitting: Internal Medicine

## 2016-12-07 DIAGNOSIS — M810 Age-related osteoporosis without current pathological fracture: Secondary | ICD-10-CM

## 2016-12-10 ENCOUNTER — Telehealth: Payer: Self-pay

## 2016-12-10 ENCOUNTER — Other Ambulatory Visit: Payer: Self-pay | Admitting: Interventional Radiology

## 2016-12-10 DIAGNOSIS — C220 Liver cell carcinoma: Secondary | ICD-10-CM

## 2016-12-10 NOTE — Telephone Encounter (Signed)
Pt states she will follow with oncologist. She states she knows that she should go but she just wanted to get her bills down before going. I emphasized the importance of following up with Dr. Marin Olp & pt agreed & voiced understanding & hung up.

## 2016-12-10 NOTE — Telephone Encounter (Signed)
-----   Message from Liane Comber, NP sent at 12/09/2016  4:33 PM EDT ----- Regarding: Follow up with Dr. Corinne Ports-  I reached out to Dr. Marin Olp (oncologist) regarding Ms. Serita Kyle labs (CBC, liver function) that were significantly out of range- he states she has cancelled several visits in the past few months and states she needs to reach out to make an appointment that she can keep. Please call her and emphasize that it is very important for her to follow up with her oncologists, and to encourage her to please make a follow up appointment.   Thank you!  Caryl Pina

## 2016-12-16 ENCOUNTER — Other Ambulatory Visit: Payer: Self-pay | Admitting: Adult Health

## 2016-12-16 DIAGNOSIS — E039 Hypothyroidism, unspecified: Secondary | ICD-10-CM

## 2016-12-25 ENCOUNTER — Ambulatory Visit (HOSPITAL_COMMUNITY)
Admission: RE | Admit: 2016-12-25 | Discharge: 2016-12-25 | Disposition: A | Discharge status: Home / Self Care | Payer: PPO | Source: Ambulatory Visit | Attending: Interventional Radiology | Admitting: Interventional Radiology

## 2016-12-25 ENCOUNTER — Ambulatory Visit
Admission: RE | Admit: 2016-12-25 | Discharge: 2016-12-25 | Disposition: A | Discharge status: Home / Self Care | Payer: PPO | Source: Ambulatory Visit | Attending: Interventional Radiology | Admitting: Interventional Radiology

## 2016-12-25 DIAGNOSIS — I864 Gastric varices: Secondary | ICD-10-CM | POA: Diagnosis not present

## 2016-12-25 DIAGNOSIS — Z9889 Other specified postprocedural states: Secondary | ICD-10-CM | POA: Diagnosis not present

## 2016-12-25 DIAGNOSIS — Z8505 Personal history of malignant neoplasm of liver: Secondary | ICD-10-CM | POA: Diagnosis not present

## 2016-12-25 DIAGNOSIS — K766 Portal hypertension: Secondary | ICD-10-CM | POA: Diagnosis not present

## 2016-12-25 DIAGNOSIS — C220 Liver cell carcinoma: Secondary | ICD-10-CM

## 2016-12-25 DIAGNOSIS — K5730 Diverticulosis of large intestine without perforation or abscess without bleeding: Secondary | ICD-10-CM | POA: Diagnosis not present

## 2016-12-25 DIAGNOSIS — K7460 Unspecified cirrhosis of liver: Secondary | ICD-10-CM | POA: Diagnosis not present

## 2016-12-25 DIAGNOSIS — I700 Atherosclerosis of aorta: Secondary | ICD-10-CM | POA: Diagnosis not present

## 2016-12-25 HISTORY — PX: IR RADIOLOGIST EVAL & MGMT: IMG5224

## 2016-12-25 MED ORDER — IOPAMIDOL (ISOVUE-300) INJECTION 61%
INTRAVENOUS | Status: AC
  Filled 2016-12-25: qty 100

## 2016-12-25 MED ORDER — IOPAMIDOL (ISOVUE-300) INJECTION 61%
100.0000 mL | Freq: Once | INTRAVENOUS | Status: AC | PRN
  Administered 2016-12-25: 100 mL via INTRAVENOUS

## 2016-12-25 NOTE — Progress Notes (Signed)
Chief Complaint: I have a liver tumor.   Referring Physician(s): Dr Marin Olp   History of Present Illness: Beverly Davis is a 76 y.o. female presenting to [facility]ascular [facility] Inter[facility] Radiology clinic today for scheduled follow up, status-post treatment of right liver HCC.   We met Beverly Davis first 03/28/2016, and treated a 4cm tumor of segment 8 with combination therapy -- arterial embolization and then microwave ablation.  The first treatment was 05/01/2016 as outpatient, and then the microwave ablation 06/14/2016 with overnight observation.    She recovered well from both treatments, and returns today with her first imaging follow up completed.   She denies any symptoms, with no N/V/D, no abdominal or chest pain, no pleurisy, no residual pain at the site of probe insertion, no GI symptoms, no fevers/rigors/chills.    She has not had a recent appointment with Dr. Marin Olp, and she continues to see her PCP.    On today's CT study, there are treatment changes of her ablation, with no concern for residual disease.  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     Past Medical History:  Diagnosis Date  . Breast cancer ([place]) 05/2006   Right breast cancer  . Cirrhosis ([place])   . Diabetes mellitus without complication ([place])    type 2  . Fibroids   . Hepatocellular carcinoma ([place]) 05/08/2016  . Hypothyroidism   . Osteopenia   . Thrombocytopenia ([place])   . Thyroid disease     Past Surgical History:  Procedure Laterality Date  . ABDOMINAL HYSTERECTOMY     TAH BSO  . BREAST LUMPECTOMY WITH AXILLARY LYMPH NODE BIOPSY Right 06/25/2006  . CHOLECYSTECTOMY    . IR GENERIC HISTORICAL  03/28/2016   IR RADIOLOGIST EVAL & MGMT 03/28/2016 Corrie Mckusick, DO [facility]  . IR GENERIC HISTORICAL  05/01/2016   IR ANGIOGRAM VISCERAL SELECTIVE 05/01/2016 Corrie Mckusick, DO [facility]  . IR GENERIC HISTORICAL  05/01/2016   IR ANGIOGRAM VISCERAL SELECTIVE 05/01/2016 Corrie Mckusick, DO [facility]    . IR GENERIC HISTORICAL  05/01/2016   IR ANGIOGRAM SELECTIVE EACH ADDITIONAL VESSEL 05/01/2016 Corrie Mckusick, DO [facility]  . IR GENERIC HISTORICAL  05/01/2016   IR ANGIOGRAM SELECTIVE EACH ADDITIONAL VESSEL 05/01/2016 Corrie Mckusick, DO [facility]  . IR GENERIC HISTORICAL  05/01/2016   IR ANGIOGRAM SELECTIVE EACH ADDITIONAL VESSEL 05/01/2016 Corrie Mckusick, DO [facility]  . IR GENERIC HISTORICAL  05/01/2016   IR EMBO TUMOR ORGAN ISCHEMIA INFARCT INC GUIDE ROADMAPPING 05/01/2016 Corrie Mckusick, DO [facility]  . IR GENERIC HISTORICAL  05/01/2016   IR CT SPINE LTD 05/01/2016 Corrie Mckusick, DO [facility]  . IR GENERIC HISTORICAL  05/01/2016   IR US GUIDE VASC ACCESS RIGHT 05/01/2016 Corrie Mckusick, DO [facility]  . IR GENERIC HISTORICAL  05/01/2016   IR ANGIOGRAM SELECTIVE EACH ADDITIONAL VESSEL 05/01/2016 Corrie Mckusick, DO [facility]  . IR RADIOLOGIST EVAL & MGMT  07/31/2016  . RADIOFREQUENCY ABLATION N/A 06/14/2016   Procedure: LIVER MICROWAVE THERMAL  ABLATION;  Surgeon: Corrie Mckusick, DO;  Location: [facility];  Service: Anesthesiology;  Laterality: N/A;  . TUBAL LIGATION  1978    Allergies: Metformin and related and Welchol [colesevelam hcl]  Medications: Prior to Admission medications   Medication Sig Start Date End Date Taking? Authorizing Provider  alendronate (FOSAMAX) 70 MG tablet TAKE 1 TABLET BY MOUTH ONCE A WEEK 12/07/16  Yes Vicie Mutters, PA-C  Cholecalciferol (VITAMIN D) 2000 UNITS tablet Take 4,000 Units  by mouth 2 (two) times daily.   Yes [provider]  Dulaglutide (TRULICITY) 0.09 FG/1.8EX SOPN Inject 0.5 mLs into the skin once a week. 12/04/16  Yes Liane Comber, NP  glipiZIDE (GLUCOTROL) 5 MG tablet TAKE ONE TABLET BY MOUTH THREE TIMES DAILY BEFORE MEAL(S). 10/15/16  Yes Unk Pinto, MD  lactulose (CHRONULAC) 10 GM/15ML solution Take 30 mLs (20 g total) by mouth 3 (three) times daily. 04/03/16  Yes Forcucci, Courtney, PA-C  levothyroxine (SYNTHROID, LEVOTHROID) 150 MCG  tablet TAKE ONE TO ONE & ONE-HALF TABLETS BY MOUTH ONCE DAILY AS DIRECTED. 10/02/16  Yes Unk Pinto, MD  linagliptin (TRADJENTA) 5 MG TABS tablet Take 1 tablet (5 mg total) by mouth daily. 12/27/15  Yes Vicie Mutters, PA-C  ONE TOUCH ULTRA TEST test strip  04/22/16  Yes [provider]  Jonetta Speak LANCETS [id number] [employer]  04/22/16  Yes [provider]  ONE TOUCH ULTRA TEST test strip CHECK BLOOD SUGAR 1 TIME DAILY 10/17/16   Vicie Mutters, PA-C  ONETOUCH DELICA LANCETS 16R MISC USE TO CHECK BLOOD SUGAR 1 TIME DAILY 10/17/16   Vicie Mutters, PA-C  predniSONE (DELTASONE) 20 MG tablet 3 tablets daily for 3 days, 2 tablets for 3 days, 1 tablet daily for 5 days. Patient not taking: Reported on 12/25/2016 12/04/16   Liane Comber, NP  triamcinolone cream (KENALOG) 0.5 % Apply 1 application topically 2 (two) times daily. 12/27/15   Vicie Mutters, PA-C     Family History  Problem Relation Age of Onset  . Cancer Mother        Brain cancer  . Cancer Father        Prostate cancer  . Diabetes Sister   . Hypertension Sister   . Liver disease Brother   . Diabetes Maternal Aunt   . Heart Problems Maternal Grandfather   . Cancer Paternal Grandfather        Unknown cancer  . Diabetes Sister     Social History   Social History  . Marital status: Married    Spouse name: N/A  . Number of children: N/A  . Years of education: N/A   Social History Main Topics  . Smoking status: Never Smoker  . Smokeless tobacco: Never Used  . Alcohol use No  . Drug use: No  . Sexual activity: Yes    Birth control/ protection: Post-menopausal, Surgical     Comment: Hysterectomy   Other Topics Concern  . Not on file   Social History Narrative  . No narrative on file    ECOG Status: 0 - Asymptomatic  Review of Systems: A 12 point ROS discussed and pertinent positives are indicated in the HPI above.  All other systems are negative.  Review of Systems  Vital Signs: BP (!) 107/52    Pulse 74   Temp 97.7 F (36.5 C) (Oral)   Resp 14   Ht 5' 2"  (1.575 m)   Wt 141 lb (64 kg)   SpO2 99%   BMI 25.79 kg/m   Physical Exam  Mallampati Score:     Imaging: Ct Abdomen Pelvis W Wo Contrast  Result Date: 12/25/2016 CLINICAL DATA:  76 year old female status post thermal ablation for hepatocellular carcinoma on 06/14/2016. Additional history of right-sided breast cancer status post radiation therapy completed in 2008. Followup study. EXAM: CT ABDOMEN AND PELVIS WITHOUT AND WITH CONTRAST TECHNIQUE: Multidetector CT imaging of the abdomen and pelvis was performed following the standard protocol before and following the bolus administration of intravenous  contrast. CONTRAST:  126m ISOVUE-300 IOPAMIDOL (ISOVUE-300) INJECTION 61% COMPARISON:  CT of the abdomen and pelvis 04/27/2016. 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 Otherwise, unremarkable. Hepatobiliary: In the right lobe of the liver at the site of the previously identified lesion there are again postprocedural changes from prior embolization procedure with high attenuation material throughout this region, as well as a new well-defined low-attenuation area which is related to the prior thermal ablation. Arterial phase images demonstrate no unexpected areas of hyperenhancement within this region to suggest locally recurrent disease. Shrunken appearance and nodular contour of the liver is again noted, indicative of underlying cirrhosis. No new suspicious hepatic lesions are noted. No intra or extrahepatic biliary ductal dilatation. Status post cholecystectomy. Pancreas: No pancreatic mass. No pancreatic ductal dilatation. No pancreatic or peripancreatic fluid or inflammatory changes. Spleen: Unremarkable. Adrenals/Urinary Tract: Subcentimeter low-attenuation lesion in the medial aspect of the lower pole of the left kidney is too small to characterize, but is statistically likely a tiny cyst. Right  kidney and bilateral adrenal glands are normal in appearance. There is no hydroureteronephrosis. Urinary bladder is normal in appearance. Stomach/Bowel: The appearance of the stomach is normal. No pathologic dilatation of small bowel or colon. Numerous colonic diverticulae are noted, particularly in the sigmoid colon, without surrounding inflammatory changes to suggest an acute diverticulitis at this time. Normal appendix. Vascular/Lymphatic: Aortic atherosclerosis, without evidence of aneurysm or dissection in the abdominal or pelvic vasculature. There are nonocclusive filling defects in the splenic vein (axial image 37 of series 11 and coronal image 35 of series 12) and in the distal superior mesenteric vein extending beyond the splenoportal confluence into the proximal portal vein (axial images 36-43 of series 11 and coronal image 36 of series 12), compatible with thrombus. Numerous portosystemic collateral pathways are noted, the largest of which are left-sided splenorenal collaterals, with the largest of these a 3.4 cm varix (axial image 35 of series 11), similar to prior studies. Multiple prominent proximal gastric and distal paraesophageal varices are also noted. No lymphadenopathy identified in the abdomen or pelvis. Reproductive: Status posthysterectomy. Ovaries are not confidently identified may be surgically absent or atrophic. Other: No significant volume of ascites.  No pneumoperitoneum. Musculoskeletal: There are no aggressive appearing lytic or blastic lesions noted in the visualized portions of the skeleton. IMPRESSION: 1. Postprocedural changes of prior thermal ablation and embolization in segment 8 of the liver for treatment of hepatocellular carcinoma. No evidence to suggest residual/recurrent disease on today's examination. No new lesions are noted. 2. Stigmata of cirrhosis with with portal hypertension redemonstrated, with multiple large portosystemic collateral pathways including distal  paraesophageal varices, proximal gastric varices and large left splenorenal shunts including a 3.4 cm varix in the left upper quadrant. 3. New nonocclusive filling defects involving the distal superior mesenteric vein, splenoportal confluence, proximal portal vein and splenic vein, compatible with nonocclusive thrombus. 4. Colonic diverticulosis without evidence of acute diverticulitis at this time. 5. Aortic atherosclerosis. 6. Additional incidental findings, as above. Aortic Atherosclerosis (ICD10-I70.0). Electronically Signed   By: DVinnie LangtonM.D.   On: 12/25/2016 10:46    Labs:  CBC:  Recent Labs  05/08/16 1342 06/12/16 1126 06/15/16 0344 12/04/16 1525  WBC 2.1* 2.4* 4.7 2.8*  HGB 11.8 11.7* 10.9* 12.0  HCT 34.2* 33.2* 31.5* 35.2  PLT 104* 87* 80* 92*    COAGS:  Recent Labs  03/18/16 2114 05/01/16 0750 06/12/16 1126  INR 1.26 1.22 1.19  APTT 31 32  --  BMP:  Recent Labs  05/08/16 1342 06/12/16 1126 06/15/16 0344 12/04/16 1525  NA 131* 140 140 140  K 4.2 4.2 3.8 3.7  CL 102 111 110 107  CO2 27 25 24 24   GLUCOSE 274* 129* 116* 76  BUN 10 13 14 12   CALCIUM 9.4 8.9 8.4* 8.9  CREATININE 0.70 0.70 0.72 0.72  GFRNONAA 86 >60 >60 82  GFRAA 99 >60 >60 95    LIVER FUNCTION TESTS:  Recent Labs  05/01/16 0750 05/08/16 1342 06/12/16 1126 06/15/16 0344 12/04/16 1525  BILITOT 1.4* 0.7 1.0 1.8* 1.5*  AST 59* 93* 52* 200* 54*  ALT 48 65* 36 82* 36*  ALKPHOS 96 141* 88 94  --   PROT 6.6 6.6 6.4* 6.1* 6.4  ALBUMIN 3.4* 3.3* 3.3* 2.9*  --     TUMOR MARKERS:  Recent Labs  03/21/16 0510 03/22/16 0821 03/23/16 0622  AFPTM  --  415.9* 352.9*  CEA 1.5  --   --     Assessment and Plan:  Beverly Shella Spearing is a 76 year old female, status post treatment for right liver HCC, with combination therapy completed 06/14/2016 with microwave ablation.    She has done fine with recovery, and her CT study performed today shows treated liver with no concern for  residual disease, essentially complete response.    I discussed our preferred surveillance schedule, which would be CT scanning every 3-4 months for the first year, after which time she may move towards surveillance for her underlying cirrhosis.  I emphasized that, although there is an expense with surveillance medical imaging, it is important given her underlying cirrhosis.  Typically we use staggered [place] and cross-sectional imaging.    At this point we will schedule her for a repeat CT scan in about 4 months with an office visit.  I have encouraged her to observe her appointment with her physicians, including for lab studies.    Plan: - Repeat CT with contrast of the abd in ~4 months with office visit.  Abd/pelvis is likely not necessary.   - I have encouraged her to observe her follow up with her physicians.      Electronically Signed: Corrie Mckusick 12/25/2016, 2:39 PM   I spent a total of    25 Minutes in face to face in clinical consultation, greater than 50% of which was counseling/coordinating care for cirrhosis, HCC, status post ablation of [place].

## 2016-12-26 ENCOUNTER — Ambulatory Visit: Payer: PPO | Admitting: Hematology & Oncology

## 2016-12-26 ENCOUNTER — Telehealth: Payer: Self-pay | Admitting: Hematology & Oncology

## 2016-12-26 ENCOUNTER — Ambulatory Visit: Payer: PPO

## 2016-12-26 NOTE — Telephone Encounter (Signed)
lvm to inform pt of added office visit for today 10/4 per MD

## 2017-01-01 ENCOUNTER — Ambulatory Visit (HOSPITAL_BASED_OUTPATIENT_CLINIC_OR_DEPARTMENT_OTHER): Payer: PPO | Admitting: Family

## 2017-01-01 ENCOUNTER — Other Ambulatory Visit (HOSPITAL_BASED_OUTPATIENT_CLINIC_OR_DEPARTMENT_OTHER): Payer: PPO

## 2017-01-01 VITALS — BP 107/49 | HR 56 | Temp 98.0°F | Resp 17 | Wt 144.0 lb

## 2017-01-01 DIAGNOSIS — C220 Liver cell carcinoma: Secondary | ICD-10-CM

## 2017-01-01 LAB — CMP (CANCER CENTER ONLY)
ALBUMIN: 2.9 g/dL — AB (ref 3.3–5.5)
ALT(SGPT): 48 U/L — ABNORMAL HIGH (ref 10–47)
AST: 45 U/L — ABNORMAL HIGH (ref 11–38)
Alkaline Phosphatase: 81 U/L (ref 26–84)
BILIRUBIN TOTAL: 1.3 mg/dL (ref 0.20–1.60)
BUN, Bld: 9 mg/dL (ref 7–22)
CHLORIDE: 107 meq/L (ref 98–108)
CO2: 29 meq/L (ref 18–33)
CREATININE: 0.8 mg/dL (ref 0.6–1.2)
Calcium: 9.4 mg/dL (ref 8.0–10.3)
Glucose, Bld: 270 mg/dL — ABNORMAL HIGH (ref 73–118)
Potassium: 4.1 mEq/L (ref 3.3–4.7)
SODIUM: 139 meq/L (ref 128–145)
TOTAL PROTEIN: 5.9 g/dL — AB (ref 6.4–8.1)

## 2017-01-01 LAB — CBC WITH DIFFERENTIAL (CANCER CENTER ONLY)
BASO#: 0 10*3/uL (ref 0.0–0.2)
BASO%: 0.4 % (ref 0.0–2.0)
EOS ABS: 0.1 10*3/uL (ref 0.0–0.5)
EOS%: 3.3 % (ref 0.0–7.0)
HCT: 35.8 % (ref 34.8–46.6)
HEMOGLOBIN: 12.4 g/dL (ref 11.6–15.9)
LYMPH#: 0.6 10*3/uL — ABNORMAL LOW (ref 0.9–3.3)
LYMPH%: 22.9 % (ref 14.0–48.0)
MCH: 35.8 pg — AB (ref 26.0–34.0)
MCHC: 34.6 g/dL (ref 32.0–36.0)
MCV: 104 fL — ABNORMAL HIGH (ref 81–101)
MONO#: 0.3 10*3/uL (ref 0.1–0.9)
MONO%: 11.4 % (ref 0.0–13.0)
NEUT%: 62 % (ref 39.6–80.0)
NEUTROS ABS: 1.7 10*3/uL (ref 1.5–6.5)
PLATELETS: 72 10*3/uL — AB (ref 145–400)
RBC: 3.46 10*6/uL — AB (ref 3.70–5.32)
RDW: 12.9 % (ref 11.1–15.7)
WBC: 2.7 10*3/uL — AB (ref 3.9–10.0)

## 2017-01-01 LAB — LACTATE DEHYDROGENASE: LDH: 254 U/L — ABNORMAL HIGH (ref 125–245)

## 2017-01-01 NOTE — Progress Notes (Signed)
Hematology and Oncology Follow Up Visit  Beverly Davis [id number] [birth year] 76 y.o. 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  CT scan showed no evidence of residual /recurrent disease.  Hgb is stable at 12.4 with an MCV of 104. Platelet count is 72.  No fever, chills, n/v, cough, rash, dizziness, SOB, chest pain, palpitations, abdominal pain or changes in bowel or bladder habits.  She has occasional constipation and takes lactulose as needed.  No episodes of bleeding, bruising or petechiae. No lymphadenopathy found on exam.  No swelling, tenderness, numbness or tingling in her extremities. No c/o pain.  She has maintained a good appetite and is staying well hydrated. Her weight is stable.   ECOG Performance Status: 0 - Asymptomatic  Medications:  Allergies as of 01/01/2017      Reactions   Metformin And Related Other (See Comments)   Welchol [colesevelam Hcl] Other (See Comments)      Medication List       Accurate as of 01/01/17  9:02 PM. Always use your most recent med list.          alendronate 70 MG tablet Commonly known as:  FOSAMAX TAKE 1 TABLET BY MOUTH ONCE A WEEK   Dulaglutide 0.75 MG/0.5ML Sopn Commonly known as:  TRULICITY Inject 0.5 mLs into the skin once a week.   glipiZIDE 5 MG tablet Commonly known as:  GLUCOTROL TAKE ONE TABLET BY MOUTH THREE TIMES DAILY BEFORE MEAL(S).   lactulose 10 GM/15ML solution Commonly known as:  CHRONULAC Take 30 mLs (20 g total) by mouth 3 (three) times daily.   levothyroxine 150 MCG tablet Commonly known  as:  SYNTHROID, LEVOTHROID TAKE ONE TO ONE & ONE-HALF TABLETS BY MOUTH ONCE DAILY AS DIRECTED.   linagliptin 5 MG Tabs tablet Commonly known as:  TRADJENTA Take 1 tablet (5 mg total) by mouth daily.   ONE TOUCH ULTRA TEST test strip Generic drug:  glucose blood CHECK BLOOD SUGAR 1 TIME DAILY   ONETOUCH DELICA LANCETS 76P Misc USE TO CHECK BLOOD SUGAR 1 TIME DAILY   triamcinolone cream 0.5 % Commonly known as:  KENALOG Apply 1 application topically 2 (two) times daily.   Vitamin D 2000 units tablet Take 4,000 Units by mouth 2 (two) times daily.       Allergies:  Allergies  Allergen Reactions  . Metformin And Related Other (See Comments)  . Welchol [Colesevelam Hcl] Other (See Comments)    Past Medical History, Surgical history, Social history, and Family History were reviewed and updated.  Review of Systems: All other 10 point review of systems is negative.   Physical Exam:  weight is 144 lb (65.3 kg). Her oral temperature is 98 F (36.7 C). Her blood pressure is 107/49 (abnormal) and her pulse is 56 (abnormal). Her respiration is 17 and oxygen saturation is 100%.   Wt Readings from Last 3 Encounters:  01/01/17 144 lb (65.3 kg)  12/25/16 141 lb (64 kg)  12/04/16 141 lb 12.8 oz (64.3 kg)    Ocular: Sclerae unicteric, pupils equal, round and reactive to light Ear-nose-throat: Oropharynx clear, dentition fair Lymphatic: No cervical, supraclavicular or  axillary adenopathy Lungs no rales or rhonchi, good excursion bilaterally Heart regular rate and rhythm, no murmur appreciated Abd soft, nontender, positive bowel sounds, no liver or spleen tip palpated on exam, no fluid wave  MSK no focal spinal tenderness, no joint edema Neuro: non-focal, well-oriented, appropriate affect Breasts: Deferred   Lab Results  Component Value Date   WBC 2.7 (L) 01/01/2017   HGB 12.4 01/01/2017   HCT 35.8 01/01/2017   MCV 104 (H) 01/01/2017   PLT 72 (L) 01/01/2017   Lab Results    Component Value Date   FERRITIN 119 03/23/2016   IRON 36 03/23/2016   TIBC 234 (L) 03/23/2016   UIBC 198 03/23/2016   IRONPCTSAT 15 03/23/2016   Lab Results  Component Value Date   RETICCTPCT 1.2 09/05/2015   RBC 3.46 (L) 01/01/2017   RETICCTABS 40,080 09/05/2015   No results found for: KPAFRELGTCHN, LAMBDASER, KAPLAMBRATIO No results found for: Osborne Casco Lab Results  Component Value Date   TOTALPROTELP 7.1 02/03/2008   ALBUMINELP 54.8 (L) 02/03/2008   A1GS 4.2 02/03/2008   A2GS 9.5 02/03/2008   BETS 8.1 (H) 02/03/2008   BETA2SER 5.8 02/03/2008   GAMS 17.6 02/03/2008   MSPIKE NOT DET 02/03/2008   SPEI * 02/03/2008     Chemistry      Component Value Date/Time   NA 139 01/01/2017 1034   K 4.1 01/01/2017 1034   CL 107 01/01/2017 1034   CO2 29 01/01/2017 1034   BUN 9 01/01/2017 1034   CREATININE 0.8 01/01/2017 1034      Component Value Date/Time   CALCIUM 9.4 01/01/2017 1034   ALKPHOS 81 01/01/2017 1034   AST 45 (H) 01/01/2017 1034   ALT 48 (H) 01/01/2017 1034   BILITOT 1.30 01/01/2017 1034      Impression and Plan: Beverly Davis is a very pleasant 76 yo caucasian female with history of hepatocellular carcinoma. She had RFA in March and did well. Her CT scan last week showed no evidence of residual/recurrent disease. The CT did show nonocclusive thrombus involving the distal superior mesenteric vein, splenoportal confluence, proximal portal vein and splenic vein. Hgb is stable at 12.4 with MCV of 104. Platelet count is 72.  She is asymptomatic with this and has no complaints at this time. She is also not interested in adding any new medication.  I spoke with Dr. Marin Olp an we will hold off on starting her on an anticoagulant for now.  We will see her back again in another 6 weeks for follow-up and repeat lab work.  We will repeat her CT scan in another 3 months.  She will contact our office with any questions or concerns. We can certainly see her sooner  if need be.    Eliezer Bottom, NP 10/10/20189:02 PM

## 2017-01-02 LAB — AFP TUMOR MARKER: AFP, Serum, Tumor Marker: 3.4 ng/mL (ref 0.0–8.3)

## 2017-01-08 ENCOUNTER — Other Ambulatory Visit: Payer: Self-pay

## 2017-01-15 DIAGNOSIS — K760 Fatty (change of) liver, not elsewhere classified: Secondary | ICD-10-CM | POA: Diagnosis not present

## 2017-01-15 DIAGNOSIS — K7460 Unspecified cirrhosis of liver: Secondary | ICD-10-CM | POA: Diagnosis not present

## 2017-01-15 DIAGNOSIS — C229 Malignant neoplasm of liver, not specified as primary or secondary: Secondary | ICD-10-CM | POA: Diagnosis not present

## 2017-01-21 ENCOUNTER — Encounter: Payer: Self-pay | Admitting: Physician Assistant

## 2017-01-22 ENCOUNTER — Other Ambulatory Visit (INDEPENDENT_AMBULATORY_CARE_PROVIDER_SITE_OTHER): Payer: PPO

## 2017-01-22 ENCOUNTER — Encounter: Payer: Self-pay | Admitting: Interventional Radiology

## 2017-01-22 ENCOUNTER — Telehealth: Payer: Self-pay

## 2017-01-22 DIAGNOSIS — Z23 Encounter for immunization: Secondary | ICD-10-CM

## 2017-01-22 DIAGNOSIS — E039 Hypothyroidism, unspecified: Secondary | ICD-10-CM

## 2017-01-22 LAB — TSH: TSH: 0.03 m[IU]/L — AB (ref 0.40–4.50)

## 2017-01-22 NOTE — Telephone Encounter (Signed)
Patient would like to have her TSH lab results sent to Dr. Carol Ada.

## 2017-02-07 LAB — HM DIABETES EYE EXAM

## 2017-02-12 ENCOUNTER — Other Ambulatory Visit: Payer: PPO

## 2017-02-12 ENCOUNTER — Ambulatory Visit: Payer: PPO | Admitting: Family

## 2017-02-19 ENCOUNTER — Ambulatory Visit (INDEPENDENT_AMBULATORY_CARE_PROVIDER_SITE_OTHER): Payer: PPO

## 2017-02-19 DIAGNOSIS — E039 Hypothyroidism, unspecified: Secondary | ICD-10-CM

## 2017-02-19 LAB — TSH: TSH: 0.85 mIU/L (ref 0.40–4.50)

## 2017-02-19 NOTE — Progress Notes (Signed)
Pt presents for lab TSH & she  reports she takes her thyroid meds as follows: takes half tablet every day.

## 2017-02-25 ENCOUNTER — Encounter: Payer: Self-pay | Admitting: *Deleted

## 2017-03-07 ENCOUNTER — Other Ambulatory Visit: Payer: Self-pay | Admitting: Internal Medicine

## 2017-03-11 ENCOUNTER — Other Ambulatory Visit: Payer: Self-pay

## 2017-03-11 DIAGNOSIS — M810 Age-related osteoporosis without current pathological fracture: Secondary | ICD-10-CM

## 2017-03-11 MED ORDER — ALENDRONATE SODIUM 70 MG PO TABS: 70.0000 mg | ORAL_TABLET | ORAL | 0 refills | Status: DC

## 2017-03-26 ENCOUNTER — Encounter: Payer: Self-pay | Admitting: Internal Medicine

## 2017-04-01 ENCOUNTER — Other Ambulatory Visit: Payer: Self-pay | Admitting: Interventional Radiology

## 2017-04-01 DIAGNOSIS — C220 Liver cell carcinoma: Secondary | ICD-10-CM

## 2017-04-02 ENCOUNTER — Other Ambulatory Visit: Payer: PPO

## 2017-04-02 ENCOUNTER — Other Ambulatory Visit: Payer: PPO | Admitting: Hematology & Oncology

## 2017-04-23 ENCOUNTER — Inpatient Hospital Stay: Payer: PPO | Attending: Hematology & Oncology

## 2017-04-23 ENCOUNTER — Inpatient Hospital Stay (HOSPITAL_BASED_OUTPATIENT_CLINIC_OR_DEPARTMENT_OTHER): Payer: PPO | Admitting: Hematology & Oncology

## 2017-04-23 ENCOUNTER — Other Ambulatory Visit: Payer: Self-pay

## 2017-04-23 ENCOUNTER — Encounter: Payer: Self-pay | Admitting: Hematology & Oncology

## 2017-04-23 VITALS — BP 119/53 | HR 62 | Temp 97.7°F | Resp 20 | Wt 147.8 lb

## 2017-04-23 DIAGNOSIS — Z7984 Long term (current) use of oral hypoglycemic drugs: Secondary | ICD-10-CM

## 2017-04-23 DIAGNOSIS — I81 Portal vein thrombosis: Secondary | ICD-10-CM | POA: Insufficient documentation

## 2017-04-23 DIAGNOSIS — Z79899 Other long term (current) drug therapy: Secondary | ICD-10-CM

## 2017-04-23 DIAGNOSIS — C220 Liver cell carcinoma: Secondary | ICD-10-CM | POA: Diagnosis not present

## 2017-04-23 DIAGNOSIS — H538 Other visual disturbances: Secondary | ICD-10-CM

## 2017-04-23 DIAGNOSIS — Z853 Personal history of malignant neoplasm of breast: Secondary | ICD-10-CM | POA: Diagnosis not present

## 2017-04-23 LAB — CBC WITH DIFFERENTIAL (CANCER CENTER ONLY)
Basophils Absolute: 0 10*3/uL (ref 0.0–0.1)
Basophils Relative: 0 %
Eosinophils Absolute: 0.1 10*3/uL (ref 0.0–0.5)
Eosinophils Relative: 3 %
HEMATOCRIT: 35.5 % (ref 34.8–46.6)
Hemoglobin: 12.3 g/dL (ref 11.6–15.9)
LYMPHS ABS: 0.6 10*3/uL — AB (ref 0.9–3.3)
LYMPHS PCT: 25 %
MCH: 37.3 pg — AB (ref 26.0–34.0)
MCHC: 34.6 g/dL (ref 32.0–36.0)
MCV: 107.6 fL — AB (ref 81.0–101.0)
MONO ABS: 0.2 10*3/uL (ref 0.1–0.9)
MONOS PCT: 11 %
NEUTROS ABS: 1.4 10*3/uL — AB (ref 1.5–6.5)
Neutrophils Relative %: 61 %
Platelet Count: 74 10*3/uL — ABNORMAL LOW (ref 145–400)
RBC: 3.3 MIL/uL — ABNORMAL LOW (ref 3.70–5.32)
RDW: 12.9 % (ref 11.1–15.7)
WBC Count: 2.2 10*3/uL — ABNORMAL LOW (ref 3.9–10.3)

## 2017-04-23 LAB — CMP (CANCER CENTER ONLY)
ALT: 38 U/L (ref 0–55)
ANION GAP: 5 (ref 3–11)
AST: 48 U/L — AB (ref 5–34)
Albumin: 3 g/dL — ABNORMAL LOW (ref 3.5–5.0)
Alkaline Phosphatase: 90 U/L (ref 40–150)
BILIRUBIN TOTAL: 1.3 mg/dL — AB (ref 0.2–1.2)
BUN: 10 mg/dL (ref 7–26)
CO2: 27 mmol/L (ref 22–29)
Calcium: 9.1 mg/dL (ref 8.4–10.4)
Chloride: 107 mmol/L (ref 98–109)
Creatinine: 1.17 mg/dL — ABNORMAL HIGH (ref 0.60–1.10)
GFR, EST AFRICAN AMERICAN: 51 mL/min — AB (ref >60)
GFR, EST NON AFRICAN AMERICAN: 44 mL/min — AB (ref >60)
Glucose, Bld: 318 mg/dL — ABNORMAL HIGH (ref 70–140)
POTASSIUM: 4.6 mmol/L (ref 3.5–5.1)
Sodium: 139 mmol/L (ref 136–145)
TOTAL PROTEIN: 6.2 g/dL — AB (ref 6.4–8.3)

## 2017-04-23 LAB — LACTATE DEHYDROGENASE: LDH: 244 U/L (ref 125–245)

## 2017-04-23 NOTE — Progress Notes (Signed)
Hematology and Oncology Follow Up Visit  Beverly Davis [id number] June 19, 1940 77 y.o. 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  She is doing okay.  She had no problems over the holiday season.  She had no problems with bleeding or bruising.  She had no abdominal pain.  There is no nausea or vomiting.  She does have this nonocclusive thrombus in the portal system..  This was found on routine CT scan.  She is asymptomatic with this.  We decided not to put her on any anticoagulation.  She has had no cough or shortness of breath.  She has had no fever.  She has had no change in bowel or bladder habits.  She is had no bleeding.  She has had no diarrhea.  There is been no leg swelling.  Her last alpha-fetoprotein level was normal at 3.4.  Overall, I would say performance status is ECOG 1.   Medications:  Allergies as of 04/23/2017      Reactions   Metformin And Related Other (See Comments)   Welchol [colesevelam Hcl] Other (See Comments)      Medication List        Accurate as of 04/23/17 10:49 AM. Always use your most recent med list.          alendronate 70 MG tablet Commonly known as:  FOSAMAX Take 1 tablet (70 mg total) by mouth once a week. Take with a full glass of water on an empty stomach.   Dulaglutide 0.75 MG/0.5ML Sopn Commonly known as:  TRULICITY Inject 0.5 mLs into the skin once a week.   glipiZIDE 5 MG tablet Commonly known as:  GLUCOTROL TAKE 1 TABLET BY MOUTH THREE TIMES DAILY BEFORE MEALS   lactulose 10 GM/15ML solution Commonly known as:  CHRONULAC Take 30 mLs (20 g total) by mouth 3 (three) times daily.   levothyroxine 150 MCG tablet Commonly known as:   SYNTHROID, LEVOTHROID TAKE 1 TO 1 & 1/2  TABLETS BY MOUTH ONCE DAILY AS DIRECTED   linagliptin 5 MG Tabs tablet Commonly known as:  TRADJENTA Take 1 tablet (5 mg total) by mouth daily.   ONE TOUCH ULTRA TEST test strip Generic drug:  glucose blood CHECK BLOOD SUGAR 1 TIME DAILY   ONETOUCH DELICA LANCETS 67H Misc USE TO CHECK BLOOD SUGAR 1 TIME DAILY   triamcinolone cream 0.5 % Commonly known as:  KENALOG Apply 1 application topically 2 (two) times daily.   Vitamin D 2000 units tablet Take 4,000 Units by mouth 2 (two) times daily.       Allergies:  Allergies  Allergen Reactions  . Metformin And Related Other (See Comments)  . Welchol [Colesevelam Hcl] Other (See Comments)    Past Medical History, Surgical history, Social history, and Family History were reviewed and updated.  Review of Systems: Review of Systems  Constitutional: Negative.   HENT: Negative.   Eyes: Positive for blurred vision.  Respiratory: Negative.   Cardiovascular: Negative.   Gastrointestinal: Negative.   Genitourinary: Negative.   Musculoskeletal: Negative.   Skin: Negative.   Neurological: Negative.   Endo/Heme/Allergies: Negative.   Psychiatric/Behavioral: Negative.       Physical Exam:  weight is 147 lb 12.8 oz (67 kg). Her oral  temperature is 97.7 F (36.5 C). Her blood pressure is 119/53 (abnormal) and her pulse is 62. Her respiration is 20 and oxygen saturation is 100%.   Wt Readings from Last 3 Encounters:  04/23/17 147 lb 12.8 oz (67 kg)  01/01/17 144 lb (65.3 kg)  12/25/16 141 lb (64 kg)    Physical Exam  Constitutional: She is oriented to person, place, and time.  HENT:  Head: Normocephalic and atraumatic.  Mouth/Throat: Oropharynx is clear and moist.  Eyes: EOM are normal. Pupils are equal, round, and reactive to light.  Neck: Normal range of motion.  Cardiovascular: Normal rate, regular rhythm and normal heart sounds.  Pulmonary/Chest: Effort normal and breath sounds  normal.  Abdominal: Soft. Bowel sounds are normal.  Musculoskeletal: Normal range of motion. She exhibits no edema, tenderness or deformity.  Lymphadenopathy:    She has no cervical adenopathy.  Neurological: She is alert and oriented to person, place, and time.  Skin: Skin is warm and dry. No rash noted. No erythema.  Psychiatric: She has a normal mood and affect. Her behavior is normal. Judgment and thought content normal.  Vitals reviewed.   Lab Results  Component Value Date   WBC 2.2 (L) 04/23/2017   HGB 12.4 01/01/2017   HCT 35.5 04/23/2017   MCV 107.6 (H) 04/23/2017   PLT 74 (L) 04/23/2017   Lab Results  Component Value Date   FERRITIN 119 03/23/2016   IRON 36 03/23/2016   TIBC 234 (L) 03/23/2016   UIBC 198 03/23/2016   IRONPCTSAT 15 03/23/2016   Lab Results  Component Value Date   RETICCTPCT 1.2 09/05/2015   RBC 3.30 (L) 04/23/2017   RETICCTABS 40,080 09/05/2015   No results found for: KPAFRELGTCHN, LAMBDASER, KAPLAMBRATIO No results found for: Osborne Casco Lab Results  Component Value Date   TOTALPROTELP 7.1 02/03/2008   ALBUMINELP 54.8 (L) 02/03/2008   A1GS 4.2 02/03/2008   A2GS 9.5 02/03/2008   BETS 8.1 (H) 02/03/2008   BETA2SER 5.8 02/03/2008   GAMS 17.6 02/03/2008   MSPIKE NOT DET 02/03/2008   SPEI * 02/03/2008     Chemistry      Component Value Date/Time   NA 139 01/01/2017 1034   K 4.1 01/01/2017 1034   CL 107 01/01/2017 1034   CO2 29 01/01/2017 1034   BUN 9 01/01/2017 1034   CREATININE 0.8 01/01/2017 1034      Component Value Date/Time   CALCIUM 9.4 01/01/2017 1034   ALKPHOS 81 01/01/2017 1034   AST 45 (H) 01/01/2017 1034   ALT 48 (H) 01/01/2017 1034   BILITOT 1.30 01/01/2017 1034      Impression and Plan: Beverly Davis is a very pleasant 77 yo caucasian female with history of hepatocellular carcinoma. She had RFA in March and did well.  Her last alpha-fetoprotein was down to normal.  I will repeat a CT scan in March.  I  would like to see how the liver looks but also I would like to see if there is any change in this portal splenic thrombus.  If this thrombus has progressed, we may have to consider anticoagulation therapy for her.  Currently, I do not see any issues with this thrombus.  She is asymptomatic with it.  We will see her back after she has her CT scan done in March.   [name], MD 1/30/201910:49 AM

## 2017-05-06 NOTE — Progress Notes (Signed)
[name] [place], M.D.     Beverly Davis, [place] Beverly Davis, [place] [address] [place] [address] Telephone [phone] Telefax [phone] Annual Screening/Preventative Visit & Comprehensive Evaluation &  Examination     This very nice 77 y.o. MWFpresents for a Screening/Preventative Visit & comprehensive evaluation and management of multiple medical co-morbidities.  Patient has been followed for HTN, T2_NIDDM, Hypothyroidism, Hyperlipidemia and Vitamin D Deficiency. Patient has remote hx/o Rt Breast cancer in 2008. Patient is followed by Dr Marin Olp.      Patient does have hx/o NASH and cirrhosis and prior hospitalization w/ Hepatic Encephalopathy. In Nov 2017 she was discovered to have [facility] and in Feb 2018, she had Radio-embolization of a Rt Hepatic Lobe HCC, then followed in March 2018 with a thermal microwave ablation. 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      Patient is followed expectantly for labile HTN predates since     . Patient's BP has been controlled at home and patient denies any cardiac symptoms as chest pain, palpitations, shortness of breath, dizziness or ankle swelling. Today's BP is at goal - 116/68.      Patient's hyperlipidemia is controlled with diet and medications. Patient denies myalgias or other medication SE's. Last lipids were at goal: Lab Results  Component Value Date   CHOL 194 12/04/2016   HDL 74 12/04/2016   LDLCALC 86 12/27/2015   TRIG 82 12/04/2016   CHOLHDL 2.6 12/04/2016      Patient has T2_NIDDM (2007) and compliance has been poor compromised by her lack of comprehension.   Patient had Metformin d/c'd for obscure reasons and switched to Trulicity samples  along with Tradjenta samples and her Glipizide.She alleges CBG's have ranged in the 130-150's up to 200  range.  Patient denies reactive hypoglycemic symptoms, visual blurring, diabetic polys, or paresthesias. Last A1c was not at goal: Lab Results  Component Value Date   HGBA1C 6.2 (H) 12/04/2016      Finally, patient has history of Vitamin D Deficiency ("35"/2015) and last Vitamin D was at goal: Lab Results  Component Value Date   VD25OH 81 12/04/2016   Current Outpatient Medications on File Prior to Visit  Medication Sig  . FOSAMAX  70 MG tablet Take 1 tab once a week.   Marland Kitchen VITAMIN D 5,000 Units Take daily.  . TRULICITY 4.85 IO/2.7OJ SOPN Inject 0.5 mLs  once a week.  Marland Kitchen glipiZIDE  5 MG tablet TAKE 1 TAB 3 x  DAILY BEFORE MEALS  . lactulose  10 GM/15ML solution Patient taking  20 g  daily  . levothyroxine  150 MCG tablet TAKEs 1/2 tab daily  . TRADJENTA 5 MG TABS tablet Take 1 tab daily.  Marland Kitchen triamcinolone cream (KENALOG) 0.5 % Apply 1 application topically 2 times daily.   Allergies  Allergen Reactions  . Metformin And Related Other (See Comments)  . Welchol [Colesevelam Hcl] Other (See Comments)   Past Medical History:  Diagnosis Date  . Breast cancer ([place]) 05/2006   Right breast cancer  . Cirrhosis ([place])   . Diabetes mellitus without complication ([place])    type 2  . Fibroids   . Hepatocellular carcinoma ([place]) 05/08/2016  . Hypothyroidism   . Osteopenia   . Thrombocytopenia ([place])   . Thyroid disease    Health Maintenance  Topic Date Due  .  FOOT EXAM  12/26/2016  . URINE MICROALBUMIN  12/26/2016  . HEMOGLOBIN A1C  06/03/2017  . OPHTHALMOLOGY EXAM  02/07/2018  . TETANUS/TDAP  06/11/2022  . COLONOSCOPY  03/08/2025  . INFLUENZA VACCINE  Completed  . DEXA SCAN  Completed  . PNA vac Low Risk Adult  Completed   Immunization History  Administered Date(s) Administered  . Influenza Split 03/06/2011, 01/06/2013  . Influenza, High Dose Seasonal PF 01/12/2014, 12/14/2014, 12/27/2015, 01/22/2017  . Pneumococcal Conjugate-13 01/12/2014  . Pneumococcal Polysaccharide-23 08/30/2014   . Tdap 06/10/2012   Last Colon -  Last Pap -  Past Surgical History:  Procedure Laterality Date  . ABDOMINAL HYSTERECTOMY     TAH BSO  . BREAST LUMPECTOMY WITH AXILLARY LYMPH NODE BIOPSY Right 06/25/2006  . CHOLECYSTECTOMY    . IR GENERIC HISTORICAL  03/28/2016   IR RADIOLOGIST EVAL & MGMT 03/28/2016 Beverly Mckusick, DO [facility]  . IR GENERIC HISTORICAL  05/01/2016   IR ANGIOGRAM VISCERAL SELECTIVE 05/01/2016 Beverly Mckusick, DO [facility]  . IR GENERIC HISTORICAL  05/01/2016   IR ANGIOGRAM VISCERAL SELECTIVE 05/01/2016 Beverly Mckusick, DO [facility]  . IR GENERIC HISTORICAL  05/01/2016   IR ANGIOGRAM SELECTIVE EACH ADDITIONAL VESSEL 05/01/2016 Beverly Mckusick, DO [facility]  . IR GENERIC HISTORICAL  05/01/2016   IR ANGIOGRAM SELECTIVE EACH ADDITIONAL VESSEL 05/01/2016 Beverly Mckusick, DO [facility]  . IR GENERIC HISTORICAL  05/01/2016   IR ANGIOGRAM SELECTIVE EACH ADDITIONAL VESSEL 05/01/2016 Beverly Mckusick, DO [facility]  . IR GENERIC HISTORICAL  05/01/2016   IR EMBO TUMOR ORGAN ISCHEMIA INFARCT INC GUIDE ROADMAPPING 05/01/2016 Beverly Mckusick, DO [facility]  . IR GENERIC HISTORICAL  05/01/2016   IR CT SPINE LTD 05/01/2016 Beverly Mckusick, DO [facility]  . IR GENERIC HISTORICAL  05/01/2016   IR US GUIDE VASC ACCESS RIGHT 05/01/2016 Beverly Mckusick, DO [facility]  . IR GENERIC HISTORICAL  05/01/2016   IR ANGIOGRAM SELECTIVE EACH ADDITIONAL VESSEL 05/01/2016 Beverly Mckusick, DO [facility]  . IR RADIOLOGIST EVAL & MGMT  07/31/2016  . IR RADIOLOGIST EVAL & MGMT  12/25/2016  . RADIOFREQUENCY ABLATION N/A 06/14/2016   Procedure: LIVER MICROWAVE THERMAL  ABLATION;  Surgeon: Beverly Mckusick, DO;  Location: [facility];  Service: Anesthesiology;  Laterality: N/A;  . TUBAL LIGATION  1978   Family History  Problem Relation Age of Onset  . Cancer Mother        Brain cancer  . Cancer Father        Prostate cancer  . Diabetes Sister   . Hypertension Sister   . Liver disease Brother   . Diabetes Maternal Aunt   . Heart Problems  Maternal Grandfather   . Cancer Paternal Grandfather        Unknown cancer  . Diabetes Sister    Social History   Tobacco Use  . Smoking status: Never Smoker  . Smokeless tobacco: Never Used  Substance Use Topics  . Alcohol use: No  . Drug use: No    ROS Constitutional: Denies fever, chills, weight loss/gain, headaches, insomnia,  night sweats, and change in appetite. Does c/o fatigue. Eyes: Denies redness, blurred vision, diplopia, discharge, itchy, watery eyes.  ENT: Denies discharge, congestion, post nasal drip, epistaxis, sore throat, earache, hearing loss, dental pain, Tinnitus, Vertigo, Sinus pain, snoring.  Cardio: Denies chest pain, palpitations, irregular heartbeat, syncope, dyspnea, diaphoresis, orthopnea, PND, claudication, edema Respiratory: denies cough, dyspnea, DOE, pleurisy, hoarseness, laryngitis, wheezing.  Gastrointestinal: Denies dysphagia, heartburn, reflux, water brash,  pain, cramps, nausea, vomiting, bloating, diarrhea, constipation, hematemesis, melena, hematochezia, jaundice, hemorrhoids Genitourinary: Denies dysuria, frequency, urgency, nocturia, hesitancy, discharge, hematuria, flank pain Breast: Breast lumps, nipple discharge, bleeding.  Musculoskeletal: Denies arthralgia, myalgia, stiffness, Jt. Swelling, pain, limp, and strain/sprain. Denies falls. Skin: Denies puritis, rash, hives, warts, acne, eczema, changing in skin lesion Neuro: No weakness, tremor, incoordination, spasms, paresthesia, pain Psychiatric: Denies confusion, memory loss, sensory loss. Denies Depression. Endocrine: Denies change in weight, skin, hair change, nocturia, and paresthesia, diabetic polys, visual blurring, hyper / hypo glycemic episodes.  Heme/Lymph: No excessive bleeding, bruising, enlarged lymph nodes.  Physical Exam  BP 116/68   Pulse 68   Temp (!) 97 F (36.1 C)   Resp 16   Ht 5' 2"  (1.575 m)   Wt 145 lb 6.4 oz (66 kg)   BMI 26.59 kg/m   General Appearance: Well  nourished, well groomed and in no apparent distress.  Eyes: PERRLA, EOMs, conjunctiva no swelling or erythema, normal fundi and vessels. Sinuses: No frontal/maxillary tenderness ENT/Mouth: EACs patent / TMs  nl. Nares clear without erythema, swelling, mucoid exudates. Oral hygiene is good. No erythema, swelling, or exudate. Tongue normal, non-obstructing. Tonsils not swollen or erythematous. Hearing normal.  Neck: Supple, thyroid not palpable. No bruits, nodes or JVD. Respiratory: Respiratory effort normal.  BS equal and clear bilateral without rales, rhonci, wheezing or stridor. Cardio: Heart sounds are normal with regular rate and rhythm and no murmurs, rubs or gallops. Peripheral pulses are normal and equal bilaterally without edema. No aortic or femoral bruits. Chest: symmetric with normal excursions and percussion. Breasts: Symmetric, without lumps, nipple discharge, retractions, or fibrocystic changes.  Abdomen: Flat, soft with bowel sounds active. Nontender, no guarding, rebound, hernias, masses, or organomegaly.  Lymphatics: Non tender without lymphadenopathy.  Genitourinary:  Musculoskeletal: Full ROM all peripheral extremities, joint stability, 5/5 strength, and normal gait. Skin: Warm and dry without rashes, lesions, cyanosis, clubbing or  ecchymosis.  Neuro: Cranial nerves intact, reflexes equal bilaterally. Normal muscle tone, no cerebellar symptoms. Sensation intact.  Pysch: Alert and oriented X 3, normal affect, Insight and Judgment appropriate.   Assessment and Plan  1. Annual Preventative Screening Examination  2. Essential hypertension  - EKG 12-Lead - Urinalysis, Routine w reflex microscopic - Microalbumin / creatinine urine ratio - CBC with Differential/Platelet - BASIC METABOLIC PANEL WITH GFR - Magnesium - TSH  3. Hyperlipidemia, mixed  - EKG 12-Lead - Hepatic function panel - Lipid panel - TSH  4. Type 2 diabetes mellitus with stage 2 chronic kidney  disease, without long-term current use of insulin (HCC)  - For lack of Trulicity  & Tradjenta Sx's, patient advised to continue only her Glipizide  3 x / day w/ meals & given sx's Janumet 50/1,000 XR  To take 2 x/day x 2 & 1/2 month supply. Advised to monitor CBG's 1-2 x/ day and return in 6 weeks or call sooner if questions    - EKG 12-Lead - Urinalysis, Routine w reflex microscopic - Microalbumin / creatinine urine ratio - HM DIABETES FOOT EXAM - LOW EXTREMITY NEUR EXAM DOCUM - Hemoglobin A1c - Insulin, random  5. Vitamin D deficiency  - VITAMIN D 25 Hydroxy  6. Hypothyroidism  - TSH  7. History of hepatocellular carcinoma   8. NASH (nonalcoholic steatohepatitis)  - Protime-INR - Hepatic function panel  9. Liver cirrhosis secondary to NASH ([place])  - Protime-INR - Hepatic function panel - Ammonia  10. History of right breast cancer   11. Other  vitamin B12 deficiency anemia  - Vitamin B12  12. Screening for ischemic heart disease  - EKG 12-Lead  13. Encounter for colorectal cancer screening  - POC Hemoccult Bld/Stool  14. Coagulopathy (HCC)  - Protime-INR  15. Hepatic encephalopathy (HCC)  - Ammonia  16. Medication management  - Urinalysis, Routine w reflex microscopic - Microalbumin / creatinine urine ratio - Protime-INR - CBC with Differential/Platelet - BASIC METABOLIC PANEL WITH GFR - Hepatic function panel - Magnesium - Lipid panel - TSH - Hemoglobin A1c - Insulin, random - VITAMIN D 25 Hydroxy  17. Pancytopenia ([place])        Patient was counseled in prudent diet to achieve/maintain BMI less than 25 for weight control, BP monitoring, regular exercise and medications. Discussed med's effects and SE's. Screening labs and tests as requested with regular follow-up as recommended. Over 40 minutes of exam, counseling, chart review and high complex critical decision making was performed.

## 2017-05-06 NOTE — Patient Instructions (Signed)

## 2017-05-07 ENCOUNTER — Encounter: Payer: Self-pay | Admitting: Internal Medicine

## 2017-05-07 ENCOUNTER — Ambulatory Visit (INDEPENDENT_AMBULATORY_CARE_PROVIDER_SITE_OTHER): Payer: PPO | Admitting: Internal Medicine

## 2017-05-07 VITALS — BP 116/68 | HR 68 | Temp 97.0°F | Resp 16 | Ht 62.0 in | Wt 145.4 lb

## 2017-05-07 DIAGNOSIS — Z79899 Other long term (current) drug therapy: Secondary | ICD-10-CM

## 2017-05-07 DIAGNOSIS — D689 Coagulation defect, unspecified: Secondary | ICD-10-CM

## 2017-05-07 DIAGNOSIS — D61818 Other pancytopenia: Secondary | ICD-10-CM

## 2017-05-07 DIAGNOSIS — Z0000 Encounter for general adult medical examination without abnormal findings: Secondary | ICD-10-CM | POA: Diagnosis not present

## 2017-05-07 DIAGNOSIS — K7581 Nonalcoholic steatohepatitis (NASH): Secondary | ICD-10-CM

## 2017-05-07 DIAGNOSIS — Z0001 Encounter for general adult medical examination with abnormal findings: Secondary | ICD-10-CM

## 2017-05-07 DIAGNOSIS — E1122 Type 2 diabetes mellitus with diabetic chronic kidney disease: Secondary | ICD-10-CM | POA: Diagnosis not present

## 2017-05-07 DIAGNOSIS — E782 Mixed hyperlipidemia: Secondary | ICD-10-CM

## 2017-05-07 DIAGNOSIS — Z136 Encounter for screening for cardiovascular disorders: Secondary | ICD-10-CM | POA: Diagnosis not present

## 2017-05-07 DIAGNOSIS — K7290 Hepatic failure, unspecified without coma: Secondary | ICD-10-CM

## 2017-05-07 DIAGNOSIS — E559 Vitamin D deficiency, unspecified: Secondary | ICD-10-CM

## 2017-05-07 DIAGNOSIS — I10 Essential (primary) hypertension: Secondary | ICD-10-CM | POA: Diagnosis not present

## 2017-05-07 DIAGNOSIS — D518 Other vitamin B12 deficiency anemias: Secondary | ICD-10-CM

## 2017-05-07 DIAGNOSIS — K7682 Hepatic encephalopathy: Secondary | ICD-10-CM

## 2017-05-07 DIAGNOSIS — Z1212 Encounter for screening for malignant neoplasm of rectum: Secondary | ICD-10-CM

## 2017-05-07 DIAGNOSIS — N182 Chronic kidney disease, stage 2 (mild): Secondary | ICD-10-CM

## 2017-05-07 DIAGNOSIS — K7460 Unspecified cirrhosis of liver: Secondary | ICD-10-CM

## 2017-05-07 DIAGNOSIS — Z853 Personal history of malignant neoplasm of breast: Secondary | ICD-10-CM

## 2017-05-07 DIAGNOSIS — Z1211 Encounter for screening for malignant neoplasm of colon: Secondary | ICD-10-CM

## 2017-05-07 DIAGNOSIS — Z8505 Personal history of malignant neoplasm of liver: Secondary | ICD-10-CM

## 2017-05-07 DIAGNOSIS — E039 Hypothyroidism, unspecified: Secondary | ICD-10-CM

## 2017-05-07 MED ORDER — METFORMIN HCL ER 500 MG PO TB24: ORAL_TABLET | ORAL | 1 refills | Status: DC

## 2017-05-08 LAB — LIPID PANEL
CHOL/HDL RATIO: 2.6 (calc) (ref <5.0)
Cholesterol: 209 mg/dL — ABNORMAL HIGH (ref <200)
HDL: 80 mg/dL (ref >50)
LDL Cholesterol (Calc): 112 mg/dL (calc) — ABNORMAL HIGH
NON-HDL CHOLESTEROL (CALC): 129 mg/dL (ref <130)
Triglycerides: 76 mg/dL (ref <150)

## 2017-05-08 LAB — TSH: TSH: 1.33 m[IU]/L (ref 0.40–4.50)

## 2017-05-08 LAB — URINALYSIS, ROUTINE W REFLEX MICROSCOPIC
Bilirubin Urine: NEGATIVE
Glucose, UA: NEGATIVE
Hgb urine dipstick: NEGATIVE
Ketones, ur: NEGATIVE
Leukocytes, UA: NEGATIVE
NITRITE: NEGATIVE
PROTEIN: NEGATIVE
Specific Gravity, Urine: 1.02 (ref 1.001–1.03)
pH: <=5 (ref 5.0–8.0)

## 2017-05-08 LAB — CBC WITH DIFFERENTIAL/PLATELET
BASOS ABS: 29 {cells}/uL (ref 0–200)
Basophils Relative: 1 %
EOS ABS: 81 {cells}/uL (ref 15–500)
Eosinophils Relative: 2.8 %
HCT: 36.2 % (ref 35.0–45.0)
Hemoglobin: 12.8 g/dL (ref 11.7–15.5)
Lymphs Abs: 766 cells/uL — ABNORMAL LOW (ref 850–3900)
MCH: 37.3 pg — AB (ref 27.0–33.0)
MCHC: 35.4 g/dL (ref 32.0–36.0)
MCV: 105.5 fL — AB (ref 80.0–100.0)
MONOS PCT: 10.1 %
MPV: 11.2 fL (ref 7.5–12.5)
NEUTROS PCT: 59.7 %
Neutro Abs: 1731 cells/uL (ref 1500–7800)
PLATELETS: 89 10*3/uL — AB (ref 140–400)
RBC: 3.43 10*6/uL — ABNORMAL LOW (ref 3.80–5.10)
RDW: 12.8 % (ref 11.0–15.0)
TOTAL LYMPHOCYTE: 26.4 %
WBC mixed population: 293 cells/uL (ref 200–950)
WBC: 2.9 10*3/uL — ABNORMAL LOW (ref 3.8–10.8)

## 2017-05-08 LAB — INSULIN, RANDOM: INSULIN: 13.1 u[IU]/mL (ref 2.0–19.6)

## 2017-05-08 LAB — BASIC METABOLIC PANEL WITH GFR
BUN: 11 mg/dL (ref 7–25)
CHLORIDE: 107 mmol/L (ref 98–110)
CO2: 27 mmol/L (ref 20–32)
Calcium: 9 mg/dL (ref 8.6–10.4)
Creat: 0.74 mg/dL (ref 0.60–0.93)
GFR, EST AFRICAN AMERICAN: 92 mL/min/{1.73_m2} (ref >=60)
GFR, Est Non African American: 79 mL/min/{1.73_m2} (ref >=60)
Glucose, Bld: 105 mg/dL — ABNORMAL HIGH (ref 65–99)
Potassium: 4 mmol/L (ref 3.5–5.3)
Sodium: 139 mmol/L (ref 135–146)

## 2017-05-08 LAB — HEPATIC FUNCTION PANEL
AG Ratio: 1.2 (calc) (ref 1.0–2.5)
ALT: 33 U/L — ABNORMAL HIGH (ref 6–29)
AST: 46 U/L — ABNORMAL HIGH (ref 10–35)
Albumin: 3.5 g/dL — ABNORMAL LOW (ref 3.6–5.1)
Alkaline phosphatase (APISO): 77 U/L (ref 33–130)
BILIRUBIN DIRECT: 0.3 mg/dL — AB (ref 0.0–0.2)
BILIRUBIN INDIRECT: 1.2 mg/dL (ref 0.2–1.2)
Globulin: 3 g/dL (calc) (ref 1.9–3.7)
Total Bilirubin: 1.5 mg/dL — ABNORMAL HIGH (ref 0.2–1.2)
Total Protein: 6.5 g/dL (ref 6.1–8.1)

## 2017-05-08 LAB — MICROALBUMIN / CREATININE URINE RATIO
CREATININE, URINE: 118 mg/dL (ref 20–275)
Microalb Creat Ratio: 3 mcg/mg creat (ref <30)
Microalb, Ur: 0.4 mg/dL

## 2017-05-08 LAB — AMMONIA: AMMONIA: 72 umol/L (ref <=72)

## 2017-05-08 LAB — VITAMIN B12: VITAMIN B 12: 484 pg/mL (ref 200–1100)

## 2017-05-08 LAB — VITAMIN D 25 HYDROXY (VIT D DEFICIENCY, FRACTURES): VIT D 25 HYDROXY: 71 ng/mL (ref 30–100)

## 2017-05-08 LAB — PROTIME-INR
INR: 1.1
Prothrombin Time: 12 s — ABNORMAL HIGH (ref 9.0–11.5)

## 2017-05-08 LAB — HEMOGLOBIN A1C
Hgb A1c MFr Bld: 7.3 % of total Hgb — ABNORMAL HIGH (ref <5.7)
Mean Plasma Glucose: 163 (calc)
eAG (mmol/L): 9 (calc)

## 2017-05-08 LAB — MAGNESIUM: MAGNESIUM: 1.8 mg/dL (ref 1.5–2.5)

## 2017-05-14 ENCOUNTER — Other Ambulatory Visit: Payer: Self-pay | Admitting: Internal Medicine

## 2017-05-14 DIAGNOSIS — Z1231 Encounter for screening mammogram for malignant neoplasm of breast: Secondary | ICD-10-CM

## 2017-06-02 ENCOUNTER — Other Ambulatory Visit: Payer: Self-pay

## 2017-06-02 DIAGNOSIS — Z1211 Encounter for screening for malignant neoplasm of colon: Secondary | ICD-10-CM

## 2017-06-02 DIAGNOSIS — Z1212 Encounter for screening for malignant neoplasm of rectum: Principal | ICD-10-CM

## 2017-06-02 LAB — POC HEMOCCULT BLD/STL (HOME/3-CARD/SCREEN)
FECAL OCCULT BLD: NEGATIVE
FECAL OCCULT BLD: NEGATIVE
FECAL OCCULT BLD: NEGATIVE

## 2017-06-04 ENCOUNTER — Ambulatory Visit
Admission: RE | Admit: 2017-06-04 | Discharge: 2017-06-04 | Disposition: A | Discharge status: Home / Self Care | Payer: PPO | Source: Ambulatory Visit | Attending: Internal Medicine | Admitting: Internal Medicine

## 2017-06-04 DIAGNOSIS — Z1231 Encounter for screening mammogram for malignant neoplasm of breast: Secondary | ICD-10-CM

## 2017-06-04 HISTORY — DX: Personal history of irradiation: Z92.3

## 2017-06-07 ENCOUNTER — Other Ambulatory Visit: Payer: Self-pay | Admitting: Physician Assistant

## 2017-06-07 DIAGNOSIS — M810 Age-related osteoporosis without current pathological fracture: Secondary | ICD-10-CM

## 2017-06-11 ENCOUNTER — Ambulatory Visit (HOSPITAL_BASED_OUTPATIENT_CLINIC_OR_DEPARTMENT_OTHER)
Admission: RE | Admit: 2017-06-11 | Discharge: 2017-06-11 | Disposition: A | Discharge status: Home / Self Care | Payer: PPO | Source: Ambulatory Visit | Attending: Hematology & Oncology | Admitting: Hematology & Oncology

## 2017-06-11 ENCOUNTER — Other Ambulatory Visit: Payer: Self-pay

## 2017-06-11 ENCOUNTER — Encounter: Payer: Self-pay | Admitting: Hematology & Oncology

## 2017-06-11 ENCOUNTER — Encounter (HOSPITAL_BASED_OUTPATIENT_CLINIC_OR_DEPARTMENT_OTHER): Payer: Self-pay

## 2017-06-11 ENCOUNTER — Inpatient Hospital Stay: Payer: PPO

## 2017-06-11 ENCOUNTER — Inpatient Hospital Stay: Payer: PPO | Attending: Hematology & Oncology | Admitting: Hematology & Oncology

## 2017-06-11 ENCOUNTER — Other Ambulatory Visit (HOSPITAL_BASED_OUTPATIENT_CLINIC_OR_DEPARTMENT_OTHER): Payer: PPO

## 2017-06-11 DIAGNOSIS — Z9889 Other specified postprocedural states: Secondary | ICD-10-CM | POA: Diagnosis not present

## 2017-06-11 DIAGNOSIS — C220 Liver cell carcinoma: Secondary | ICD-10-CM | POA: Diagnosis not present

## 2017-06-11 DIAGNOSIS — Z7984 Long term (current) use of oral hypoglycemic drugs: Secondary | ICD-10-CM | POA: Diagnosis not present

## 2017-06-11 DIAGNOSIS — H538 Other visual disturbances: Secondary | ICD-10-CM | POA: Insufficient documentation

## 2017-06-11 DIAGNOSIS — Z79899 Other long term (current) drug therapy: Secondary | ICD-10-CM | POA: Diagnosis not present

## 2017-06-11 DIAGNOSIS — K7460 Unspecified cirrhosis of liver: Secondary | ICD-10-CM | POA: Diagnosis not present

## 2017-06-11 DIAGNOSIS — I81 Portal vein thrombosis: Secondary | ICD-10-CM | POA: Diagnosis not present

## 2017-06-11 DIAGNOSIS — Z853 Personal history of malignant neoplasm of breast: Secondary | ICD-10-CM | POA: Insufficient documentation

## 2017-06-11 DIAGNOSIS — K766 Portal hypertension: Secondary | ICD-10-CM | POA: Diagnosis not present

## 2017-06-11 DIAGNOSIS — K769 Liver disease, unspecified: Secondary | ICD-10-CM

## 2017-06-11 DIAGNOSIS — K7581 Nonalcoholic steatohepatitis (NASH): Secondary | ICD-10-CM | POA: Insufficient documentation

## 2017-06-11 LAB — CBC WITH DIFFERENTIAL (CANCER CENTER ONLY)
BASOS ABS: 0 10*3/uL (ref 0.0–0.1)
BASOS PCT: 0 %
Eosinophils Absolute: 0.1 10*3/uL (ref 0.0–0.5)
Eosinophils Relative: 2 %
HCT: 35.4 % (ref 34.8–46.6)
HEMOGLOBIN: 12.2 g/dL (ref 11.6–15.9)
Lymphocytes Relative: 23 %
Lymphs Abs: 0.7 10*3/uL — ABNORMAL LOW (ref 0.9–3.3)
MCH: 37.4 pg — ABNORMAL HIGH (ref 26.0–34.0)
MCHC: 34.5 g/dL (ref 32.0–36.0)
MCV: 108.6 fL — ABNORMAL HIGH (ref 81.0–101.0)
Monocytes Absolute: 0.3 10*3/uL (ref 0.1–0.9)
Monocytes Relative: 10 %
NEUTROS PCT: 65 %
Neutro Abs: 1.9 10*3/uL (ref 1.5–6.5)
Platelet Count: 83 10*3/uL — ABNORMAL LOW (ref 145–400)
RBC: 3.26 MIL/uL — AB (ref 3.70–5.32)
RDW: 13.7 % (ref 11.1–15.7)
WBC: 2.9 10*3/uL — AB (ref 3.9–10.0)

## 2017-06-11 LAB — CMP (CANCER CENTER ONLY)
ALBUMIN: 3.4 g/dL — AB (ref 3.5–5.0)
ALK PHOS: 82 U/L (ref 26–84)
ALT: 49 U/L — AB (ref 10–47)
AST: 56 U/L — AB (ref 11–38)
Anion gap: 8 (ref 5–15)
BILIRUBIN TOTAL: 1.3 mg/dL (ref 0.2–1.6)
BUN: 10 mg/dL (ref 7–22)
CALCIUM: 9.5 mg/dL (ref 8.0–10.3)
CHLORIDE: 107 mmol/L (ref 98–108)
CO2: 26 mmol/L (ref 18–33)
CREATININE: 0.9 mg/dL (ref 0.60–1.20)
Glucose, Bld: 128 mg/dL — ABNORMAL HIGH (ref 73–118)
Potassium: 4.2 mmol/L (ref 3.3–4.7)
SODIUM: 141 mmol/L (ref 128–145)
Total Protein: 6.9 g/dL (ref 6.4–8.1)

## 2017-06-11 MED ORDER — IOPAMIDOL (ISOVUE-300) INJECTION 61%
100.0000 mL | Freq: Once | INTRAVENOUS | Status: AC | PRN
  Administered 2017-06-11: 100 mL via INTRAVENOUS

## 2017-06-11 NOTE — Progress Notes (Signed)
Hematology and Oncology Follow Up Visit  Beverly Davis [id number] [birth year] 77 y.o. 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 is here today for follow-up.  We last saw her back in January.  She is doing well.  She is had no complaints.  There is no abdominal pain.  She is had no nausea or vomiting.  She is had no change in bowel or bladder habits.  She did have a CT scan done today.  Unfortunately, I do not do not have the results back as of yet.  When we last saw her, her alpha-fetoprotein level was 3.4.  She has had no bleeding.  She is had no leg swelling.  She had a mammogram done  On 06/04/2017.  This was normal.  The radiologist sent her a letter about her dense breast tissue.  I am not sure exactly what we need to do regarding this.  I am sure that her family doctor will be able to make decisions as to what type of mammogram is necessary in the future.  Overall, her performance status is ECOG 1.  Is   Medications:  Allergies as of 06/11/2017      Reactions   Metformin And Related Other (See Comments)   Welchol [colesevelam Hcl] Other (See Comments)      Medication List        Accurate as of 06/11/17 11:07 AM. Always use your most recent med list.          alendronate 70 MG tablet Commonly known as:  FOSAMAX TAKE 1 TABLET BY MOUTH ONCE A WEEK. TAKE WITH A FULL GLASS OF WATER ON AN EMPTY STOMACH   Dulaglutide 0.75 MG/0.5ML Sopn Commonly known as:  TRULICITY Inject 0.5 mLs into the skin once a week.   glipiZIDE 5 MG tablet Commonly known as:  GLUCOTROL TAKE 1 TABLET BY MOUTH THREE TIMES DAILY BEFORE MEALS   lactulose 10 GM/15ML solution Commonly known as:  CHRONULAC Take 30  mLs (20 g total) by mouth 3 (three) times daily.   levothyroxine 150 MCG tablet Commonly known as:  SYNTHROID, LEVOTHROID TAKE 1 TO 1 & 1/2  TABLETS BY MOUTH ONCE DAILY AS DIRECTED   linagliptin 5 MG Tabs tablet Commonly known as:  TRADJENTA Take 1 tablet (5 mg total) by mouth daily.   metFORMIN 500 MG 24 hr tablet Commonly known as:  GLUCOPHAGE XR Take 2 tablets 2 x / day for Diabetes   ONE TOUCH ULTRA TEST test strip Generic drug:  glucose blood CHECK BLOOD SUGAR 1 TIME DAILY   ONETOUCH DELICA LANCETS 29B Misc USE TO CHECK BLOOD SUGAR 1 TIME DAILY   sitaGLIPtin-metformin 50-1000 MG tablet Commonly known as:  JANUMET Take 1 tablet by mouth 2 (two) times daily with a meal.   triamcinolone cream 0.5 % Commonly known as:  KENALOG Apply 1 application topically 2 (two) times daily.   VITAMIN D3 PO Take 5,000 Units by mouth daily.       Allergies:  Allergies  Allergen Reactions  . Metformin And Related Other (See Comments)  . Welchol [Colesevelam Hcl] Other (See Comments)    Past Medical History, Surgical history, Social history, and Family History were reviewed and updated.  Review of Systems:  Review of Systems  Constitutional: Negative.   HENT: Negative.   Eyes: Positive for blurred vision.  Respiratory: Negative.   Cardiovascular: Negative.   Gastrointestinal: Negative.   Genitourinary: Negative.   Musculoskeletal: Negative.   Skin: Negative.   Neurological: Negative.   Endo/Heme/Allergies: Negative.   Psychiatric/Behavioral: Negative.       Physical Exam:  weight is 148 lb 1.9 oz (67.2 kg). Her oral temperature is 97.9 F (36.6 C). Her blood pressure is 111/56 (abnormal) and her pulse is 66. Her respiration is 20 and oxygen saturation is 100%.   Wt Readings from Last 3 Encounters:  06/11/17 148 lb 1.9 oz (67.2 kg)  05/07/17 145 lb 6.4 oz (66 kg)  04/23/17 147 lb 12.8 oz (67 kg)    Physical Exam  Constitutional: She is oriented to person, place,  and time.  HENT:  Head: Normocephalic and atraumatic.  Mouth/Throat: Oropharynx is clear and moist.  Eyes: EOM are normal. Pupils are equal, round, and reactive to light.  Neck: Normal range of motion.  Cardiovascular: Normal rate, regular rhythm and normal heart sounds.  Pulmonary/Chest: Effort normal and breath sounds normal.  Abdominal: Soft. Bowel sounds are normal.  Musculoskeletal: Normal range of motion. She exhibits no edema, tenderness or deformity.  Lymphadenopathy:    She has no cervical adenopathy.  Neurological: She is alert and oriented to person, place, and time.  Skin: Skin is warm and dry. No rash noted. No erythema.  Psychiatric: She has a normal mood and affect. Her behavior is normal. Judgment and thought content normal.  Vitals reviewed.   Lab Results  Component Value Date   WBC 2.9 (L) 06/11/2017   HGB 12.8 05/07/2017   HCT 35.4 06/11/2017   MCV 108.6 (H) 06/11/2017   PLT 83 (L) 06/11/2017   Lab Results  Component Value Date   FERRITIN 119 03/23/2016   IRON 36 03/23/2016   TIBC 234 (L) 03/23/2016   UIBC 198 03/23/2016   IRONPCTSAT 15 03/23/2016   Lab Results  Component Value Date   RETICCTPCT 1.2 09/05/2015   RBC 3.26 (L) 06/11/2017   RETICCTABS 40,080 09/05/2015   No results found for: KPAFRELGTCHN, LAMBDASER, KAPLAMBRATIO No results found for: Kandis Cocking, [facility] Lab Results  Component Value Date   TOTALPROTELP 7.1 02/03/2008   ALBUMINELP 54.8 (L) 02/03/2008   A1GS 4.2 02/03/2008   A2GS 9.5 02/03/2008   BETS 8.1 (H) 02/03/2008   BETA2SER 5.8 02/03/2008   GAMS 17.6 02/03/2008   MSPIKE NOT DET 02/03/2008   SPEI * 02/03/2008     Chemistry      Component Value Date/Time   NA 141 06/11/2017 0943   NA 139 01/01/2017 1034   K 4.2 06/11/2017 0943   K 4.1 01/01/2017 1034   CL 107 06/11/2017 0943   CL 107 01/01/2017 1034   CO2 26 06/11/2017 0943   CO2 29 01/01/2017 1034   BUN 10 06/11/2017 0943   BUN 9 01/01/2017 1034   CREATININE  0.90 06/11/2017 0943   CREATININE 0.74 05/07/2017 0916      Component Value Date/Time   CALCIUM 9.5 06/11/2017 0943   CALCIUM 9.4 01/01/2017 1034   ALKPHOS 82 06/11/2017 0943   ALKPHOS 81 01/01/2017 1034   AST 56 (H) 06/11/2017 0943   ALT 49 (H) 06/11/2017 0943   ALT 48 (H) 01/01/2017 1034   BILITOT 1.3 06/11/2017 0943      Impression and Plan: Ms. Bevel is a very pleasant 77 yo caucasian female with history  of hepatocellular carcinoma. She had RFA in March, 2018 and did well.  Her last alpha-fetoprotein was down to normal.  I will repeat a CT scan in August.  I would like to see how the liver looks but also I would like to see if there is any change in this portal splenic thrombus.  If this thrombus has progressed, we may have to consider anticoagulation therapy for her.  Currently, I do not see any issues with this thrombus.  She is asymptomatic with it.    [name], MD 3/20/201911:07 AM

## 2017-06-12 ENCOUNTER — Telehealth: Payer: Self-pay | Admitting: *Deleted

## 2017-06-12 LAB — AFP TUMOR MARKER: AFP, Serum, Tumor Marker: <0.7 ng/mL (ref 0.0–8.3)

## 2017-06-12 NOTE — Telephone Encounter (Addendum)
Patient is aware of results.   ----- Message from Beverly Napoleon, MD sent at 06/11/2017  3:28 PM EDT ----- Call - the liver tumor is smaller and the blood clot in your portal vein is no longer seen!!!  Great news!!!  Beverly Davis

## 2017-06-18 ENCOUNTER — Encounter: Payer: Self-pay | Admitting: Internal Medicine

## 2017-06-18 ENCOUNTER — Ambulatory Visit (INDEPENDENT_AMBULATORY_CARE_PROVIDER_SITE_OTHER): Payer: PPO | Admitting: Internal Medicine

## 2017-06-18 ENCOUNTER — Ambulatory Visit: Payer: Self-pay | Admitting: Internal Medicine

## 2017-06-18 VITALS — BP 126/62 | HR 72 | Temp 97.2°F | Resp 16 | Ht 62.0 in | Wt 150.2 lb

## 2017-06-18 DIAGNOSIS — N182 Chronic kidney disease, stage 2 (mild): Secondary | ICD-10-CM

## 2017-06-18 DIAGNOSIS — Z8505 Personal history of malignant neoplasm of liver: Secondary | ICD-10-CM

## 2017-06-18 DIAGNOSIS — E1122 Type 2 diabetes mellitus with diabetic chronic kidney disease: Secondary | ICD-10-CM

## 2017-06-18 NOTE — Progress Notes (Signed)
Subjective:    Patient ID: Beverly Davis, female    DOB: 31-Oct-1940, 77 y.o.   MRN: [id number]  HPI  Patient is a delightful  77 yo MWF with HTN, HLD, Hypothyroidism, Hx remote breast ca, Vit D deficiency and Vit D Deficiency    Patient also has NASH and is s/p recent RFA of a Hepatoma. Today she returns for f/u of poorly controlled T2_DM  (A1c 7.3%) and for financial concerns and inability to afford Trulicity & Tradjenta , she was advised to continue g Glipizide 5 mg 2 x/day and also given a 6 week sx supply of  Janumet 50/1000 2 x / day. She reports FBG's are ranging usually betw 90-120 mg%. Denies hypoglycemic rxn's or Diabetic polys, paresthesias or visual blurring.   Medication Sig  . alendronate (FOSAMAX) 70 MG tablet TAKE 1 TABLET BY MOUTH ONCE A WEEK. TAKE WITH A FULL GLASS OF WATER ON AN EMPTY STOMACH  . Cholecalciferol (VITAMIN D3 PO) Take 5,000 Units by mouth daily.  . Dulaglutide (TRULICITY) 7.16 RC/7.8LF SOPN Inject 0.5 mLs into the skin once a week. (Patient not taking: Reported on 06/18/2017)  . glipiZIDE (GLUCOTROL) 5 MG tablet TAKE 1 TABLET BY MOUTH THREE TIMES DAILY BEFORE MEALS  . lactulose (CHRONULAC) 10 GM/15ML solution Take 30 mLs (20 g total) by mouth 3 (three) times daily. (Patient taking differently: Take 20 g by mouth daily. )  . levothyroxine (SYNTHROID, LEVOTHROID) 150 MCG tablet TAKE 1 TO 1 & 1/2  TABLETS BY MOUTH ONCE DAILY AS DIRECTED (Patient taking differently: 1/2 tab daily)  . linagliptin (TRADJENTA) 5 MG TABS tablet Take 1 tablet (5 mg total) by mouth daily. (Patient not taking: Reported on 06/18/2017)  . metFORMIN (GLUCOPHAGE XR) 500 MG 24 hr tablet Take 2 tablets 2 x / day for Diabetes  . ONE TOUCH ULTRA TEST test strip CHECK BLOOD SUGAR 1 TIME DAILY  . ONETOUCH DELICA LANCETS 81O MISC USE TO CHECK BLOOD SUGAR 1 TIME DAILY  . sitaGLIPtin-metformin (JANUMET) 50-1000 MG tablet Take 1 tablet by mouth 2 (two) times daily with a meal.  . triamcinolone cream  (KENALOG) 0.5 % Apply 1 application topically 2 (two) times daily.   Allergies  Allergen Reactions  . Metformin And Related Other (See Comments)  . Welchol [Colesevelam Hcl] Other (See Comments)   Past Medical History:  Diagnosis Date  . Breast cancer ([place]) 05/2006   Right breast cancer  . Cirrhosis ([place])   . Diabetes mellitus without complication ([place])    type 2  . Fibroids   . Hepatocellular carcinoma ([place]) 05/08/2016  . Hypothyroidism   . Osteopenia   . Personal history of radiation therapy 2008  . Thrombocytopenia ([place])   . Thyroid disease    Past Surgical History:  Procedure Laterality Date  . ABDOMINAL HYSTERECTOMY     TAH BSO  . BREAST LUMPECTOMY Right 2008  . BREAST LUMPECTOMY WITH AXILLARY LYMPH NODE BIOPSY Right 06/25/2006  . CHOLECYSTECTOMY    . IR GENERIC HISTORICAL  03/28/2016   IR RADIOLOGIST EVAL & MGMT 03/28/2016 Corrie Mckusick, DO [facility]  . IR GENERIC HISTORICAL  05/01/2016   IR ANGIOGRAM VISCERAL SELECTIVE 05/01/2016 Corrie Mckusick, DO [facility]  . IR GENERIC HISTORICAL  05/01/2016   IR ANGIOGRAM VISCERAL SELECTIVE 05/01/2016 Corrie Mckusick, DO [facility]  . IR GENERIC HISTORICAL  05/01/2016   IR ANGIOGRAM SELECTIVE EACH ADDITIONAL VESSEL 05/01/2016 Corrie Mckusick, DO [facility]  . IR GENERIC HISTORICAL  05/01/2016  IR ANGIOGRAM SELECTIVE EACH ADDITIONAL VESSEL 05/01/2016 Corrie Mckusick, DO [facility]  . IR GENERIC HISTORICAL  05/01/2016   IR ANGIOGRAM SELECTIVE EACH ADDITIONAL VESSEL 05/01/2016 Corrie Mckusick, DO [facility]  . IR GENERIC HISTORICAL  05/01/2016   IR EMBO TUMOR ORGAN ISCHEMIA INFARCT INC GUIDE ROADMAPPING 05/01/2016 Corrie Mckusick, DO [facility]  . IR GENERIC HISTORICAL  05/01/2016   IR CT SPINE LTD 05/01/2016 Corrie Mckusick, DO [facility]  . IR GENERIC HISTORICAL  05/01/2016   IR US GUIDE VASC ACCESS RIGHT 05/01/2016 Corrie Mckusick, DO [facility]  . IR GENERIC HISTORICAL  05/01/2016   IR ANGIOGRAM SELECTIVE EACH ADDITIONAL VESSEL 05/01/2016 Corrie Mckusick, DO  [facility]  . IR RADIOLOGIST EVAL & MGMT  07/31/2016  . IR RADIOLOGIST EVAL & MGMT  12/25/2016  . RADIOFREQUENCY ABLATION N/A 06/14/2016   Procedure: LIVER MICROWAVE THERMAL  ABLATION;  Surgeon: Corrie Mckusick, DO;  Location: [facility];  Service: Anesthesiology;  Laterality: N/A;  . TUBAL LIGATION  1978   Review of Systems  10 point systems review negative except as above.    Objective:   Physical Exam  BP 126/62   Pulse 72   Temp (!) 97.2 F (36.2 C)   Resp 16   Ht 5' 2"  (1.575 m)   Wt 150 lb 3.2 oz (68.1 kg)   BMI 27.47 kg/m   HEENT - WNL. Neck - supple.  Chest - Clear equal BS. Cor - Nl HS. RRR w/o sig MGR. PP 1(+). No edema. MS- FROM w/o deformities.  Gait Nl. Neuro -  Nl w/o focal abnormalities.    Assessment & Plan:   1. Type 2 diabetes mellitus with stage 2 chronic kidney disease, without long-term current use of insulin ([place])  - Encouraged to continue prudent diet.  - Given our only avail sx's of 2 mo supply of Janumet 100/1000 XR to take 1 tab qam and - 2 mo supply of  Janumet 50/1000 to take 1 tab every evening  - til she returns in ~ 6 weeks for a 3 mo f/u and she may be able to transition off of the Janumet to Metformin alone.  Over 20 minutes of exam, counseling, chart review and  complex critical decision making was performed

## 2017-06-18 NOTE — Patient Instructions (Signed)
  When finish up the Janumet 50 / 1000   Taking 2 x day  Then start   Janumet XR  100/1000 - each morning (Breakfast)  &  Janumet 50/1000 - each evening (supper)

## 2017-07-16 DIAGNOSIS — C229 Malignant neoplasm of liver, not specified as primary or secondary: Secondary | ICD-10-CM | POA: Diagnosis not present

## 2017-07-16 DIAGNOSIS — K7460 Unspecified cirrhosis of liver: Secondary | ICD-10-CM | POA: Diagnosis not present

## 2017-07-29 ENCOUNTER — Other Ambulatory Visit: Payer: Self-pay | Admitting: Internal Medicine

## 2017-08-04 NOTE — Progress Notes (Signed)
MEDICARE ANNUAL WELLNESS VISIT AND FOLLOW UP  Assessment:   Diagnoses and all orders for this visit:  Encounter for Medicare annual wellness exam  Essential hypertension Continue medications Monitor blood pressure at home; call if consistently over 130/80 Continue DASH diet.   Reminder to go to the ER if any CP, SOB, nausea, dizziness, severe HA, changes vision/speech, left arm numbness and tingling and jaw pain.  Hepatocellular carcinoma ([place]) Followed by Dr. Marin Olp and Dr. Benson Norway  Type 2 diabetes mellitus with stage 2 chronic kidney disease, without long-term current use of insulin [facility]) Education: Reviewed 'ABCs' of diabetes management (respective goals in parentheses):  A1C (<7), blood pressure (<130/80), and cholesterol (LDL <70) Eye Exam yearly and Dental Exam every 6 months. Dietary recommendations Physical Activity recommendations Just had foot exam   Hypothyroidism, unspecified type continue medications the same pending lab results reminded to take on an empty stomach 30-20mns before food.  check TSH level  Osteoporosis, unspecified osteoporosis type, unspecified pathological fracture presence Dexa due -ordered today Discussed due to stop foxamax; she believes this was started by oncologist and wants to check with him prior to stopping.   Hepatic Encephalopathy ([place] Cannot afford xifaxan prescribed by Dr. HBenson Norway per Dr. MMelford Aaserecommendation discussed alternately trying rifampin 300 mg BID, which should be much more affordable for her.  Continue with lactulose in the interim, but may be able to further reduce this as well to reduce SE/leakage.   Pancytopenia (HCC) CBC, followed by Dr. EMarin Olp Vitamin D deficiency At goal at recent check; continue to recommend supplementation for goal of 70-100 Defer vitamin D level  Medication management CBC, CMP/GFR  Hyperlipidemia, unspecified hyperlipidemia type Not on meds secondary to liver disease Continue low  cholesterol diet and exercise.  Check lipid panel.   History of breast cancer S/p lumpectomy, continue with annual mammograms Follows with Dr. EMarin Olp Over 40 minutes of exam, counseling, chart review and critical decision making was performed Future Appointments  Date Time Provider DChauncey 11/11/2017  9:00 AM [facility] LAB [facility] None  11/11/2017  9:30 AM [facility] 1 [facility]  11/11/2017 10:45 AM [place], SHolli Humbles NP [facility] None  11/19/2017  2:30 PM MUnk Pinto MD [facility] None  05/22/2018  9:00 AM MUnk Pinto MD [facility] None     Plan:   During the course of the visit the patient was educated and counseled about appropriate screening and preventive services including:    Pneumococcal vaccine   Prevnar 13  Influenza vaccine  Td vaccine  Screening electrocardiogram  Bone densitometry screening  Colorectal cancer screening  Diabetes screening  Glaucoma screening  Nutrition counseling   Advanced directives: requested   Subjective:  Beverly ALVISis a 77y.o. female who presents for Medicare Annual Wellness Visit and 3 month follow up. Patient has remote hx/o Rt Breast cancer in 2008. Patient is followed by Dr EMarin Olp Patient does have hx/o NASH and cirrhosis and prior hospitalization w/ Hepatic Encephalopathy. In Nov 2017 [facility], she had Radio-embolization of a Rt Hepatic Lobe HCC, then followed in March 2018 with a thermal microwave ablation. 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 Most recent follow up CT in 05/2017 demonstrates stable from previous.   She presents with a sample of medication - xifaxan - given to her by Dr. PCarol Ada which she is concerned  about as she cannot afford $700+ dollar copay.   BMI is Body mass index is 27.07 kg/m., she has not been working on diet and exercise. Wt  Readings from Last 3 Encounters:  08/06/17 148 lb (67.1 kg)  06/18/17 150 lb 3.2 oz (68.1 kg)  06/11/17 148 lb 1.9 oz (67.2 kg)   Today their BP is BP: 94/62 She does not workout. She denies chest pain, shortness of breath, dizziness.   She is not on cholesterol medication due to her liver disease. Her cholesterol is not at goal. The cholesterol last visit was:   Lab Results  Component Value Date   CHOL 209 (H) 05/07/2017   HDL 80 05/07/2017   LDLCALC 112 (H) 05/07/2017   TRIG 76 05/07/2017   CHOLHDL 2.6 05/07/2017   . She has been working on diet for T2 diabetes, and denies foot ulcerations, increased appetite, nausea, paresthesia of the feet, polydipsia, polyuria, visual disturbances, vomiting and weight loss. She does check fasting blood sugars and presents with log today; ranges 92-160, average is 110-120. Last A1C in the office was:  Lab Results  Component Value Date   HGBA1C 7.3 (H) 05/07/2017   Last GFR: Lab Results  Component Value Date   GFRNONAA 79 05/07/2017   Patient is on Vitamin D supplement.   Lab Results  Component Value Date   VD25OH 71 05/07/2017      Medication Review: Current Outpatient Medications on File Prior to Visit  Medication Sig Dispense Refill  . alendronate (FOSAMAX) 70 MG tablet TAKE 1 TABLET BY MOUTH ONCE A WEEK. TAKE WITH A FULL GLASS OF WATER ON AN EMPTY STOMACH 12 tablet 0  . Cholecalciferol (VITAMIN D3 PO) Take 5,000 Units by mouth daily.    Marland Kitchen glipiZIDE (GLUCOTROL) 5 MG tablet TAKE 1 TABLET BY MOUTH THREE TIMES DAILY BEFORE MEAL(S) 270 tablet 1  . lactulose (CHRONULAC) 10 GM/15ML solution Take 30 mLs (20 g total) by mouth 3 (three) times daily. (Patient taking differently: Take 30 g by mouth daily. ) 240 mL 3  . levothyroxine (SYNTHROID, LEVOTHROID) 150 MCG tablet TAKE 1 TO 1 & 1/2  TABLETS BY MOUTH ONCE DAILY AS DIRECTED (Patient taking differently: 1/2 tab daily) 135 tablet 0  . ONE TOUCH ULTRA TEST test strip CHECK BLOOD SUGAR 1 TIME  DAILY 75 each 9  . ONETOUCH DELICA LANCETS 83T MISC USE TO CHECK BLOOD SUGAR 1 TIME DAILY 100 each 6  . sitaGLIPtin-metformin (JANUMET) 50-1000 MG tablet Take 1 tablet by mouth 2 (two) times daily with a meal.    . triamcinolone cream (KENALOG) 0.5 % Apply 1 application topically 2 (two) times daily. 80 g 2  . Dulaglutide (TRULICITY) 5.17 OH/6.0VP SOPN Inject 0.5 mLs into the skin once a week. (Patient not taking: Reported on 08/06/2017) 1 pen 0  . linagliptin (TRADJENTA) 5 MG TABS tablet Take 1 tablet (5 mg total) by mouth daily. (Patient not taking: Reported on 08/06/2017) 30 tablet 3  . metFORMIN (GLUCOPHAGE XR) 500 MG 24 hr tablet Take 2 tablets 2 x / day for Diabetes (Patient not taking: Reported on 08/06/2017) 360 tablet 1   No current facility-administered medications on file prior to visit.     Allergies  Allergen Reactions  . Metformin And Related Other (See Comments)  . Welchol [Colesevelam Hcl] Other (See Comments)    Current Problems (verified) Patient Active Problem List   Diagnosis Date Noted  . Hepatocellular carcinoma ([place]) 05/08/2016  . Pancytopenia ([place])   .  Hepatic encephalopathy ([place]) 03/18/2016  . Hyperlipemia 12/14/2014  . Hypothyroidism 05/18/2014  . Essential hypertension 05/18/2014  . Vitamin D deficiency 05/18/2014  . Medication management 05/18/2014  . 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 Osteoporosis   . History of breast cancer 09/30/2012    Screening Tests Immunization History  Administered Date(s) Administered  . Influenza Split 03/06/2011, 01/06/2013  . Influenza, High Dose Seasonal PF 01/12/2014, 12/14/2014, 12/27/2015, 01/22/2017  . Pneumococcal Conjugate-13 01/12/2014  . Pneumococcal Polysaccharide-23 08/30/2014  . Tdap 06/10/2012   Preventative care: Last colonoscopy: 2006, 2016 Benson Norway) Mammogram:  05/2017 [id number], OVERDUE, on fosamax  Prior vaccinations: TD or Tdap: 2014  Influenza: 2018  Pneumococcal: 2015 Prevnar13:  2016 Shingles/Zostavax: Declines  Names of Other Physician/Practitioners you currently use: 1. [facility] here for primary care 2. Dr. Prudencio Burly, eye doctor, last visit - 2017 - has scheduled for 08/23/2017 3. Dr. Belva Agee, dentist, last visit 2018    Patient Care Team: Unk Pinto, MD as PCP - General (Internal Medicine) Carol Ada, MD as Consulting Physician (Gastroenterology) Inda Castle, MD (Inactive) as Consulting Physician (Gastroenterology) [name], MD as Consulting Physician (Oncology)  SURGICAL HISTORY She  has a past surgical history that includes Breast lumpectomy with axillary lymph node biopsy (Right, 06/25/2006); Cholecystectomy; Abdominal hysterectomy; Tubal ligation (1978); ir generic historical (03/28/2016); ir generic historical (05/01/2016); ir generic historical (05/01/2016); ir generic historical (05/01/2016); ir generic historical (05/01/2016); ir generic historical (05/01/2016); ir generic historical (05/01/2016); ir generic historical (05/01/2016); ir generic historical (05/01/2016); ir generic historical (05/01/2016); Radiofrequency ablation (N/A, 06/14/2016); IR Radiologist Eval & Mgmt (07/31/2016); IR Radiologist Eval & Mgmt (12/25/2016); and Breast lumpectomy (Right, 2008). FAMILY HISTORY Her family history includes Cancer in her father, mother, and paternal grandfather; Diabetes in her maternal aunt, sister, and sister; Heart Problems in her maternal grandfather; Hypertension in her sister; Liver disease in her brother. SOCIAL HISTORY She  reports that she has never smoked. She has never used smokeless tobacco. She reports that she does not drink alcohol or use drugs.   MEDICARE WELLNESS OBJECTIVES: Physical activity: Current Exercise Habits: The patient does not participate in regular exercise at present(Watches her young grandchildren), Exercise limited by: None identified Cardiac risk factors: Cardiac Risk Factors include: advanced  age (>60mn, >>9women);diabetes mellitus;dyslipidemia;hypertension Depression/mood screen:   [facility] 08/06/2017  Decreased Interest 0  Down, Depressed, Hopeless 0  PHQ - 2 Score 0    ADLs:  In your present state of health, do you have any difficulty performing the following activities: 08/06/2017 05/07/2017  Hearing? N N  Vision? N N  Difficulty concentrating or making decisions? N N  Walking or climbing stairs? N N  Dressing or bathing? N N  Doing errands, shopping? N N  Preparing Food and eating ? N -  Using the Toilet? N -  In the past six months, have you accidently leaked urine? N -  Do you have problems with loss of bowel control? Y -  Comment R/t lactulose, wears liners or pullups to go out -  Managing your Medications? N -  Managing your Finances? N -  Housekeeping or managing your Housekeeping? N -  Some recent data might be hidden     Cognitive Testing  Alert? Yes  Normal Appearance?Yes  Oriented to person? Yes  Place? Yes   Time? Yes  Recall of three objects?  Yes  Can perform simple calculations? Yes  Displays appropriate judgment?Yes  Can read the correct time from  a watch face?Yes  EOL planning: Does Patient Have a Medical Advance Directive?: No Would patient like information on creating a medical advance directive?: No - Patient declined  Review of Systems  Constitutional: Negative for malaise/fatigue and weight loss.  HENT: Negative for hearing loss and tinnitus.   Eyes: Negative for blurred vision and double vision.  Respiratory: Negative for cough, sputum production, shortness of breath and wheezing.   Cardiovascular: Negative for chest pain, palpitations, orthopnea, claudication, leg swelling and PND.  Gastrointestinal: Negative for abdominal pain, blood in stool, constipation, diarrhea, heartburn, melena, nausea and vomiting.  Genitourinary: Negative.   Musculoskeletal: Negative for falls, joint pain and myalgias.  Skin: Negative for  rash.  Neurological: Negative for dizziness, tingling, sensory change, weakness and headaches.  Endo/Heme/Allergies: Negative for polydipsia.  Psychiatric/Behavioral: Negative.  Negative for depression, memory loss, substance abuse and suicidal ideas. The patient is not nervous/anxious and does not have insomnia.   All other systems reviewed and are negative.    Objective:     Today's Vitals   08/06/17 1132  BP: 94/62  Pulse: (!) 57  Temp: (!) 97.3 F (36.3 C)  SpO2: 99%  Weight: 148 lb (67.1 kg)  Height: 5' 2"  (1.575 m)   Body mass index is 27.07 kg/m.  General appearance: alert, no distress, WD/WN, female HEENT: normocephalic, sclerae anicteric, TMs pearly, nares patent, no discharge or erythema, pharynx normal Oral cavity: MMM, no lesions Neck: supple, no lymphadenopathy, no thyromegaly, no masses Heart: RRR, normal S1, S2, no murmurs Lungs: CTA bilaterally, no wheezes, rhonchi, or rales Abdomen: +bs, soft, non tender, non distended, no masses, no hepatomegaly, no splenomegaly Musculoskeletal: nontender, no swelling, no obvious deformity Extremities: no edema, no cyanosis, no clubbing Pulses: 2+ symmetric, upper and lower extremities, normal cap refill Neurological: alert, oriented x 3, CN2-12 intact, strength normal upper extremities and lower extremities, sensation normal throughout, DTRs 2+ throughout, no cerebellar signs, gait normal Psychiatric: normal affect, behavior normal, pleasant   Medicare Attestation I have personally reviewed: The patient's medical and social history Their use of alcohol, tobacco or illicit drugs Their current medications and supplements The patient's functional ability including ADLs,fall risks, home safety risks, cognitive, and hearing and visual impairment Diet and physical activities Evidence for depression or mood disorders  The patient's weight, height, BMI, and visual acuity have been recorded in the chart.  I have made referrals,  counseling, and provided education to the patient based on review of the above and I have provided the patient with a written personalized care plan for preventive services.     Izora Ribas, NP   08/06/2017

## 2017-08-06 ENCOUNTER — Encounter: Payer: Self-pay | Admitting: Adult Health

## 2017-08-06 ENCOUNTER — Ambulatory Visit (INDEPENDENT_AMBULATORY_CARE_PROVIDER_SITE_OTHER): Payer: PPO | Admitting: Adult Health

## 2017-08-06 VITALS — BP 94/62 | HR 57 | Temp 97.3°F | Ht 62.0 in | Wt 148.0 lb

## 2017-08-06 DIAGNOSIS — Z853 Personal history of malignant neoplasm of breast: Secondary | ICD-10-CM | POA: Diagnosis not present

## 2017-08-06 DIAGNOSIS — E559 Vitamin D deficiency, unspecified: Secondary | ICD-10-CM | POA: Diagnosis not present

## 2017-08-06 DIAGNOSIS — C220 Liver cell carcinoma: Secondary | ICD-10-CM

## 2017-08-06 DIAGNOSIS — Z0000 Encounter for general adult medical examination without abnormal findings: Secondary | ICD-10-CM

## 2017-08-06 DIAGNOSIS — I10 Essential (primary) hypertension: Secondary | ICD-10-CM | POA: Diagnosis not present

## 2017-08-06 DIAGNOSIS — M810 Age-related osteoporosis without current pathological fracture: Secondary | ICD-10-CM

## 2017-08-06 DIAGNOSIS — D61818 Other pancytopenia: Secondary | ICD-10-CM

## 2017-08-06 DIAGNOSIS — Z0001 Encounter for general adult medical examination with abnormal findings: Secondary | ICD-10-CM | POA: Diagnosis not present

## 2017-08-06 DIAGNOSIS — K7290 Hepatic failure, unspecified without coma: Secondary | ICD-10-CM | POA: Diagnosis not present

## 2017-08-06 DIAGNOSIS — E1122 Type 2 diabetes mellitus with diabetic chronic kidney disease: Secondary | ICD-10-CM

## 2017-08-06 DIAGNOSIS — Z79899 Other long term (current) drug therapy: Secondary | ICD-10-CM

## 2017-08-06 DIAGNOSIS — N182 Chronic kidney disease, stage 2 (mild): Secondary | ICD-10-CM | POA: Diagnosis not present

## 2017-08-06 DIAGNOSIS — R6889 Other general symptoms and signs: Secondary | ICD-10-CM

## 2017-08-06 DIAGNOSIS — E785 Hyperlipidemia, unspecified: Secondary | ICD-10-CM

## 2017-08-06 DIAGNOSIS — K7682 Hepatic encephalopathy: Secondary | ICD-10-CM

## 2017-08-06 DIAGNOSIS — E039 Hypothyroidism, unspecified: Secondary | ICD-10-CM | POA: Diagnosis not present

## 2017-08-06 MED ORDER — RIFAMPIN 300 MG PO CAPS: 300.0000 mg | ORAL_CAPSULE | Freq: Two times a day (BID) | ORAL | 3 refills | Status: DC

## 2017-08-06 NOTE — Patient Instructions (Signed)
Try the triamcinolone (kenalog) cream you have at home from last hospitalization on your neck rash.   Call Dr. Benson Norway to let him know that the xifaxan was too expensive, but that you discussed this with your primary care doctor and that rifampin works similarly and is much [facility] cheaper. We will go ahead and prescribe this for you today.    Call your oncology doctor and let him know we are ordering a bone density scan, but are concerned that you need to take a break from fosamax (alexandronate) medication - is there any reason why he wants [place] to avoid doing this?    Aim for 7+ servings of fruits and vegetables daily  80+ fluid ounces of water or unsweet tea for healthy kidneys  Limit animal fats in diet for cholesterol and heart health - choose grass fed whenever available  Aim for low stress - take time to unwind and care for your mental health  Aim for 150 min of moderate intensity exercise weekly for heart health, and weights twice weekly for bone health  Aim for 7-9 hours of sleep daily      When it comes to diets, agreement about the perfect plan isn't easy to find, even among the experts. Experts at the [employer] developed an idea known as the Healthy Eating Plate. Just imagine a plate divided into logical, healthy portions.  The emphasis is on diet quality:  Load up on vegetables and fruits - one-half of your plate: Aim for color and variety, and remember that potatoes don't count.  Go for whole grains - one-quarter of your plate: Whole wheat, barley, wheat berries, quinoa, oats, brown rice, and foods made with them. If you want pasta, go with whole wheat pasta.  Protein power - one-quarter of your plate: Fish, chicken, beans, and nuts are all healthy, versatile protein sources. Limit red meat.  The diet, however, does go beyond the plate, offering a few other suggestions.  Use healthy plant oils, such as olive, canola, soy, corn, sunflower and  peanut. Check the labels, and avoid partially hydrogenated oil, which have unhealthy trans fats.  If you're thirsty, drink water. Coffee and tea are good in moderation, but skip sugary drinks and limit milk and dairy products to one or two daily servings.  The type of carbohydrate in the diet is more important than the amount. Some sources of carbohydrates, such as vegetables, fruits, whole grains, and beans-are healthier than others.  Finally, stay active.   Rifampin capsules What is this medicine? RIFAMPIN (RIF am pin) is an antibiotic. It is used to treat or prevent certain kinds of bacterial infections. It is used to treat or prevent tuberculosis (TB). It will not work for colds, flu, or other viral infections. This medicine may be used for other purposes; ask your health care provider or pharmacist if you have questions. COMMON BRAND NAME(S): Rifadin, Rimactane What should I tell my health care provider before I take this medicine? They need to know if you have any of these conditions: -diabetes -HIV or AIDS -if you often drink alcohol -liver disease -wear contact lenses -an unusual or allergic reaction to rifampin, rifabutin, other medicines, foods, dyes or preservatives -pregnant or trying to get pregnant -breast-feeding How should I use this medicine? Take this medicine by mouth with a glass of water. Follow the directions on the prescription label. Take this medicine on an empty stomach, either 1 hour before or 2 hours after food. Take  your doses at regular intervals. Do not take your medicine more often than directed. For your therapy to work as well as possible, take each dose exactly as prescribed. Do not skip doses or stop your medicine even if you feel better. Skipping doses may make the TB resistant to this medicine and other medicines. Do not stop taking except on your doctor's advice. Contact your pediatrician or health care professional regarding the use of this medicine  in children. Special care may be needed. Overdosage: If you think you have taken too much of this medicine contact a poison control center or emergency room at once. NOTE: This medicine is only for you. Do not share this medicine with others. What if I miss a dose? If you miss a dose, take it as soon as you can. If it is almost time for your next dose, take only that dose. Do not take double or extra doses. What may interact with this medicine? Do not take this medicine with any of the following medications: -delavirdine -nevirapine -sirolimus -voriconazole This medicine may also interact with the following medications: -antibiotics like ciprofloxacin, clarithromycin, isoniazid -antifungal medicines like fluconazole, itraconazole, ketoconazole -atovaquone -chloramphenicol -cyclosporine -dapsone -female hormones, including contraceptive or birth control pills -halothane -medicines for blood pressure, other heart problems -medicines for depression, anxiety, or psychotic disturbances -medicines for diabetes -medicines for pain -medicnes for seizures like carbamazepine, phenobarbital, phenytoin -medicines for sleep -medicines for the thyroid -medicines that treat or prevent blood clots like warfarin -probenecid -steroid medicines like prednisone or cortisone -vitamin D This list may not describe all possible interactions. Give your health care provider a list of all the medicines, herbs, non-prescription drugs, or dietary supplements you use. Also tell them if you smoke, drink alcohol, or use illegal drugs. Some items may interact with your medicine. What should I watch for while using this medicine? Tell your doctor or healthcare professional if your symptoms do not start to get better or if they get worse. Do not treat diarrhea with over the counter products. Contact your doctor if you have diarrhea that lasts more than 2 days or if it is severe and watery. This medicine can color  your teeth, urine, sweat, tears, and mucous. The color may stain your teeth for good. The color in tears may also stain soft contact lenses for good. If you wear contact lenses, ask your doctor or health care professional when you can use your lenses again. You may need blood work done while you are taking this medicine. Birth control pills may not work properly while you are taking this medicine. Talk to your doctor about using an extra method of birth control. What side effects may I notice from receiving this medicine? Side effects that you should report to your doctor or health care professional as soon as possible: -allergic reactions like skin rash, itching or hives, swelling of the face, lips, or tongue -bloody or watery diarrhea -breathing problems -bruising, pinpoint red spots on the skin, black, tarry stools, blood in the urine -feeling faint or lightheaded, falls -fever or chills -fever with rash, swollen lymph nodes, or swelling of the face -general ill feeling or flu-like symptoms -mouth sores -redness, blistering, peeling or loosening of the skin, including inside the mouth -sore throat -stomach pain -trouble passing urine or change in the amount of urine -unusual bleeding, bruising -unusually weak or tired -yellowing of the eyes or skin Side effects that usually do not require medical attention (report to your doctor or health  care professional if they continue or are bothersome): -diarrhea -dizziness -drowsiness -confusion -headache -loss of appetite -nausea, vomiting This list may not describe all possible side effects. Call your doctor for medical advice about side effects. You may report side effects to FDA at 1-800-FDA-1088. Where should I keep my medicine? Keep out of the reach of children. Store at room temperature below 30 degrees C (86 degrees F). Protect from light and moisture. Keep container tightly closed. Throw away any unused medicine after the expiration  date. NOTE: This sheet is a summary. It may not cover all possible information. If you have questions about this medicine, talk to your doctor, pharmacist, or health care provider.  2018 Elsevier/Gold Standard (2016-04-03 14:14:10)

## 2017-08-07 LAB — CBC WITH DIFFERENTIAL/PLATELET
BASOS ABS: 21 {cells}/uL (ref 0–200)
BASOS PCT: 0.8 %
EOS ABS: 70 {cells}/uL (ref 15–500)
Eosinophils Relative: 2.7 %
HEMATOCRIT: 32.5 % — AB (ref 35.0–45.0)
Hemoglobin: 11.5 g/dL — ABNORMAL LOW (ref 11.7–15.5)
Lymphs Abs: 679 cells/uL — ABNORMAL LOW (ref 850–3900)
MCH: 37.6 pg — AB (ref 27.0–33.0)
MCHC: 35.4 g/dL (ref 32.0–36.0)
MCV: 106.2 fL — ABNORMAL HIGH (ref 80.0–100.0)
MONOS PCT: 11.4 %
MPV: 11.2 fL (ref 7.5–12.5)
NEUTROS ABS: 1534 {cells}/uL (ref 1500–7800)
Neutrophils Relative %: 59 %
PLATELETS: 83 10*3/uL — AB (ref 140–400)
RBC: 3.06 10*6/uL — ABNORMAL LOW (ref 3.80–5.10)
RDW: 13.2 % (ref 11.0–15.0)
TOTAL LYMPHOCYTE: 26.1 %
WBC: 2.6 10*3/uL — ABNORMAL LOW (ref 3.8–10.8)
WBCMIX: 296 {cells}/uL (ref 200–950)

## 2017-08-07 LAB — COMPLETE METABOLIC PANEL WITH GFR
AG RATIO: 1.4 (calc) (ref 1.0–2.5)
ALT: 36 U/L — AB (ref 6–29)
AST: 49 U/L — ABNORMAL HIGH (ref 10–35)
Albumin: 3.7 g/dL (ref 3.6–5.1)
Alkaline phosphatase (APISO): 72 U/L (ref 33–130)
BILIRUBIN TOTAL: 1.4 mg/dL — AB (ref 0.2–1.2)
BUN: 12 mg/dL (ref 7–25)
CALCIUM: 9.3 mg/dL (ref 8.6–10.4)
CHLORIDE: 107 mmol/L (ref 98–110)
CO2: 25 mmol/L (ref 20–32)
Creat: 0.71 mg/dL (ref 0.60–0.93)
GFR, EST AFRICAN AMERICAN: 97 mL/min/{1.73_m2} (ref >=60)
GFR, EST NON AFRICAN AMERICAN: 83 mL/min/{1.73_m2} (ref >=60)
Globulin: 2.7 g/dL (calc) (ref 1.9–3.7)
Glucose, Bld: 111 mg/dL — ABNORMAL HIGH (ref 65–99)
POTASSIUM: 4.6 mmol/L (ref 3.5–5.3)
Sodium: 139 mmol/L (ref 135–146)
TOTAL PROTEIN: 6.4 g/dL (ref 6.1–8.1)

## 2017-08-07 LAB — HEMOGLOBIN A1C
EAG (MMOL/L): 7.3 (calc)
HEMOGLOBIN A1C: 6.2 %{Hb} — AB (ref <5.7)
MEAN PLASMA GLUCOSE: 131 (calc)

## 2017-08-07 LAB — LIPID PANEL
Cholesterol: 190 mg/dL (ref <200)
HDL: 68 mg/dL (ref >50)
LDL Cholesterol (Calc): 99 mg/dL (calc)
NON-HDL CHOLESTEROL (CALC): 122 mg/dL (ref <130)
Total CHOL/HDL Ratio: 2.8 (calc) (ref <5.0)
Triglycerides: 133 mg/dL (ref <150)

## 2017-08-07 LAB — TSH: TSH: 2.19 m[IU]/L (ref 0.40–4.50)

## 2017-08-11 ENCOUNTER — Telehealth: Payer: Self-pay

## 2017-08-11 ENCOUNTER — Telehealth: Payer: Self-pay | Admitting: Internal Medicine

## 2017-08-11 NOTE — Telephone Encounter (Signed)
Patient called to advise onocology states Dr Melford Aase rx'd Fosomax. She states she told you  oncology rx'd. Did you want her to d/c?

## 2017-08-11 NOTE — Telephone Encounter (Signed)
Left message to call back  

## 2017-08-11 NOTE — Telephone Encounter (Signed)
Patient would really like you to call Dr. Benson Norway in regard to her Rifampin and it being discontinued by him.

## 2017-08-13 ENCOUNTER — Telehealth: Payer: Self-pay | Admitting: *Deleted

## 2017-08-13 NOTE — Telephone Encounter (Signed)
Patient has questions about her FOSAMAX prescription and management of dosing going forward. Reviewed her medication list and informed patient that Dr Marin Olp did not prescribe this medication and that she needs to follow up with that physician's office. I did review the medication with Dr Marin Olp. Dr Marin Olp is deferring management to Dr Melford Aase, however he is okay with patient continuing treatment if indicated.

## 2017-08-27 LAB — HM DIABETES EYE EXAM

## 2017-09-11 ENCOUNTER — Encounter: Payer: Self-pay | Admitting: *Deleted

## 2017-09-24 ENCOUNTER — Ambulatory Visit
Admission: RE | Admit: 2017-09-24 | Discharge: 2017-09-24 | Disposition: A | Discharge status: Home / Self Care | Payer: PPO | Source: Ambulatory Visit | Attending: Adult Health | Admitting: Adult Health

## 2017-09-24 DIAGNOSIS — Z780 Asymptomatic menopausal state: Secondary | ICD-10-CM | POA: Diagnosis not present

## 2017-09-24 DIAGNOSIS — M810 Age-related osteoporosis without current pathological fracture: Secondary | ICD-10-CM

## 2017-09-24 DIAGNOSIS — M8589 Other specified disorders of bone density and structure, multiple sites: Secondary | ICD-10-CM | POA: Diagnosis not present

## 2017-10-16 ENCOUNTER — Other Ambulatory Visit: Payer: Self-pay | Admitting: Interventional Radiology

## 2017-10-16 DIAGNOSIS — C220 Liver cell carcinoma: Secondary | ICD-10-CM

## 2017-10-17 ENCOUNTER — Telehealth: Payer: Self-pay

## 2017-10-17 NOTE — Telephone Encounter (Signed)
Patient requesting Janumet XR 100/1000mg tablets and Janumet 50/1000mg tablets. Per provider, max dosage is 100/1000mg. Patient currently takes 50/1000mg tablet, twice daily. Samples up at front desk for pick up. Patient notified.

## 2017-10-21 DIAGNOSIS — H25012 Cortical age-related cataract, left eye: Secondary | ICD-10-CM | POA: Diagnosis not present

## 2017-10-21 DIAGNOSIS — H2512 Age-related nuclear cataract, left eye: Secondary | ICD-10-CM | POA: Diagnosis not present

## 2017-10-21 DIAGNOSIS — H25812 Combined forms of age-related cataract, left eye: Secondary | ICD-10-CM | POA: Diagnosis not present

## 2017-11-10 ENCOUNTER — Other Ambulatory Visit: Payer: Self-pay | Admitting: Family

## 2017-11-10 ENCOUNTER — Telehealth: Payer: Self-pay | Admitting: *Deleted

## 2017-11-10 DIAGNOSIS — C220 Liver cell carcinoma: Secondary | ICD-10-CM

## 2017-11-10 NOTE — Telephone Encounter (Signed)
Call received from patient questioning what call she received from this office on Friday. Pt instructed that call was most likely in regards to her appt tomorrow.  Pt.'s appt times reviewed with patient.  Pt instructed to pick up bottles of contrast in radiology tomorrow morning which she needs to begin drinking by 0730 and to remain NPO after midnight on Monday night.  Teach back done.  Pt is unable to pick up contrast today per pt.  Pt appreciative of assistance and has no further questions at this time.

## 2017-11-11 ENCOUNTER — Ambulatory Visit (HOSPITAL_BASED_OUTPATIENT_CLINIC_OR_DEPARTMENT_OTHER)
Admission: RE | Admit: 2017-11-11 | Discharge: 2017-11-11 | Disposition: A | Discharge status: Home / Self Care | Payer: PPO | Source: Ambulatory Visit | Attending: Hematology & Oncology | Admitting: Hematology & Oncology

## 2017-11-11 ENCOUNTER — Inpatient Hospital Stay: Payer: PPO | Attending: Hematology & Oncology | Admitting: Family

## 2017-11-11 ENCOUNTER — Other Ambulatory Visit: Payer: Self-pay

## 2017-11-11 ENCOUNTER — Inpatient Hospital Stay: Payer: PPO

## 2017-11-11 VITALS — BP 101/58 | HR 56 | Temp 97.5°F | Resp 18 | Wt 147.8 lb

## 2017-11-11 DIAGNOSIS — Z853 Personal history of malignant neoplasm of breast: Secondary | ICD-10-CM | POA: Insufficient documentation

## 2017-11-11 DIAGNOSIS — C220 Liver cell carcinoma: Secondary | ICD-10-CM

## 2017-11-11 DIAGNOSIS — Z79899 Other long term (current) drug therapy: Secondary | ICD-10-CM | POA: Diagnosis not present

## 2017-11-11 DIAGNOSIS — I8500 Esophageal varices without bleeding: Secondary | ICD-10-CM | POA: Diagnosis not present

## 2017-11-11 DIAGNOSIS — K766 Portal hypertension: Secondary | ICD-10-CM | POA: Insufficient documentation

## 2017-11-11 DIAGNOSIS — I864 Gastric varices: Secondary | ICD-10-CM | POA: Diagnosis not present

## 2017-11-11 DIAGNOSIS — K7460 Unspecified cirrhosis of liver: Secondary | ICD-10-CM | POA: Insufficient documentation

## 2017-11-11 DIAGNOSIS — K7581 Nonalcoholic steatohepatitis (NASH): Secondary | ICD-10-CM

## 2017-11-11 DIAGNOSIS — K769 Liver disease, unspecified: Secondary | ICD-10-CM | POA: Diagnosis not present

## 2017-11-11 DIAGNOSIS — C229 Malignant neoplasm of liver, not specified as primary or secondary: Secondary | ICD-10-CM | POA: Diagnosis not present

## 2017-11-11 DIAGNOSIS — Z7984 Long term (current) use of oral hypoglycemic drugs: Secondary | ICD-10-CM | POA: Insufficient documentation

## 2017-11-11 LAB — CBC WITH DIFFERENTIAL (CANCER CENTER ONLY)
BASOS ABS: 0 10*3/uL (ref 0.0–0.1)
Basophils Relative: 1 %
EOS PCT: 2 %
Eosinophils Absolute: 0.1 10*3/uL (ref 0.0–0.5)
HCT: 35.3 % (ref 34.8–46.6)
Hemoglobin: 12 g/dL (ref 11.6–15.9)
LYMPHS ABS: 0.8 10*3/uL — AB (ref 0.9–3.3)
Lymphocytes Relative: 25 %
MCH: 37.6 pg — AB (ref 26.0–34.0)
MCHC: 34 g/dL (ref 32.0–36.0)
MCV: 110.7 fL — AB (ref 81.0–101.0)
Monocytes Absolute: 0.3 10*3/uL (ref 0.1–0.9)
Monocytes Relative: 10 %
Neutro Abs: 1.9 10*3/uL (ref 1.5–6.5)
Neutrophils Relative %: 62 %
Platelet Count: 83 10*3/uL — ABNORMAL LOW (ref 145–400)
RBC: 3.19 MIL/uL — AB (ref 3.70–5.32)
RDW: 13.2 % (ref 11.1–15.7)
WBC: 3.1 10*3/uL — AB (ref 3.9–10.0)

## 2017-11-11 LAB — CMP (CANCER CENTER ONLY)
ALK PHOS: 76 U/L (ref 26–84)
ALT: 48 U/L — AB (ref 10–47)
ANION GAP: 5 (ref 5–15)
AST: 64 U/L — AB (ref 11–38)
Albumin: 3.6 g/dL (ref 3.5–5.0)
BUN: 11 mg/dL (ref 7–22)
CALCIUM: 9.7 mg/dL (ref 8.0–10.3)
CO2: 27 mmol/L (ref 18–33)
Chloride: 107 mmol/L (ref 98–108)
Creatinine: 0.7 mg/dL (ref 0.60–1.20)
GLUCOSE: 126 mg/dL — AB (ref 73–118)
POTASSIUM: 4.2 mmol/L (ref 3.3–4.7)
Sodium: 139 mmol/L (ref 128–145)
TOTAL PROTEIN: 7 g/dL (ref 6.4–8.1)
Total Bilirubin: 1.7 mg/dL — ABNORMAL HIGH (ref 0.2–1.6)

## 2017-11-11 LAB — LACTATE DEHYDROGENASE: LDH: 300 U/L — AB (ref 98–192)

## 2017-11-11 MED ORDER — IOPAMIDOL (ISOVUE-300) INJECTION 61%
100.0000 mL | Freq: Once | INTRAVENOUS | Status: AC | PRN
  Administered 2017-11-11: 100 mL via INTRAVENOUS

## 2017-11-11 NOTE — Progress Notes (Signed)
Hematology and Oncology Follow Up Visit  Beverly Davis [id number] 1940/08/16 77 y.o. 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 She is doing well and has no complaints at this time. Her CT scan today showed further decrease in size of ablation zone as well as 2 areas of nodular enhancement along the peripheral margin of the ablation defect which the radiologist called nonspecific. Dr. Marin Olp was able to view the scan and we will repeat in another 6 months. AFP is pending for today. Beverly Davis's result was < 0.7.  No lymphadenopathy noted on exam.  She had some diarrhea with the oral contrast for CT but this has improved.  She had a cataract removed from her left eye in July.  No fever, chills, n/v, cough, rash, dizziness, SOB, chest pain, palpitations, abdominal pain or changes in bowel or bladder habits.  No episodes of bleeding, no bruising or petechiae.  No swelling, tenderness, numbness or tingling in her extremities. No c/o pain. She has maintained a good appetite and is staying well hydrated. Her weight is stable.   ECOG Performance Status: 1 - Symptomatic but completely ambulatory  Medications:  Allergies as of 11/11/2017      Reactions   Metformin And Related Other (See Comments)   Welchol [colesevelam Hcl] Other (See Comments)      Medication List        Accurate as of 11/11/17  1:52 PM. Always use your most recent med list.          glipiZIDE 5 MG tablet Commonly known as:  GLUCOTROL TAKE 1 TABLET BY MOUTH THREE TIMES DAILY BEFORE MEAL(S)   lactulose 10 GM/15ML solution Commonly known as:  CHRONULAC Take 30 mLs (20 g total) by mouth 3 (three) times  daily.   levothyroxine 150 MCG tablet Commonly known as:  SYNTHROID, LEVOTHROID TAKE 1 TO 1 & 1/2  TABLETS BY MOUTH ONCE DAILY AS DIRECTED   ONE TOUCH ULTRA TEST test strip Generic drug:  glucose blood CHECK BLOOD SUGAR 1 TIME DAILY   ONETOUCH DELICA LANCETS 91T Misc USE TO CHECK BLOOD SUGAR 1 TIME DAILY   rifampin 300 MG capsule Commonly known as:  RIFADIN Take 1 capsule (300 mg total) by mouth 2 (two) times daily.   sitaGLIPtin-metformin 50-1000 MG tablet Commonly known as:  JANUMET Take 1 tablet by mouth 2 (two) times daily with a meal.   triamcinolone cream 0.5 % Commonly known as:  KENALOG Apply 1 application topically 2 (two) times daily.   VITAMIN D3 PO Take 5,000 Units by mouth daily.       Allergies:  Allergies  Allergen Reactions  . Metformin And Related Other (See Comments)  . Welchol [Colesevelam Hcl] Other (See Comments)    Past Medical History, Surgical history, Social history, and Family History were reviewed and updated.  Review of Systems: All other 10 point review of systems is negative.   Physical Exam:  weight is 147 lb 12 oz (67 kg). Her oral temperature is 97.5 F (36.4 C) (abnormal). Her blood pressure is 101/58 (abnormal) and her pulse is 56 (abnormal). Her respiration is 18 and oxygen saturation is 100%.   Wt Readings from Last 3 Encounters:  11/11/17 147  lb 12 oz (67 kg)  08/06/17 148 lb (67.1 kg)  06/18/17 150 lb 3.2 oz (68.1 kg)    Ocular: Sclerae unicteric, pupils equal, round and reactive to light Ear-nose-throat: Oropharynx clear, dentition fair Lymphatic: No cervical, supraclavicular or axillary adenopathy Lungs no rales or rhonchi, good excursion bilaterally Heart regular rate and rhythm, no murmur appreciated Abd soft, nontender, positive bowel sounds, no liver or spleen tip palpated on exam, no fluid wave  MSK no focal spinal tenderness, no joint edema Neuro: non-focal, well-oriented, appropriate affect Breasts: Deferred    Lab Results  Component Value Date   WBC 3.1 (L) 11/11/2017   HGB 12.0 11/11/2017   HCT 35.3 11/11/2017   MCV 110.7 (H) 11/11/2017   PLT 83 (L) 11/11/2017   Lab Results  Component Value Date   FERRITIN 119 03/23/2016   IRON 36 03/23/2016   TIBC 234 (L) 03/23/2016   UIBC 198 03/23/2016   IRONPCTSAT 15 03/23/2016   Lab Results  Component Value Date   RETICCTPCT 1.2 09/05/2015   RBC 3.19 (L) 11/11/2017   RETICCTABS 40,080 09/05/2015   No results found for: KPAFRELGTCHN, LAMBDASER, KAPLAMBRATIO No results found for: Kandis Cocking [facility] Lab Results  Component Value Date   TOTALPROTELP 7.1 02/03/2008   ALBUMINELP 54.8 (L) 02/03/2008   A1GS 4.2 02/03/2008   A2GS 9.5 02/03/2008   BETS 8.1 (H) 02/03/2008   BETA2SER 5.8 02/03/2008   GAMS 17.6 02/03/2008   MSPIKE NOT DET 02/03/2008   SPEI * 02/03/2008     Chemistry      Component Value Date/Time   NA 139 11/11/2017 0922   NA 139 01/01/2017 1034   K 4.2 11/11/2017 0922   K 4.1 01/01/2017 1034   CL 107 11/11/2017 0922   CL 107 01/01/2017 1034   CO2 27 11/11/2017 0922   CO2 29 01/01/2017 1034   BUN 11 11/11/2017 0922   BUN 9 01/01/2017 1034   CREATININE 0.70 11/11/2017 0922   CREATININE 0.71 08/06/2017 1223      Component Value Date/Time   CALCIUM 9.7 11/11/2017 0922   CALCIUM 9.4 01/01/2017 1034   ALKPHOS 76 11/11/2017 0922   ALKPHOS 81 01/01/2017 1034   AST 64 (H) 11/11/2017 0922   ALT 48 (H) 11/11/2017 0922   ALT 48 (H) 01/01/2017 1034   BILITOT 1.7 (H) 11/11/2017 0922      Impression and Plan: Beverly Davis is a very pleasant 77 yo caucasian female with history of hepatocellular carcinoma. She had RFA in [place], 2018 and did well. Her last AFP in [place] was < 0.7. Today's result is pending.  She has no complaints at this time.  CT scan today showed further decrease in size of ablation zone as well as 2 areas of nodular enhancement along the peripheral margin of the ablation defect which the radiologist  called nonspecific.  We will plan to see her back in another 6 months for follow-up and repeat CT scan that same day.  She will contact our office with any questions or concerns. We can certainly see her sooner if need be.   Laverna Peace, NP 8/20/20191:52 PM

## 2017-11-12 ENCOUNTER — Other Ambulatory Visit: Payer: Self-pay | Admitting: Family

## 2017-11-12 DIAGNOSIS — C220 Liver cell carcinoma: Secondary | ICD-10-CM

## 2017-11-12 LAB — AFP TUMOR MARKER: AFP, Serum, Tumor Marker: 19.1 ng/mL — ABNORMAL HIGH (ref 0.0–8.3)

## 2017-11-12 NOTE — Progress Notes (Signed)
AFP has increased significantly, Dr. Marin Olp aware, MRI of the liver ordered for further evaluation.

## 2017-11-14 ENCOUNTER — Other Ambulatory Visit: Payer: Self-pay | Admitting: Family

## 2017-11-14 ENCOUNTER — Ambulatory Visit: Payer: Self-pay | Admitting: Internal Medicine

## 2017-11-14 ENCOUNTER — Telehealth: Payer: Self-pay | Admitting: Family

## 2017-11-14 NOTE — Telephone Encounter (Signed)
I spoke with Beverly Davis and went over her AFP level result and let her know we need to now get an MRI for further evaluation of the nodules noted on her CT scan earlier this week. All questions at this time were answered and she verbalized understanding and agreement. We will get her MRI scheduled for next week.

## 2017-11-14 NOTE — Telephone Encounter (Signed)
Tried both home number patient left earlier with Hoyle Sauer as well as the mobile phone number listed in her chart. No answer with either number and no voice mail set up on either line. Will try again Monday.

## 2017-11-19 ENCOUNTER — Ambulatory Visit: Payer: Self-pay | Admitting: Internal Medicine

## 2017-11-22 ENCOUNTER — Other Ambulatory Visit: Payer: Self-pay | Admitting: Physician Assistant

## 2017-11-25 ENCOUNTER — Ambulatory Visit (HOSPITAL_COMMUNITY)
Admission: RE | Admit: 2017-11-25 | Discharge: 2017-11-25 | Disposition: A | Discharge status: Home / Self Care | Payer: PPO | Source: Ambulatory Visit | Attending: Family | Admitting: Family

## 2017-11-25 DIAGNOSIS — K766 Portal hypertension: Secondary | ICD-10-CM | POA: Diagnosis not present

## 2017-11-25 DIAGNOSIS — K7460 Unspecified cirrhosis of liver: Secondary | ICD-10-CM | POA: Diagnosis not present

## 2017-11-25 DIAGNOSIS — I868 Varicose veins of other specified sites: Secondary | ICD-10-CM | POA: Diagnosis not present

## 2017-11-25 DIAGNOSIS — R911 Solitary pulmonary nodule: Secondary | ICD-10-CM | POA: Insufficient documentation

## 2017-11-25 DIAGNOSIS — K7689 Other specified diseases of liver: Secondary | ICD-10-CM | POA: Insufficient documentation

## 2017-11-25 DIAGNOSIS — C220 Liver cell carcinoma: Secondary | ICD-10-CM | POA: Insufficient documentation

## 2017-11-25 MED ORDER — GADOBENATE DIMEGLUMINE 529 MG/ML IV SOLN
15.0000 mL | Freq: Once | INTRAVENOUS | Status: AC | PRN
  Administered 2017-11-25: 13 mL via INTRAVENOUS

## 2017-11-26 ENCOUNTER — Ambulatory Visit: Payer: Self-pay | Admitting: Internal Medicine

## 2017-11-26 ENCOUNTER — Encounter: Payer: Self-pay | Admitting: Internal Medicine

## 2017-11-26 DIAGNOSIS — Z9119 Patient's noncompliance with other medical treatment and regimen: Secondary | ICD-10-CM | POA: Insufficient documentation

## 2017-11-26 DIAGNOSIS — Z91199 Patient's noncompliance with other medical treatment and regimen due to unspecified reason: Secondary | ICD-10-CM | POA: Insufficient documentation

## 2017-11-26 DIAGNOSIS — Z5329 Procedure and treatment not carried out because of patient's decision for other reasons: Secondary | ICD-10-CM | POA: Insufficient documentation

## 2017-11-26 NOTE — Progress Notes (Signed)
  N  O S  H  O  W                                                                         This very nice 77 y.o.female presents for 3 month follow up with HTN, HLD, Pre-Diabetes and Vitamin D Deficiency.     Patient dcx'd w/HCC in Nov 2017 ,  Then Feb 2018 had RadioEmbolization of a Rt Hepatic lobe [place] followed in Mar 2018 with a thermal Microwave Ablation. Recent Abd MRI 9/3/201`9 , suspect for recurrent HCC with AFP risen from <0.7 to 19.1 !.      Patient is treated for HTN & BP has been controlled at home. Today's  . Patient has had no complaints of any cardiac type chest pain, palpitations, dyspnea / orthopnea / PND, dizziness, claudication, or dependent edema.     Hyperlipidemia is controlled with diet & meds. Patient denies myalgias or other med SE's. Last Lipids were  Lab Results  Component Value Date   CHOL 190 08/06/2017   HDL 68 08/06/2017   Griffith 99 08/06/2017   TRIG 133 08/06/2017   CHOLHDL 2.8 08/06/2017      Also, the patient has history of T2_NIDDM PreDiabetes and has had no symptoms of reactive hypoglycemia, diabetic polys, paresthesias or visual blurring.  Last A1c was  Lab Results  Component Value Date   HGBA1C 6.2 (H) 08/06/2017      Further, the patient also has history of Vitamin D Deficiency and supplements vitamin D without any suspected side-effects. Last vitamin D was   Lab Results  Component Value Date   VD25OH 71 05/07/2017

## 2017-11-27 ENCOUNTER — Encounter: Payer: Self-pay | Admitting: Internal Medicine

## 2017-11-27 NOTE — Patient Instructions (Signed)

## 2017-11-27 NOTE — Progress Notes (Signed)
This very nice 77 y.o. MWF  presents for 6 month follow up with HTN, HLD, Hypothyroidism, Remote Ca of the Breast (2008),  Pre-Diabetes and Vitamin D Deficiency.          Patient has hx/o NASH/Cirrhosis and has been hospitalized with hepatic encephalopathy.  She was dx'd w/HCC in Nov 2017 and in Feb 2018, she had RadioEmbolization of a Rt Hepatic lobe [place] followed in Mar 2018 with a thermal Microwave Ablation. Recent Abd MRI on  11/25/2017 is suspect for recurrent HCC with AFP risen from <0.7 to 19.1 !             Patient is followed expectantly for labile HTN & BP has been controlled at home. Today's BP is at goal - 102/56 Patient has had no complaints of any cardiac type chest pain, palpitations, dyspnea / orthopnea / PND, dizziness, claudication, or dependent edema.     Hyperlipidemia is controlled with diet & meds. Patient denies myalgias or other med SE's. Last Lipids were  Lab Results  Component Value Date   CHOL 190 08/06/2017   HDL 68 08/06/2017   [place] 99 08/06/2017   TRIG 133 08/06/2017   CHOLHDL 2.8 08/06/2017      Also, the patient has history of T2_NIDDM circa 2007 and compliance /control of her diabetes has been sub-optimal.  She has had no symptoms of reactive hypoglycemia, diabetic polys, paresthesias or visual blurring. She reports  CBG's  range in the 130's up to 170 range. Last A1c was nearer goal: Lab Results  Component Value Date   HGBA1C 6.2 (H) 08/06/2017      Aldo, patient has been on thyroid replacement circa 2000.     Further, the patient also has history of Vitamin D Deficiency ("35"/2015) and supplements vitamin D without any suspected side-effects. Last vitamin D was at goal:   Lab Results  Component Value Date   VD25OH 71 05/07/2017   Current Outpatient Medications on File Prior to Visit  Medication Sig  . Cholecalciferol (VITAMIN D3 PO) Take 5,000 Units by mouth daily.  Marland Kitchen glipiZIDE (GLUCOTROL) 5 MG tablet TAKE 1 TABLET BY MOUTH THREE  TIMES DAILY BEFORE MEAL(S)  . lactulose (CHRONULAC) 10 GM/15ML solution Take 30 mLs (20 g total) by mouth 3 (three) times daily. (Patient taking differently: Take 30 g by mouth daily. )  . levothyroxine (SYNTHROID, LEVOTHROID) 150 MCG tablet TAKE 1 TO 1 & 1/2  TABLETS BY MOUTH ONCE DAILY AS DIRECTED (Patient taking differently: 1/2 tab daily)  . ONE TOUCH ULTRA TEST test strip CHECK BLOOD SUGAR ONE TIME DAILY  . ONETOUCH DELICA LANCETS 35H MISC USE TO CHECK BLOOD SUGAR 1 TIME DAILY  . sitaGLIPtin-metformin (JANUMET) 50-1000 MG tablet Take 1 tablet by mouth 2 (two) times daily with a meal.  . triamcinolone cream (KENALOG) 0.5 % Apply 1 application topically 2 (two) times daily.  . rifampin (RIFADIN) 300 MG capsule Take 1 capsule (300 mg total) by mouth 2 (two) times daily. (Patient not taking: Reported on 11/28/2017)   No current facility-administered medications on file prior to visit.    Allergies  Allergen Reactions  . Metformin And Related Other (See Comments)  . Welchol [Colesevelam Hcl] Other (See Comments)   PMHx:   Past Medical History:  Diagnosis Date  . Breast cancer ([place]) 05/2006   Right breast cancer  . Cirrhosis ([place])   . Diabetes mellitus without  complication ([place])    type 2  . Fibroids   . Hepatocellular carcinoma ([place]) 05/08/2016  . Hypothyroidism   . Osteopenia   . Personal history of radiation therapy 2008  . Thrombocytopenia ([place])   . Thyroid disease    Immunization History  Administered Date(s) Administered  . Influenza Split 03/06/2011, 01/06/2013  . Influenza, High Dose Seasonal PF 01/12/2014, 12/14/2014, 12/27/2015, 01/22/2017  . Pneumococcal Conjugate-13 01/12/2014  . Pneumococcal Polysaccharide-23 08/30/2014  . Tdap 06/10/2012   Past Surgical History:  Procedure Laterality Date  . ABDOMINAL HYSTERECTOMY     TAH BSO  . BREAST LUMPECTOMY Right 2008  . BREAST LUMPECTOMY WITH AXILLARY LYMPH NODE BIOPSY Right 06/25/2006  . CHOLECYSTECTOMY    . IR GENERIC  HISTORICAL  03/28/2016   IR RADIOLOGIST EVAL & MGMT 03/28/2016 Corrie Mckusick, DO [facility]  . IR GENERIC HISTORICAL  05/01/2016   IR ANGIOGRAM VISCERAL SELECTIVE 05/01/2016 Corrie Mckusick, DO [facility]  . IR GENERIC HISTORICAL  05/01/2016   IR ANGIOGRAM VISCERAL SELECTIVE 05/01/2016 Corrie Mckusick, DO [facility]  . IR GENERIC HISTORICAL  05/01/2016   IR ANGIOGRAM SELECTIVE EACH ADDITIONAL VESSEL 05/01/2016 Corrie Mckusick, DO [facility]  . IR GENERIC HISTORICAL  05/01/2016   IR ANGIOGRAM SELECTIVE EACH ADDITIONAL VESSEL 05/01/2016 Corrie Mckusick, DO [facility]  . IR GENERIC HISTORICAL  05/01/2016   IR ANGIOGRAM SELECTIVE EACH ADDITIONAL VESSEL 05/01/2016 Corrie Mckusick, DO [facility]  . IR GENERIC HISTORICAL  05/01/2016   IR EMBO TUMOR ORGAN ISCHEMIA INFARCT INC GUIDE ROADMAPPING 05/01/2016 Corrie Mckusick, DO [facility]  . IR GENERIC HISTORICAL  05/01/2016   IR CT SPINE LTD 05/01/2016 Corrie Mckusick, DO [facility]  . IR GENERIC HISTORICAL  05/01/2016   IR US GUIDE VASC ACCESS RIGHT 05/01/2016 Corrie Mckusick, DO [facility]  . IR GENERIC HISTORICAL  05/01/2016   IR ANGIOGRAM SELECTIVE EACH ADDITIONAL VESSEL 05/01/2016 Corrie Mckusick, DO [facility]  . IR RADIOLOGIST EVAL & MGMT  07/31/2016  . IR RADIOLOGIST EVAL & MGMT  12/25/2016  . RADIOFREQUENCY ABLATION N/A 06/14/2016   Procedure: LIVER MICROWAVE THERMAL  ABLATION;  Surgeon: Corrie Mckusick, DO;  Location: [facility];  Service: Anesthesiology;  Laterality: N/A;  . TUBAL LIGATION  1978   FHx:    Reviewed / unchanged  SHx:    Reviewed / unchanged   Systems Review:  Constitutional: Denies fever, chills, wt changes, headaches, insomnia, fatigue, night sweats, change in appetite. Eyes: Denies redness, blurred vision, diplopia, discharge, itchy, watery eyes.  ENT: Denies discharge, congestion, post nasal drip, epistaxis, sore throat, earache, hearing loss, dental pain, tinnitus, vertigo, sinus pain, snoring.  CV: Denies chest pain, palpitations, irregular heartbeat,  syncope, dyspnea, diaphoresis, orthopnea, PND, claudication or edema. Respiratory: denies cough, dyspnea, DOE, pleurisy, hoarseness, laryngitis, wheezing.  Gastrointestinal: Denies dysphagia, odynophagia, heartburn, reflux, water brash, abdominal pain or cramps, nausea, vomiting, bloating, diarrhea, constipation, hematemesis, melena, hematochezia  or hemorrhoids. Genitourinary: Denies dysuria, frequency, urgency, nocturia, hesitancy, discharge, hematuria or flank pain. Musculoskeletal: Denies arthralgias, myalgias, stiffness, jt. swelling, pain, limping or strain/sprain.  Skin: Denies pruritus, rash, hives, warts, acne, eczema or change in skin lesion(s). Neuro: No weakness, tremor, incoordination, spasms, paresthesia or pain. Psychiatric: Denies confusion, memory loss or sensory loss. Endo: Denies change in weight, skin or hair change.  Heme/Lymph: No excessive bleeding, bruising or enlarged lymph nodes.  Physical Exam  BP (!) 102/56   Pulse 64   Temp (!) 97.5 F (36.4 C)   Resp 16   Ht  5' 2"  (1.575 m)   Wt 146 lb 9.6 oz (66.5 kg)   BMI 26.81 kg/m   Appears  well nourished, well groomed  and in no distress.  Eyes: PERRLA, EOMs, conjunctiva no swelling or erythema. Sinuses: No frontal/maxillary tenderness ENT/Mouth: EAC's clear, TM's nl w/o erythema, bulging. Nares clear w/o erythema, swelling, exudates. Oropharynx clear without erythema or exudates. Oral hygiene is good. Tongue normal, non obstructing. Hearing intact.  Neck: Supple. Thyroid not palpable. Car 2+/2+ without bruits, nodes or JVD. Chest: Respirations nl with BS clear & equal w/o rales, rhonchi, wheezing or stridor.  Cor: Heart sounds normal w/ regular rate and rhythm without sig. murmurs, gallops, clicks or rubs. Peripheral pulses normal and equal  without edema.  Abdomen: Soft & bowel sounds normal. Non-tender w/o guarding, rebound, hernias, masses or organomegaly.  Lymphatics: Unremarkable.  Musculoskeletal: Full ROM  all peripheral extremities, joint stability, 5/5 strength and normal gait.  Skin: Warm, dry without exposed rashes, lesions or ecchymosis apparent.  Neuro: Cranial nerves intact, reflexes equal bilaterally. Sensory-motor testing grossly intact. Tendon reflexes grossly intact.  Pysch: Alert & oriented x 3.  Insight and judgement nl & appropriate. No ideations.  Assessment and Plan:  1. Essential hypertension  - Continue medication, monitor blood pressure at home.  - Continue DASH diet.  Reminder to go to the ER if any CP,  SOB, nausea, dizziness, severe HA, changes vision/speech.  - COMPLETE METABOLIC PANEL WITH GFR - Magnesium - TSH  2. Hyperlipidemia, mixed  - Continue diet/meds, exercise,& lifestyle modifications.  - Continue monitor periodic cholesterol/liver & renal functions   - Lipid panel - TSH  3. Type 2 diabetes mellitus with stage 2 chronic kidney disease, without long-term current use of insulin (HCC)  - Hemoglobin A1c - Insulin, random  4. Vitamin D deficiency  - Continue diet, exercise, lifestyle modifications.  - Monitor appropriate labs. - Continue supplementation.  - VITAMIN D 25 Hydroxyl  5. Hypothyroidism  - TSH  6. Hepatocellular carcinoma (HCC)  - COMPLETE METABOLIC PANEL WITH GFR  7. Medication management  - CBC with Differential/Platelet - COMPLETE METABOLIC PANEL WITH GFR - Magnesium - Lipid panel - TSH - Hemoglobin A1c - Insulin, random - VITAMIN D 25 Hydroxyl        Discussed  regular exercise, BP monitoring, weight control to achieve/maintain BMI less than 25 and discussed med and SE's. Recommended labs to assess and monitor clinical status with further disposition pending results of labs. Over 30 minutes of exam, counseling, chart review was performed.

## 2017-11-28 ENCOUNTER — Ambulatory Visit (INDEPENDENT_AMBULATORY_CARE_PROVIDER_SITE_OTHER): Payer: PPO | Admitting: Internal Medicine

## 2017-11-28 ENCOUNTER — Encounter: Payer: Self-pay | Admitting: Internal Medicine

## 2017-11-28 VITALS — BP 102/56 | HR 64 | Temp 97.5°F | Resp 16 | Ht 62.0 in | Wt 146.6 lb

## 2017-11-28 DIAGNOSIS — I10 Essential (primary) hypertension: Secondary | ICD-10-CM

## 2017-11-28 DIAGNOSIS — E1122 Type 2 diabetes mellitus with diabetic chronic kidney disease: Secondary | ICD-10-CM

## 2017-11-28 DIAGNOSIS — C220 Liver cell carcinoma: Secondary | ICD-10-CM | POA: Diagnosis not present

## 2017-11-28 DIAGNOSIS — Z79899 Other long term (current) drug therapy: Secondary | ICD-10-CM

## 2017-11-28 DIAGNOSIS — N182 Chronic kidney disease, stage 2 (mild): Secondary | ICD-10-CM

## 2017-11-28 DIAGNOSIS — E782 Mixed hyperlipidemia: Secondary | ICD-10-CM

## 2017-11-28 DIAGNOSIS — E559 Vitamin D deficiency, unspecified: Secondary | ICD-10-CM

## 2017-11-28 DIAGNOSIS — E039 Hypothyroidism, unspecified: Secondary | ICD-10-CM

## 2017-11-29 ENCOUNTER — Other Ambulatory Visit: Payer: Self-pay | Admitting: Internal Medicine

## 2017-11-29 DIAGNOSIS — E039 Hypothyroidism, unspecified: Secondary | ICD-10-CM

## 2017-11-29 DIAGNOSIS — D649 Anemia, unspecified: Secondary | ICD-10-CM

## 2017-12-01 ENCOUNTER — Encounter: Payer: Self-pay | Admitting: *Deleted

## 2017-12-01 ENCOUNTER — Telehealth: Payer: Self-pay | Admitting: Family

## 2017-12-01 LAB — CBC WITH DIFFERENTIAL/PLATELET
BASOS PCT: 1.6 %
Basophils Absolute: 38 cells/uL (ref 0–200)
Eosinophils Absolute: 60 cells/uL (ref 15–500)
Eosinophils Relative: 2.5 %
HCT: 31.2 % — ABNORMAL LOW (ref 35.0–45.0)
Hemoglobin: 10.8 g/dL — ABNORMAL LOW (ref 11.7–15.5)
Lymphs Abs: 602 cells/uL — ABNORMAL LOW (ref 850–3900)
MCH: 37.4 pg — AB (ref 27.0–33.0)
MCHC: 34.6 g/dL (ref 32.0–36.0)
MCV: 108 fL — AB (ref 80.0–100.0)
MONOS PCT: 10.7 %
MPV: 10.8 fL (ref 7.5–12.5)
NEUTROS PCT: 60.1 %
Neutro Abs: 1442 cells/uL — ABNORMAL LOW (ref 1500–7800)
PLATELETS: 112 10*3/uL — AB (ref 140–400)
RBC: 2.89 10*6/uL — AB (ref 3.80–5.10)
RDW: 13 % (ref 11.0–15.0)
Total Lymphocyte: 25.1 %
WBC mixed population: 257 cells/uL (ref 200–950)
WBC: 2.4 10*3/uL — AB (ref 3.8–10.8)

## 2017-12-01 LAB — COMPLETE METABOLIC PANEL WITH GFR
AG RATIO: 1.3 (calc) (ref 1.0–2.5)
ALT: 34 U/L — ABNORMAL HIGH (ref 6–29)
AST: 45 U/L — AB (ref 10–35)
Albumin: 3.5 g/dL — ABNORMAL LOW (ref 3.6–5.1)
Alkaline phosphatase (APISO): 76 U/L (ref 33–130)
BILIRUBIN TOTAL: 1.2 mg/dL (ref 0.2–1.2)
BUN: 11 mg/dL (ref 7–25)
CALCIUM: 9.2 mg/dL (ref 8.6–10.4)
CHLORIDE: 105 mmol/L (ref 98–110)
CO2: 25 mmol/L (ref 20–32)
Creat: 0.73 mg/dL (ref 0.60–0.93)
GFR, Est African American: 93 mL/min/{1.73_m2} (ref >=60)
GFR, Est Non African American: 80 mL/min/{1.73_m2} (ref >=60)
GLUCOSE: 169 mg/dL — AB (ref 65–99)
Globulin: 2.7 g/dL (calc) (ref 1.9–3.7)
POTASSIUM: 4.4 mmol/L (ref 3.5–5.3)
Sodium: 139 mmol/L (ref 135–146)
TOTAL PROTEIN: 6.2 g/dL (ref 6.1–8.1)

## 2017-12-01 LAB — MAGNESIUM: Magnesium: 1.5 mg/dL (ref 1.5–2.5)

## 2017-12-01 LAB — HEMOGLOBIN A1C
EAG (MMOL/L): 7.6 (calc)
HEMOGLOBIN A1C: 6.4 %{Hb} — AB (ref <5.7)
MEAN PLASMA GLUCOSE: 137 (calc)

## 2017-12-01 LAB — LIPID PANEL
CHOL/HDL RATIO: 2.8 (calc) (ref <5.0)
CHOLESTEROL: 189 mg/dL (ref <200)
HDL: 68 mg/dL (ref >50)
LDL Cholesterol (Calc): 105 mg/dL (calc) — ABNORMAL HIGH
Non-HDL Cholesterol (Calc): 121 mg/dL (calc) (ref <130)
Triglycerides: 75 mg/dL (ref <150)

## 2017-12-01 LAB — TSH: TSH: 6.97 m[IU]/L — AB (ref 0.40–4.50)

## 2017-12-01 LAB — INSULIN, RANDOM: INSULIN: 28.2 u[IU]/mL — AB (ref 2.0–19.6)

## 2017-12-01 LAB — VITAMIN D 25 HYDROXY (VIT D DEFICIENCY, FRACTURES): VIT D 25 HYDROXY: 75 ng/mL (ref 30–100)

## 2017-12-01 NOTE — Telephone Encounter (Signed)
I went over Beverly Davis MRI results with her in detail. She follows up with Dr. Marin Olp later this week to discuss treatment plan. All questions at this time were answered and she verbalized understanding.

## 2017-12-03 ENCOUNTER — Other Ambulatory Visit: Payer: Self-pay

## 2017-12-03 ENCOUNTER — Encounter: Payer: Self-pay | Admitting: Hematology & Oncology

## 2017-12-03 ENCOUNTER — Inpatient Hospital Stay: Payer: PPO

## 2017-12-03 ENCOUNTER — Inpatient Hospital Stay: Payer: PPO | Attending: Hematology & Oncology | Admitting: Hematology & Oncology

## 2017-12-03 VITALS — BP 116/53 | HR 59 | Temp 97.7°F | Resp 16 | Wt 148.0 lb

## 2017-12-03 DIAGNOSIS — K7581 Nonalcoholic steatohepatitis (NASH): Secondary | ICD-10-CM

## 2017-12-03 DIAGNOSIS — Z7984 Long term (current) use of oral hypoglycemic drugs: Secondary | ICD-10-CM

## 2017-12-03 DIAGNOSIS — Z853 Personal history of malignant neoplasm of breast: Secondary | ICD-10-CM

## 2017-12-03 DIAGNOSIS — C220 Liver cell carcinoma: Secondary | ICD-10-CM | POA: Diagnosis not present

## 2017-12-03 DIAGNOSIS — R413 Other amnesia: Secondary | ICD-10-CM | POA: Diagnosis not present

## 2017-12-03 DIAGNOSIS — Z79899 Other long term (current) drug therapy: Secondary | ICD-10-CM | POA: Diagnosis not present

## 2017-12-03 DIAGNOSIS — K7460 Unspecified cirrhosis of liver: Secondary | ICD-10-CM

## 2017-12-03 LAB — CBC WITH DIFFERENTIAL (CANCER CENTER ONLY)
Basophils Absolute: 0 10*3/uL (ref 0.0–0.1)
Basophils Relative: 1 %
EOS ABS: 0.1 10*3/uL (ref 0.0–0.5)
Eosinophils Relative: 2 %
HCT: 33.1 % — ABNORMAL LOW (ref 34.8–46.6)
HEMOGLOBIN: 11.1 g/dL — AB (ref 11.6–15.9)
LYMPHS PCT: 24 %
Lymphs Abs: 0.7 10*3/uL — ABNORMAL LOW (ref 0.9–3.3)
MCH: 37 pg — AB (ref 26.0–34.0)
MCHC: 33.5 g/dL (ref 32.0–36.0)
MCV: 110.3 fL — ABNORMAL HIGH (ref 81.0–101.0)
MONOS PCT: 13 %
Monocytes Absolute: 0.4 10*3/uL (ref 0.1–0.9)
NEUTROS PCT: 60 %
Neutro Abs: 1.8 10*3/uL (ref 1.5–6.5)
Platelet Count: 98 10*3/uL — ABNORMAL LOW (ref 145–400)
RBC: 3 MIL/uL — AB (ref 3.70–5.32)
RDW: 12.8 % (ref 11.1–15.7)
WBC: 3 10*3/uL — AB (ref 3.9–10.0)

## 2017-12-03 LAB — CMP (CANCER CENTER ONLY)
ALK PHOS: 88 U/L — AB (ref 26–84)
ALT: 49 U/L — AB (ref 10–47)
AST: 68 U/L — ABNORMAL HIGH (ref 11–38)
Albumin: 3.1 g/dL — ABNORMAL LOW (ref 3.5–5.0)
Anion gap: 0 — ABNORMAL LOW (ref 5–15)
BILIRUBIN TOTAL: 1.4 mg/dL (ref 0.2–1.6)
BUN: 13 mg/dL (ref 7–22)
CHLORIDE: 111 mmol/L — AB (ref 98–108)
CO2: 27 mmol/L (ref 18–33)
Calcium: 10 mg/dL (ref 8.0–10.3)
Creatinine: 0.8 mg/dL (ref 0.60–1.20)
Glucose, Bld: 148 mg/dL — ABNORMAL HIGH (ref 73–118)
POTASSIUM: 3.9 mmol/L (ref 3.3–4.7)
SODIUM: 138 mmol/L (ref 128–145)
TOTAL PROTEIN: 6.5 g/dL (ref 6.4–8.1)

## 2017-12-03 LAB — AMMONIA: Ammonia: 43 umol/L — ABNORMAL HIGH (ref 9–35)

## 2017-12-03 NOTE — Progress Notes (Signed)
Hematology and Oncology Follow Up Visit  ANNEMARIE SEBREE [id number] Oct 10, 1940 77 y.o. 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 Schmelter is here today for follow-up.  She comes in with her son and husband.  We saw her back in August.  At that time, we repeated a alpha-fetoprotein on her.  This unfortunately went from 1 up to 19.1.  We then did a MRI.  This was done on 11/25/2017.  The MRI showed some hypervascular nodules along the posterior margin of the ablation defect in the right lobe of the liver.  No other abnormality was noted.  There was a sub-centimeter pulmonary nodule in the right lower lobe.  In light of this, I had her come in so I could talk to her about how we could try to help with this.  I am not sure that this is a problem that could be treated with RFA.  However, I do think that this might be a problem that could be treated with TACE.  She does have some memory issues.  She has underlying cirrhosis.  However, I think her liver function is not all that bad.  Her albumin is 3.1.  There is no ascites.  She has decent liver function studies.  There is no pain.  She is eating well.  She is having no nausea or vomiting.  She is having no cough.  There is no bleeding.  Para she has some chronic mild edema in her legs.  There is no fever.  Overall, I said her performance status is ECOG 1.   Medications:  Allergies as of 12/03/2017      Reactions   Metformin And Related Other (See Comments)   Welchol [colesevelam Hcl] Other (See Comments)      Medication List        Accurate as of 12/03/17  2:00 PM. Always use your most recent med list.          glipiZIDE 5 MG tablet Commonly known as:   GLUCOTROL TAKE 1 TABLET BY MOUTH THREE TIMES DAILY BEFORE MEAL(S)   lactulose 10 GM/15ML solution Commonly known as:  CHRONULAC Take 30 mLs (20 g total) by mouth 3 (three) times daily.   levothyroxine 150 MCG tablet Commonly known as:  SYNTHROID, LEVOTHROID TAKE 1 TO 1 & 1/2  TABLETS BY MOUTH ONCE DAILY AS DIRECTED   ONE TOUCH ULTRA TEST test strip Generic drug:  glucose blood CHECK BLOOD SUGAR ONE TIME DAILY   ONETOUCH DELICA LANCETS 48J Misc USE TO CHECK BLOOD SUGAR 1 TIME DAILY   sitaGLIPtin-metformin 50-1000 MG tablet Commonly known as:  JANUMET Take 1 tablet by mouth 2 (two) times daily with a meal.   triamcinolone cream 0.5 % Commonly known as:  KENALOG Apply 1 application topically 2 (two) times daily.   VITAMIN D3 PO Take 5,000 Units by mouth daily.       Allergies:  Allergies  Allergen Reactions  . Metformin And Related Other (See Comments)  . Welchol [Colesevelam Hcl] Other (See Comments)    Past Medical History, Surgical history, Social history, and Family History were reviewed and updated.  Review of Systems: Review of Systems  Constitutional: Negative.   HENT: Negative.   Eyes: Negative.  Respiratory: Negative.   Cardiovascular: Negative.   Gastrointestinal: Negative.   Genitourinary: Negative.   Musculoskeletal: Negative.   Skin: Negative.   Neurological: Negative.   Endo/Heme/Allergies: Negative.   Psychiatric/Behavioral: Positive for memory loss.      Physical Exam:  weight is 148 lb (67.1 kg). Her oral temperature is 97.7 F (36.5 C). Her blood pressure is 116/53 (abnormal) and her pulse is 59 (abnormal). Her respiration is 16 and oxygen saturation is 100%.   Wt Readings from Last 3 Encounters:  12/03/17 148 lb (67.1 kg)  11/28/17 146 lb 9.6 oz (66.5 kg)  11/11/17 147 lb 12 oz (67 kg)    Physical Exam  Constitutional: She is oriented to person, place, and time.  HENT:  Head: Normocephalic and atraumatic.  Mouth/Throat:  Oropharynx is clear and moist.  Eyes: Pupils are equal, round, and reactive to light. EOM are normal.  Neck: Normal range of motion.  Cardiovascular: Normal rate, regular rhythm and normal heart sounds.  Pulmonary/Chest: Effort normal and breath sounds normal.  Abdominal: Soft. Bowel sounds are normal.  Musculoskeletal: Normal range of motion. She exhibits no edema, tenderness or deformity.  Lymphadenopathy:    She has no cervical adenopathy.  Neurological: She is alert and oriented to person, place, and time.  Skin: Skin is warm and dry. No rash noted. No erythema.  Psychiatric: She has a normal mood and affect. Her behavior is normal. Judgment and thought content normal.  Vitals reviewed.    Lab Results  Component Value Date   WBC 2.4 (L) 11/28/2017   HGB 10.8 (L) 11/28/2017   HCT 31.2 (L) 11/28/2017   MCV 108.0 (H) 11/28/2017   PLT 112 (L) 11/28/2017   Lab Results  Component Value Date   FERRITIN 119 03/23/2016   IRON 36 03/23/2016   TIBC 234 (L) 03/23/2016   UIBC 198 03/23/2016   IRONPCTSAT 15 03/23/2016   Lab Results  Component Value Date   RETICCTPCT 1.2 09/05/2015   RBC 2.89 (L) 11/28/2017   RETICCTABS 40,080 09/05/2015   No results found for: KPAFRELGTCHN, LAMBDASER, KAPLAMBRATIO No results found for: Kandis Cocking, [facility] Lab Results  Component Value Date   TOTALPROTELP 7.1 02/03/2008   ALBUMINELP 54.8 (L) 02/03/2008   A1GS 4.2 02/03/2008   A2GS 9.5 02/03/2008   BETS 8.1 (H) 02/03/2008   BETA2SER 5.8 02/03/2008   GAMS 17.6 02/03/2008   MSPIKE NOT DET 02/03/2008   SPEI * 02/03/2008     Chemistry      Component Value Date/Time   NA 139 11/28/2017 1108   NA 139 01/01/2017 1034   K 4.4 11/28/2017 1108   K 4.1 01/01/2017 1034   CL 105 11/28/2017 1108   CL 107 01/01/2017 1034   CO2 25 11/28/2017 1108   CO2 29 01/01/2017 1034   BUN 11 11/28/2017 1108   BUN 9 01/01/2017 1034   CREATININE 0.73 11/28/2017 1108      Component Value Date/Time    CALCIUM 9.2 11/28/2017 1108   CALCIUM 9.4 01/01/2017 1034   ALKPHOS 76 11/11/2017 0922   ALKPHOS 81 01/01/2017 1034   AST 45 (H) 11/28/2017 1108   AST 64 (H) 11/11/2017 0922   ALT 34 (H) 11/28/2017 1108   ALT 48 (H) 11/11/2017 0922   ALT 48 (H) 01/01/2017 1034   BILITOT 1.2 11/28/2017 1108   BILITOT 1.7 (H) 11/11/2017 0922      Impression and Plan: Ms. Leske is a very pleasant 77 yo caucasian female with history of  hepatocellular carcinoma. She had RFA in March, 2018 and did well.   I will have to get in touch with interventional radiology.  I will need to see what they think about the possibility of doing TACE.  I would hate to have to try systemic therapy on her.  This recurrence seems to be localized where she had the RFA.  I will have interventional radiology see Ms. Dutkiewicz and hopefully be able to help her out.  If, they cannot help her out, we may have to consider systemic therapy with Nexavar or possibly Lenvima which now is approved for first-line therapy.  I spent about 40 minutes with Ms. Pentland and her family.  I reviewed the MRI with them.  I spent all the time face-to-face with them.  I answered all their questions.  I would like to see her back in about a month.  By then, we should know if we have to embark upon systemic therapy.    [name], MD 9/11/20192:00 PM

## 2017-12-04 LAB — AFP TUMOR MARKER: AFP, Serum, Tumor Marker: 19.6 ng/mL — ABNORMAL HIGH (ref 0.0–8.3)

## 2017-12-05 ENCOUNTER — Other Ambulatory Visit: Payer: Self-pay | Admitting: Hematology & Oncology

## 2017-12-05 ENCOUNTER — Inpatient Hospital Stay: Payer: PPO | Admitting: Hematology & Oncology

## 2017-12-05 DIAGNOSIS — C220 Liver cell carcinoma: Secondary | ICD-10-CM

## 2017-12-05 NOTE — Progress Notes (Signed)
I put in the request for IR consult on 089/13/2019.  Beverly Davis

## 2017-12-10 ENCOUNTER — Encounter: Payer: Self-pay | Admitting: *Deleted

## 2017-12-10 ENCOUNTER — Ambulatory Visit
Admission: RE | Admit: 2017-12-10 | Discharge: 2017-12-10 | Disposition: A | Discharge status: Home / Self Care | Payer: PPO | Source: Ambulatory Visit | Attending: Hematology & Oncology | Admitting: Hematology & Oncology

## 2017-12-10 DIAGNOSIS — R978 Other abnormal tumor markers: Secondary | ICD-10-CM | POA: Diagnosis not present

## 2017-12-10 DIAGNOSIS — C220 Liver cell carcinoma: Secondary | ICD-10-CM | POA: Diagnosis not present

## 2017-12-10 HISTORY — PX: IR RADIOLOGIST EVAL & MGMT: IMG5224

## 2017-12-10 NOTE — Progress Notes (Signed)
Chief Complaint: Beverly Davis  Referring Physician(s): Ennever,Peter R  History of Present Illness: Beverly Davis is a 77 y.o. female presenting to Vascular & Interventional Radiology clinic today for scheduled follow up, status-post treatment of right liver HCC.   We met Beverly Davis first 03/28/2016, and treated a 4cm tumor of segment 8 with combination therapy -- arterial embolization and then microwave ablation.  The first treatment was 05/01/2016 as outpatient, and then the microwave ablation 06/14/2016 with overnight observation.    She recovered well from both treatments, and returns today with repeat imaging.   She recently had contrast CT and MR performed 11/11/2017 and 11/25/2017, respectively.  This is compared to her post-operative imaging 12/2016, and 05/2017.    She has imaging evidence of recurrence of Beverly Davis in the right lobe, with 4 separate foci, largest measuring 1.5cm inferior to the prior treatment site. These are new from the prior imaging.   We spent the majority of today's visit discussing her imaging and options for treatment.    She is feeling well, and continues to spend much time with her family, in particular her grandchildren.  She has not had any recent hospitalizations.   Her AFP marker had dropped after her first treatment from 461 to 3.4 (October 2018).  Most recently, the value has increased to 19.      Past Medical History:  Diagnosis Date  . Breast cancer ([place]) 05/2006   Right breast cancer  . Cirrhosis ([place])   . Diabetes mellitus without complication ([place])    type 2  . Fibroids   . Hepatocellular carcinoma ([place]) 05/08/2016  . Hypothyroidism   . Osteopenia   . Personal history of radiation therapy 2008  . Thrombocytopenia ([place])   . Thyroid disease     Past Surgical History:  Procedure Laterality Date  . ABDOMINAL HYSTERECTOMY     TAH BSO  . BREAST LUMPECTOMY Right 2008  . BREAST LUMPECTOMY WITH AXILLARY LYMPH NODE BIOPSY Right 06/25/2006  .  CHOLECYSTECTOMY    . IR GENERIC HISTORICAL  03/28/2016   IR RADIOLOGIST EVAL & MGMT 03/28/2016 Corrie Mckusick, DO [facility]  . IR GENERIC HISTORICAL  05/01/2016   IR ANGIOGRAM VISCERAL SELECTIVE 05/01/2016 Corrie Mckusick, DO [facility]  . IR GENERIC HISTORICAL  05/01/2016   IR ANGIOGRAM VISCERAL SELECTIVE 05/01/2016 Corrie Mckusick, DO [facility]  . IR GENERIC HISTORICAL  05/01/2016   IR ANGIOGRAM SELECTIVE EACH ADDITIONAL VESSEL 05/01/2016 Corrie Mckusick, DO [facility]  . IR GENERIC HISTORICAL  05/01/2016   IR ANGIOGRAM SELECTIVE EACH ADDITIONAL VESSEL 05/01/2016 Corrie Mckusick, DO [facility]  . IR GENERIC HISTORICAL  05/01/2016   IR ANGIOGRAM SELECTIVE EACH ADDITIONAL VESSEL 05/01/2016 Corrie Mckusick, DO [facility]  . IR GENERIC HISTORICAL  05/01/2016   IR EMBO TUMOR ORGAN ISCHEMIA INFARCT INC GUIDE ROADMAPPING 05/01/2016 Corrie Mckusick, DO [facility]  . IR GENERIC HISTORICAL  05/01/2016   IR CT SPINE LTD 05/01/2016 Corrie Mckusick, DO [facility]  . IR GENERIC HISTORICAL  05/01/2016   IR US GUIDE VASC ACCESS RIGHT 05/01/2016 Corrie Mckusick, DO [facility]  . IR GENERIC HISTORICAL  05/01/2016   IR ANGIOGRAM SELECTIVE EACH ADDITIONAL VESSEL 05/01/2016 Corrie Mckusick, DO [facility]  . IR RADIOLOGIST EVAL & MGMT  07/31/2016  . IR RADIOLOGIST EVAL & MGMT  12/25/2016  . IR RADIOLOGIST EVAL & MGMT  12/10/2017  . RADIOFREQUENCY ABLATION N/A 06/14/2016   Procedure: LIVER MICROWAVE THERMAL  ABLATION;  Surgeon: Corrie Mckusick, DO;  Location: [facility];  Service: Anesthesiology;  Laterality: N/A;  . TUBAL LIGATION  1978    Allergies: Metformin and related and Welchol [colesevelam hcl]  Medications: Prior to Admission medications   Medication Sig Start Date End Date Taking? Authorizing Provider  Cholecalciferol (VITAMIN D3 PO) Take 5,000 Units by mouth daily.   Yes [provider]  glipiZIDE (GLUCOTROL) 5 MG tablet TAKE 1 TABLET BY MOUTH THREE TIMES DAILY BEFORE MEAL(S) 07/29/17  Yes Unk Pinto, MD  lactulose  (CHRONULAC) 10 GM/15ML solution Take 30 mLs (20 g total) by mouth 3 (three) times daily. Patient taking differently: Take 30 g by mouth daily.  04/03/16  Yes Forcucci, Courtney, PA-C  levothyroxine (SYNTHROID, LEVOTHROID) 150 MCG tablet TAKE 1 TO 1 & 1/2  TABLETS BY MOUTH ONCE DAILY AS DIRECTED Patient taking differently: 1/2 tab daily 03/08/17  Yes Unk Pinto, MD  magnesium oxide (MAG-OX) 400 MG tablet Take 400 mg by mouth daily.   Yes [provider]  ONE TOUCH ULTRA TEST test strip CHECK BLOOD SUGAR ONE TIME DAILY 11/22/17  Yes Liane Comber, NP  ONETOUCH DELICA LANCETS 17C MISC USE TO CHECK BLOOD SUGAR 1 TIME DAILY 10/17/16  Yes Vicie Mutters, PA-C  sitaGLIPtin-metformin (JANUMET) 50-1000 MG tablet Take 1 tablet by mouth 2 (two) times daily with a meal.   Yes [provider]  triamcinolone cream (KENALOG) 0.5 % Apply 1 application topically 2 (two) times daily. 12/27/15  Yes Vicie Mutters, PA-C     Family History  Problem Relation Age of Onset  . Cancer Mother        Brain cancer  . Cancer Father        Prostate cancer  . Diabetes Sister   . Hypertension Sister   . Liver disease Brother   . Diabetes Maternal Aunt   . Heart Problems Maternal Grandfather   . Cancer Paternal Grandfather        Unknown cancer  . Diabetes Sister     Social History   Socioeconomic History  . Marital status: Married    Spouse name: Not on file  . Number of children: Not on file  . Years of education: Not on file  . Highest education level: Not on file  Occupational History  . Not on file  Social Needs  . Financial resource strain: Not on file  . Food insecurity:    Worry: Not on file    Inability: Not on file  . Transportation needs:    Medical: Not on file    Non-medical: Not on file  Tobacco Use  . Smoking status: Never Smoker  . Smokeless tobacco: Never Used  Substance and Sexual Activity  . Alcohol use: No  . Drug use: No  . Sexual activity: Yes    Birth  control/protection: Post-menopausal, Surgical    Comment: Hysterectomy  Lifestyle  . Physical activity:    Days per week: Not on file    Minutes per session: Not on file  . Stress: Not on file  Relationships  . Social connections:    Talks on phone: Not on file    Gets together: Not on file    Attends religious service: Not on file    Active member of club or organization: Not on file    Attends meetings of clubs or organizations: Not on file    Relationship status: Not on file  Other Topics Concern  . Not on file  Social History Narrative  . Not on file  ECOG Status: 0 - Asymptomatic  Review of Systems: A 12 point ROS discussed and pertinent positives are indicated in the HPI above.  All other systems are negative.  Review of Systems  Vital Signs: BP (!) 113/52   Pulse 72   Temp 98.1 F (36.7 C)   Resp 14   Ht _0  (1.575 m)   Wt 67.1 kg   SpO2 99%   BMI 27.07 kg/m   Physical Exam General: 77 yo female appearing stated age.  Well-developed, well-nourished.  No distress. HEENT: Atraumatic, normocephalic.  Conjugate gaze, extra-ocular motor intact. No scleral icterus or scleral injection. No lesions on external ears, nose, lips, or gums.  Oral mucosa moist, pink.  Neck: Symmetric with no goiter enlargement.  Chest/Lungs:  Symmetric chest with inspiration/expiration.  No labored breathing.    Heart:    No JVD appreciated.  Abdomen:  Soft, NT/ND, with + bowel sounds.   Genito-urinary: Deferred Neurologic: Alert & Oriented to person, place, and time.   Normal affect and insight.  Appropriate questions.  Moving all 4 extremities with gross sensory intact.   Mallampati Score:     Imaging: Ct Abdomen Pelvis W Contrast  Result Date: 11/11/2017 CLINICAL DATA:  Status post RFA for hepatocellular carcinoma. EXAM: CT ABDOMEN AND PELVIS WITH CONTRAST TECHNIQUE: Multidetector CT imaging of the abdomen and pelvis was performed using the standard protocol following bolus  administration of intravenous contrast. CONTRAST:  170m ISOVUE-300 IOPAMIDOL (ISOVUE-300) INJECTION 61% COMPARISON:  06/11/2017 FINDINGS: Lower chest: No acute abnormality. Hepatobiliary: Diffuse hepatic steatosis with morphologic features of cirrhosis identified. Ablation zone defect within the dome of the right lobe of liver is again noted. This measures 3.1 by 2.5 cm, image 10/2. Previously 3.1 x 3.1 cm. Along the peripheral margin of the ablation site are several areas of nodular hyperenhancement on the arterial phase. For example, on image 42/4 there is a 1.5 cm area of hyperenhancement. Previously 1.1 cm. Along the lateral margin of the lesion there is a 8 mm area of hyperenhancement. There is no associated pseudo capsule or washout identified on the delayed images. The patient is status post cholecystectomy. Pancreas: Unremarkable. No pancreatic ductal dilatation or surrounding inflammatory changes. Spleen: Prominent spleen measures 11.3 cm in length. No focal splenic abnormality. Adrenals/Urinary Tract: The adrenal glands appear within normal limits. No kidney mass or hydronephrosis. Urinary bladder is normal. The small bowel loops have a normal course and caliber. The appendix is visualized and appears within normal limits. Distal colonic diverticulosis noted without acute inflammation. Stomach/Bowel: Stomach appears normal. Small bowel loops have a normal course and caliber. No obstruction. Unremarkable appearance of the colon. Vascular/Lymphatic: Aortic atherosclerosis. No aneurysm. 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 No adenopathy noted. Reproductive: Status post hysterectomy. No adnexal masses. Other: No free fluid or fluid collections. Musculoskeletal: Spondylosis identified within the lumbar spine. IMPRESSION: 1. Further decrease in size of ablation zone defect along the dome of liver. 2. There are 2 areas of nodular enhancement along the  peripheral margin of the ablation defect which are nonspecific. These appear progressively more conspicuous when compared with 06/11/2017 and 12/25/2016. Although findings may reflect altered perfusion dynamics recurrent tumor cannot be excluded. Advise correlation with alpha fetoprotein levels. Additionally, further imaging with contrast enhanced liver protocol MRI may provide a more sensitive and specific assessment for recurrent tumor. 3. 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 Electronically Signed   By: TQueen SloughD.  On: 11/11/2017 10:56   Mr Liver W UV Contrast  Result Date: 11/25/2017 CLINICAL DATA:  Hepatocellular carcinoma. Previous radiofrequency ablation. Rising AFP level. EXAM: MRI ABDOMEN WITHOUT AND WITH CONTRAST TECHNIQUE: Multiplanar multisequence MR imaging of the abdomen was performed both before and after the administration of intravenous contrast. CONTRAST:  25m MULTIHANCE GADOBENATE DIMEGLUMINE 529 MG/ML IV SOLN COMPARISON:  CT on 11/11/2017 FINDINGS: Lower chest: A sub-cm T2 hyperintense lung nodule is seen in the posterolateral right lower lobe on image 1/5. This portion of the lower lungs were not seen on prior exam. Hepatobiliary: Hepatic cirrhosis is again demonstrated. Prior cholecystectomy. No evidence of biliary obstruction. Ablation defect is seen in the dome of the right hepatic lobe. Several hypervascular nodules are seen along the posterior margin of the ablation defect, largest measuring 14 mm on image 45/1,001. This is suspicious for locally recurrent hepatocellular carcinoma. Prior cholecystectomy. No evidence of biliary obstruction. Pancreas:  No mass or inflammatory changes. Spleen:  Within normal limits in size and appearance. Adrenals/Urinary Tract: No masses identified. No evidence of hydronephrosis. Stomach/Bowel: Visualized portion unremarkable. Vascular/Lymphatic: No pathologically enlarged lymph nodes  identified. No abdominal aortic aneurysm. Left upper quadrant varices are again seen, consistent with portal venous hypertension. 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 Musculoskeletal:  No suspicious bone lesions identified. IMPRESSION: Several hypervascular nodules along the posterior margin of the ablation defect in the dome of the right lobe, largest measuring 14 mm. This is highly suspicious for locally recurrent hepatocellular carcinoma. No evidence of abdominal metastatic disease. Sub-cm right lower lobe pulmonary nodule. Consider further evaluation with chest CT with contrast. Stable hepatic cirrhosis and findings of portal venous hypertension, including large splenorenal varix. Electronically Signed   By: JEarle GellM.D.   On: 11/25/2017 14:17   Ir Radiologist Eval & Mgmt  Result Date: 12/10/2017 Please refer to notes tab for details about interventional procedure. (Op Note)   Labs:  CBC: Recent Labs    08/06/17 1223 11/11/17 0922 11/28/17 1108 12/03/17 1438  WBC 2.6* 3.1* 2.4* 3.0*  HGB 11.5* 12.0 10.8* 11.1*  HCT 32.5* 35.3 31.2* 33.1*  PLT 83* 83* 112* 98*    COAGS: Recent Labs    05/07/17 0916  INR 1.1    BMP: Recent Labs    04/23/17 0921 05/07/17 0916  08/06/17 1223 11/11/17 0922 11/28/17 1108 12/03/17 1438  NA 139 139   < > 139 139 139 138  K 4.6 4.0   < > 4.6 4.2 4.4 3.9  CL 107 107   < > 107 107 105 111*  CO2 27 27   < > _0 GLUCOSE 318* 105*   < > 111* 126* 169* 148*  BUN 10 11   < > _1 CALCIUM 9.1 9.0   < > 9.3 9.7 9.2 10.0  CREATININE 1.17* 0.74   < > 0.71 0.70 0.73 0.80  GFRNONAA 44* 79  --  83  --  80  --   GFRAA 51* 92  --  97  --  93  --    < > = values in this interval not displayed.    LIVER FUNCTION TESTS: Recent Labs    04/23/17 0921  06/11/17 0943 08/06/17 1223 11/11/17 0922 11/28/17 1108 12/03/17 1438  BILITOT 1.3*   < > 1.3 1.4* 1.7* 1.2 1.4  AST 48*   < > 56* 49* 64* 45* 68*    ALT 38   < >  49* 36* 48* 34* 49*  ALKPHOS 90  --  82  --  76  --  88*  PROT 6.2*   < > 6.9 6.4 7.0 6.2 6.5  ALBUMIN 3.0*  --  3.4*  --  3.6  --  3.1*   < > = values in this interval not displayed.    TUMOR MARKERS: No results for input(s): AFPTM, CEA, CA199, CHROMGRNA in the last 8760 hours.  Assessment and Plan:  Beverly Hew is a 77 year old female with cirrhosis and treated [place], now with evidence of [place] recurrence in the right liver.    Today's visit was spent mostly discussing her imaging, her baseline risk for development of [place] within cirrhotic liver, and treatment options.    I did review with her surgical options, which would be the only curative options, of liver transplant and surgical resection/hepatectomy.  I did remind her that she is not a surgical candidate for cure, given the increased peri-surgical mortality portended by her child-pugh category, and that we are offering palliation of her disease.   We also discussed active surveillance as a strategy, as the imaging findings show small new lesions of only a few mm's to the largest of 1.5cm.  She is more interested in treatment.   My impression is that CT image guided ablation with microwave is the most reasonable approach, targeting the 1.5cm lesion.  I did let her know that our plan would be only to address the 1.5cm lesion, and that we would observe the other areas, as it is unpredictable whether these other small lesions could be feasibly treated by microwave at this time.    Risks again discussed include: bleeding, infection, local injury including bowel, skin, kidney, blood vessels, need for further surgery/procedure, hospitalization, transfusion, cardiopulmonary collapse, death.    Again, she knows she will require imaging surveillance not only after our procedure, but in the future to assure that we observe the other 3 small lesions, as well as observe for any other new lesions. .   She would like to proceed with  treatment.   Plan: - Plan to proceed with CT guided microwave ablation of recurrent HCC, right lobe, [facility] with Dr. Earleen Newport -She has concern to make sure that she does not have another eye injury during anesthesia on this treatment, as it seems she experienced a corneal abrasion on the previously.   - I have advised her to observe all of her other follow up appointments.    Electronically Signed: Corrie Mckusick 12/10/2017, 5:13 PM   I spent a total of    25 Minutes in face to face in clinical consultation, greater than 50% of which was counseling/coordinating care for recurrent HCC, possible image guided microwave ablation.

## 2017-12-15 ENCOUNTER — Other Ambulatory Visit: Payer: Self-pay | Admitting: Internal Medicine

## 2017-12-15 ENCOUNTER — Other Ambulatory Visit (HOSPITAL_COMMUNITY): Payer: Self-pay | Admitting: Interventional Radiology

## 2017-12-15 DIAGNOSIS — C220 Liver cell carcinoma: Secondary | ICD-10-CM

## 2017-12-17 ENCOUNTER — Ambulatory Visit (INDEPENDENT_AMBULATORY_CARE_PROVIDER_SITE_OTHER): Payer: PPO | Admitting: *Deleted

## 2017-12-17 DIAGNOSIS — D649 Anemia, unspecified: Secondary | ICD-10-CM

## 2017-12-17 DIAGNOSIS — Z23 Encounter for immunization: Secondary | ICD-10-CM | POA: Diagnosis not present

## 2017-12-17 DIAGNOSIS — E039 Hypothyroidism, unspecified: Secondary | ICD-10-CM

## 2017-12-17 LAB — CBC WITH DIFFERENTIAL/PLATELET
BASOS ABS: 19 {cells}/uL (ref 0–200)
BASOS PCT: 0.8 %
Eosinophils Absolute: 79 cells/uL (ref 15–500)
Eosinophils Relative: 3.3 %
HEMATOCRIT: 30.9 % — AB (ref 35.0–45.0)
HEMOGLOBIN: 10.6 g/dL — AB (ref 11.7–15.5)
Lymphs Abs: 607 cells/uL — ABNORMAL LOW (ref 850–3900)
MCH: 36.9 pg — AB (ref 27.0–33.0)
MCHC: 34.3 g/dL (ref 32.0–36.0)
MCV: 107.7 fL — AB (ref 80.0–100.0)
MONOS PCT: 13.3 %
MPV: 11.1 fL (ref 7.5–12.5)
NEUTROS PCT: 57.3 %
Neutro Abs: 1375 cells/uL — ABNORMAL LOW (ref 1500–7800)
PLATELETS: 81 10*3/uL — AB (ref 140–400)
RBC: 2.87 10*6/uL — AB (ref 3.80–5.10)
RDW: 13 % (ref 11.0–15.0)
Total Lymphocyte: 25.3 %
WBC mixed population: 319 cells/uL (ref 200–950)
WBC: 2.4 10*3/uL — ABNORMAL LOW (ref 3.8–10.8)

## 2017-12-17 LAB — TSH: TSH: 0.65 m[IU]/L (ref 0.40–4.50)

## 2017-12-17 NOTE — Progress Notes (Signed)
Patient is here to recheck her TSH and CBC, per Dr Melford Aase.  She is currently taking Levothyroxine 150.mcg, a whole tablet on MWF and 1/2 tablet the other 4 days of the week.

## 2017-12-25 ENCOUNTER — Other Ambulatory Visit: Payer: Self-pay | Admitting: Physician Assistant

## 2018-01-06 NOTE — Progress Notes (Signed)
CBCDIFF 9-21-5-19 Epic   EKG 05-07-17 Epic

## 2018-01-06 NOTE — Patient Instructions (Signed)
Beverly Davis  01/06/2018   Your procedure is scheduled on: 01-14-18   Report to [facility] Main  Entrance     Report to admitting at 6:30AM    Call this number if you have problems the morning of surgery [phone]     Remember: Do not eat food or drink liquids :After Midnight. BRUSH YOUR TEETH MORNING OF SURGERY AND RINSE YOUR MOUTH OUT, NO CHEWING GUM CANDY OR MINTS.     Take these medicines the morning of surgery with A SIP OF WATER: LACTULOSE, LEVOTHYROXINE     DO NOT TAKE ANY DIABETIC MEDICATIONS DAY OF YOUR SURGERY!!!!                               You may not have any metal on your body including hair pins and              piercings  Do not wear jewelry, make-up, lotions, powders or perfumes, deodorant             Do not wear nail polish.  Do not shave  48 hours prior to surgery.     Do not bring valuables to the hospital. [place].  Contacts, dentures or bridgework may not be worn into surgery.  Leave suitcase in the car. After surgery it may be brought to your room.                   Please read over the following fact sheets you were given: _____________________________________________________________________               [facility] - Preparing for Surgery Before surgery, you can play an important role.  Because skin is not sterile, your skin needs to be as free of germs as possible.  You can reduce the number of germs on your skin by washing with CHG (chlorahexidine gluconate) soap before surgery.  CHG is an antiseptic cleaner which kills germs and bonds with the skin to continue killing germs even after washing. Please DO NOT use if you have an allergy to CHG or antibacterial soaps.  If your skin becomes reddened/irritated stop using the CHG and inform your nurse when you arrive at Short Stay. Do not shave (including legs and underarms) for at least 48 hours prior to the first  CHG shower.  You may shave your face/neck. Please follow these instructions carefully:  1.  Shower with CHG Soap the night before surgery and the  morning of Surgery.  2.  If you choose to wash your hair, wash your hair first as usual with your  normal  shampoo.  3.  After you shampoo, rinse your hair and body thoroughly to remove the  shampoo.                           4.  Use CHG as you would any other liquid soap.  You can apply chg directly  to the skin and wash                       Gently with a scrungie or clean washcloth.  5.  Apply the CHG Soap to your body  ONLY FROM THE NECK DOWN.   Do not use on face/ open                           Wound or open sores. Avoid contact with eyes, ears mouth and genitals (private parts).                       Wash face,  Genitals (private parts) with your normal soap.             6.  Wash thoroughly, paying special attention to the area where your surgery  will be performed.  7.  Thoroughly rinse your body with warm water from the neck down.  8.  DO NOT shower/wash with your normal soap after using and rinsing off  the CHG Soap.                9.  Pat yourself dry with a clean towel.            10.  Wear clean pajamas.            11.  Place clean sheets on your bed the night of your first shower and do not  sleep with pets. Day of Surgery : Do not apply any lotions/deodorants the morning of surgery.  Please wear clean clothes to the hospital/surgery center.  FAILURE TO FOLLOW THESE INSTRUCTIONS MAY RESULT IN THE CANCELLATION OF YOUR SURGERY PATIENT SIGNATURE_________________________________  NURSE SIGNATURE__________________________________  ________________________________________________________________________

## 2018-01-09 ENCOUNTER — Encounter (HOSPITAL_COMMUNITY): Payer: Self-pay

## 2018-01-09 ENCOUNTER — Other Ambulatory Visit: Payer: Self-pay

## 2018-01-09 ENCOUNTER — Encounter (HOSPITAL_COMMUNITY)
Admission: RE | Admit: 2018-01-09 | Discharge: 2018-01-09 | Disposition: A | Discharge status: Home / Self Care | Payer: PPO | Source: Ambulatory Visit | Attending: Interventional Radiology | Admitting: Interventional Radiology

## 2018-01-09 DIAGNOSIS — Z01812 Encounter for preprocedural laboratory examination: Secondary | ICD-10-CM | POA: Insufficient documentation

## 2018-01-09 LAB — HEMOGLOBIN A1C
Hgb A1c MFr Bld: 6.9 % — ABNORMAL HIGH (ref 4.8–5.6)
Mean Plasma Glucose: 151.33 mg/dL

## 2018-01-09 LAB — COMPREHENSIVE METABOLIC PANEL
ALK PHOS: 80 U/L (ref 38–126)
ALT: 46 U/L — AB (ref 0–44)
AST: 58 U/L — ABNORMAL HIGH (ref 15–41)
Albumin: 3.1 g/dL — ABNORMAL LOW (ref 3.5–5.0)
Anion gap: 7 (ref 5–15)
BUN: 17 mg/dL (ref 8–23)
CALCIUM: 9.7 mg/dL (ref 8.9–10.3)
CHLORIDE: 110 mmol/L (ref 98–111)
CO2: 22 mmol/L (ref 22–32)
CREATININE: 0.68 mg/dL (ref 0.44–1.00)
Glucose, Bld: 286 mg/dL — ABNORMAL HIGH (ref 70–99)
Potassium: 4.4 mmol/L (ref 3.5–5.1)
SODIUM: 139 mmol/L (ref 135–145)
Total Bilirubin: 1.4 mg/dL — ABNORMAL HIGH (ref 0.3–1.2)
Total Protein: 6.2 g/dL — ABNORMAL LOW (ref 6.5–8.1)

## 2018-01-09 LAB — GLUCOSE, CAPILLARY: GLUCOSE-CAPILLARY: 272 mg/dL — AB (ref 70–99)

## 2018-01-14 ENCOUNTER — Ambulatory Visit (HOSPITAL_COMMUNITY): Admission: RE | Admit: 2018-01-14 | Payer: PPO | Source: Ambulatory Visit

## 2018-01-14 NOTE — Progress Notes (Signed)
SPOKE WITH PATIENT , PATIENT GIVEN THE INSTRUCTIONS BELOW!               Beverly Davis  01/14/2018   Your procedure is scheduled on: 01-21-18  Report to [facility] Main  Entrance  Report to admitting at 10:00AM    Call this number if you have problems the morning of surgery [phone]    Remember: Do not eat food or drink liquids :After Midnight. BRUSH YOUR TEETH MORNING OF SURGERY AND RINSE YOUR MOUTH OUT, NO CHEWING GUM CANDY OR MINTS.     Take these medicines the morning of surgery with A SIP OF WATER: levothyroxine   DO NOT TAKE ANY DIABETIC MEDICATIONS DAY OF YOUR SURGERY!!!!                               You may not have any metal on your body including hair pins and              piercings  Do not wear jewelry, make-up, lotions, powders or perfumes, deodorant             Do not wear nail polish.  Do not shave  48 hours prior to surgery.              Men may shave face and neck.   Do not bring valuables to the hospital. [place].  Contacts, dentures or bridgework may not be worn into surgery.  Leave suitcase in the car. After surgery it may be brought to your room.                 Please read over the following fact sheets you were given: _____________________________________________________________________             [facility] - Preparing for Surgery Before surgery, you can play an important role.  Because skin is not sterile, your skin needs to be as free of germs as possible.  You can reduce the number of germs on your skin by washing with CHG (chlorahexidine gluconate) soap before surgery.  CHG is an antiseptic cleaner which kills germs and bonds with the skin to continue killing germs even after washing. Please DO NOT use if you have an allergy to CHG or antibacterial soaps.  If your skin becomes reddened/irritated stop using the CHG and inform your nurse when you arrive at Short Stay. Do not shave  (including legs and underarms) for at least 48 hours prior to the first CHG shower.  You may shave your face/neck. Please follow these instructions carefully:  1.  Shower with CHG Soap the night before surgery and the  morning of Surgery.  2.  If you choose to wash your hair, wash your hair first as usual with your  normal  shampoo.  3.  After you shampoo, rinse your hair and body thoroughly to remove the  shampoo.                           4.  Use CHG as you would any other liquid soap.  You can apply chg directly  to the skin and wash                       Gently with a scrungie  or clean washcloth.  5.  Apply the CHG Soap to your body ONLY FROM THE NECK DOWN.   Do not use on face/ open                           Wound or open sores. Avoid contact with eyes, ears mouth and genitals (private parts).                       Wash face,  Genitals (private parts) with your normal soap.             6.  Wash thoroughly, paying special attention to the area where your surgery  will be performed.  7.  Thoroughly rinse your body with warm water from the neck down.  8.  DO NOT shower/wash with your normal soap after using and rinsing off  the CHG Soap.                9.  Pat yourself dry with a clean towel.            10.  Wear clean pajamas.            11.  Place clean sheets on your bed the night of your first shower and do not  sleep with pets. Day of Surgery : Do not apply any lotions/deodorants the morning of surgery.  Please wear clean clothes to the hospital/surgery center.  FAILURE TO FOLLOW THESE INSTRUCTIONS MAY RESULT IN THE CANCELLATION OF YOUR SURGERY PATIENT SIGNATURE_________________________________  NURSE SIGNATURE__________________________________  ________________________________________________________________________

## 2018-01-19 ENCOUNTER — Other Ambulatory Visit: Payer: Self-pay | Admitting: Radiology

## 2018-01-21 ENCOUNTER — Ambulatory Visit (HOSPITAL_COMMUNITY)
Admission: RE | Admit: 2018-01-21 | Discharge: 2018-01-21 | Disposition: A | Discharge status: Home / Self Care | Payer: PPO | Source: Ambulatory Visit | Attending: Interventional Radiology | Admitting: Interventional Radiology

## 2018-01-21 ENCOUNTER — Encounter (HOSPITAL_COMMUNITY)
Admission: RE | Disposition: A | Discharge status: Home / Self Care | Payer: Self-pay | Source: Ambulatory Visit | Attending: Interventional Radiology

## 2018-01-21 ENCOUNTER — Encounter (HOSPITAL_COMMUNITY): Payer: Self-pay

## 2018-01-21 ENCOUNTER — Telehealth (HOSPITAL_COMMUNITY): Payer: Self-pay | Admitting: *Deleted

## 2018-01-21 ENCOUNTER — Ambulatory Visit (HOSPITAL_COMMUNITY): Payer: PPO | Admitting: Certified Registered Nurse Anesthetist

## 2018-01-21 ENCOUNTER — Ambulatory Visit (HOSPITAL_COMMUNITY)
Admission: RE | Admit: 2018-01-21 | Discharge: 2018-01-22 | Disposition: A | Discharge status: Home / Self Care | Payer: PPO | Source: Ambulatory Visit | Attending: Interventional Radiology | Admitting: Interventional Radiology

## 2018-01-21 ENCOUNTER — Ambulatory Visit (HOSPITAL_COMMUNITY): Payer: PPO

## 2018-01-21 ENCOUNTER — Other Ambulatory Visit: Payer: Self-pay

## 2018-01-21 DIAGNOSIS — C220 Liver cell carcinoma: Secondary | ICD-10-CM | POA: Diagnosis present

## 2018-01-21 DIAGNOSIS — Z7984 Long term (current) use of oral hypoglycemic drugs: Secondary | ICD-10-CM | POA: Diagnosis not present

## 2018-01-21 DIAGNOSIS — Z03818 Encounter for observation for suspected exposure to other biological agents ruled out: Secondary | ICD-10-CM | POA: Diagnosis not present

## 2018-01-21 DIAGNOSIS — K7460 Unspecified cirrhosis of liver: Secondary | ICD-10-CM | POA: Insufficient documentation

## 2018-01-21 DIAGNOSIS — E119 Type 2 diabetes mellitus without complications: Secondary | ICD-10-CM | POA: Diagnosis not present

## 2018-01-21 DIAGNOSIS — Z01818 Encounter for other preprocedural examination: Secondary | ICD-10-CM

## 2018-01-21 DIAGNOSIS — E1151 Type 2 diabetes mellitus with diabetic peripheral angiopathy without gangrene: Secondary | ICD-10-CM | POA: Diagnosis not present

## 2018-01-21 DIAGNOSIS — K7581 Nonalcoholic steatohepatitis (NASH): Secondary | ICD-10-CM | POA: Insufficient documentation

## 2018-01-21 DIAGNOSIS — E039 Hypothyroidism, unspecified: Secondary | ICD-10-CM | POA: Diagnosis not present

## 2018-01-21 DIAGNOSIS — D696 Thrombocytopenia, unspecified: Secondary | ICD-10-CM | POA: Diagnosis not present

## 2018-01-21 DIAGNOSIS — I10 Essential (primary) hypertension: Secondary | ICD-10-CM | POA: Insufficient documentation

## 2018-01-21 DIAGNOSIS — M8580 Other specified disorders of bone density and structure, unspecified site: Secondary | ICD-10-CM | POA: Insufficient documentation

## 2018-01-21 HISTORY — PX: RADIOLOGY WITH ANESTHESIA: SHX6223

## 2018-01-21 LAB — CBC WITH DIFFERENTIAL/PLATELET
Abs Immature Granulocytes: 0.01 10*3/uL (ref 0.00–0.07)
BASOS ABS: 0 10*3/uL (ref 0.0–0.1)
BASOS PCT: 0 %
Eosinophils Absolute: 0.1 10*3/uL (ref 0.0–0.5)
Eosinophils Relative: 2 %
HEMATOCRIT: 30.9 % — AB (ref 36.0–46.0)
Hemoglobin: 10.4 g/dL — ABNORMAL LOW (ref 12.0–15.0)
IMMATURE GRANULOCYTES: 0 %
LYMPHS ABS: 0.6 10*3/uL — AB (ref 0.7–4.0)
Lymphocytes Relative: 22 %
MCH: 37.4 pg — ABNORMAL HIGH (ref 26.0–34.0)
MCHC: 33.7 g/dL (ref 30.0–36.0)
MCV: 111.2 fL — AB (ref 80.0–100.0)
MONOS PCT: 11 %
Monocytes Absolute: 0.3 10*3/uL (ref 0.1–1.0)
NEUTROS ABS: 1.8 10*3/uL (ref 1.7–7.7)
NEUTROS PCT: 65 %
PLATELETS: 85 10*3/uL — AB (ref 150–400)
RBC: 2.78 MIL/uL — ABNORMAL LOW (ref 3.87–5.11)
RDW: 13.6 % (ref 11.5–15.5)
WBC: 2.8 10*3/uL — ABNORMAL LOW (ref 4.0–10.5)
nRBC: 0 % (ref 0.0–0.2)

## 2018-01-21 LAB — COMPREHENSIVE METABOLIC PANEL
ALT: 47 U/L — ABNORMAL HIGH (ref 0–44)
AST: 64 U/L — AB (ref 15–41)
Albumin: 3.6 g/dL (ref 3.5–5.0)
Alkaline Phosphatase: 79 U/L (ref 38–126)
Anion gap: 8 (ref 5–15)
BILIRUBIN TOTAL: 1.4 mg/dL — AB (ref 0.3–1.2)
BUN: 15 mg/dL (ref 8–23)
CHLORIDE: 109 mmol/L (ref 98–111)
CO2: 21 mmol/L — ABNORMAL LOW (ref 22–32)
Calcium: 8.9 mg/dL (ref 8.9–10.3)
Creatinine, Ser: 0.6 mg/dL (ref 0.44–1.00)
GFR calc Af Amer: >60 mL/min (ref >60)
Glucose, Bld: 161 mg/dL — ABNORMAL HIGH (ref 70–99)
POTASSIUM: 4.3 mmol/L (ref 3.5–5.1)
Sodium: 138 mmol/L (ref 135–145)
TOTAL PROTEIN: 6.5 g/dL (ref 6.5–8.1)

## 2018-01-21 LAB — PROTIME-INR
INR: 1.11
PROTHROMBIN TIME: 14.2 s (ref 11.4–15.2)

## 2018-01-21 LAB — GLUCOSE, CAPILLARY: Glucose-Capillary: 147 mg/dL — ABNORMAL HIGH (ref 70–99)

## 2018-01-21 SURGERY — CT WITH ANESTHESIA
Anesthesia: General | Laterality: Right

## 2018-01-21 MED ORDER — SODIUM CHLORIDE 0.9 % IV SOLN
INTRAVENOUS | Status: DC | PRN
  Administered 2018-01-21: 50 ug/min via INTRAVENOUS

## 2018-01-21 MED ORDER — MIDAZOLAM HCL 2 MG/2ML IJ SOLN
INTRAMUSCULAR | Status: AC
  Filled 2018-01-21: qty 2

## 2018-01-21 MED ORDER — OXYCODONE HCL 5 MG/5ML PO SOLN
5.0000 mg | Freq: Once | ORAL | Status: DC | PRN
  Filled 2018-01-21: qty 5

## 2018-01-21 MED ORDER — FENTANYL CITRATE (PF) 250 MCG/5ML IJ SOLN
INTRAMUSCULAR | Status: AC
  Filled 2018-01-21: qty 5

## 2018-01-21 MED ORDER — SENNOSIDES-DOCUSATE SODIUM 8.6-50 MG PO TABS: 1.0000 | ORAL_TABLET | Freq: Every day | ORAL | Status: DC | PRN

## 2018-01-21 MED ORDER — PIPERACILLIN-TAZOBACTAM 3.375 G IVPB
INTRAVENOUS | Status: AC
  Filled 2018-01-21: qty 50

## 2018-01-21 MED ORDER — PHENYLEPHRINE 40 MCG/ML (10ML) SYRINGE FOR IV PUSH (FOR BLOOD PRESSURE SUPPORT)
PREFILLED_SYRINGE | INTRAVENOUS | Status: DC | PRN
  Administered 2018-01-21 (×4): 80 ug via INTRAVENOUS

## 2018-01-21 MED ORDER — IOPAMIDOL (ISOVUE-370) INJECTION 76%
100.0000 mL | Freq: Once | INTRAVENOUS | Status: AC | PRN
  Administered 2018-01-21: 100 mL via INTRAVENOUS

## 2018-01-21 MED ORDER — SODIUM CHLORIDE 0.9 % IV SOLN
INTRAVENOUS | Status: AC
  Administered 2018-01-21: 19:00:00 via INTRAVENOUS

## 2018-01-21 MED ORDER — FENTANYL CITRATE (PF) 250 MCG/5ML IJ SOLN
INTRAMUSCULAR | Status: DC | PRN
  Administered 2018-01-21 (×3): 50 ug via INTRAVENOUS
  Administered 2018-01-21: 100 ug via INTRAVENOUS

## 2018-01-21 MED ORDER — ACETAMINOPHEN 500 MG PO TABS: 1000.0000 mg | ORAL_TABLET | Freq: Once | ORAL | Status: DC | PRN

## 2018-01-21 MED ORDER — ONDANSETRON HCL 4 MG/2ML IJ SOLN: 4.0000 mg | Freq: Four times a day (QID) | INTRAMUSCULAR | Status: DC | PRN

## 2018-01-21 MED ORDER — HYDROCODONE-ACETAMINOPHEN 5-325 MG PO TABS
1.0000 | ORAL_TABLET | ORAL | Status: DC | PRN
  Administered 2018-01-21 (×2): 1 via ORAL
  Administered 2018-01-22: 2 via ORAL
  Administered 2018-01-22: 1 via ORAL
  Filled 2018-01-21: qty 1
  Filled 2018-01-21: qty 2
  Filled 2018-01-21 (×2): qty 1

## 2018-01-21 MED ORDER — ACETAMINOPHEN 10 MG/ML IV SOLN: 1000.0000 mg | Freq: Once | INTRAVENOUS | Status: DC | PRN

## 2018-01-21 MED ORDER — PIPERACILLIN-TAZOBACTAM 3.375 G IVPB
3.3750 g | Freq: Once | INTRAVENOUS | Status: AC
  Administered 2018-01-21: 3.375 g via INTRAVENOUS

## 2018-01-21 MED ORDER — OXYCODONE HCL 5 MG PO TABS: 5.0000 mg | ORAL_TABLET | Freq: Once | ORAL | Status: DC | PRN

## 2018-01-21 MED ORDER — KETOROLAC TROMETHAMINE 0.5 % OP SOLN
1.0000 [drp] | Freq: Three times a day (TID) | OPHTHALMIC | Status: DC | PRN
  Administered 2018-01-21: 1 [drp] via OPHTHALMIC
  Filled 2018-01-21: qty 5

## 2018-01-21 MED ORDER — ONDANSETRON HCL 4 MG/2ML IJ SOLN
INTRAMUSCULAR | Status: DC | PRN
  Administered 2018-01-21: 4 mg via INTRAVENOUS

## 2018-01-21 MED ORDER — BSS IO SOLN
15.0000 mL | Freq: Once | INTRAOCULAR | Status: AC
  Administered 2018-01-21: 15 mL
  Filled 2018-01-21: qty 15

## 2018-01-21 MED ORDER — PROPOFOL 10 MG/ML IV BOLUS
INTRAVENOUS | Status: DC | PRN
  Administered 2018-01-21: 150 mg via INTRAVENOUS

## 2018-01-21 MED ORDER — IOPAMIDOL (ISOVUE-370) INJECTION 76%
INTRAVENOUS | Status: AC
  Filled 2018-01-21: qty 100

## 2018-01-21 MED ORDER — DEXAMETHASONE SODIUM PHOSPHATE 10 MG/ML IJ SOLN
INTRAMUSCULAR | Status: DC | PRN
  Administered 2018-01-21: 5 mg via INTRAVENOUS

## 2018-01-21 MED ORDER — FENTANYL CITRATE (PF) 100 MCG/2ML IJ SOLN: 25.0000 ug | INTRAMUSCULAR | Status: DC | PRN

## 2018-01-21 MED ORDER — SODIUM CHLORIDE 0.9 % IJ SOLN
INTRAMUSCULAR | Status: AC
  Filled 2018-01-21: qty 50

## 2018-01-21 MED ORDER — LIDOCAINE 2% (20 MG/ML) 5 ML SYRINGE
INTRAMUSCULAR | Status: DC | PRN
  Administered 2018-01-21: 60 mg via INTRAVENOUS

## 2018-01-21 MED ORDER — DOCUSATE SODIUM 100 MG PO CAPS
100.0000 mg | ORAL_CAPSULE | Freq: Two times a day (BID) | ORAL | Status: DC
  Administered 2018-01-21 – 2018-01-22 (×2): 100 mg via ORAL
  Filled 2018-01-21 (×2): qty 1

## 2018-01-21 MED ORDER — ROCURONIUM BROMIDE 10 MG/ML (PF) SYRINGE
PREFILLED_SYRINGE | INTRAVENOUS | Status: DC | PRN
  Administered 2018-01-21 (×2): 50 mg via INTRAVENOUS

## 2018-01-21 MED ORDER — ACETAMINOPHEN 160 MG/5ML PO SOLN: 1000.0000 mg | Freq: Once | ORAL | Status: DC | PRN

## 2018-01-21 MED ORDER — SUGAMMADEX SODIUM 200 MG/2ML IV SOLN
INTRAVENOUS | Status: DC | PRN
  Administered 2018-01-21: 300 mg via INTRAVENOUS

## 2018-01-21 MED ORDER — MIDAZOLAM HCL 5 MG/5ML IJ SOLN
INTRAMUSCULAR | Status: DC | PRN
  Administered 2018-01-21: 2 mg via INTRAVENOUS

## 2018-01-21 MED ORDER — LACTATED RINGERS IV SOLN
INTRAVENOUS | Status: DC
  Administered 2018-01-21: 11:00:00 via INTRAVENOUS

## 2018-01-21 NOTE — Anesthesia Procedure Notes (Signed)
Procedure Name: Intubation Date/Time: 01/21/2018 12:43 PM Performed by: Mitzie Na, CRNA Pre-anesthesia Checklist: Patient identified, Emergency Drugs available, Suction available, Patient being monitored and Timeout performed Patient Re-evaluated:Patient Re-evaluated prior to induction Oxygen Delivery Method: Circle system utilized Preoxygenation: Pre-oxygenation with 100% oxygen Induction Type: IV induction Ventilation: Mask ventilation without difficulty Laryngoscope Size: Mac Grade View: Grade I Tube type: Oral Tube size: 7.0 mm Number of attempts: 1 Airway Equipment and Method: Stylet Placement Confirmation: ETT inserted through vocal cords under direct vision,  positive ETCO2 and breath sounds checked- equal and bilateral Secured at: 24 cm Tube secured with: Tape Dental Injury: Teeth and Oropharynx as per pre-operative assessment

## 2018-01-21 NOTE — H&P (Signed)
Referring Physician(s): Ennever,P  Supervising Physician: Corrie Mckusick  Patient Status:  [facility] OP TBA  Chief Complaint:  Hepatocellular carcinoma  Subjective: Patient familiar to IR service from prior hepatocellular carcinoma bland embolization (segment 8) in February 2018 followed by radiofrequency ablation of same region in March 2018.  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  She now has imaging evidence of recurrence of [place] in the right lobe with 4 separate foci, largest measuring 1.5 cm inferior to the prior treatment site.  Following discussion with Dr. Earleen Newport on 12/10/2017 she presents today for repeat CT-guided microwave ablation of the 1.5 cm HCC recurrence.  He currently denies fever, headache, chest pain, worsening dyspnea, back pain, nausea, vomiting or bleeding.  She does have occasional cough, occ epigastric discomfort and is anxious.  Past Medical History:  Diagnosis Date  . Breast cancer ([place]) 05/2006   Right breast cancer  . Cirrhosis ([place])   . Diabetes mellitus without complication ([place])    type 2  . Fibroids    PT IS UNAWARE   . Hepatocellular carcinoma ([place]) 05/08/2016  . Hypothyroidism   . Osteopenia   . Personal history of radiation therapy 2008  . Thrombocytopenia ([place])   . Thyroid disease    Past Surgical History:  Procedure Laterality Date  . ABDOMINAL HYSTERECTOMY     TAH BSO  . BREAST LUMPECTOMY Right 2008  . BREAST LUMPECTOMY WITH AXILLARY LYMPH NODE BIOPSY Right 06/25/2006  . CHOLECYSTECTOMY    . IR GENERIC HISTORICAL  03/28/2016   IR RADIOLOGIST EVAL & MGMT 03/28/2016 Corrie Mckusick, DO [facility] INTERV RAD  . IR GENERIC HISTORICAL  05/01/2016   IR ANGIOGRAM VISCERAL SELECTIVE 05/01/2016 Corrie Mckusick, DO [facility]  . IR GENERIC HISTORICAL  05/01/2016   IR ANGIOGRAM VISCERAL SELECTIVE 05/01/2016 Corrie Mckusick, DO [facility]  . IR GENERIC HISTORICAL  05/01/2016   IR ANGIOGRAM SELECTIVE EACH ADDITIONAL VESSEL 05/01/2016  Corrie Mckusick, DO [facility]  . IR GENERIC HISTORICAL  05/01/2016   IR ANGIOGRAM SELECTIVE EACH ADDITIONAL VESSEL 05/01/2016 Corrie Mckusick, DO [facility]  . IR GENERIC HISTORICAL  05/01/2016   IR ANGIOGRAM SELECTIVE EACH ADDITIONAL VESSEL 05/01/2016 Corrie Mckusick, DO [facility]  . IR GENERIC HISTORICAL  05/01/2016   IR EMBO TUMOR ORGAN ISCHEMIA INFARCT INC GUIDE ROADMAPPING 05/01/2016 Corrie Mckusick, DO [facility]  . IR GENERIC HISTORICAL  05/01/2016   IR CT SPINE LTD 05/01/2016 Corrie Mckusick, DO [facility]  . IR GENERIC HISTORICAL  05/01/2016   IR US GUIDE VASC ACCESS RIGHT 05/01/2016 Corrie Mckusick, DO [facility]  . IR GENERIC HISTORICAL  05/01/2016   IR ANGIOGRAM SELECTIVE EACH ADDITIONAL VESSEL 05/01/2016 Corrie Mckusick, DO [facility]  . IR RADIOLOGIST EVAL & MGMT  07/31/2016  . IR RADIOLOGIST EVAL & MGMT  12/25/2016  . IR RADIOLOGIST EVAL & MGMT  12/10/2017  . RADIOFREQUENCY ABLATION N/A 06/14/2016   Procedure: LIVER MICROWAVE THERMAL  ABLATION;  Surgeon: Corrie Mckusick, DO;  Location: [facility] ORS;  Service: Anesthesiology;  Laterality: N/A;  . TUBAL LIGATION  1978      Allergies: Metformin and related and Welchol [colesevelam hcl]  Medications: Prior to Admission medications   Medication Sig Start Date End Date Taking? Authorizing Provider  Cholecalciferol (VITAMIN D3) 5000 units CAPS Take 5,000 Units by mouth daily.   Yes [provider]  glipiZIDE (GLUCOTROL) 5 MG tablet TAKE 1 TABLET BY MOUTH THREE TIMES DAILY BEFORE MEAL(S) Patient  taking differently: Take 5 mg by mouth 3 (three) times daily before meals.  07/29/17  Yes Unk Pinto, MD  lactulose (CHRONULAC) 10 GM/15ML solution Take 30 mLs (20 g total) by mouth 3 (three) times daily. Patient taking differently: Take 13.3 g by mouth daily.  04/03/16  Yes Rolene Course, PA-C  levothyroxine (SYNTHROID, LEVOTHROID) 150 MCG tablet TAKE 1 TO 1 & 1/2 (ONE TO ONE & ONE-HALF) TABLETS BY MOUTH ONCE DAILY AS DIRECTED Patient taking differently:  Take 75-150 mcg by mouth See admin instructions. Take 150 mcg by mouth daily on Monday, Wednesday and Friday. Take 75 mcg by mouth daily on all other days. 12/15/17  Yes Unk Pinto, MD  magnesium oxide (MAG-OX) 400 MG tablet Take 400 mg by mouth 3 (three) times daily.    Yes [provider]  SitaGLIPtin-MetFORMIN HCl (JANUMET XR) 50-1000 MG TB24 Take 1 tablet by mouth 2 (two) times daily.   Yes [provider]  ONE TOUCH ULTRA TEST test strip CHECK BLOOD SUGAR ONE TIME DAILY Patient not taking: Reported on 01/05/2018 11/22/17   Liane Comber, NP  [facility] DELICA LANCETS 42H MISC USE TO CHECK BLOOD SUGAR ONE TIME DAILY Patient not taking: Reported on 01/05/2018 12/25/17   Unk Pinto, MD  triamcinolone cream (KENALOG) 0.5 % Apply 1 application topically 2 (two) times daily. Patient not taking: Reported on 01/05/2018 12/27/15   Vicie Mutters, PA-C     Vital Signs: Blood pressure 115/63, heart rate 68, temp 97.7, respirations 16, O2 sat 98% room air   Physical Exam awake, alert.  Chest with clear breath sounds bilaterally.  Heart with regular rate and rhythm, positive murmur.  Abdomen soft, positive bowel sounds, some mildly tender epigastric tenderness to palpation.  Trace pretibial edema bilaterally.  Imaging: No results found.  Labs:  CBC: Recent Labs    11/11/17 0922 11/28/17 1108 12/03/17 1438 12/17/17 1529  WBC 3.1* 2.4* 3.0* 2.4*  HGB 12.0 10.8* 11.1* 10.6*  HCT 35.3 31.2* 33.1* 30.9*  PLT 83* 112* 98* 81*    COAGS: Recent Labs    05/07/17 0916  INR 1.1    BMP: Recent Labs    05/07/17 0916  08/06/17 1223 11/11/17 0922 11/28/17 1108 12/03/17 1438 01/09/18 1205  NA 139   < > 139 139 139 138 139  K 4.0   < > 4.6 4.2 4.4 3.9 4.4  CL 107   < > 107 107 105 111* 110  CO2 27   < > 25 27 25 27 22   GLUCOSE 105*   < > 111* 126* 169* 148* 286*  BUN 11   < > 12 11 11 13 17   CALCIUM 9.0   < > 9.3 9.7 9.2 10.0 9.7  CREATININE 0.74   < > 0.71  0.70 0.73 0.80 0.68  GFRNONAA 79  --  83  --  80  --  >60  GFRAA 92  --  97  --  93  --  >60   < > = values in this interval not displayed.    LIVER FUNCTION TESTS: Recent Labs    06/11/17 0943  11/11/17 0922 11/28/17 1108 12/03/17 1438 01/09/18 1205  BILITOT 1.3   < > 1.7* 1.2 1.4 1.4*  AST 56*   < > 64* 45* 68* 58*  ALT 49*   < > 48* 34* 49* 46*  ALKPHOS 82  --  76  --  88* 80  PROT 6.9   < > 7.0 6.2 6.5 6.2*  ALBUMIN 3.4*  --  3.6  --  3.1* 3.1*   < > = values in this interval not displayed.    Assessment and Plan: Pt with hx NASH cirrhosis, remote breast cancer, prior bland embolization as well as radiofrequency ablation of segment 8 [place] in 2018.  Now with imaging evidence of recurrence of [place] in the right lobe with 4 separate foci, largest measuring 1.5 cm inferior to the prior treatment site.  Following discussion with Dr. Earleen Newport on 12/10/2017 she presents today for repeat CT-guided microwave ablation of the largest 1.5 cm HCC recurrence.  Details/risks of procedure, including but not limited to, internal bleeding, infection, injury to adjacent structures, anesthesia related complications discussed with patient and husband with their understanding consent.  Post procedure she will be admitted to the hospital for overnight observation.   LABS PENDING   Electronically Signed: D. Rowe Robert, PA-C 01/21/2018, 11:22 AM   I spent a total of 30 minutes at the the patient's bedside AND on the patient's hospital floor or unit, greater than 50% of which was counseling/coordinating care for CT-guided microwave ablation of hepatocellular carcinoma recurrence

## 2018-01-21 NOTE — Procedures (Signed)
Interventional Radiology Procedure Note  Procedure: CT guided tissue ablation of right liver HCC.   3 separate treatment zones, for treatment of mx foci of suspected HCC.  1 = 2 probe, 65W, 37mn 2 = 2 probe, 65W, 626m 3 = 2 probe, 65W, 2 min .  Complications: None  Recommendations:  - To PACU - 23 hour observation - local wound care - analgesia as needed - VIR to follow - Anticipate DC in am - ~2-4 week follow up with Dr. WaEarleen Newportn clinic    Signed,  JaDulcy FannyWaEarleen NewportDO

## 2018-01-21 NOTE — Progress Notes (Signed)
Called by RN and notified of patient complaining of eye pain. Pt seen in room 1612. Pt with wet cloth covering bilateral eyes. Pt unable to remove covering or open eyes due to pain. Therefore I was not able to evaluate her eyes. Possible foreign body, corneal irritation, or corneal abrasion discussed. Treatment options explained and drops ordered.

## 2018-01-21 NOTE — Progress Notes (Signed)
Pt c/o severe eye pain. Per pt and family, pain same as previous ablation when tape was placed on eyes. Anesthesiologist made aware. Pt spouse and son also request to speak to physician. States no one spoke to them after procedure. IR Radiologist on call paged.  Will await call back.

## 2018-01-21 NOTE — Anesthesia Preprocedure Evaluation (Addendum)
Anesthesia Evaluation  Patient identified by MRN, date of birth, ID band Patient awake  General Assessment Comment:Patient endorses symptoms of corneal abrasion following last procedure.   Reviewed: Allergy & Precautions, NPO status , Patient's Chart, lab work & pertinent test results  History of Anesthesia Complications Negative for: history of anesthetic complications  Airway Mallampati: II  TM Distance: >3 FB Neck ROM: Full    Dental no notable dental hx. (+) Dental Advisory Given   Pulmonary neg pulmonary ROS,    breath sounds clear to auscultation       Cardiovascular hypertension, + Peripheral Vascular Disease   Rhythm:Regular + Systolic murmurs    Neuro/Psych negative neurological ROS  negative psych ROS   GI/Hepatic negative GI ROS, RECURRENT RIGHT HEPATOCELLULAR CARCINOMA   Endo/Other  diabetes, Type 2Hypothyroidism   Renal/GU Renal disease     Musculoskeletal negative musculoskeletal ROS (+)   Abdominal   Peds  Hematology negative hematology ROS (+)   Anesthesia Other Findings   Reproductive/Obstetrics                            Anesthesia Physical Anesthesia Plan  ASA: II  Anesthesia Plan: General   Post-op Pain Management:    Induction: Intravenous  PONV Risk Score and Plan: 3 and Ondansetron and Dexamethasone  Airway Management Planned: Oral ETT  Additional Equipment: None  Intra-op Plan:   Post-operative Plan: Extubation in OR  Informed Consent: I have reviewed the patients History and Physical, chart, labs and discussed the procedure including the risks, benefits and alternatives for the proposed anesthesia with the patient or authorized representative who has indicated his/her understanding and acceptance.   Dental advisory given  Plan Discussed with: CRNA and Surgeon  Anesthesia Plan Comments: (Patient with 3/6 systolic ejection murmur. Has been told in  past she has a murmur but never been evaluated. Endorses occasional pedal edema. Denies SOB or CP. Discussed with patient about getting this evaluated in house or as outpatient to further diagnose and manage as needed. )        Anesthesia Quick Evaluation

## 2018-01-21 NOTE — Transfer of Care (Signed)
Immediate Anesthesia Transfer of Care Note  Patient: Beverly Davis  Procedure(s) Performed: CT WITH ANESTHESIA/MICROWAVE THERMAL ABLATION OF LIVER (Right )  Patient Location: PACU  Anesthesia Type:General  Level of Consciousness: awake, alert , drowsy and patient cooperative  Airway & Oxygen Therapy: Patient Spontanous Breathing and Patient connected to face mask oxygen  Post-op Assessment: Report given to RN, Post -op Vital signs reviewed and stable and Patient moving all extremities  Post vital signs: Reviewed and stable  Last Vitals:  Vitals Value Taken Time  BP 147/81 01/21/2018  3:41 PM  Temp    Pulse 99 01/21/2018  3:42 PM  Resp 16 01/21/2018  3:42 PM  SpO2 99 % 01/21/2018  3:42 PM  Vitals shown include unvalidated device data.  Last Pain: There were no vitals filed for this visit.       Complications: No apparent anesthesia complications

## 2018-01-21 NOTE — Sedation Documentation (Signed)
Anesthesia in to sedate and monitor pt.

## 2018-01-22 ENCOUNTER — Other Ambulatory Visit (HOSPITAL_COMMUNITY): Payer: PPO

## 2018-01-22 ENCOUNTER — Encounter (HOSPITAL_COMMUNITY): Payer: Self-pay | Admitting: Interventional Radiology

## 2018-01-22 DIAGNOSIS — C220 Liver cell carcinoma: Secondary | ICD-10-CM | POA: Diagnosis not present

## 2018-01-22 LAB — GLUCOSE, CAPILLARY
Glucose-Capillary: 139 mg/dL — ABNORMAL HIGH (ref 70–99)
Glucose-Capillary: 178 mg/dL — ABNORMAL HIGH (ref 70–99)

## 2018-01-22 MED ORDER — MAGNESIUM OXIDE 400 (241.3 MG) MG PO TABS: 400.0000 mg | ORAL_TABLET | Freq: Three times a day (TID) | ORAL | Status: DC

## 2018-01-22 MED ORDER — LACTULOSE 10 GM/15ML PO SOLN: 20.0000 g | Freq: Three times a day (TID) | ORAL | Status: DC

## 2018-01-22 MED ORDER — LEVOTHYROXINE SODIUM 150 MCG PO TABS: 75.0000 ug | ORAL_TABLET | ORAL | Status: DC

## 2018-01-22 MED ORDER — LEVOTHYROXINE SODIUM 50 MCG PO TABS: 150.0000 ug | ORAL_TABLET | ORAL | Status: DC

## 2018-01-22 MED ORDER — KETOROLAC TROMETHAMINE 0.5 % OP SOLN: 1.0000 [drp] | Freq: Three times a day (TID) | OPHTHALMIC | 0 refills | Status: DC | PRN

## 2018-01-22 MED ORDER — GLIPIZIDE 5 MG PO TABS
5.0000 mg | ORAL_TABLET | Freq: Three times a day (TID) | ORAL | Status: DC
  Administered 2018-01-22: 5 mg via ORAL
  Filled 2018-01-22: qty 1

## 2018-01-22 MED ORDER — SODIUM CHLORIDE 0.9 % IV BOLUS
500.0000 mL | Freq: Once | INTRAVENOUS | Status: AC
  Administered 2018-01-22: 500 mL via INTRAVENOUS

## 2018-01-22 MED ORDER — VITAMIN D3 25 MCG (1000 UNIT) PO TABS
5000.0000 [IU] | ORAL_TABLET | Freq: Every day | ORAL | Status: DC
  Administered 2018-01-22: 5000 [IU] via ORAL
  Filled 2018-01-22: qty 5

## 2018-01-22 NOTE — Discharge Summary (Signed)
Patient ID: Beverly Davis MRN: [id number] DOB/AGE: 1940-10-01 77 y.o.  Admit date: 01/21/2018 Discharge date: 01/22/2018  Supervising Physician: Corrie Mckusick  Patient Status: [facility] - In-pt  Admission Diagnoses: Hepatocellular carcinoma  Discharge Diagnoses: Hepatocellular carcinoma, status post CT-guided tissue ablation of right liver HCC using microwave technology, performing 3 serial and contiguous treatment zones in the expected region of the multifocal residual tumor via general anesthesia 01/21/18 Active Problems:   Hepatoma [facility])  Past Medical History:  Diagnosis Date  . Breast cancer ([place]) 05/2006   Right breast cancer  . Cirrhosis ([place])   . Diabetes mellitus without complication ([place])    type 2  . Fibroids    PT IS UNAWARE   . Hepatocellular carcinoma ([place]) 05/08/2016  . Hypothyroidism   . Osteopenia   . Personal history of radiation therapy 2008  . Thrombocytopenia ([place])   . Thyroid disease    Past Surgical History:  Procedure Laterality Date  . ABDOMINAL HYSTERECTOMY     TAH BSO  . BREAST LUMPECTOMY Right 2008  . BREAST LUMPECTOMY WITH AXILLARY LYMPH NODE BIOPSY Right 06/25/2006  . CHOLECYSTECTOMY    . IR GENERIC HISTORICAL  03/28/2016   IR RADIOLOGIST EVAL & MGMT 03/28/2016 Corrie Mckusick, DO [facility] INTERV RAD  . IR GENERIC HISTORICAL  05/01/2016   IR ANGIOGRAM VISCERAL SELECTIVE 05/01/2016 Corrie Mckusick, DO [facility]  . IR GENERIC HISTORICAL  05/01/2016   IR ANGIOGRAM VISCERAL SELECTIVE 05/01/2016 Corrie Mckusick, DO [facility]  . IR GENERIC HISTORICAL  05/01/2016   IR ANGIOGRAM SELECTIVE EACH ADDITIONAL VESSEL 05/01/2016 Corrie Mckusick, DO [facility]  . IR GENERIC HISTORICAL  05/01/2016   IR ANGIOGRAM SELECTIVE EACH ADDITIONAL VESSEL 05/01/2016 Corrie Mckusick, DO [facility]  . IR GENERIC HISTORICAL  05/01/2016   IR ANGIOGRAM SELECTIVE EACH ADDITIONAL VESSEL 05/01/2016 Corrie Mckusick, DO [facility]  . IR GENERIC HISTORICAL  05/01/2016   IR EMBO TUMOR ORGAN ISCHEMIA INFARCT  INC GUIDE ROADMAPPING 05/01/2016 Corrie Mckusick, DO [facility]  . IR GENERIC HISTORICAL  05/01/2016   IR CT SPINE LTD 05/01/2016 Corrie Mckusick, DO [facility]  . IR GENERIC HISTORICAL  05/01/2016   IR US GUIDE VASC ACCESS RIGHT 05/01/2016 Corrie Mckusick, DO [facility]  . IR GENERIC HISTORICAL  05/01/2016   IR ANGIOGRAM SELECTIVE EACH ADDITIONAL VESSEL 05/01/2016 Corrie Mckusick, DO [facility]  . IR RADIOLOGIST EVAL & MGMT  07/31/2016  . IR RADIOLOGIST EVAL & MGMT  12/25/2016  . IR RADIOLOGIST EVAL & MGMT  12/10/2017  . RADIOFREQUENCY ABLATION N/A 06/14/2016   Procedure: LIVER MICROWAVE THERMAL  ABLATION;  Surgeon: Corrie Mckusick, DO;  Location: [facility];  Service: Anesthesiology;  Laterality: N/A;  . RADIOLOGY WITH ANESTHESIA Right 01/21/2018   Procedure: CT WITH ANESTHESIA/MICROWAVE THERMAL ABLATION OF LIVER;  Surgeon: Corrie Mckusick, DO;  Location: [facility];  Service: Radiology;  Laterality: Right;  . TUBAL LIGATION  1978     Discharged Condition: good  Hospital Course: Beverly Davis is a 77 yo female with hx NASH cirrhosis, remote breast cancer, prior bland embolization as well as radiofrequency ablation of segment 8 [place] in 2018.  She has imaging evidence of recurrence of [place] in the right lobe with 4 separate foci, largest measuring 1.5 cm inferior to the prior treatment site.  On 01/21/2018 she underwent CT-guided tissue ablation of right liver HCC using microwave technology, performing 3 serial and contiguous treatment sounds in the expected region of the multifocal residual tumor.  Procedure was performed via general  anesthesia without immediate complications.  Postprocedure she was admitted for overnight observation.  Overnight the patient did fairly well but continued to have some irritation of both eyes from ? either lubricant or gel used during anesthesia, similar to episode during prior ablation from March of this year.  She was prescribed Toradol eyedrops by anesthesia staff with some improvement but mild  visual blurriness still present today.   They recommend continuing  drops for additional 24 hours.  If symptoms do not improve the patient was told to see eye physician.  She also had some mild right upper quadrant/shoulder discomfort not unexpected based on proximity of treated lesion to the diaphragm.  She was  given a 500 cc bolus of normal saline on day of discharge secondary to some mild hypotension.  She was able to tolerate her diet, void and ambulate without significant difficulty.  She was evaluated by Dr. Earleen Newport and deemed stable for discharge at this time.  She will continue her current home medications.  She will be scheduled for follow-up with Dr. Earleen Newport in the [place] clinic in 2 to 4 weeks. She was told to call our service with any additional questions.   Consults: anesthesia  Significant Diagnostic Studies:  Results for orders placed or performed during the hospital encounter of 01/21/18  Glucose, capillary  Result Value Ref Range   Glucose-Capillary 147 (H) 70 - 99 mg/dL  Glucose, capillary  Result Value Ref Range   Glucose-Capillary 178 (H) 70 - 99 mg/dL  Glucose, capillary  Result Value Ref Range   Glucose-Capillary 139 (H) 70 - 99 mg/dL     Treatments: CT-guided microwave ablation of right hepatic lobe HCC via general anesthesia on 01/21/2018  Discharge Exam: Blood pressure (!) 94/54, pulse 87, temperature (!) 97.1 F (36.2 C), temperature source Rectal, resp. rate 17, SpO2 96 %. Awake, alert.  Chest clear to auscultation bilaterally.  Heart with regular rate and rhythm, positive murmur.  Abdomen soft, positive bowel sounds, puncture sites right upper quadrant clean, dry, mildly tender to palpation.  Trace pretibial edema bilaterally.  Disposition:     Follow-up Information    Corrie Mckusick, DO Follow up.   Specialties:  Interventional Radiology, Radiology Why:  Radiology service will call you with follow-up appointment with Dr. Earleen Newport in the next 2 to 4 weeks.  Call  [phone] or [phone] with any questions. Contact information: [address] [place] [address] [phone]        [name], MD Follow up.   Specialty:  Oncology Why:  Follow-up with Dr. Marin Olp as scheduled Contact information: [address] [place] [address] [phone]            Electronically Signed: D. Rowe Robert, PA-C 01/22/2018, 1:38 PM   I have spent Greater Than 30 Minutes discharging Beverly Davis.

## 2018-01-22 NOTE — Discharge Instructions (Signed)
Radiofrequency/Microwave Ablation of Liver Tumors, Care After Refer to this sheet in the next few weeks. These instructions provide you with information on caring for yourself after your procedure. Your health care provider may also give you more specific instructions. Your treatment has been planned according to current medical practices, but problems sometimes occur. Call your health care provider if you have any problems or questions after your procedure. What can I expect after the procedure? After your procedure, you may have pain and discomfort in the upper abdomen. You will be given pain medicines to control this. Follow these instructions at home:  Take all medicines as directed by your health care provider.  Follow diet instructions as directed by your health care provider.  Follow instructions regarding rest and physical activity. Contact a health care provider if:  You cannot pass gas.  You cannot have a bowel movement within 2 days.  You have a skin rash.  You have a fever. Get help right away if:  You have severe or lasting abdominal pain or pain in your shoulder or back.  You have trouble swallowing or breathing.  You have severe weakness or dizziness.  You have chest pain or shortness of breath. This information is not intended to replace advice given to you by your health care provider. Make sure you discuss any questions you have with your health care provider. Document Released: 12/30/2012 Document Revised: 08/17/2015 Document Reviewed: 11/16/2012 Elsevier Interactive Patient Education  [employer].

## 2018-01-22 NOTE — Anesthesia Postprocedure Evaluation (Signed)
Anesthesia Post Note  Patient: Beverly Davis  Procedure(s) Performed: CT WITH ANESTHESIA/MICROWAVE THERMAL ABLATION OF LIVER (Right )     Patient location during evaluation: Other Anesthesia Type: General Level of consciousness: awake and alert Pain management: pain level controlled Vital Signs Assessment: post-procedure vital signs reviewed and stable Respiratory status: spontaneous breathing, nonlabored ventilation and respiratory function stable Cardiovascular status: blood pressure returned to baseline and stable Postop Assessment: no apparent nausea or vomiting Anesthetic complications: yes Anesthetic complication details: injury of cornea   Last Vitals:  Vitals:   01/21/18 2331 01/22/18 0341  BP: 132/61 (!) 94/54  Pulse: 76 87  Resp: 18 17  Temp:  (!) 36.2 C  SpO2: 100% 96%    Last Pain:  Vitals:   01/22/18 0440  TempSrc:   PainSc: [place]

## 2018-01-22 NOTE — Progress Notes (Addendum)
Dr. Sabra Heck, anesthesiology, rounded on Pt ca. 1250. Rec to continue eye gtt as long as they bring comfort.

## 2018-01-28 DIAGNOSIS — C229 Malignant neoplasm of liver, not specified as primary or secondary: Secondary | ICD-10-CM | POA: Diagnosis not present

## 2018-01-28 DIAGNOSIS — K7290 Hepatic failure, unspecified without coma: Secondary | ICD-10-CM | POA: Diagnosis not present

## 2018-01-28 DIAGNOSIS — K7460 Unspecified cirrhosis of liver: Secondary | ICD-10-CM | POA: Diagnosis not present

## 2018-02-04 ENCOUNTER — Other Ambulatory Visit: Payer: Self-pay | Admitting: Pharmacist

## 2018-02-04 NOTE — Patient Outreach (Signed)
[place]) Care Management  02/04/2018  Beverly Davis 1940-07-01 [id number]  Call to follow up with Dr. Benson Norway regarding patient being unable to afford prescription for Xifaxan. Note that patient was prescribed Xifaxan 550 mg twice daily and the copayment was $763. Note that per the [employer] website, [name] is a tier 5 option for the patient. Leave a message on the voicemail of Varney Biles in Dr. Ulyses Amor office.  Harlow Asa, PharmD, [place] Management [phone]

## 2018-02-04 NOTE — Patient Outreach (Signed)
[facility]) Care Management  02/04/2018  Beverly Davis 06-Feb-1941 [id number]   Incoming call from Beverly Davis in response to the [facility] Medication Adherence Campaign. Speak with patient. HIPAA identifiers verified and verbal consent received.   Medication Adherence  Beverly Davis reports that she takes glipizide 5 mg three times daily as directed and Janumet 50-1000 mg twice daily as directed. Denies any missed doses or barriers to adherence to these medications. Reports that her fasting blood sugar has recently been higher than she would like, around 150 mg/dL and that she is going to follow up with her PCP about this at her upcoming appointment next month.  Coordination of Care  Explain [facility] Care Management services to the patient and she declines need for a health coach. Counsel patient about the importance of eating regular balanced meals. Patient reports interest in starting to exercise regularly. Patient reports that she is familiar with the [place] program and has the contact information. Encourage patient to call to get started with this program.  Medication Assistance  Patient reports that she had difficulty with affording a medication that her liver doctor, Dr. Benson Davis, prescribed for her last week "for sleep", but that she is not sure of the name. Call patient's [name]. Per [place] technician patient was prescribed Xifaxan 550 mg twice daily and the copayment was $763. Note that per the [employer] website, Beverly Davis is a tier 5 option for the patient. Patient reports that she will not be able to afford this medication. Has not yet notified her doctor.  PLAN  1) Will call to follow up with Dr. Benson Davis regarding patient being unable to afford prescription for Xifaxan.   Beverly Davis, PharmD, [place] Management [phone]

## 2018-02-04 NOTE — Patient Outreach (Signed)
[place] [facility]) Care Management  02/04/2018  Beverly Davis 1940/06/10 [id number]  Call again to follow up with Dr. Ulyses Amor office regarding patient being unable to afford prescription for Xifaxan. Note that patient was prescribed Xifaxan 550 mg twice daily. Speak with Butch Penny in Dr. Ulyses Amor office who reports that the office was able to give the patient samples today. Reports that the office will be able to give patient enough samples to complete the course needed, so there will not be a further medication cost concern.  Call to follow up with Ms. Secrest to see if she has any further medication questions/concerns. Leave a HIPAA compliant message on the patient's voicemail. If have not heard from patient by 02/06/18, will give her another call at that time.   Harlow Asa, PharmD, [place] Management [phone]

## 2018-02-06 ENCOUNTER — Other Ambulatory Visit: Payer: Self-pay | Admitting: Pharmacist

## 2018-02-06 NOTE — Patient Outreach (Signed)
[facility]) Care Management  02/04/2018  KIOSHA BUCHAN Nov 10, 1940 [id number]   Call again to follow up with Ms. Geurts to see if she has any further medication questions/concerns. Note that per Butch Penny in Dr. Ulyses Amor office on 02/04/18, patient was given samples of Xifaxan 550 mg to complete the course of the medication as directed by Dr. Benson Norway. Outreach attempt #2. Leave a HIPAA compliant message on the patient's voicemail. If have not heard from patient by next week, will give her another call at that time.   Harlow Asa, PharmD, [place] Management [phone]

## 2018-02-11 ENCOUNTER — Other Ambulatory Visit: Payer: Self-pay | Admitting: Pharmacist

## 2018-02-11 NOTE — Patient Outreach (Signed)
[facility]) Care Management  02/04/2018  Beverly Davis 02-May-1940 [id number]   Call again to follow up with Ms. Sarratt to see if she has any further medication questions/concerns. Note that per Butch Penny in Dr. Ulyses Amor office on 02/04/18, patient was given samples of Xifaxan 550 mg to complete the course of the medication as directed by Dr. Benson Norway. Outreach attempt #3. Unable to leave a message as no voicemail picks up.  Will close pharmacy episode.   Harlow Asa, PharmD, [place] Management [phone]

## 2018-02-17 ENCOUNTER — Other Ambulatory Visit: Payer: PPO

## 2018-02-25 DIAGNOSIS — C229 Malignant neoplasm of liver, not specified as primary or secondary: Secondary | ICD-10-CM | POA: Diagnosis not present

## 2018-02-25 DIAGNOSIS — K7290 Hepatic failure, unspecified without coma: Secondary | ICD-10-CM | POA: Diagnosis not present

## 2018-03-06 ENCOUNTER — Other Ambulatory Visit: Payer: Self-pay

## 2018-03-06 MED ORDER — SITAGLIP PHOS-METFORMIN HCL ER 100-1000 MG PO TB24: ORAL_TABLET | ORAL | Status: DC

## 2018-03-06 MED ORDER — METFORMIN HCL ER (MOD) 1000 MG PO TB24: 1000.0000 mg | ORAL_TABLET | Freq: Every evening | ORAL | 2 refills | Status: DC

## 2018-03-06 NOTE — Telephone Encounter (Signed)
Patient requesting Janumet 50/1000mg, taking 2 tablets daily. Patient was given 100/1000mg tablet instead based on availability from the office. Patient advised to take once in the morning and a new prescription written for Metformin 1028m, taking one tablet in the evening and sent to the pharmacy.

## 2018-03-11 ENCOUNTER — Other Ambulatory Visit: Payer: Self-pay | Admitting: *Deleted

## 2018-03-11 ENCOUNTER — Ambulatory Visit: Payer: Self-pay | Admitting: Physician Assistant

## 2018-03-11 ENCOUNTER — Telehealth: Payer: Self-pay | Admitting: *Deleted

## 2018-03-11 ENCOUNTER — Other Ambulatory Visit: Payer: PPO

## 2018-03-11 MED ORDER — SITAGLIP PHOS-METFORMIN HCL ER 100-1000 MG PO TB24: ORAL_TABLET | ORAL | Status: DC

## 2018-03-11 MED ORDER — METFORMIN HCL ER 500 MG PO TB24: ORAL_TABLET | ORAL | 0 refills | Status: DC

## 2018-03-11 NOTE — Telephone Encounter (Signed)
Patient called and asked about her diabetic medications.  She is to take Janumet [id number](sample) 1 tablet in the AM and Metformin XR 500 mg 2 tablets in the PM. The patient wrote down the directions.

## 2018-03-15 IMAGING — XA IR ANGIO/ADD [PERSON_NAME]
12 of 24 series · 12 of 24 positions shown · IV contrast (IODINE)
Comparison: none

INDICATION: 75-year-old female with 4 cm hepatocellular carcinoma of the right
liver lobe.

[Series 18: care body 4 · 1 of 26 frames shown (1 of 9)]
[frame 14/26]
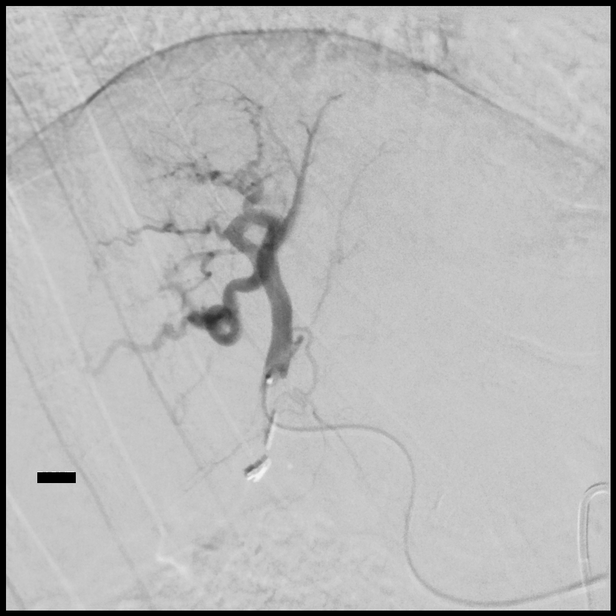

[Series 20: care body 4 · 1 of 32 frames shown (2 of 9)]
[frame 17/32]
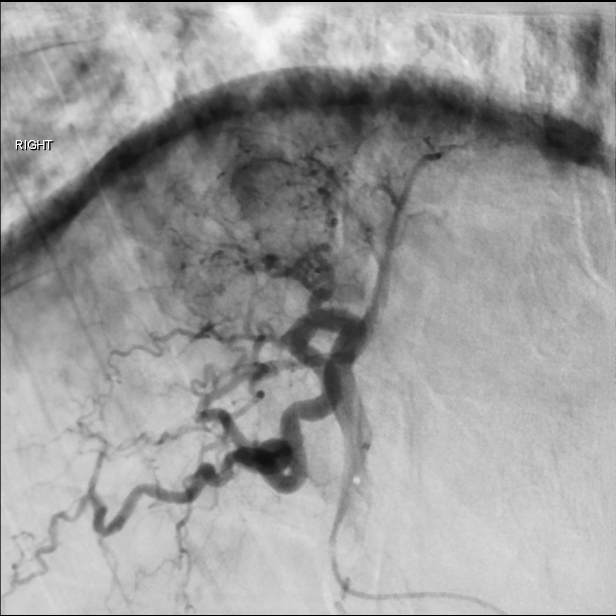

[Series 22: care body 4 · 1 of 36 frames shown (3 of 9)]
[frame 19/36]
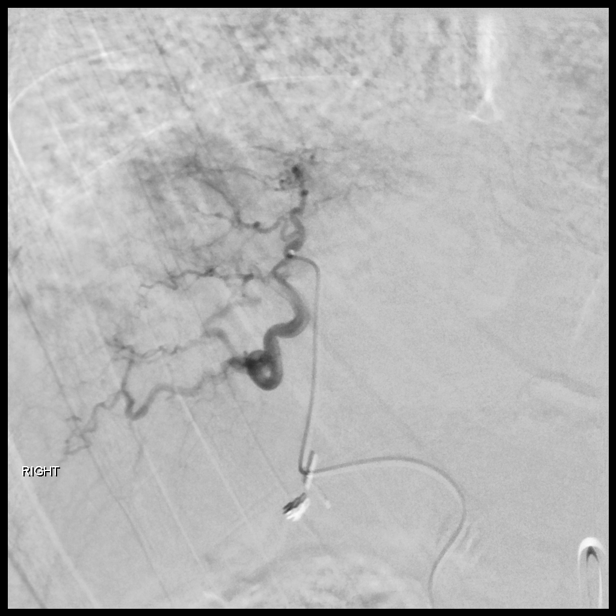

[Series 24: body 4 · 1 of 28 frames shown (1 of 2)]
[frame 15/28]
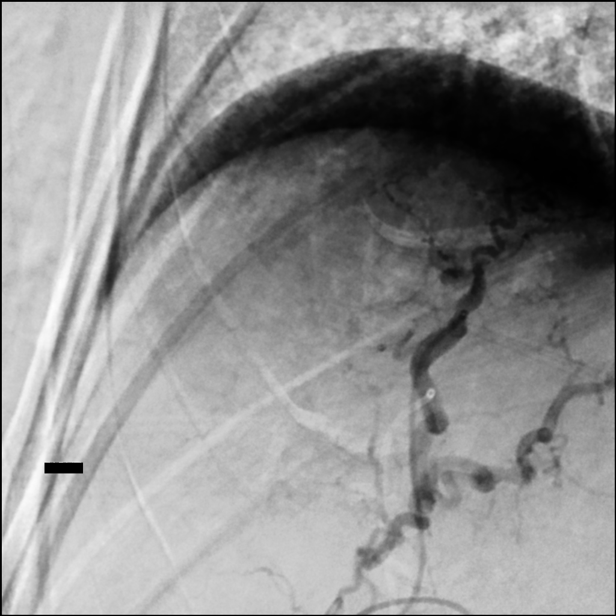

[Series 26: body 4 · 1 of 26 frames shown (2 of 2)]
[frame 18/26]
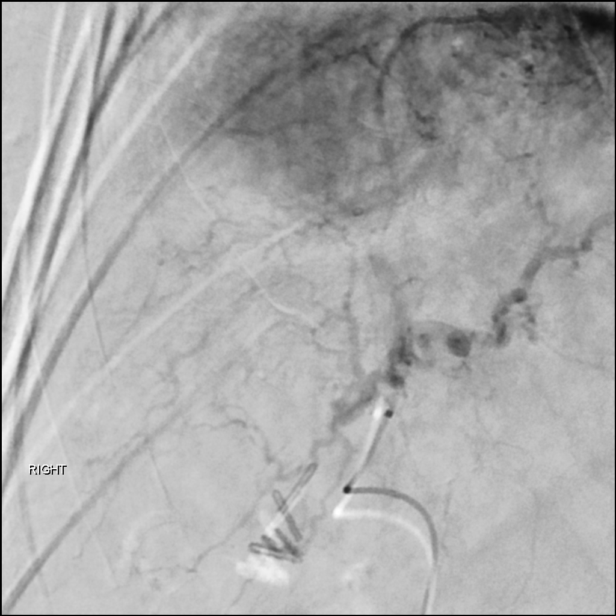

[Series 28: care body 4 · 1 of 48 frames shown (4 of 9)]
[frame 41/48]
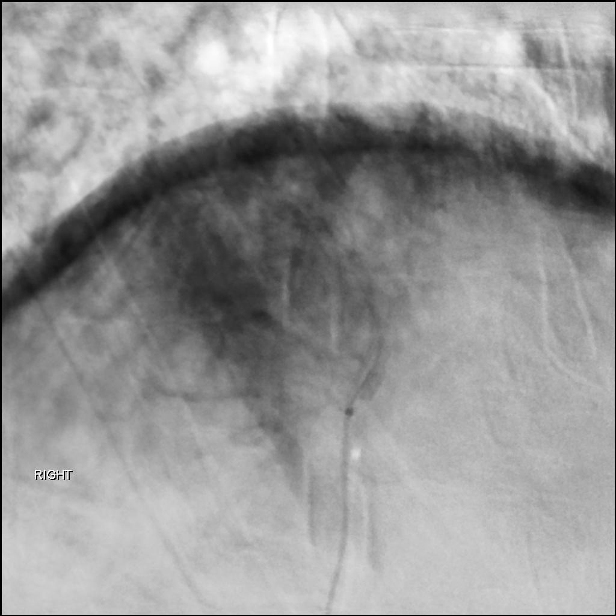

[Series 30: care body 4 · 1 of 27 frames shown (5 of 9)]
[frame 14/27]
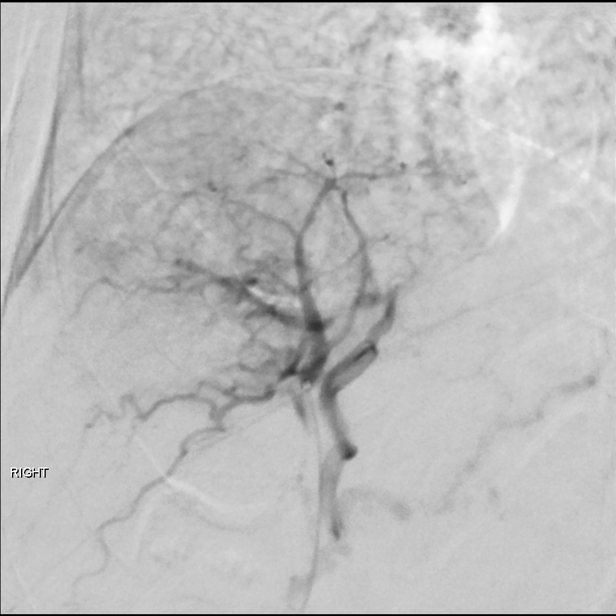

[Series 32: care body 4 · 1 of 37 frames shown (6 of 9)]
[frame 27/37]
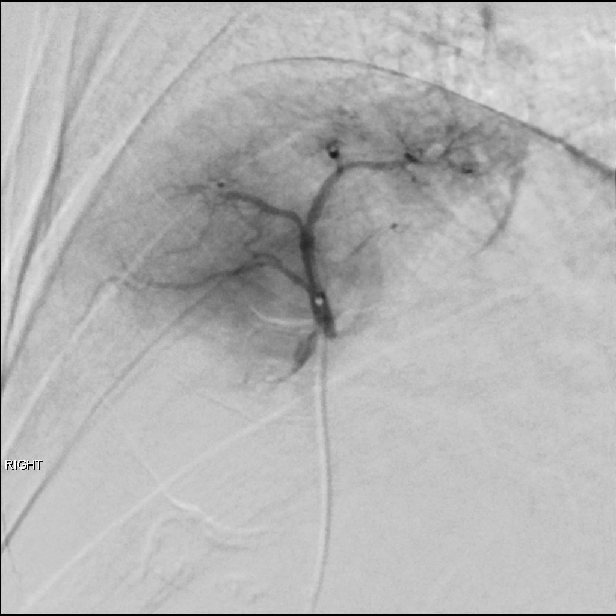

[Series 34: care body 4 · 1 of 22 frames shown (7 of 9)]
[frame 19/22]
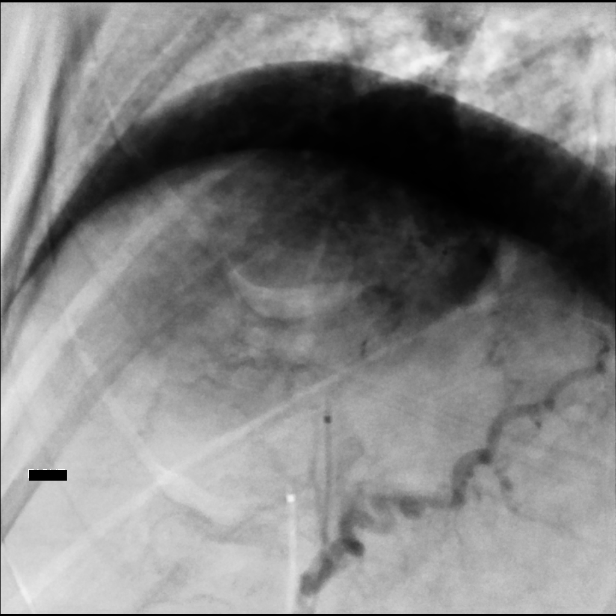

[Series 36: fl - angio · 1 of 22 frames shown]
[frame 22/22]
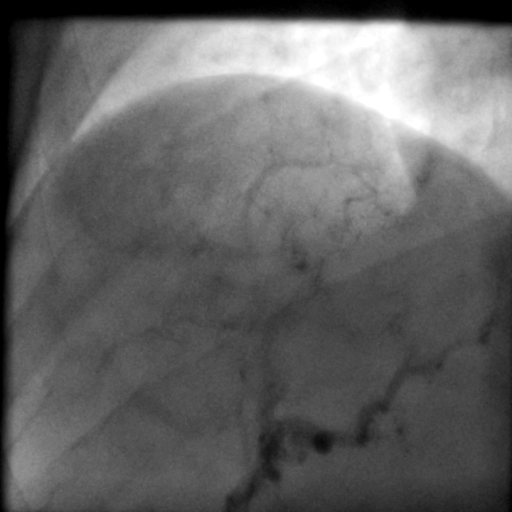

[Series 38: care body 4 · 1 of 27 frames shown (8 of 9)]
[frame 23/27]
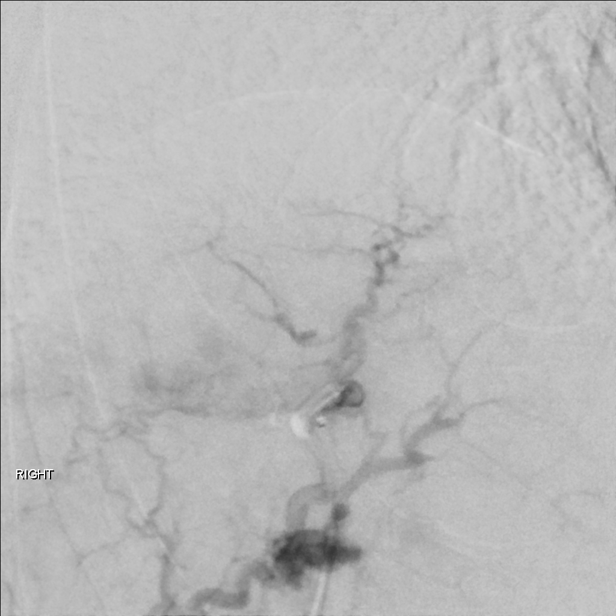

[Series 40: care body 4 · 1 of 19 frames shown (9 of 9)]
[frame 19/19]
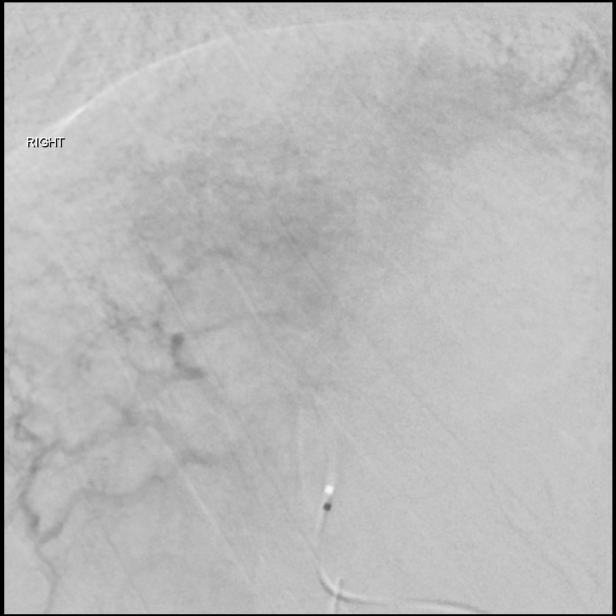

[12 of 24 positions shown; findings below may reference images not displayed]

She presents for mesenteric angiogram and embolization.

EXAM:
IR EMBO TUMOR ORGAN ISCHEMIA INFARCT INC GUIDE ROADMAPPING;
SELECTIVE VISCERAL ARTERIOGRAPHY; ADDITIONAL ARTERIOGRAPHY; IR
ULTRASOUND GUIDANCE VASC ACCESS RIGHT; IR CT SPINE LIMITED

MEDICATIONS:
3.75 g Zosyn. The antibiotic was administered within 1 hour of the
procedure

ANESTHESIA/SEDATION:
Moderate (conscious) sedation was employed during this procedure. A
total of Versed 3.0 mg and Fentanyl 100 mcg was administered
intravenously.

Moderate Sedation Time: 147 minutes. The patient's level of
consciousness and vital signs were monitored continuously by
radiology nursing throughout the procedure under my direct
supervision.

CONTRAST:  50mL 6MKIO8-U22 IOPAMIDOL (6MKIO8-U22) INJECTION 61%,
20mL 6MKIO8-U22 IOPAMIDOL (6MKIO8-U22) INJECTION 61%, 75mL
6MKIO8-U22 IOPAMIDOL (6MKIO8-U22) INJECTION 61%, 50mL 6MKIO8-U22
IOPAMIDOL (6MKIO8-U22) INJECTION 61%

FLUOROSCOPY TIME:  Fluoroscopy Time: 46 minutes 36 seconds (8575
mGy).

COMPLICATIONS:
None



Ultrasound survey of the right inguinal region was performed with
images stored and sent to PACs.

A micropuncture needle was used access the right common femoral
artery under ultrasound. With excellent arterial blood flow
returned, and an .018 micro wire was passed through the needle,
observed enter the abdominal aorta under fluoroscopy. The needle was
removed, and a micropuncture sheath was placed over the wire. The
inner dilator and wire were removed, and an 035 Bentson wire was
advanced under fluoroscopy into the abdominal aorta. The sheath was
removed and a standard 5 French vascular sheath was placed. The
dilator was removed and the sheath was flushed.

Standard 5 French C2 Cobra catheter was advanced over the wire, and
used to select the celiac artery. Formal angiogram performed.

Catheter was then used to select the superior mesenteric artery,
with mesenteric angiogram performed.

The catheter was then placed at the origin of the celiac artery.
Attempt was made to advance a Glidewire through the celiac artery
into hepatic artery, which failed secondary to a stenosis at the
celiac artery origin.

Micro wire and microcatheter combination were then advanced through
the base catheter, with the micro wire used to select the right
hepatic artery. Intermittent angiogram performed to target the
vascular supply to the hepatic tumor near the dome of the liver. As
we advance the microcatheter into the third order branches supplying
the tumor, base catheter was pushed from the origin of the celiac
artery. This was not salvaged, and the microcatheter was removed.

Both the Mickelson catheter and a Sos catheter were then advanced
over Benson wire in an attempt to find stability in the celiac
artery origin. The most stable position was achieved with the Sos
catheter.

The 135 cm length microcatheter was then advanced through the base
catheter with a micro wire, again with the microcatheter advanced
into the right hepatic artery branches to identified the branches
supplying tumor.

Using intermittent angiogram and probing of the third order
branches, the microcatheter tip was positioned within the arterial
supply to the tumor.

Cone beam CT was attempted using a 2 second a leg, 1 cc/second
injection with a 12 second acquisition. Iso center was not achieved
satisfactorily, with the image is off center towards the patient's
midline.

Further intermittent angiogram in repositioning of the catheter was
performed to position the microcatheter at the origin of the tumor
vasculature. There was a fairly normal vessel with no significant
tumor supply originating near the pathologic vasculature. This
vessel was then Gel-Foam embolized after sub selection 2 preserved
distal flow/patency.

Microcatheter was then withdrawn to the origin of the of vasculature
supplying the tumor. Once the microcatheter tip was confirmed in
location, embolization of the tumor vasculature was performed using
70-150 Jheico Paba beads.

After embolization was performed control angiogram performed.

Catheters and wires were removed.

Angiogram of the access site was performed.

Exoseal was deployed for hemostasis.

Patient tolerated the procedure well and remained hemodynamically
stable throughout.

No complications were encountered and no significant blood loss.
IMPRESSION: Status post mesenteric angiogram with embolization of right hepatic
lobe HCC with iodine impregnated Simonson beads.

Exoseal deployed for hemostasis.

## 2018-03-17 ENCOUNTER — Other Ambulatory Visit: Payer: Self-pay | Admitting: Internal Medicine

## 2018-03-31 NOTE — Progress Notes (Deleted)
MEDICARE ANNUAL WELLNESS VISIT AND FOLLOW UP  Assessment:    Encounter for Medicare annual wellness exam  Essential hypertension Continue medications Monitor blood pressure at home; call if consistently over 130/80 Continue DASH diet.   Reminder to go to the ER if any CP, SOB, nausea, dizziness, severe HA, changes vision/speech, left arm numbness and tingling and jaw pain.  Hepatocellular carcinoma ([place]) Followed by Dr. Marin Olp and Dr. Benson Norway  Type 2 diabetes mellitus with stage 2 chronic kidney disease, without long-term current use of insulin [place]) Education: Reviewed 'ABCs' of diabetes management (respective goals in parentheses):  A1C (<7), blood pressure (<130/80), and cholesterol (LDL <70) Eye Exam yearly and Dental Exam every 6 months. Dietary recommendations Physical Activity recommendations Just had foot exam   Hypothyroidism, unspecified type continue medications the same pending lab results reminded to take on an empty stomach 30-85mns before food.  check TSH level  Osteoporosis, unspecified osteoporosis type, unspecified pathological fracture presence Dexa due -ordered today Discussed due to stop foxamax; Davis believes this was started by oncologist and wants to check with him prior to stopping.   [place] Encephalopathy ([place] Cannot afford xifaxan prescribed by Dr. HBenson Norway per Dr. MMelford Aaserecommendation discussed alternately trying rifampin 300 mg BID, which should be much more affordable for her.  Continue with lactulose in the interim, but may be able to further reduce this as well to reduce SE/leakage.   Pancytopenia ([facility]) CBC, followed by Dr. EMarin Olp Vitamin D deficiency At goal at recent check; continue to recommend supplementation for goal of 70-100 Defer vitamin D level  Medication management CBC, CMP/GFR  Hyperlipidemia, unspecified hyperlipidemia type Not on meds secondary to liver disease Continue low cholesterol diet and exercise.  Check lipid  panel.   History of breast cancer S/p lumpectomy, continue with annual mammograms Follows with Dr. EMarin Olp Over 40 minutes of exam, counseling, chart review and critical decision making was performed Future Appointments  Date Time Provider [place] 04/01/2018  3:30 PM CVicie Mutters PA-C [facility] None  04/02/2018  1:00 PM [facility] IR [facility]  05/13/2018  2:00 PM [facility] LAB [facility] None  05/13/2018  2:30 PM [facility] 1 [facility]  05/13/2018  3:00 PM Ennever, PRudell Cobb MD [facility] None  06/10/2018  3:45 PM MUnk Pinto MD [facility] None     Plan:   During the course of the visit the patient was educated and counseled about appropriate screening and preventive services including:    Pneumococcal vaccine   Prevnar 13  Influenza vaccine  Td vaccine  Screening electrocardiogram  Bone densitometry screening  Colorectal cancer screening  Diabetes screening  Glaucoma screening  Nutrition counseling   Advanced directives: requested   Subjective:  Beverly HIGUCHIis a 78y.o. female who presents for Medicare Annual Wellness Visit and 3 month follow up.   Patient has remote hx/o Rt Breast cancer in 2008. 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 [facility] [place] [facility] Davis had Radio-embolization of a Rt Hepatic Lobe HCC, then followed in March 2018 with a thermal microwave ablation. 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 Most recent follow up CT in 05/2017 demonstrates stable from previous. Given Xifaxan by Dr. HBenson Norway   BMI is There is no height or weight on  file to calculate BMI., Davis has not been working on diet and exercise. Wt Readings from Last 3 Encounters:  01/21/18 147 lb 14.9 oz (67.1 kg)  12/10/17 148 lb (67.1 kg)   12/03/17 148 lb (67.1 kg)   Today their BP is   Davis does not workout. Davis denies chest pain, shortness of breath, dizziness.   Davis is not on cholesterol medication due to her liver disease. Her cholesterol is not at goal. The cholesterol last visit was:   Lab Results  Component Value Date   CHOL 189 11/28/2017   HDL 68 11/28/2017   LDLCALC 105 (H) 11/28/2017   TRIG 75 11/28/2017   CHOLHDL 2.8 11/28/2017   . Davis has been working on diet for T2 diabetes, and denies foot ulcerations, increased appetite, nausea, paresthesia of the feet, polydipsia, polyuria, visual disturbances, vomiting and weight loss. Davis does check fasting blood sugars and presents with log today; ranges 92-160, average is 110-120. Last A1C in the office was:  Lab Results  Component Value Date   HGBA1C 6.9 (H) 01/09/2018   Last GFR: Lab Results  Component Value Date   GFRNONAA >60 01/21/2018   Patient is on Vitamin D supplement.   Lab Results  Component Value Date   VD25OH 75 11/28/2017      Medication Review: Current Outpatient Medications on File Prior to Visit  Medication Sig Dispense Refill  . Cholecalciferol (VITAMIN D3) 5000 units CAPS Take 5,000 Units by mouth daily.    Marland Kitchen glipiZIDE (GLUCOTROL) 5 MG tablet Take 1 tablet 3 x /day with meals for Diabetes 270 tablet 3  . ketorolac (ACULAR) 0.5 % ophthalmic solution Place 1 drop into both eyes every 8 (eight) hours as needed (eye pain). Continue for additional 24 hours then stop 5 mL 0  . lactulose (CHRONULAC) 10 GM/15ML solution Take 30 mLs (20 g total) by mouth 3 (three) times daily. (Patient taking differently: Take 13.3 g by mouth daily. ) 240 mL 3  . levothyroxine (SYNTHROID, LEVOTHROID) 150 MCG tablet TAKE 1 TO 1 & 1/2 (ONE TO ONE & ONE-HALF) TABLETS BY MOUTH ONCE DAILY AS DIRECTED (Patient taking differently: Take 75-150 mcg by mouth See admin instructions. Take 150 mcg by mouth daily on Monday, Wednesday and Friday. Take 75 mcg by mouth daily on all  other days.) 135 tablet 0  . magnesium oxide (MAG-OX) 400 MG tablet Take 400 mg by mouth 3 (three) times daily.     . metFORMIN (GLUCOPHAGE-XR) 500 MG 24 hr tablet Take 2 tablets in the evening. 180 tablet 0  . ONE TOUCH ULTRA TEST test strip CHECK BLOOD SUGAR ONE TIME DAILY (Patient not taking: Reported on 01/05/2018) 100 each 9  . ONETOUCH DELICA LANCETS 32D MISC USE TO CHECK BLOOD SUGAR ONE TIME DAILY (Patient not taking: Reported on 01/05/2018) 100 each 6  . SitaGLIPtin-MetFORMIN HCl 480 137 3936 MG TB24 Take one tablet by mouth in the morning. 30 tablet   . triamcinolone cream (KENALOG) 0.5 % Apply 1 application topically 2 (two) times daily. (Patient not taking: Reported on 01/05/2018) 80 g 2   No current facility-administered medications on file prior to visit.     Allergies  Allergen Reactions  . Metformin And Related Other (See Comments)    Unknown  . Welchol [Colesevelam Hcl] Other (See Comments)    Unknown    Current Problems (verified) Patient Active Problem List   Diagnosis Date Noted  . Hepatoma ([place]) 01/21/2018  . No-show for appointment 11/26/2017  .  Poor compliance 11/26/2017  . Hepatocellular carcinoma ([place]) 05/08/2016  . Pancytopenia ([place])   . Hepatic encephalopathy ([place]) 03/18/2016  . Hyperlipemia 12/14/2014  . Hypothyroidism 05/18/2014  . Essential hypertension 05/18/2014  . Vitamin D deficiency 05/18/2014  . Medication management 05/18/2014  . 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 Osteoporosis   . History of breast cancer 09/30/2012    Screening Tests Immunization History  Administered Date(s) Administered  . Influenza Split 03/06/2011, 01/06/2013  . Influenza, High Dose Seasonal PF 01/12/2014, 12/14/2014, 12/27/2015, 01/22/2017, 12/17/2017  . Pneumococcal Conjugate-13 01/12/2014  . Pneumococcal Polysaccharide-23 08/30/2014  . Tdap 06/10/2012   Preventative care: Last colonoscopy: 2006, 2016 Benson Norway) Mammogram:  05/2017 [id number], OVERDUE, on fosamax  Prior  vaccinations: TD or Tdap: 2014  Influenza: 2018  Pneumococcal: 2015 Prevnar13: 2016 Shingles/Zostavax: Declines  Names of Other Physician/Practitioners you currently use: 1. [facility] here for primary care 2. Dr. Prudencio Burly, eye doctor, last visit - 2017 - has scheduled for 08/23/2017 3. Dr. Belva Agee, dentist, last visit 2018    Patient Care Team: Unk Pinto, MD as PCP - General (Internal Medicine) Carol Ada, MD as Consulting Physician (Gastroenterology) Inda Castle, MD (Inactive) as Consulting Physician (Gastroenterology) [name], MD as Consulting Physician (Oncology)  SURGICAL HISTORY Davis  has a past surgical history that includes Breast lumpectomy with axillary lymph node biopsy (Right, 06/25/2006); Cholecystectomy; Abdominal hysterectomy; Tubal ligation (1978); ir generic historical (03/28/2016); ir generic historical (05/01/2016); ir generic historical (05/01/2016); ir generic historical (05/01/2016); ir generic historical (05/01/2016); ir generic historical (05/01/2016); ir generic historical (05/01/2016); ir generic historical (05/01/2016); ir generic historical (05/01/2016); ir generic historical (05/01/2016); Radiofrequency ablation (N/A, 06/14/2016); IR Radiologist Eval & Mgmt (07/31/2016); IR Radiologist Eval & Mgmt (12/25/2016); Breast lumpectomy (Right, 2008); IR Radiologist Eval & Mgmt (12/10/2017); and Radiology with anesthesia (Right, 01/21/2018). FAMILY HISTORY Her family history includes Cancer in her father, mother, and paternal grandfather; Diabetes in her maternal aunt, sister, and sister; Heart Problems in her maternal grandfather; Hypertension in her sister; Liver disease in her brother. SOCIAL HISTORY Davis  reports that Davis has never smoked. Davis has never used smokeless tobacco. Davis reports that Davis does not drink alcohol or use drugs.   MEDICARE WELLNESS OBJECTIVES: Physical activity:   Cardiac risk factors:   Depression/mood screen:    Depression screen [facility] 2/9 11/28/2017  Decreased Interest 0  Down, Depressed, Hopeless 0  PHQ - 2 Score 0    ADLs:  In your present state of health, do you have any difficulty performing the following activities: 01/21/2018 01/21/2018  Hearing? - N  Vision? - N  Difficulty concentrating or making decisions? - N  Walking or climbing stairs? - N  Dressing or bathing? - N  Doing errands, shopping? N -  Preparing Food and eating ? - -  Using the Toilet? - -  In the past six months, have you accidently leaked urine? - -  Do you have problems with loss of bowel control? - -  Comment - -  Managing your Medications? - -  Managing your Finances? - -  Housekeeping or managing your Housekeeping? - -  Some recent data might be hidden     Cognitive Testing  Alert? Yes  Normal Appearance?Yes  Oriented to person? Yes  Place? Yes   Time? Yes  Recall of three objects?  Yes  Can perform simple calculations? Yes  Displays appropriate judgment?Yes  Can read the correct time from a watch face?Yes  EOL planning:  Review of Systems  Constitutional: Negative for malaise/fatigue and weight loss.  HENT: Negative for hearing loss and tinnitus.   Eyes: Negative for blurred vision and double vision.  Respiratory: Negative for cough, sputum production, shortness of breath and wheezing.   Cardiovascular: Negative for chest pain, palpitations, orthopnea, claudication, leg swelling and PND.  Gastrointestinal: Negative for abdominal pain, blood in stool, constipation, diarrhea, heartburn, melena, nausea and vomiting.  Genitourinary: Negative.   Musculoskeletal: Negative for falls, joint pain and myalgias.  Skin: Negative for rash.  Neurological: Negative for dizziness, tingling, sensory change, weakness and headaches.  Endo/Heme/Allergies: Negative for polydipsia.  Psychiatric/Behavioral: Negative.  Negative for depression, memory loss, substance abuse and suicidal ideas. The patient is not  nervous/anxious and does not have insomnia.   All other systems reviewed and are negative.    Objective:     There were no vitals filed for this visit. There is no height or weight on file to calculate BMI.  General appearance: alert, no distress, WD/WN, female HEENT: normocephalic, sclerae anicteric, TMs pearly, nares patent, no discharge or erythema, pharynx normal Oral cavity: MMM, no lesions Neck: supple, no lymphadenopathy, no thyromegaly, no masses Heart: RRR, normal S1, S2, no murmurs Lungs: CTA bilaterally, no wheezes, rhonchi, or rales Abdomen: +bs, soft, non tender, non distended, no masses, no hepatomegaly, no splenomegaly Musculoskeletal: nontender, no swelling, no obvious deformity Extremities: no edema, no cyanosis, no clubbing Pulses: 2+ symmetric, upper and lower extremities, normal cap refill Neurological: alert, oriented x 3, CN2-12 intact, strength normal upper extremities and lower extremities, sensation normal throughout, DTRs 2+ throughout, no cerebellar signs, gait normal Psychiatric: normal affect, behavior normal, pleasant   Medicare Attestation I have personally reviewed: The patient's medical and social history Their use of alcohol, tobacco or illicit drugs Their current medications and supplements The patient's functional ability including ADLs,fall risks, home safety risks, cognitive, and hearing and visual impairment Diet and physical activities Evidence for depression or mood disorders  The patient's weight, height, BMI, and visual acuity have been recorded in the chart.  I have made referrals, counseling, and provided education to the patient based on review of the above and I have provided the patient with a written personalized care plan for preventive services.     Vicie Mutters, PA-C   03/31/2018

## 2018-04-01 ENCOUNTER — Ambulatory Visit: Payer: Self-pay | Admitting: Physician Assistant

## 2018-04-02 ENCOUNTER — Other Ambulatory Visit: Payer: PPO

## 2018-04-06 NOTE — Progress Notes (Signed)
MEDICARE ANNUAL WELLNESS VISIT AND FOLLOW UP  Assessment:    Encounter for Medicare annual wellness exam 1 year  Essential hypertension Continue medications Monitor blood pressure at home; call if consistently over 130/80 Continue DASH diet.   Reminder to go to the ER if any CP, SOB, nausea, dizziness, severe HA, changes vision/speech, left arm numbness and tingling and jaw pain.  Hepatocellular carcinoma ([place]) Followed by Dr. Marin Olp and Dr. Benson Norway  Type 2 diabetes mellitus with stage 2 chronic kidney disease, without long-term current use of insulin [facility]) Education: Reviewed 'ABCs' of diabetes management (respective goals in parentheses):  A1C (<7), blood pressure (<130/80), and cholesterol (LDL <70) Eye Exam yearly and Dental Exam every 6 months. Dietary recommendations Physical Activity recommendations  Hypothyroidism, unspecified type continue medications the same pending lab results reminded to take on an empty stomach 30-61mns before food.  check TSH level  Osteoporosis, unspecified osteoporosis type, unspecified pathological fracture presence Off fosamax will repeat DEXA 2 years and may restart fosamax at that time  Hepatic Encephalopathy ([place] Continue with lactulose Has follow up Feb with labs to be drawn then  Pancytopenia ([place] CBC, followed by Dr. EMarin Olp Vitamin D deficiency At goal at recent check; continue to recommend supplementation for goal of 70-100 Defer vitamin D level  Medication management CBC, CMP/GFR  Hyperlipidemia, unspecified hyperlipidemia type Not on meds secondary to liver disease Continue low cholesterol diet and exercise.  Check lipid panel.   History of breast cancer S/p lumpectomy, continue with annual mammograms Follows with Dr. EMarin Olp Hepatoma ([facility] Continue follow up  [place]top[place] ([place]     CBC with Differential/Platelet  Pulmonary nodule Prefers to discuss with Dr. WRosalie Doctor Will send a note to  them Needs dedicated CT lung per MRI referral to evaluate nodule She has never been a smoker   Over 40 minutes of exam, counseling, chart review and critical decision making was performed Future Appointments  Date Time Provider DMcBaine 04/21/2018  1:00 PM [facility] IR [facility]  05/13/2018  2:00 PM [facility] LAB [facility] None  05/13/2018  2:30 PM [facility] 1 [facility]  05/13/2018  3:00 PM Ennever, PRudell Cobb MD [facility] None  06/10/2018  3:45 PM MUnk Pinto MD [facility] None     Plan:   During the course of the visit the patient was educated and counseled about appropriate screening and preventive services including:    Pneumococcal vaccine   Prevnar 13  Influenza vaccine  Td vaccine  Screening electrocardiogram  Bone densitometry screening  Colorectal cancer screening  Diabetes screening  Glaucoma screening  Nutrition counseling   Advanced directives: requested   Subjective:  Beverly CANNADAYis a 78y.o. female who presents for Medicare Annual Wellness Visit and 3 month follow up.   Patient has remote hx/o Rt Breast cancer in 2008. 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 [facility] she had Radio-embolization of a Rt Hepatic Lobe HCC, then followed in March 2018 with a thermal microwave ablation. She has history of asymptomatic portal vein hypertension.  Recent increase in AFP prompted an MRI liver that demonstrated reoccurence, she is s/p  CT-guided tissue ablation of right liver on 01/21/2018, HAS FOLLOW UP FEB. She follows with Dr. WEarleen Newportand EMarin Olp   On the MRI scan a Sub-cm right lower lobe pulmonary nodule was seen, CT  with contrast was suggested. She states she wants to talk with Dr. Earleen Newport before ordering the CT scan and wants it done at his office, will send a message.   She states she has a  big hospital bill and has to stretch her monthly check for food. She can cook food, but she states sometimes she has a hard time affording food. She is driving herself.   BMI is Body mass index is 25.72 kg/m., she has not been working on diet and exercise. Wt Readings from Last 3 Encounters:  04/08/18 145 lb 3.2 oz (65.9 kg)  01/21/18 147 lb 14.9 oz (67.1 kg)  12/10/17 148 lb (67.1 kg)   Today their BP is BP: 114/68 She does not workout. She denies chest pain, shortness of breath, dizziness.   She is not on cholesterol medication due to her liver disease. Her cholesterol is not at goal. The cholesterol last visit was:   Lab Results  Component Value Date   CHOL 189 11/28/2017   HDL 68 11/28/2017   LDLCALC 105 (H) 11/28/2017   TRIG 75 11/28/2017   CHOLHDL 2.8 11/28/2017   . She has been working on diet for T2 diabetes, and denies foot ulcerations, increased appetite, nausea, paresthesia of the feet, polydipsia, polyuria, visual disturbances, vomiting and weight loss. She does check fasting blood sugars and presents with log today; ranges 130-140's. Last A1C in the office was:  Lab Results  Component Value Date   HGBA1C 6.9 (H) 01/09/2018   Last GFR: Lab Results  Component Value Date   GFRNONAA >60 01/21/2018   Patient is on Vitamin D supplement.   Lab Results  Component Value Date   VD25OH 52 11/28/2017     She is on thyroid medication. Her medication was not changed last visit, M,W,F 1 pill and every other day 1/2 pill.  Lab Results  Component Value Date   TSH 0.65 12/17/2017  .   Medication Review: Current Outpatient Medications on File Prior to Visit  Medication Sig Dispense Refill  . Cholecalciferol (VITAMIN D3) 5000 units CAPS Take 5,000 Units by mouth daily.    Marland Kitchen glipiZIDE (GLUCOTROL) 5 MG tablet Take 1 tablet 3 x /day with meals for Diabetes 270 tablet 3  . lactulose (CHRONULAC) 10 GM/15ML solution Take 30 mLs (20 g total) by mouth 3 (three) times daily. (Patient  taking differently: Take 13.3 g by mouth daily. ) 240 mL 3  . levothyroxine (SYNTHROID, LEVOTHROID) 150 MCG tablet TAKE 1 TO 1 & 1/2 (ONE TO ONE & ONE-HALF) TABLETS BY MOUTH ONCE DAILY AS DIRECTED (Patient taking differently: Take 75-150 mcg by mouth See admin instructions. Take 150 mcg by mouth daily on Monday, Wednesday and Friday. Take 75 mcg by mouth daily on all other days.) 135 tablet 0  . magnesium oxide (MAG-OX) 400 MG tablet Take 400 mg by mouth 3 (three) times daily.     . metFORMIN (GLUCOPHAGE-XR) 500 MG 24 hr tablet Take 2 tablets in the evening. 180 tablet 0  . ONE TOUCH ULTRA TEST test strip CHECK BLOOD SUGAR ONE TIME DAILY 100 each 9  . ONETOUCH DELICA LANCETS 63W MISC USE TO CHECK BLOOD SUGAR ONE TIME DAILY 100 each 6  . SitaGLIPtin-MetFORMIN HCl [phone] MG TB24 Take one tablet by mouth in the morning. 30 tablet    No current facility-administered medications on file prior to visit.     Allergies  Allergen Reactions  . Metformin And Related Other (See Comments)    Unknown  .  Welchol [Colesevelam Hcl] Other (See Comments)    Unknown    Current Problems (verified) Patient Active Problem List   Diagnosis Date Noted  . Hepatoma ([place]) 01/21/2018  . Poor compliance 11/26/2017  . Hepatocellular carcinoma ([place]) 05/08/2016  . Pancytopenia ([place])   . Hepatic encephalopathy ([place]) 03/18/2016  . Hyperlipemia 12/14/2014  . Hypothyroidism 05/18/2014  . Essential hypertension 05/18/2014  . Vitamin D deficiency 05/18/2014  . Medication management 05/18/2014  . 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 Osteoporosis   . History of breast cancer 09/30/2012    Screening Tests Immunization History  Administered Date(s) Administered  . Influenza Split 03/06/2011, 01/06/2013  . Influenza, High Dose Seasonal PF 01/12/2014, 12/14/2014, 12/27/2015, 01/22/2017, 12/17/2017  . Pneumococcal Conjugate-13 01/12/2014  . Pneumococcal Polysaccharide-23 08/30/2014  . Tdap 06/10/2012   Preventative  care: Last colonoscopy: 2016 Benson Norway) Mammogram:  05/2017 Dexa:09/2017 osteopenia -2.4, on fosamax  Prior vaccinations: TD or Tdap: 2014  Influenza: 2019  Pneumococcal: 2015 Prevnar13: 2016 Shingles/Zostavax: Declines  Names of Other Physician/Practitioners you currently use: 1. [facility] here for primary care 2. Dr. Prudencio Burly, eye doctor, last visit -  08/23/2017 3. Dr. Belva Agee, dentist, last visit 2018    Patient Care Team: Unk Pinto, MD as PCP - General (Internal Medicine) Carol Ada, MD as Consulting Physician (Gastroenterology) Inda Castle, MD (Inactive) as Consulting Physician (Gastroenterology) [name], MD as Consulting Physician (Oncology)  SURGICAL HISTORY She  has a past surgical history that includes Breast lumpectomy with axillary lymph node biopsy (Right, 06/25/2006); Cholecystectomy; Abdominal hysterectomy; Tubal ligation (1978); ir generic historical (03/28/2016); ir generic historical (05/01/2016); ir generic historical (05/01/2016); ir generic historical (05/01/2016); ir generic historical (05/01/2016); ir generic historical (05/01/2016); ir generic historical (05/01/2016); ir generic historical (05/01/2016); ir generic historical (05/01/2016); ir generic historical (05/01/2016); Radiofrequency ablation (N/A, 06/14/2016); IR Radiologist Eval & Mgmt (07/31/2016); IR Radiologist Eval & Mgmt (12/25/2016); Breast lumpectomy (Right, 2008); IR Radiologist Eval & Mgmt (12/10/2017); and Radiology with anesthesia (Right, 01/21/2018).   FAMILY HISTORY Her family history includes Cancer in her father, mother, and paternal grandfather; Diabetes in her maternal aunt, sister, and sister; Heart Problems in her maternal grandfather; Hypertension in her sister; Liver disease in her brother.   SOCIAL HISTORY She  reports that she has never smoked. She has never used smokeless tobacco. She reports that she does not drink alcohol or use drugs.   MEDICARE  WELLNESS OBJECTIVES: Physical activity:   Cardiac risk factors:   Depression/mood screen:   Depression screen [facility] 2/9 11/28/2017  Decreased Interest 0  Down, Depressed, Hopeless 0  PHQ - 2 Score 0    ADLs:  In your present state of health, do you have any difficulty performing the following activities: 01/21/2018 01/21/2018  Hearing? - N  Vision? - N  Difficulty concentrating or making decisions? - N  Walking or climbing stairs? - N  Dressing or bathing? - N  Doing errands, shopping? N -  Preparing Food and eating ? - -  Using the Toilet? - -  In the past six months, have you accidently leaked urine? - -  Do you have problems with loss of bowel control? - -  Comment - -  Managing your Medications? - -  Managing your Finances? - -  Housekeeping or managing your Housekeeping? - -  Some recent data might be hidden     Cognitive Testing  Alert? Yes  Normal Appearance?Yes  Oriented to person? Yes  Place? Yes   Time?  Yes  Recall of three objects?  Yes  Can perform simple calculations? Yes  Displays appropriate judgment?Yes  Can read the correct time from a watch face?Yes  EOL planning: Does Patient Have a Medical Advance Directive?: No Would patient like information on creating a medical advance directive?: No - Patient declined  Review of Systems  Constitutional: Negative for malaise/fatigue and weight loss.  HENT: Negative for hearing loss and tinnitus.   Eyes: Negative for blurred vision and double vision.  Respiratory: Negative for cough, sputum production, shortness of breath and wheezing.   Cardiovascular: Negative for chest pain, palpitations, orthopnea, claudication, leg swelling and PND.  Gastrointestinal: Positive for diarrhea (occ with medication). Negative for abdominal pain, blood in stool, constipation, heartburn, melena, nausea and vomiting.  Genitourinary: Negative.   Musculoskeletal: Negative for falls, joint pain and myalgias.  Skin: Negative for rash.   Neurological: Negative for dizziness, tingling, sensory change, weakness and headaches.  Endo/Heme/Allergies: Negative for polydipsia.  Psychiatric/Behavioral: Negative.  Negative for depression, memory loss, substance abuse and suicidal ideas. The patient is not nervous/anxious and does not have insomnia.   All other systems reviewed and are negative.    Objective:     Today's Vitals   04/08/18 0935  BP: 114/68  Pulse: 71  Temp: 98.3 F (36.8 C)  SpO2: 98%  Weight: 145 lb 3.2 oz (65.9 kg)  Height: 5' 3"  (1.6 m)   Body mass index is 25.72 kg/m.  General appearance: alert, no distress, WD/WN, female HEENT: normocephalic, sclerae anicteric, TMs pearly, nares patent, no discharge or erythema, pharynx normal Oral cavity: MMM, no lesions Neck: supple, no lymphadenopathy, no thyromegaly, no masses Heart: RRR, normal S1, S2, 0-0/7 holosystolic murmur Lungs: CTA bilaterally, no wheezes, rhonchi, or rales Abdomen: +bs, soft, non tender, non distended, no masses, no hepatomegaly, no splenomegaly Musculoskeletal: nontender, no swelling, no obvious deformity Extremities: no edema, no cyanosis, no clubbing Pulses: 2+ symmetric, upper and lower extremities, normal cap refill Neurological: alert, oriented x 3, CN2-12 intact, strength normal upper extremities and lower extremities, sensation normal throughout, DTRs 2+ throughout, no cerebellar signs, gait normal Psychiatric: normal affect, behavior normal, pleasant   Medicare Attestation I have personally reviewed: The patient's medical and social history Their use of alcohol, tobacco or illicit drugs Their current medications and supplements The patient's functional ability including ADLs,fall risks, home safety risks, cognitive, and hearing and visual impairment Diet and physical activities Evidence for depression or mood disorders  The patient's weight, height, BMI, and visual acuity have been recorded in the chart.  I have made  referrals, counseling, and provided education to the patient based on review of the above and I have provided the patient with a written personalized care plan for preventive services.     Vicie Mutters, PA-C   04/08/2018

## 2018-04-08 ENCOUNTER — Encounter: Payer: Self-pay | Admitting: Physician Assistant

## 2018-04-08 ENCOUNTER — Ambulatory Visit (INDEPENDENT_AMBULATORY_CARE_PROVIDER_SITE_OTHER): Payer: PPO | Admitting: Physician Assistant

## 2018-04-08 VITALS — BP 114/68 | HR 71 | Temp 98.3°F | Ht 63.0 in | Wt 145.2 lb

## 2018-04-08 DIAGNOSIS — I10 Essential (primary) hypertension: Secondary | ICD-10-CM

## 2018-04-08 DIAGNOSIS — R6889 Other general symptoms and signs: Secondary | ICD-10-CM

## 2018-04-08 DIAGNOSIS — Z79899 Other long term (current) drug therapy: Secondary | ICD-10-CM

## 2018-04-08 DIAGNOSIS — Z853 Personal history of malignant neoplasm of breast: Secondary | ICD-10-CM | POA: Diagnosis not present

## 2018-04-08 DIAGNOSIS — E1122 Type 2 diabetes mellitus with diabetic chronic kidney disease: Secondary | ICD-10-CM | POA: Diagnosis not present

## 2018-04-08 DIAGNOSIS — E785 Hyperlipidemia, unspecified: Secondary | ICD-10-CM

## 2018-04-08 DIAGNOSIS — E559 Vitamin D deficiency, unspecified: Secondary | ICD-10-CM | POA: Diagnosis not present

## 2018-04-08 DIAGNOSIS — K7682 Hepatic encephalopathy: Secondary | ICD-10-CM

## 2018-04-08 DIAGNOSIS — Z0000 Encounter for general adult medical examination without abnormal findings: Secondary | ICD-10-CM

## 2018-04-08 DIAGNOSIS — R911 Solitary pulmonary nodule: Secondary | ICD-10-CM | POA: Insufficient documentation

## 2018-04-08 DIAGNOSIS — E039 Hypothyroidism, unspecified: Secondary | ICD-10-CM | POA: Diagnosis not present

## 2018-04-08 DIAGNOSIS — M810 Age-related osteoporosis without current pathological fracture: Secondary | ICD-10-CM

## 2018-04-08 DIAGNOSIS — K7290 Hepatic failure, unspecified without coma: Secondary | ICD-10-CM

## 2018-04-08 DIAGNOSIS — N182 Chronic kidney disease, stage 2 (mild): Secondary | ICD-10-CM

## 2018-04-08 DIAGNOSIS — D61818 Other pancytopenia: Secondary | ICD-10-CM

## 2018-04-08 DIAGNOSIS — Z0001 Encounter for general adult medical examination with abnormal findings: Secondary | ICD-10-CM | POA: Diagnosis not present

## 2018-04-08 DIAGNOSIS — C220 Liver cell carcinoma: Secondary | ICD-10-CM

## 2018-04-08 NOTE — Patient Instructions (Addendum)
I will contact your doctors about the CT of the chest to get that set up   For additional health and human services resources for senior adults, please contact [facility] at [phone] in [place] and [place] at [phone] in all other areas.     When it comes to diets, agreement about the perfect plan isn't easy to find, even among the experts. Experts at the [employer] developed an idea known as the Healthy Eating Plate. Just imagine a plate divided into logical, healthy portions.  The emphasis is on diet quality:  Load up on vegetables and fruits - one-half of your plate: Aim for color and variety, and remember that potatoes don't count.  Go for whole grains - one-quarter of your plate: Whole wheat, barley, wheat berries, quinoa, oats, brown rice, and foods made with them. If you want pasta, go with whole wheat pasta.  Protein power - one-quarter of your plate: Fish, chicken, beans, and nuts are all healthy, versatile protein sources. Limit red meat.  The diet, however, does go beyond the plate, offering a few other suggestions.  Use healthy plant oils, such as olive, canola, soy, corn, sunflower and peanut. Check the labels, and avoid partially hydrogenated oil, which have unhealthy trans fats.  If you're thirsty, drink water. Coffee and tea are good in moderation, but skip sugary drinks and limit milk and dairy products to one or two daily servings.  The type of carbohydrate in the diet is more important than the amount. Some sources of carbohydrates, such as vegetables, fruits, whole grains, and beans-are healthier than others.  Finally, stay active.

## 2018-04-09 LAB — HEMOGLOBIN A1C
Hgb A1c MFr Bld: 7.1 % of total Hgb — ABNORMAL HIGH (ref <5.7)
MEAN PLASMA GLUCOSE: 157 (calc)
eAG (mmol/L): 8.7 (calc)

## 2018-04-09 LAB — LIPID PANEL
CHOLESTEROL: 184 mg/dL (ref <200)
HDL: 59 mg/dL (ref >50)
LDL Cholesterol (Calc): 105 mg/dL (calc) — ABNORMAL HIGH
Non-HDL Cholesterol (Calc): 125 mg/dL (calc) (ref <130)
Total CHOL/HDL Ratio: 3.1 (calc) (ref <5.0)
Triglycerides: 102 mg/dL (ref <150)

## 2018-04-09 LAB — COMPLETE METABOLIC PANEL WITH GFR
AG Ratio: 1.3 (calc) (ref 1.0–2.5)
ALKALINE PHOSPHATASE (APISO): 90 U/L (ref 33–130)
ALT: 33 U/L — ABNORMAL HIGH (ref 6–29)
AST: 46 U/L — AB (ref 10–35)
Albumin: 3.4 g/dL — ABNORMAL LOW (ref 3.6–5.1)
BUN: 14 mg/dL (ref 7–25)
CHLORIDE: 108 mmol/L (ref 98–110)
CO2: 20 mmol/L (ref 20–32)
Calcium: 8.9 mg/dL (ref 8.6–10.4)
Creat: 0.77 mg/dL (ref 0.60–0.93)
GFR, Est African American: 87 mL/min/{1.73_m2} (ref >=60)
GFR, Est Non African American: 75 mL/min/{1.73_m2} (ref >=60)
Globulin: 2.7 g/dL (calc) (ref 1.9–3.7)
Glucose, Bld: 146 mg/dL — ABNORMAL HIGH (ref 65–99)
Potassium: 4.2 mmol/L (ref 3.5–5.3)
Sodium: 136 mmol/L (ref 135–146)
Total Bilirubin: 1 mg/dL (ref 0.2–1.2)
Total Protein: 6.1 g/dL (ref 6.1–8.1)

## 2018-04-09 LAB — TSH: TSH: 0.27 mIU/L — ABNORMAL LOW (ref 0.40–4.50)

## 2018-04-09 LAB — CBC WITH DIFFERENTIAL/PLATELET
Absolute Monocytes: 288 cells/uL (ref 200–950)
Basophils Absolute: 29 cells/uL (ref 0–200)
Basophils Relative: 1.2 %
Eosinophils Absolute: 50 cells/uL (ref 15–500)
Eosinophils Relative: 2.1 %
HCT: 31.4 % — ABNORMAL LOW (ref 35.0–45.0)
Hemoglobin: 10.7 g/dL — ABNORMAL LOW (ref 11.7–15.5)
Lymphs Abs: 578 cells/uL — ABNORMAL LOW (ref 850–3900)
MCH: 35.8 pg — ABNORMAL HIGH (ref 27.0–33.0)
MCHC: 34.1 g/dL (ref 32.0–36.0)
MCV: 105 fL — ABNORMAL HIGH (ref 80.0–100.0)
MPV: 11 fL (ref 7.5–12.5)
Monocytes Relative: 12 %
Neutro Abs: 1454 cells/uL — ABNORMAL LOW (ref 1500–7800)
Neutrophils Relative %: 60.6 %
Platelets: 95 10*3/uL — ABNORMAL LOW (ref 140–400)
RBC: 2.99 10*6/uL — AB (ref 3.80–5.10)
RDW: 13.5 % (ref 11.0–15.0)
TOTAL LYMPHOCYTE: 24.1 %
WBC: 2.4 10*3/uL — AB (ref 3.8–10.8)

## 2018-04-09 LAB — MAGNESIUM: Magnesium: 1.7 mg/dL (ref 1.5–2.5)

## 2018-04-15 ENCOUNTER — Encounter: Payer: Self-pay | Admitting: Internal Medicine

## 2018-04-21 ENCOUNTER — Ambulatory Visit
Admission: RE | Admit: 2018-04-21 | Discharge: 2018-04-21 | Disposition: A | Discharge status: Home / Self Care | Payer: PPO | Source: Ambulatory Visit | Attending: Radiology | Admitting: Radiology

## 2018-04-21 DIAGNOSIS — Z9889 Other specified postprocedural states: Secondary | ICD-10-CM | POA: Diagnosis not present

## 2018-04-21 DIAGNOSIS — C220 Liver cell carcinoma: Secondary | ICD-10-CM | POA: Diagnosis not present

## 2018-04-21 HISTORY — PX: IR RADIOLOGIST EVAL & MGMT: IMG5224

## 2018-04-21 NOTE — Progress Notes (Signed)
Chief Complaint: [facility]  Referring Physician(s): Dr. Lattie Haw  History of Present Illness: Beverly Davis is a 78 y.o. female presenting as a scheduled follow up appointment to [facility] today.    She is here by herself for the interview.   She is SP treatment of segment 8 [place] as well as progression of the right liver.  Her first treatment was a planned dual-therapy, with trans-arterial embolization performed 05/01/2016, then ablation 06/14/2016.    Surveillance imaging revealed progression of the right liver, and she was treated most recently 01/21/2018 with CT guided ablation with microwave technology.  She was treated and observed overnight.  She went home the following morning.  Interestingly, she has a second episode of eye irritation following the second treatment in October.  This occurred on her first GETA treatment in 2018, and we had lengthy discussion with anesthesia team to assure that this could be avoided.  All precautions were taken.  She may have sensitivity to the protective ointment that was used, or some other reaction.  She has had no long-term problem.   At any rate, she returns today as her first follow up appointment after the ablation.  She has no complaints.  She has not had any hospitalizations.  She denies any flu.  She has received the flu shot this season.    Last AFP was 12/03/2017:  19.6  She tells me she does not know of upcoming appointment with Dr. Marin Olp.   Past Medical History:  Diagnosis Date  . Breast cancer ([place]) 05/2006   Right breast cancer  . Cirrhosis ([place])   . Diabetes mellitus without complication ([place])    type 2  . Fibroids    PT IS UNAWARE   . Hepatocellular carcinoma ([place]) 05/08/2016  . Hypothyroidism   . Osteopenia   . Personal history of radiation therapy 2008  . Thrombocytopenia ([place])   . Thyroid disease     Past Surgical History:  Procedure Laterality Date  . ABDOMINAL HYSTERECTOMY     TAH BSO  . BREAST LUMPECTOMY  Right 2008  . BREAST LUMPECTOMY WITH AXILLARY LYMPH NODE BIOPSY Right 06/25/2006  . CHOLECYSTECTOMY    . IR GENERIC HISTORICAL  03/28/2016   IR RADIOLOGIST EVAL & MGMT 03/28/2016 Corrie Mckusick, DO [facility] INTER[facility] RAD  . IR GENERIC HISTORICAL  05/01/2016   IR ANGIOGRAM VISCERAL SELECTIVE 05/01/2016 Corrie Mckusick, DO [facility]  . IR GENERIC HISTORICAL  05/01/2016   IR ANGIOGRAM VISCERAL SELECTIVE 05/01/2016 Corrie Mckusick, DO [facility]  . IR GENERIC HISTORICAL  05/01/2016   IR ANGIOGRAM SELECTIVE EACH ADDITIONAL VESSEL 05/01/2016 Corrie Mckusick, DO [facility]  . IR GENERIC HISTORICAL  05/01/2016   IR ANGIOGRAM SELECTIVE EACH ADDITIONAL VESSEL 05/01/2016 Corrie Mckusick, DO [facility]  . IR GENERIC HISTORICAL  05/01/2016   IR ANGIOGRAM SELECTIVE EACH ADDITIONAL VESSEL 05/01/2016 Corrie Mckusick, DO [facility]  . IR GENERIC HISTORICAL  05/01/2016   IR EMBO TUMOR ORGAN ISCHEMIA INFARCT INC GUIDE ROADMAPPING 05/01/2016 Corrie Mckusick, DO [facility]  . IR GENERIC HISTORICAL  05/01/2016   IR CT SPINE LTD 05/01/2016 Corrie Mckusick, DO [facility]  . IR GENERIC HISTORICAL  05/01/2016   IR US GUIDE VASC ACCESS RIGHT 05/01/2016 Corrie Mckusick, DO [facility]  . IR GENERIC HISTORICAL  05/01/2016   IR ANGIOGRAM SELECTIVE EACH ADDITIONAL VESSEL 05/01/2016 Corrie Mckusick, DO [facility]  . IR RADIOLOGIST EVAL & MGMT  07/31/2016  . IR RADIOLOGIST EVAL & MGMT  12/25/2016  .  IR RADIOLOGIST EVAL & MGMT  12/10/2017  . IR RADIOLOGIST EVAL & MGMT  04/21/2018  . RADIOFREQUENCY ABLATION N/A 06/14/2016   Procedure: LIVER MICROWAVE THERMAL  ABLATION;  Surgeon: Corrie Mckusick, DO;  Location: [facility];  Service: Anesthesiology;  Laterality: N/A;  . RADIOLOGY WITH ANESTHESIA Right 01/21/2018   Procedure: CT WITH ANESTHESIA/MICROWAVE THERMAL ABLATION OF LIVER;  Surgeon: Corrie Mckusick, DO;  Location: [facility];  Service: Radiology;  Laterality: Right;  . TUBAL LIGATION  1978    Allergies: Metformin and related and Welchol [colesevelam hcl]  Medications: Prior  to Admission medications   Medication Sig Start Date End Date Taking? Authorizing Provider  Cholecalciferol (VITAMIN D3) 5000 units CAPS Take 5,000 Units by mouth daily.    [provider]  glipiZIDE (GLUCOTROL) 5 MG tablet Take 1 tablet 3 x /day with meals for Diabetes 03/17/18   Unk Pinto, MD  lactulose (CHRONULAC) 10 GM/15ML solution Take 30 mLs (20 g total) by mouth 3 (three) times daily. Patient taking differently: Take 13.3 g by mouth daily.  04/03/16   Rolene Course, PA-C  levothyroxine (SYNTHROID, LEVOTHROID) 150 MCG tablet TAKE 1 TO 1 & 1/2 (ONE TO ONE & ONE-HALF) TABLETS BY MOUTH ONCE DAILY AS DIRECTED Patient taking differently: Take 75-150 mcg by mouth See admin instructions. Take 150 mcg by mouth daily on Monday, Wednesday and Friday. Take 75 mcg by mouth daily on all other days. 12/15/17   Unk Pinto, MD  magnesium oxide (MAG-OX) 400 MG tablet Take 400 mg by mouth 3 (three) times daily.     [provider]  metFORMIN (GLUCOPHAGE-XR) 500 MG 24 hr tablet Take 2 tablets in the evening. 03/11/18   Liane Comber, NP  ONE TOUCH ULTRA TEST test strip CHECK BLOOD SUGAR ONE TIME DAILY 11/22/17   Liane Comber, NP  [facility] DELICA LANCETS 53Z MISC USE TO CHECK BLOOD SUGAR ONE TIME DAILY 12/25/17   Unk Pinto, MD  SitaGLIPtin-MetFORMIN HCl [phone] MG TB24 Take one tablet by mouth in the morning. 03/11/18   Liane Comber, NP     Family History  Problem Relation Age of Onset  . Cancer Mother        Brain cancer  . Cancer Father        Prostate cancer  . Diabetes Sister   . Hypertension Sister   . Liver disease Brother   . Diabetes Maternal Aunt   . Heart Problems Maternal Grandfather   . Cancer Paternal Grandfather        Unknown cancer  . Diabetes Sister     Social History   Socioeconomic History  . Marital status: Married    Spouse name: Not on file  . Number of children: Not on file  . Years of education: Not on file  . Highest  education level: Not on file  Occupational History  . Not on file  Social Needs  . Financial resource strain: Not on file  . Food insecurity:    Worry: Not on file    Inability: Not on file  . Transportation needs:    Medical: Not on file    Non-medical: Not on file  Tobacco Use  . Smoking status: Never Smoker  . Smokeless tobacco: Never Used  Substance and Sexual Activity  . Alcohol use: No  . Drug use: No  . Sexual activity: Yes    Birth control/protection: Post-menopausal, Surgical    Comment: Hysterectomy  Lifestyle  . Physical activity:    Days per week: Not  on file    Minutes per session: Not on file  . Stress: Not on file  Relationships  . Social connections:    Talks on phone: Not on file    Gets together: Not on file    Attends religious service: Not on file    Active member of club or organization: Not on file    Attends meetings of clubs or organizations: Not on file    Relationship status: Not on file  Other Topics Concern  . Not on file  Social History Narrative  . Not on file    ECOG Status: 0 - Asymptomatic  Review of Systems: A 12 point ROS discussed and pertinent positives are indicated in the HPI above.  All other systems are negative.  Review of Systems  Vital Signs: BP (!) 117/57   Pulse 82   Temp (!) 97.5 F (36.4 C)   SpO2 99%   Physical Exam General: 78 yo female appearing older than stated age.  Well-developed, well-nourished.  No distress. HEENT: Atraumatic, normocephalic. Glasses.  Conjugate gaze, extra-ocular motor intact. No scleral icterus or scleral injection. No lesions on external ears, nose, lips, or gums.  Oral mucosa moist, pink.  Neck: Symmetric with no goiter enlargement.  Chest/Lungs:  Symmetric chest with inspiration/expiration.  No labored breathing.   Marland Kitchen  Heart:   No JVD appreciated.  Abdomen:  Soft, NT/ND, with + bowel sounds.   Genito-urinary: Deferred Neurologic: Alert & Oriented to person, place, and time.    Normal affect. She is slight forgetful to the reason for her appointment and our treatment from October.    Moving all 4 extremities with gross sensory intact.    Imaging: Ir Radiologist Eval & Mgmt  Result Date: 04/21/2018 Please refer to notes tab for details about interventional procedure. (Op Note)   Labs:  CBC: Recent Labs    12/03/17 1438 12/17/17 1529 01/21/18 1100 04/08/18 1022  WBC 3.0* 2.4* 2.8* 2.4*  HGB 11.1* 10.6* 10.4* 10.7*  HCT 33.1* 30.9* 30.9* 31.4*  PLT 98* 81* 85* 95*    COAGS: Recent Labs    05/07/17 0916 01/21/18 1100  INR 1.1 1.11    BMP: Recent Labs    11/28/17 1108 12/03/17 1438 01/09/18 1205 01/21/18 1100 04/08/18 1022  NA 139 138 139 138 136  K 4.4 3.9 4.4 4.3 4.2  CL 105 111* 110 109 108  CO2 25 27 22  21* 20  GLUCOSE 169* 148* 286* 161* 146*  BUN 11 13 17 15 14   CALCIUM 9.2 10.0 9.7 8.9 8.9  CREATININE 0.73 0.80 0.68 0.60 0.77  GFRNONAA 80  --  >60 >60 75  GFRAA 93  --  >60 >60 87    LIVER FUNCTION TESTS: Recent Labs    11/11/17 0922  12/03/17 1438 01/09/18 1205 01/21/18 1100 04/08/18 1022  BILITOT 1.7*   < > 1.4 1.4* 1.4* 1.0  AST 64*   < > 68* 58* 64* 46*  ALT 48*   < > 49* 46* 47* 33*  ALKPHOS 76  --  88* 80 79  --   PROT 7.0   < > 6.5 6.2* 6.5 6.1  ALBUMIN 3.6  --  3.1* 3.1* 3.6  --    < > = values in this interval not displayed.    TUMOR MARKERS: No results for input(s): AFPTM, CEA, CA199, CHROMGRNA in the last 8760 hours.  Assessment and Plan:  Beverly Davis is 78 yo female SP treatment of right liver HCC,  most recently with CT guided microwave ablation 01/21/2018.    She has done well in recovery, with no complaints.   I did emphasize the need for surveillance imaging, both to watch the site of treatment and also to observe her liver for any new lesions given propensity for [facility] development.    When I discussed upcoming imaging in the next month, she told me she would prefer to delay this imaging until  "after she has paid off her hospital bill".  She is very cost/finance aware and concerned.  When I asked about when that might be, she replied the summer.   Our plan will be for repeat office visit this summer for repeat abdominal imaging, which will be, by her request, her first follow up after treatment in October.   Plan: - Office visit in 5-6 months, with contrast CT abdomen/pelvis.  (if possible she would like to have this done at our clinic because of decreased cost compared to the hospital, and perhaps Dr. Marin Olp would be willing to order) - Observe her other doctors appointments    Electronically Signed: Corrie Mckusick 04/21/2018, 1:46 PM   I spent a total of    25 Minutes in face to face in clinical consultation, greater than 50% of which was counseling/coordinating care for [facility], SP treatment with CT guided tissue ablation.

## 2018-05-12 ENCOUNTER — Telehealth: Payer: Self-pay | Admitting: *Deleted

## 2018-05-12 NOTE — Telephone Encounter (Signed)
Returned patient's phone call regarding appointments on 05/13/2018. Patient stated,"I can not come to tomorrow's appointments because I have to many medical bills. Dr. Jacqualyn Posey said I could have the CT scan done at his facility. Do you know if Dr. Marin Olp has talked to Dr. Jacqualyn Posey? I want them to talk to each other and make sure they both know what is going on with me. I cancelled appointments for 05/13/2018. She verbalized understanding.

## 2018-05-13 ENCOUNTER — Other Ambulatory Visit (HOSPITAL_BASED_OUTPATIENT_CLINIC_OR_DEPARTMENT_OTHER): Payer: PPO

## 2018-05-13 ENCOUNTER — Other Ambulatory Visit: Payer: PPO

## 2018-05-13 ENCOUNTER — Inpatient Hospital Stay: Payer: PPO | Admitting: Hematology & Oncology

## 2018-05-14 ENCOUNTER — Telehealth: Payer: Self-pay | Admitting: *Deleted

## 2018-05-14 NOTE — Telephone Encounter (Signed)
Call received from patient wanting to know if Dr. Jacqualyn Posey has spoken with Dr. Marin Olp regarding if it is ok to schedule CT scan yet with Dr. Jacqualyn Posey.  Pt reminded that CT was scheduled yesterday along with appt to see Dr. Marin Olp and she chose to cancel it and needed to call Dr. Leigh Aurora office to check with his office about scheduling CT with that office as per her request.  Pt states that she will contact Dr. Leigh Aurora office.

## 2018-05-22 ENCOUNTER — Encounter: Payer: Self-pay | Admitting: Internal Medicine

## 2018-05-27 DIAGNOSIS — C229 Malignant neoplasm of liver, not specified as primary or secondary: Secondary | ICD-10-CM | POA: Diagnosis not present

## 2018-05-27 DIAGNOSIS — E119 Type 2 diabetes mellitus without complications: Secondary | ICD-10-CM | POA: Diagnosis not present

## 2018-05-27 DIAGNOSIS — K7290 Hepatic failure, unspecified without coma: Secondary | ICD-10-CM | POA: Diagnosis not present

## 2018-06-10 ENCOUNTER — Other Ambulatory Visit: Payer: Self-pay | Admitting: Internal Medicine

## 2018-06-10 ENCOUNTER — Other Ambulatory Visit: Payer: Self-pay | Admitting: Adult Health

## 2018-06-10 ENCOUNTER — Encounter: Payer: Self-pay | Admitting: Internal Medicine

## 2018-06-10 MED ORDER — METFORMIN HCL ER 500 MG PO TB24: ORAL_TABLET | ORAL | 1 refills | Status: DC

## 2018-06-22 ENCOUNTER — Other Ambulatory Visit: Payer: Self-pay | Admitting: Internal Medicine

## 2018-06-24 ENCOUNTER — Telehealth: Payer: Self-pay

## 2018-06-24 NOTE — Telephone Encounter (Signed)
Patient called and asked about her diabetic medications.  She is to take Janumet [phone](sample) 1 tablet in the AM and Metformin XR 500 mg 2 tablets in the PM. The patient wrote down the directions.  One box given at that time(last one)

## 2018-07-28 NOTE — Progress Notes (Signed)
[name], [name], [name], [place] [address] [place], [place] [address] Telephone [phone] Telefax [phone] Annual Screening/Preventative Visit & Comprehensive Evaluation &  Examination  History of Present Illness:     This very nice 78 y.o. MWF presents for a Screening /Preventative Visit & comprehensive evaluation and management of multiple medical co-morbidities.  Patient has been followed for HTN, HLD, T2_NIDDM  and Vitamin D Deficiency. Patient has hx/o Rt Breast Cancer in 2008     Patient has hx/o NAFLD w/Cirrhosis & Hepatocellular Carcinoma treated with embolization 05/01/2016  and subsequent ablation 06/14/2016. In Oct 2019, she was treated with CT Ablation by microwave to recurrence in the Rt hepatic lobe. She's followed by Dr Marin Olp. She has been hospitalized with Hepatic Encephalopathy.      Patient is followed expectantly for labile HTN.  Patient's BP has been controlled at home and patient denies any cardiac symptoms as chest pain, palpitations, shortness of breath, dizziness or ankle swelling. Today's BP is at goal - 112/66.      Patient's hyperlipidemia is not controlled with diet and medications. Patient denies myalgias or other medication SE's. Last lipids were not at goal: Lab Results  Component Value Date   CHOL 184 04/08/2018   HDL 59 04/08/2018   LDLCALC 105 (H) 04/08/2018   TRIG 102 04/08/2018   CHOLHDL 3.1 04/08/2018      Patient has hx/o T2_NIDDM predating circa 2007 control has been sub-optimal concequent of her compliance. She alleges CBG's usually less than 150 mg%.  Patient denies reactive hypoglycemic symptoms, visual blurring, diabetic polys or paresthesias. Last A1c was not at goal: Lab Results  Component Value Date   HGBA1C 7.1 (H) 04/08/2018      Patient was initiated on Thyroid Replacement in  2000.              Finally, patient has history of Vitamin D Deficiency ("35" / 2015)  and last Vitamin D was at goal: Lab Results  Component Value Date   VD25OH 94 11/28/2017    Current Outpatient Medications on File Prior to Visit  Medication Sig  . Cholecalciferol (VITAMIN D3) 5000 units CAPS Take 5,000 Units by mouth daily.  Marland Kitchen glipiZIDE (GLUCOTROL) 5 MG tablet Take 1 tablet 3 x /day with meals for Diabetes  . lactulose (CHRONULAC) 10 GM/15ML solution Take 30 mLs (20 g total) by mouth 3 (three) times daily. (Patient taking differently: Take 13.3 g by mouth daily. )  . magnesium oxide (MAG-OX) 400 MG tablet Take 400 mg by mouth 3 (three) times daily.   . metFORMIN (GLUCOPHAGE-XR) 500 MG 24 hr tablet Take 2 tablets  2 x /day with meals for Diabetes  . ONE TOUCH ULTRA TEST test strip CHECK BLOOD SUGAR ONE TIME DAILY  . ONETOUCH DELICA LANCETS 16F MISC USE TO CHECK BLOOD SUGAR ONE TIME DAILY  . sitaGLIPtin-metformin (JANUMET) 50-1000 MG tablet Take 1 tablet by mouth daily. Takes in the morning. Gets samples.   No current facility-administered medications on file prior to visit.    Allergies  Allergen Reactions  . Metformin And Related Other (See Comments)    Unknown  . Welchol [Colesevelam Hcl] Other (See Comments)    Unknown   Past Medical History:  Diagnosis Date  . Breast cancer ([place]) 05/2006   Right breast cancer  . Cirrhosis ([place])   . Diabetes mellitus without complication ([place])  type 2  . Fibroids    PT IS UNAWARE   . Hepatocellular carcinoma ([place]) 05/08/2016  . Hypothyroidism   . Osteopenia   . Personal history of radiation therapy 2008  . Thrombocytopenia ([place])   . Thyroid disease    Health Maintenance  Topic Date Due  . FOOT EXAM  05/07/2018  . URINE MICROALBUMIN  05/07/2018  . OPHTHALMOLOGY EXAM  08/28/2018  . HEMOGLOBIN A1C  10/07/2018  . INFLUENZA VACCINE  10/24/2018  . TETANUS/TDAP  06/11/2022  . DEXA SCAN  Completed  . PNA vac Low Risk Adult   Completed   Immunization History  Administered Date(s) Administered  . Influenza Split 03/06/2011, 01/06/2013  . Influenza, High Dose Seasonal PF 01/12/2014, 12/14/2014, 12/27/2015, 01/22/2017, 12/17/2017  . Pneumococcal Conjugate-13 01/12/2014  . Pneumococcal Polysaccharide-23 08/30/2014  . Tdap 06/10/2012   - Colon - 06/20/2004 -  Dr Deatra Ina - Diverticulosis - recc f/u in 2013 - Saw Dr Benson Norway  01/242018 for NASH / ? Colon done   Last MGM - 06/04/2017 and received letter to schedule   Past Surgical History:  Procedure Laterality Date  . ABDOMINAL HYSTERECTOMY     TAH BSO  . BREAST LUMPECTOMY Right 2008  . BREAST LUMPECTOMY WITH AXILLARY LYMPH NODE BIOPSY Right 06/25/2006  . CHOLECYSTECTOMY    . IR GENERIC HISTORICAL  03/28/2016   IR RADIOLOGIST EVAL & MGMT 03/28/2016 Corrie Mckusick, DO [facility]  . IR GENERIC HISTORICAL  05/01/2016   IR ANGIOGRAM VISCERAL SELECTIVE 05/01/2016 Corrie Mckusick, DO [facility]  . IR GENERIC HISTORICAL  05/01/2016   IR ANGIOGRAM VISCERAL SELECTIVE 05/01/2016 Corrie Mckusick, DO [facility]  . IR GENERIC HISTORICAL  05/01/2016   IR ANGIOGRAM SELECTIVE EACH ADDITIONAL VESSEL 05/01/2016 Corrie Mckusick, DO [facility]  . IR GENERIC HISTORICAL  05/01/2016   IR ANGIOGRAM SELECTIVE EACH ADDITIONAL VESSEL 05/01/2016 Corrie Mckusick, DO [facility]  . IR GENERIC HISTORICAL  05/01/2016   IR ANGIOGRAM SELECTIVE EACH ADDITIONAL VESSEL 05/01/2016 Corrie Mckusick, DO [facility]  . IR GENERIC HISTORICAL  05/01/2016   IR EMBO TUMOR ORGAN ISCHEMIA INFARCT INC GUIDE ROADMAPPING 05/01/2016 Corrie Mckusick, DO [facility]  . IR GENERIC HISTORICAL  05/01/2016   IR CT SPINE LTD 05/01/2016 Corrie Mckusick, DO [facility]  . IR GENERIC HISTORICAL  05/01/2016   IR US GUIDE VASC ACCESS RIGHT 05/01/2016 Corrie Mckusick, DO [facility]  . IR GENERIC HISTORICAL  05/01/2016   IR ANGIOGRAM SELECTIVE EACH ADDITIONAL VESSEL 05/01/2016 Corrie Mckusick, DO [facility]  . IR RADIOLOGIST EVAL & MGMT  07/31/2016  . IR  RADIOLOGIST EVAL & MGMT  12/25/2016  . IR RADIOLOGIST EVAL & MGMT  12/10/2017  . IR RADIOLOGIST EVAL & MGMT  04/21/2018  . RADIOFREQUENCY ABLATION N/A 06/14/2016   Procedure: LIVER MICROWAVE THERMAL  ABLATION;  Surgeon: Corrie Mckusick, DO;  Location: [facility];  Service: Anesthesiology;  Laterality: N/A;  . RADIOLOGY WITH ANESTHESIA Right 01/21/2018   Procedure: CT WITH ANESTHESIA/MICROWAVE THERMAL ABLATION OF LIVER;  Surgeon: Corrie Mckusick, DO;  Location: [facility];  Service: Radiology;  Laterality: Right;  . TUBAL LIGATION  1978   Family History  Problem Relation Age of Onset  . Cancer Mother        Brain cancer  . Cancer Father        Prostate cancer  . Diabetes Sister   . Hypertension Sister   . Liver disease Brother   . Diabetes Maternal Aunt   . Heart Problems Maternal Grandfather   .  Cancer Paternal Grandfather        Unknown cancer  . Diabetes Sister    Social History   Tobacco Use  . Smoking status: Never Smoker  . Smokeless tobacco: Never Used  Substance Use Topics  . Alcohol use: No  . Drug use: No    ROS Constitutional: Denies fever, chills, weight loss/gain, headaches, insomnia,  night sweats, and change in appetite. Does c/o fatigue. Eyes: Denies redness, blurred vision, diplopia, discharge, itchy, watery eyes.  ENT: Denies discharge, congestion, post nasal drip, epistaxis, sore throat, earache, hearing loss, dental pain, Tinnitus, Vertigo, Sinus pain, snoring.  Cardio: Denies chest pain, palpitations, irregular heartbeat, syncope, dyspnea, diaphoresis, orthopnea, PND, claudication, edema Respiratory: denies cough, dyspnea, DOE, pleurisy, hoarseness, laryngitis, wheezing.  Gastrointestinal: Denies dysphagia, heartburn, reflux, water brash, pain, cramps, nausea, vomiting, bloating, diarrhea, constipation, hematemesis, melena, hematochezia, jaundice, hemorrhoids Genitourinary: Denies dysuria, frequency, urgency, nocturia, hesitancy, discharge, hematuria, flank pain Breast:  Breast lumps, nipple discharge, bleeding.  Musculoskeletal: Denies arthralgia, myalgia, stiffness, Jt. Swelling, pain, limp, and strain/sprain. Denies falls. Skin: Denies puritis, rash, hives, warts, acne, eczema, changing in skin lesion Neuro: No weakness, tremor, incoordination, spasms, paresthesia, pain Psychiatric: Denies confusion, memory loss, sensory loss. Denies Depression. Endocrine: Denies change in weight, skin, hair change, nocturia, and paresthesia, diabetic polys, visual blurring, hyper / hypo glycemic episodes.  Heme/Lymph: No excessive bleeding, bruising, enlarged lymph nodes.  Physical Exam  BP 112/66   Pulse 72   Temp (!) 97 F (36.1 C)   Resp 16   Ht 5' 2"  (1.575 m)   Wt 144 lb (65.3 kg)   BMI 26.34 kg/m   General Appearance: Well nourished, well groomed and in no apparent distress.  Eyes: PERRLA, EOMs, conjunctiva no swelling or erythema, normal fundi and vessels. Sinuses: No frontal/maxillary tenderness ENT/Mouth: EACs patent / TMs  nl. Nares clear without erythema, swelling, mucoid exudates. Oral hygiene is good. No erythema, swelling, or exudate. Tongue normal, non-obstructing. Tonsils not swollen or erythematous. Hearing normal.  Neck: Supple, thyroid not palpable. No bruits, nodes or JVD. Respiratory: Respiratory effort normal.  BS equal and clear bilateral without rales, rhonci, wheezing or stridor. Cardio: Heart sounds are normal with regular rate and rhythm and no murmurs, rubs or gallops. Peripheral pulses are normal and equal bilaterally without edema. No aortic or femoral bruits. Chest: symmetric with normal excursions and percussion. Breasts: Symmetric, without lumps, nipple discharge, retractions, or fibrocystic changes.  Abdomen: Flat, soft with bowel sounds active. Nontender, no guarding, rebound, hernias, masses, or organomegaly.  Lymphatics: Non tender without lymphadenopathy.  Musculoskeletal: Full ROM all peripheral extremities, joint stability,  5/5 strength, and normal gait. Skin: Warm and dry without rashes, lesions, cyanosis, clubbing or  ecchymosis.  Neuro: Cranial nerves intact, reflexes equal bilaterally. Normal muscle tone, no cerebellar symptoms. Sensation intact to touch, vibratory and Monofilament to the toes bilaterally. Pysch: Alert and oriented X 3, normal affect, Insight and Judgment appropriate.   Assessment and Plan  1. Annual Preventative Screening Examination  2. Essential hypertension  - EKG 12-Lead - Urinalysis, Routine w reflex microscopic - Microalbumin / creatinine urine ratio - CBC with Differential/Platelet - COMPLETE METABOLIC PANEL WITH GFR - Magnesium - TSH  3. Hyperlipidemia, mixed  - EKG 12-Lead - Lipid panel - TSH  4. Vitamin D deficiency  - VITAMIN D 25 Hydroxyl  5. Type 2 diabetes mellitus with stage 2 chronic kidney disease, without long-term current use of insulin (HCC)  - Given Sx's Januvia 100 mg x 3 month  supply & adv=ised to increase Metformin 500 mg XR to 2 tabs 2 x /day   - EKG 12-Lead - HM DIABETES FOOT EXAM - LOW EXTREMITY NEUR EXAM DOCUM - Insulin, random - Hemoglobin A1c  6. Hypothyroidism  - TSH  7. Hepatocellular carcinoma (HCC)  - Ammonia  8. History of right breast cancer  9. History of hepatocellular carcinoma  - Protime-INR - COMPLETE METABOLIC PANEL WITH GFR - AFP tumor marker  10. Liver cirrhosis secondary to NASH (HCC)  - Protime-INR - COMPLETE METABOLIC PANEL WITH GFR - Ammonia  11. B12 deficiency  - Vitamin B12  12. Screening for ischemic heart disease  - EKG 12-Lead  13. Screening for colorectal cancer  - POC Hemoccult Bld/Stl   14. FHx: heart disease  15. Medication management  - Urinalysis, Routine w reflex microscopic - Microalbumin / creatinine urine ratio - Protime-INR - CBC with Differential/Platelet - COMPLETE METABOLIC PANEL WITH GFR - Magnesium - Lipid panel - TSH - Insulin, random - Hemoglobin A1c -  VITAMIN D 25 Hydroxyl - Ammonia       Patient was counseled in prudent diet to achieve/maintain BMI less than 25 for weight control, BP monitoring, regular exercise and medications. Discussed med's effects and SE's. Screening labs and tests as requested with regular follow-up as recommended. I discussed the assessment and treatment plan as above with the patient. The patient was provided an opportunity to ask questions and all were answered. The patient agreed with the plan and demonstrated an understanding of the instructions.  I provided  over 40 minutes of exam, counseling, chart review and  complex critical decision making.       Kirtland Bouchard, MD

## 2018-07-29 ENCOUNTER — Other Ambulatory Visit: Payer: Self-pay

## 2018-07-29 ENCOUNTER — Encounter: Payer: Self-pay | Admitting: Internal Medicine

## 2018-07-29 ENCOUNTER — Ambulatory Visit (INDEPENDENT_AMBULATORY_CARE_PROVIDER_SITE_OTHER): Payer: PPO | Admitting: Internal Medicine

## 2018-07-29 VITALS — BP 112/66 | HR 72 | Temp 97.0°F | Resp 16 | Ht 62.0 in | Wt 144.0 lb

## 2018-07-29 DIAGNOSIS — Z79899 Other long term (current) drug therapy: Secondary | ICD-10-CM

## 2018-07-29 DIAGNOSIS — I10 Essential (primary) hypertension: Secondary | ICD-10-CM

## 2018-07-29 DIAGNOSIS — E782 Mixed hyperlipidemia: Secondary | ICD-10-CM

## 2018-07-29 DIAGNOSIS — K7581 Nonalcoholic steatohepatitis (NASH): Secondary | ICD-10-CM

## 2018-07-29 DIAGNOSIS — Z136 Encounter for screening for cardiovascular disorders: Secondary | ICD-10-CM | POA: Diagnosis not present

## 2018-07-29 DIAGNOSIS — E559 Vitamin D deficiency, unspecified: Secondary | ICD-10-CM

## 2018-07-29 DIAGNOSIS — Z8249 Family history of ischemic heart disease and other diseases of the circulatory system: Secondary | ICD-10-CM | POA: Diagnosis not present

## 2018-07-29 DIAGNOSIS — E1122 Type 2 diabetes mellitus with diabetic chronic kidney disease: Secondary | ICD-10-CM

## 2018-07-29 DIAGNOSIS — K7460 Unspecified cirrhosis of liver: Secondary | ICD-10-CM

## 2018-07-29 DIAGNOSIS — Z0001 Encounter for general adult medical examination with abnormal findings: Secondary | ICD-10-CM

## 2018-07-29 DIAGNOSIS — Z0000 Encounter for general adult medical examination without abnormal findings: Secondary | ICD-10-CM | POA: Diagnosis not present

## 2018-07-29 DIAGNOSIS — N182 Chronic kidney disease, stage 2 (mild): Secondary | ICD-10-CM

## 2018-07-29 DIAGNOSIS — Z1211 Encounter for screening for malignant neoplasm of colon: Secondary | ICD-10-CM

## 2018-07-29 DIAGNOSIS — Z1212 Encounter for screening for malignant neoplasm of rectum: Secondary | ICD-10-CM

## 2018-07-29 DIAGNOSIS — Z8505 Personal history of malignant neoplasm of liver: Secondary | ICD-10-CM | POA: Insufficient documentation

## 2018-07-29 DIAGNOSIS — C220 Liver cell carcinoma: Secondary | ICD-10-CM

## 2018-07-29 DIAGNOSIS — Z853 Personal history of malignant neoplasm of breast: Secondary | ICD-10-CM

## 2018-07-29 DIAGNOSIS — E039 Hypothyroidism, unspecified: Secondary | ICD-10-CM

## 2018-07-29 DIAGNOSIS — E538 Deficiency of other specified B group vitamins: Secondary | ICD-10-CM | POA: Insufficient documentation

## 2018-07-29 MED ORDER — LEVOTHYROXINE SODIUM 112 MCG PO TABS: ORAL_TABLET | ORAL | 3 refills | Status: DC

## 2018-07-29 NOTE — Patient Instructions (Addendum)
- When you use up your Janumet 50/1000,  - then  - Start the Januvia 100 mg tablet every morning  &   - Increase your Metformin 500 mg XR to 4 tablets / day  ie, take 2 tablets twice /day     (2 tablets in the morning   &   2 tablets at night - take with meals)  +++++++++++++++++++++++++++++++++++++++++++++++++++++++++++++++++++  Also , changing your Thyroxine medicine  To:  LevoThyroxine 163mg tablet so   you'll only take 1 whole tablet every morning  Take 1 tablet daily on an empty stomach   with only water for 30 minutes &   no Antacid meds, Calcium or Magnesium for 4 hours   & avoid Biotin  Coronavirus (COVID-19) Are you at risk?  Are you at risk for the Coronavirus (COVID-19)?  To be considered HIGH RISK for Coronavirus (COVID-19), you have to meet the following criteria:  . [place] to [place] or in [place] or [place] and have fever, cough, and shortness of breath within the last 2 weeks of travel OR . Been in close contact with a person diagnosed with COVID-19 within the last 2 weeks and have  . fever, cough,and shortness of breath .  . IF YOU DO NOT MEET THESE CRITERIA, YOU ARE CONSIDERED LOW RISK FOR COVID-19.  What to do if you are HIGH RISK for COVID-19?  .Marland KitchenIf you are having a medical emergency, call 911. . Seek medical care right away. Before you go to a doctor's office, urgent care or emergency department, .  call ahead and tell them about your recent travel, contact with someone diagnosed with COVID-19  .  and your symptoms.  . You should receive instructions from your physician's office regarding next steps of care.  . When you arrive at healthcare provider, tell the healthcare staff immediately you have returned from  . visiting [place] or traveled in [place] the  last two weeks or you have been in close contact with a person diagnosed with  . COVID-19 in the last 2 weeks.   . Tell the health care staff about your symptoms: fever, cough and shortness of breath. . After you have been seen by a medical provider, you will be either: o Tested for (COVID-19) and discharged home on quarantine except to seek medical care if  o symptoms worsen, and asked to  - Stay home and avoid contact with others until you get your results (4-5 days)  - Avoid travel on public transportation if possible (such as bus, train, or airplane) or o Sent to the Emergency Department by EMS for evaluation, COVID-19 testing  and  o possible admission depending on your condition and test results.  What to do if you are LOW RISK for COVID-19?  Reduce your risk of any infection by using the same precautions used for avoiding the common cold or flu:  .Marland KitchenWash your hands often with soap and warm water for at least 20 seconds.  If soap and water are not readily available,  . use an alcohol-based hand sanitizer with at least 60% alcohol.  . If coughing or sneezing, cover your mouth and nose by coughing or sneezing into the elbow areas of your shirt or coat, .  into a tissue or into  your sleeve (not your hands). . Avoid shaking hands with others and consider head nods or verbal greetings only. . Avoid touching your eyes, nose, or mouth with unwashed hands.  . Avoid close contact with people who are sick. . Avoid places or events with large numbers of people in one location, like concerts or sporting events. . Carefully consider travel plans you have or are making. . If you are planning any travel outside or inside the [place], visit the CDC's Travelers' Health webpage for the latest health notices. . If you have some symptoms but not all symptoms, continue to monitor at home and seek medical attention  . if your symptoms worsen. . If you are having a medical emergency, call  911. >>>>>>>>>>>>>>>>>>>>>>>>>>>>>>>>>>>>>>>>>>>>>>>>>>>>>>> We Do NOT Approve of  [facility] [place] Soliciting Our Patients  To Do Home Visits  & We Do NOT Approve of [employer] > > > > > > > > > > > > > > > > > > > > > > > > > > > > > > > > > > >  > > > >   Preventive Care for Adults  A healthy lifestyle and preventive care can promote health and wellness. Preventive health guidelines for men include the following key practices:  A routine yearly physical is a good way to check with your health care provider about your health and preventative screening. It is a chance to share any concerns and updates on your health and to receive a thorough exam.  Visit your dentist for a routine exam and preventative care every 6 months. Brush your teeth twice a day and floss once a day. Good oral hygiene prevents tooth decay and gum disease.  The frequency of eye exams is based on your age, health, family medical history, use of contact lenses, and other factors. Follow your health care provider's recommendations for frequency of eye exams.  Eat a healthy diet. Foods such as vegetables, fruits, whole grains, low-fat dairy products, and lean protein foods contain the nutrients you need without too many calories. Decrease your intake of foods high in solid fats, added sugars, and salt. Eat the right amount of calories for you. Get information about a proper diet from your health care provider, if necessary.  Regular physical exercise is one of the most important things you can do for your health. Most adults should get at least 150 minutes of moderate-intensity exercise (any activity that increases your heart rate and causes you to sweat) each week. In addition, most adults need muscle-strengthening exercises on 2 or more days a week.  Maintain a healthy weight. The body mass index (BMI) is a screening tool to identify possible weight problems. It provides an estimate of body fat  based on height and weight. Your health care provider can find your BMI and can help you achieve or maintain a healthy weight. For adults 20 years and older:  A BMI below 18.5 is considered underweight.  A BMI of 18.5 to 24.9 is normal.  A BMI of 25 to 29.9 is considered overweight.  A BMI of 30 and above is considered obese.  Maintain normal blood lipids and cholesterol levels by exercising and minimizing your intake of saturated fat. Eat a balanced diet with plenty of fruit and vegetables. Blood tests for lipids and cholesterol should begin at age 27 and be repeated every 5 years. If your lipid or cholesterol levels are high, you are over 50, or you are  at high risk for heart disease, you may need your cholesterol levels checked more frequently. Ongoing high lipid and cholesterol levels should be treated with medicines if diet and exercise are not working.  If you smoke, find out from your health care provider how to quit. If you do not use tobacco, do not start.  Lung cancer screening is recommended for adults aged 26-80 years who are at high risk for developing lung cancer because of a history of smoking. A yearly low-dose CT scan of the lungs is recommended for people who have at least a 30-pack-year history of smoking and are a current smoker or have quit within the past 15 years. A pack year of smoking is smoking an average of 1 pack of cigarettes a day for 1 year (for example: 1 pack a day for 30 years or 2 packs a day for 15 years). Yearly screening should continue until the smoker has stopped smoking for at least 15 years. Yearly screening should be stopped for people who develop a health problem that would prevent them from having lung cancer treatment.  If you choose to drink alcohol, do not have more than 2 drinks per day. One drink is considered to be 12 ounces (355 mL) of beer, 5 ounces (148 mL) of wine, or 1.5 ounces (44 mL) of liquor.  Avoid use of street drugs. Do not share  needles with anyone. Ask for help if you need support or instructions about stopping the use of drugs.  High blood pressure causes heart disease and increases the risk of stroke. Your blood pressure should be checked at least every 1-2 years. Ongoing high blood pressure should be treated with medicines, if weight loss and exercise are not effective.  If you are 9-22 years old, ask your health care provider if you should take aspirin to prevent heart disease.  Diabetes screening involves taking a blood sample to check your fasting blood sugar level. Testing should be considered at a younger age or be carried out more frequently if you are overweight and have at least 1 risk factor for diabetes.  Colorectal cancer can be detected and often prevented. Most routine colorectal cancer screening begins at the age of 39 and continues through age 55. However, your health care provider may recommend screening at an earlier age if you have risk factors for colon cancer. On a yearly basis, your health care provider may provide home test kits to check for hidden blood in the stool. Use of a small camera at the end of a tube to directly examine the colon (sigmoidoscopy or colonoscopy) can detect the earliest forms of colorectal cancer. Talk to your health care provider about this at age 73, when routine screening begins. Direct exam of the colon should be repeated every 5-10 years through age 29, unless early forms of precancerous polyps or small growths are found.  Hepatitis C blood testing is recommended for all people born from [birth year] through 1964 and any individual with known risks for hepatitis C.  Screening for abdominal aortic aneurysm (AAA)  by ultrasound is recommended for people who have history of high blood pressure or who are current or former smokers.  Healthy men should  receive prostate-specific antigen (PSA) blood tests as part of routine cancer screening. Talk with your health care provider about  prostate cancer screening.  Testicular cancer screening is  recommended for adult males. Screening includes self-exam, a health care provider exam, and other screening tests. Consult with your health  care provider about any symptoms you have or any concerns you have about testicular cancer.  Use sunscreen. Apply sunscreen liberally and repeatedly throughout the day. You should seek shade when your shadow is shorter than you. Protect yourself by wearing long sleeves, pants, a wide-brimmed hat, and sunglasses year round, whenever you are outdoors.  Once a month, do a whole-body skin exam, using a mirror to look at the skin on your back. Tell your health care provider about new moles, moles that have irregular borders, moles that are larger than a pencil eraser, or moles that have changed in shape or color.  Stay current with required vaccines (immunizations).  Influenza vaccine. All adults should be immunized every year.  Tetanus, diphtheria, and acellular pertussis (Td, Tdap) vaccine. An adult who has not previously received Tdap or who does not know his vaccine status should receive 1 dose of Tdap. This initial dose should be followed by tetanus and diphtheria toxoids (Td) booster doses every 10 years. Adults with an unknown or incomplete history of completing a 3-dose immunization series with Td-containing vaccines should begin or complete a primary immunization series including a Tdap dose. Adults should receive a Td booster every 10 years.  Zoster vaccine. One dose is recommended for adults aged 23 years or older unless certain conditions are present.    PREVNAR - Pneumococcal 13-valent conjugate (PCV13) vaccine. When indicated, a person who is uncertain of his immunization history and has no record of immunization should receive the PCV13 vaccine. An adult aged 41 years or older who has certain medical conditions and has not been previously immunized should receive 1 dose of PCV13 vaccine. This  PCV13 should be followed with a dose of pneumococcal polysaccharide (PPSV23) vaccine. The PPSV23 vaccine dose should be obtained 1 or more year(s)after the dose of PCV13 vaccine. An adult aged 71 years or older who has certain medical conditions and previously received 1 or more doses of PPSV23 vaccine should receive 1 dose of PCV13. The PCV13 vaccine dose should be obtained 1 or more years after the last PPSV23 vaccine dose.    PNEUMOVAX - Pneumococcal polysaccharide (PPSV23) vaccine. When PCV13 is also indicated, PCV13 should be obtained first. All adults aged 39 years and older should be immunized. An adult younger than age 11 years who has certain medical conditions should be immunized. Any person who resides in a nursing home or long-term care facility should be immunized. An adult smoker should be immunized. People with an immunocompromised condition and certain other conditions should receive both PCV13 and PPSV23 vaccines. People with human immunodeficiency virus (HIV) infection should be immunized as soon as possible after diagnosis. Immunization during chemotherapy or radiation therapy should be avoided. Routine use of PPSV23 vaccine is not recommended for American Indians, [place], or people younger than 65 years unless there are medical conditions that require PPSV23 vaccine. When indicated, people who have unknown immunization and have no record of immunization should receive PPSV23 vaccine. One-time revaccination 5 years after the first dose of PPSV23 is recommended for people aged 19-64 years who have chronic kidney failure, nephrotic syndrome, asplenia, or immunocompromised conditions. People who received 1-2 doses of PPSV23 before age 97 years should receive another dose of PPSV23 vaccine at age 38 years or later if at least 5 years have passed since the previous dose. Doses of PPSV23 are not needed for people immunized with PPSV23 at or after age 46 years.    Hepatitis A vaccine.  Adults who wish to  be protected from this disease, have certain high-risk conditions, work with hepatitis A-infected animals, work in hepatitis A research labs, or travel to or work in countries with a high rate of hepatitis A should be immunized. Adults who were previously unvaccinated and who anticipate close contact with an international adoptee during the first 60 days after arrival in the [place] from a country with a high rate of hepatitis A should be immunized.    Hepatitis B vaccine. Adults should be immunized if they wish to be protected from this disease, have certain high-risk conditions, may be exposed to blood or other infectious body fluids, are household contacts or sex partners of hepatitis B positive people, are clients or workers in certain care facilities, or travel to or work in countries with a high rate of hepatitis B.   Preventive Service / Frequency   Ages 18 and over  Blood pressure check.  Lipid and cholesterol check.  Lung cancer screening. / Every year if you are aged 60-80 years and have a 30-pack-year history of smoking and currently smoke or have quit within the past 15 years. Yearly screening is stopped once you have quit smoking for at least 15 years or develop a health problem that would prevent you from having lung cancer treatment.  Fecal occult blood test (FOBT) of stool. You may not have to do this test if you get a colonoscopy every 10 years.  Flexible sigmoidoscopy** or colonoscopy.** / Every 5 years for a flexible sigmoidoscopy or every 10 years for a colonoscopy beginning at age 68 and continuing until age 20.  Hepatitis C blood test.** / For all people born from [birth year] through 1964 and any individual with known risks for hepatitis C.  Abdominal aortic aneurysm (AAA) screening./ Screening current or former smokers or have Hypertension.  Skin self-exam. / Monthly.  Influenza vaccine. / Every year.  Tetanus, diphtheria, and acellular pertussis  (Tdap/Td) vaccine.** / 1 dose of Td every 10 years.   Zoster vaccine.** / 1 dose for adults aged 40 years or older.         Pneumococcal 13-valent conjugate (PCV13) vaccine.    Pneumococcal polysaccharide (PPSV23) vaccine.     Hepatitis A vaccine.** / Consult your health care provider.  Hepatitis B vaccine.** / Consult your health care provider. Screening for abdominal aortic aneurysm (AAA)  by ultrasound is recommended for people who have history of high blood pressure or who are current or former smokers. ++++++++++ Recommend Adult Low Dose Aspirin or  coated  Aspirin 81 mg daily  To reduce risk of Colon Cancer 20 %,  Skin Cancer 26 % ,  Malignant Melanoma 46%  and  Pancreatic cancer 60% ++++++++++++++++++++++ Vitamin D goal  is between 70-100.  Please make sure that you are taking your Vitamin D as directed.  It is very important as a natural anti-inflammatory  helping hair, skin, and nails, as well as reducing stroke and heart attack risk.  It helps your bones and helps with mood. It also decreases numerous cancer risks so please take it as directed.  Low Vit D is associated with a 200-300% higher risk for CANCER  and 200-300% higher risk for HEART   ATTACK  &  STROKE.   .....................................Marland Kitchen It is also associated with higher death rate at younger ages,  autoimmune diseases like Rheumatoid arthritis, Lupus, Multiple Sclerosis.    Also many other serious conditions, like depression, Alzheimer's Dementia, infertility, muscle aches, fatigue, fibromyalgia - just to name a  few. ++++++++++++++++++++++ Recommend the book "The END of DIETING" by Dr Excell Seltzer  & the book "The END of DIABETES " by Dr Excell Seltzer At [email or web address] - get book & Audio CD's    Being diabetic has a  300% increased risk for heart attack, stroke, cancer, and alzheimer- type vascular dementia. It is very important that you work harder with diet by avoiding all foods that are white. Avoid  white rice (brown & wild rice is OK), white potatoes (sweetpotatoes in moderation is OK), White bread or wheat bread or anything made out of white flour like bagels, donuts, rolls, buns, biscuits, cakes, pastries, cookies, pizza crust, and pasta (made from white flour & egg whites) - vegetarian pasta or spinach or wheat pasta is OK. Multigrain breads like Arnold's or Pepperidge [employer], or multigrain sandwich thins or flatbreads.  Diet, exercise and weight loss can reverse and cure diabetes in the early stages.  Diet, exercise and weight loss is very important in the control and prevention of complications of diabetes which affects every system in your body, ie. Brain - dementia/stroke, eyes - glaucoma/blindness, heart - heart attack/heart failure, kidneys - dialysis, stomach - gastric paralysis, intestines - malabsorption, nerves - severe painful neuritis, circulation - gangrene & loss of a leg(s), and finally cancer and Alzheimers.    I recommend avoid fried & greasy foods,  sweets/candy, white rice (brown or wild rice or Quinoa is OK), white potatoes (sweet potatoes are OK) - anything made from white flour - bagels, doughnuts, rolls, buns, biscuits,white and wheat breads, pizza crust and traditional pasta made of white flour & egg white(vegetarian pasta or spinach or wheat pasta is OK).  Multi-grain bread is OK - like multi-grain flat bread or sandwich thins. Avoid alcohol in excess. Exercise is also important.    Eat all the vegetables you want - avoid meat, especially red meat and dairy - especially cheese.  Cheese is the most concentrated form of trans-fats which is the worst thing to clog up our arteries. Veggie cheese is OK which can be found in the fresh produce section at [employer] or [employer] or [employer]  ++++++++++++++++++++++ DASH Eating Plan  DASH stands for "Dietary Approaches to Stop Hypertension."   The DASH eating plan is a healthy eating plan that has been shown to reduce high  blood pressure (hypertension). Additional health benefits may include reducing the risk of type 2 diabetes mellitus, heart disease, and stroke. The DASH eating plan may also help with weight loss. WHAT DO I NEED TO KNOW ABOUT THE DASH EATING PLAN? For the DASH eating plan, you will follow these general guidelines:  Choose foods with a percent daily value for sodium of less than 5% (as listed on the food label).  Use salt-free seasonings or herbs instead of table salt or sea salt.  Check with your health care provider or pharmacist before using salt substitutes.  Eat lower-sodium products, often labeled as "lower sodium" or "no salt added."  Eat fresh foods.  Eat more vegetables, fruits, and low-fat dairy products.  Choose whole grains. Look for the word "whole" as the first word in the ingredient list.  Choose fish   Limit sweets, desserts, sugars, and sugary drinks.  Choose heart-healthy fats.  Eat veggie cheese   Eat more home-cooked food and less restaurant, buffet, and fast food.  Limit fried foods.  Cook foods using methods other than frying.  Limit canned vegetables. If you do use them, rinse them well to decrease  the sodium.  When eating at a restaurant, ask that your food be prepared with less salt, or no salt if possible.                      WHAT FOODS CAN I EAT? Read Dr Fara Olden Fuhrman's books on The End of Dieting & The End of Diabetes  Grains Whole grain or whole wheat bread. Brown rice. Whole grain or whole wheat pasta. Quinoa, bulgur, and whole grain cereals. Low-sodium cereals. Corn or whole wheat flour tortillas. Whole grain cornbread. Whole grain crackers. Low-sodium crackers.  Vegetables Fresh or frozen vegetables (raw, steamed, roasted, or grilled). Low-sodium or reduced-sodium tomato and vegetable juices. Low-sodium or reduced-sodium tomato sauce and paste. Low-sodium or reduced-sodium canned vegetables.   Fruits All fresh, canned (in natural juice), or  frozen fruits.  Protein Products  All fish and seafood.  Dried beans, peas, or lentils. Unsalted nuts and seeds. Unsalted canned beans.  Dairy Low-fat dairy products, such as skim or 1% milk, 2% or reduced-fat cheeses, low-fat ricotta or cottage cheese, or plain low-fat yogurt. Low-sodium or reduced-sodium cheeses.  Fats and Oils Tub margarines without trans fats. Light or reduced-fat mayonnaise and salad dressings (reduced sodium). Avocado. Safflower, olive, or canola oils. Natural peanut or almond butter.  Other Unsalted popcorn and pretzels. The items listed above may not be a complete list of recommended foods or beverages. Contact your dietitian for more options.  ++++++++++++++++++++  WHAT FOODS ARE NOT RECOMMENDED? Grains/ White flour or wheat flour White bread. White pasta. White rice. Refined cornbread. Bagels and croissants. Crackers that contain trans fat.  Vegetables  Creamed or fried vegetables. Vegetables in a . Regular canned vegetables. Regular canned tomato sauce and paste. Regular tomato and vegetable juices.  Fruits Dried fruits. Canned fruit in light or heavy syrup. Fruit juice.  Meat and Other Protein Products Meat in general - RED meat & White meat.  Fatty cuts of meat. Ribs, chicken wings, all processed meats as bacon, sausage, bologna, salami, fatback, hot dogs, bratwurst and packaged luncheon meats.  Dairy Whole or 2% milk, cream, half-and-half, and cream cheese. Whole-fat or sweetened yogurt. Full-fat cheeses or blue cheese. Non-dairy creamers and whipped toppings. Processed cheese, cheese spreads, or cheese curds.  Condiments Onion and garlic salt, seasoned salt, table salt, and sea salt. Canned and packaged gravies. Worcestershire sauce. Tartar sauce. Barbecue sauce. Teriyaki sauce. Soy sauce, including reduced sodium. Steak sauce. Fish sauce. Oyster sauce. Cocktail sauce. Horseradish. Ketchup and mustard. Meat flavorings and tenderizers. Bouillon cubes.  Hot sauce. Tabasco sauce. Marinades. Taco seasonings. Relishes.  Fats and Oils Butter, stick margarine, lard, shortening and bacon fat. Coconut, palm kernel, or palm oils. Regular salad dressings.  Pickles and olives. Salted popcorn and pretzels.  The items listed above may not be a complete list of foods and beverages to avoid.

## 2018-07-30 LAB — COMPLETE METABOLIC PANEL WITH GFR
AG Ratio: 1.3 (calc) (ref 1.0–2.5)
ALT: 38 U/L — ABNORMAL HIGH (ref 6–29)
AST: 57 U/L — ABNORMAL HIGH (ref 10–35)
Albumin: 3.5 g/dL — ABNORMAL LOW (ref 3.6–5.1)
Alkaline phosphatase (APISO): 103 U/L (ref 37–153)
BUN: 13 mg/dL (ref 7–25)
CO2: 21 mmol/L (ref 20–32)
Calcium: 8.9 mg/dL (ref 8.6–10.4)
Chloride: 108 mmol/L (ref 98–110)
Creat: 0.62 mg/dL (ref 0.60–0.93)
GFR, Est African American: 102 mL/min/{1.73_m2} (ref >=60)
GFR, Est Non African American: 88 mL/min/{1.73_m2} (ref >=60)
Globulin: 2.7 g/dL (calc) (ref 1.9–3.7)
Glucose, Bld: 132 mg/dL — ABNORMAL HIGH (ref 65–99)
Potassium: 4.2 mmol/L (ref 3.5–5.3)
Sodium: 138 mmol/L (ref 135–146)
Total Bilirubin: 1.3 mg/dL — ABNORMAL HIGH (ref 0.2–1.2)
Total Protein: 6.2 g/dL (ref 6.1–8.1)

## 2018-07-30 LAB — MICROALBUMIN / CREATININE URINE RATIO
Creatinine, Urine: 117 mg/dL (ref 20–275)
Microalb Creat Ratio: 3 mcg/mg creat (ref <30)
Microalb, Ur: 0.3 mg/dL

## 2018-07-30 LAB — VITAMIN D 25 HYDROXY (VIT D DEFICIENCY, FRACTURES): Vit D, 25-Hydroxy: 76 ng/mL (ref 30–100)

## 2018-07-30 LAB — HEMOGLOBIN A1C
Hgb A1c MFr Bld: 7 % of total Hgb — ABNORMAL HIGH (ref <5.7)
Mean Plasma Glucose: 154 (calc)
eAG (mmol/L): 8.5 (calc)

## 2018-07-30 LAB — CBC WITH DIFFERENTIAL/PLATELET
Absolute Monocytes: 274 cells/uL (ref 200–950)
Basophils Absolute: 21 cells/uL (ref 0–200)
Basophils Relative: 0.9 %
Eosinophils Absolute: 60 cells/uL (ref 15–500)
Eosinophils Relative: 2.6 %
HCT: 30.4 % — ABNORMAL LOW (ref 35.0–45.0)
Hemoglobin: 10.4 g/dL — ABNORMAL LOW (ref 11.7–15.5)
Lymphs Abs: 619 cells/uL — ABNORMAL LOW (ref 850–3900)
MCH: 36.2 pg — ABNORMAL HIGH (ref 27.0–33.0)
MCHC: 34.2 g/dL (ref 32.0–36.0)
MCV: 105.9 fL — ABNORMAL HIGH (ref 80.0–100.0)
MPV: 11.6 fL (ref 7.5–12.5)
Monocytes Relative: 11.9 %
Neutro Abs: 1327 cells/uL — ABNORMAL LOW (ref 1500–7800)
Neutrophils Relative %: 57.7 %
Platelets: 101 10*3/uL — ABNORMAL LOW (ref 140–400)
RBC: 2.87 10*6/uL — ABNORMAL LOW (ref 3.80–5.10)
RDW: 14 % (ref 11.0–15.0)
Total Lymphocyte: 26.9 %
WBC: 2.3 10*3/uL — ABNORMAL LOW (ref 3.8–10.8)

## 2018-07-30 LAB — LIPID PANEL
Cholesterol: 191 mg/dL (ref <200)
HDL: 64 mg/dL (ref >=50)
LDL Cholesterol (Calc): 109 mg/dL (calc) — ABNORMAL HIGH
Non-HDL Cholesterol (Calc): 127 mg/dL (calc) (ref <130)
Total CHOL/HDL Ratio: 3 (calc) (ref <5.0)
Triglycerides: 88 mg/dL (ref <150)

## 2018-07-30 LAB — URINALYSIS, ROUTINE W REFLEX MICROSCOPIC
Bilirubin Urine: NEGATIVE
Glucose, UA: NEGATIVE
Hgb urine dipstick: NEGATIVE
Leukocytes,Ua: NEGATIVE
Nitrite: NEGATIVE
Protein, ur: NEGATIVE
Specific Gravity, Urine: 1.023 (ref 1.001–1.03)
pH: 5.5 (ref 5.0–8.0)

## 2018-07-30 LAB — PROTIME-INR
INR: 1.2 — ABNORMAL HIGH
Prothrombin Time: 12 s — ABNORMAL HIGH (ref 9.0–11.5)

## 2018-07-30 LAB — AMMONIA: Ammonia: 152 umol/L — ABNORMAL HIGH (ref <=72)

## 2018-07-30 LAB — TSH: TSH: 0.82 mIU/L (ref 0.40–4.50)

## 2018-07-30 LAB — AFP TUMOR MARKER: AFP-Tumor Marker: 284.9 ng/mL — ABNORMAL HIGH

## 2018-07-30 LAB — INSULIN, RANDOM: Insulin: 8.5 u[IU]/mL

## 2018-07-30 LAB — VITAMIN B12: Vitamin B-12: 282 pg/mL (ref 200–1100)

## 2018-07-30 LAB — MAGNESIUM: Magnesium: 1.7 mg/dL (ref 1.5–2.5)

## 2018-07-31 ENCOUNTER — Other Ambulatory Visit: Payer: Self-pay | Admitting: Interventional Radiology

## 2018-07-31 DIAGNOSIS — C220 Liver cell carcinoma: Secondary | ICD-10-CM

## 2018-08-03 ENCOUNTER — Encounter: Payer: Self-pay | Admitting: *Deleted

## 2018-08-06 ENCOUNTER — Ambulatory Visit (HOSPITAL_COMMUNITY)
Admission: RE | Admit: 2018-08-06 | Discharge: 2018-08-06 | Disposition: A | Discharge status: Home / Self Care | Payer: PPO | Source: Ambulatory Visit | Attending: Interventional Radiology | Admitting: Interventional Radiology

## 2018-08-06 ENCOUNTER — Other Ambulatory Visit: Payer: Self-pay

## 2018-08-06 ENCOUNTER — Other Ambulatory Visit: Payer: Self-pay | Admitting: Interventional Radiology

## 2018-08-06 ENCOUNTER — Encounter (HOSPITAL_COMMUNITY): Payer: Self-pay

## 2018-08-06 DIAGNOSIS — C220 Liver cell carcinoma: Secondary | ICD-10-CM

## 2018-08-06 MED ORDER — SODIUM CHLORIDE (PF) 0.9 % IJ SOLN
INTRAMUSCULAR | Status: AC
  Filled 2018-08-06: qty 50

## 2018-08-06 MED ORDER — IOHEXOL 300 MG/ML  SOLN
100.0000 mL | Freq: Once | INTRAMUSCULAR | Status: AC | PRN
  Administered 2018-08-06: 12:00:00 100 mL via INTRAVENOUS

## 2018-08-12 ENCOUNTER — Ambulatory Visit
Admission: RE | Admit: 2018-08-12 | Discharge: 2018-08-12 | Disposition: A | Discharge status: Home / Self Care | Payer: PPO | Source: Ambulatory Visit | Attending: Interventional Radiology | Admitting: Interventional Radiology

## 2018-08-12 ENCOUNTER — Other Ambulatory Visit: Payer: Self-pay

## 2018-08-12 DIAGNOSIS — C220 Liver cell carcinoma: Secondary | ICD-10-CM

## 2018-08-14 ENCOUNTER — Other Ambulatory Visit: Payer: Self-pay

## 2018-08-14 ENCOUNTER — Ambulatory Visit
Admission: RE | Admit: 2018-08-14 | Discharge: 2018-08-14 | Disposition: A | Discharge status: Home / Self Care | Payer: PPO | Source: Ambulatory Visit | Attending: Interventional Radiology | Admitting: Interventional Radiology

## 2018-08-14 ENCOUNTER — Encounter: Payer: Self-pay | Admitting: *Deleted

## 2018-08-14 DIAGNOSIS — C220 Liver cell carcinoma: Secondary | ICD-10-CM | POA: Diagnosis not present

## 2018-08-14 HISTORY — PX: IR RADIOLOGIST EVAL & MGMT: IMG5224

## 2018-08-14 NOTE — Progress Notes (Signed)
Chief Complaint: [place], Cirrhosis   Referring Physician(s): Dr Marin Olp  History of Present Illness: Beverly Davis is a 78 y.o. female with history of segment 8 [place] with treatment with dual-therapy, with trans-arterial embolization performed 05/01/2016, then ablation 06/14/2016.  She then progressed/recurred in the right, and we again treated with CT ablation October of 2019  We are having a visit today with tele-medicine, because of the current COVID situation.   She tells me that she done just fine over time, and remains symptom free.  She has not had any hospitalizations.   Her AFP did increase, and is up from a low in September of ~19.6, now to ~285.    We have performed repeat CT imaging 08/06/2018.  This shows a nice result from the standpoint of the right liver tumor, with no evidence of residual disease.   She has a new lymph node adjacent to the liver which is pathologic appearing.  She also has a subcentimeter region in the left liver that is non-specific, and could be regenerative nodule.    We discussed the results of her imaging today, and she understands that she will be following up with Dr. Marin Olp, who agrees that she may need some systemic therapy.    Past Medical History:  Diagnosis Date  . Breast cancer ([place]) 05/2006   Right breast cancer  . Cirrhosis ([place])   . Diabetes mellitus without complication ([place])    type 2  . Fibroids    PT IS UNAWARE   . 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 Hypothyroidism   . Osteopenia   . Personal history of radiation therapy 2008  . Thrombocytopenia ([place])   . Thyroid disease     Past Surgical History:  Procedure Laterality Date  . ABDOMINAL HYSTERECTOMY     TAH BSO  . BREAST LUMPECTOMY Right 2008  . BREAST LUMPECTOMY WITH AXILLARY LYMPH NODE BIOPSY Right 06/25/2006  . CHOLECYSTECTOMY    . IR GENERIC HISTORICAL  03/28/2016   IR RADIOLOGIST EVAL & MGMT 03/28/2016 Corrie Mckusick, DO [facility]  . IR GENERIC  HISTORICAL  05/01/2016   IR ANGIOGRAM VISCERAL SELECTIVE 05/01/2016 Corrie Mckusick, DO [facility]  . IR GENERIC HISTORICAL  05/01/2016   IR ANGIOGRAM VISCERAL SELECTIVE 05/01/2016 Corrie Mckusick, DO [facility]  . IR GENERIC HISTORICAL  05/01/2016   IR ANGIOGRAM SELECTIVE EACH ADDITIONAL VESSEL 05/01/2016 Corrie Mckusick, DO [facility]  . IR GENERIC HISTORICAL  05/01/2016   IR ANGIOGRAM SELECTIVE EACH ADDITIONAL VESSEL 05/01/2016 Corrie Mckusick, DO [facility]  . IR GENERIC HISTORICAL  05/01/2016   IR ANGIOGRAM SELECTIVE EACH ADDITIONAL VESSEL 05/01/2016 Corrie Mckusick, DO [facility]  . IR GENERIC HISTORICAL  05/01/2016   IR EMBO TUMOR ORGAN ISCHEMIA INFARCT INC GUIDE ROADMAPPING 05/01/2016 Corrie Mckusick, DO [facility]  . IR GENERIC HISTORICAL  05/01/2016   IR CT SPINE LTD 05/01/2016 Corrie Mckusick, DO [facility]  . IR GENERIC HISTORICAL  05/01/2016   IR US GUIDE VASC ACCESS RIGHT 05/01/2016 Corrie Mckusick, DO [facility]  . IR GENERIC HISTORICAL  05/01/2016   IR ANGIOGRAM SELECTIVE EACH ADDITIONAL VESSEL 05/01/2016 Corrie Mckusick, DO [facility]  . IR RADIOLOGIST EVAL & MGMT  07/31/2016  . IR RADIOLOGIST EVAL & MGMT  12/25/2016  . IR RADIOLOGIST EVAL & MGMT  12/10/2017  . IR RADIOLOGIST EVAL & MGMT  04/21/2018  . RADIOFREQUENCY ABLATION N/A 06/14/2016   Procedure: LIVER MICROWAVE THERMAL  ABLATION;  Surgeon: Corrie Mckusick, DO;  Location:  [facility];  Service: Anesthesiology;  Laterality: N/A;  . RADIOLOGY WITH ANESTHESIA Right 01/21/2018   Procedure: CT WITH ANESTHESIA/MICROWAVE THERMAL ABLATION OF LIVER;  Surgeon: Corrie Mckusick, DO;  Location: [facility];  Service: Radiology;  Laterality: Right;  . TUBAL LIGATION  1978    Allergies: Metformin and related and Welchol [colesevelam hcl]  Medications: Prior to Admission medications   Medication Sig Start Date End Date Taking? Authorizing Provider  Cholecalciferol (VITAMIN D3) 5000 units CAPS Take 5,000 Units by mouth daily.    [provider]  glipiZIDE (GLUCOTROL) 5  MG tablet Take 1 tablet 3 x /day with meals for Diabetes 03/17/18   Unk Pinto, MD  lactulose (CHRONULAC) 10 GM/15ML solution Take 30 mLs (20 g total) by mouth 3 (three) times daily. Patient taking differently: Take 13.3 g by mouth daily.  04/03/16   Rolene Course, PA-C  levothyroxine (SYNTHROID) 112 MCG tablet Take 1 tablet daily on an empty stomach with only water for 30 minutes & no Antacid meds, Calcium or Magnesium for 4 hours & avoid Biotin 07/29/18   Unk Pinto, MD  magnesium oxide (MAG-OX) 400 MG tablet Take 400 mg by mouth 3 (three) times daily.     [provider]  metFORMIN (GLUCOPHAGE-XR) 500 MG 24 hr tablet Take 2 tablets  2 x /day with meals for Diabetes 06/10/18   Unk Pinto, MD  ONE TOUCH ULTRA TEST test strip CHECK BLOOD SUGAR ONE TIME DAILY 11/22/17   Liane Comber, NP  [facility] DELICA LANCETS 25E MISC USE TO CHECK BLOOD SUGAR ONE TIME DAILY 12/25/17   Unk Pinto, MD  sitaGLIPtin-metformin (JANUMET) 50-1000 MG tablet Take 1 tablet by mouth daily. Takes in the morning. Gets samples.    [provider]     Family History  Problem Relation Age of Onset  . Cancer Mother        Brain cancer  . Cancer Father        Prostate cancer  . Diabetes Sister   . Hypertension Sister   . Liver disease Brother   . Diabetes Maternal Aunt   . Heart Problems Maternal Grandfather   . Cancer Paternal Grandfather        Unknown cancer  . Diabetes Sister     Social History   Socioeconomic History  . Marital status: Married    Spouse name: Not on file  . Number of children: Not on file  . Years of education: Not on file  . Highest education level: Not on file  Occupational History  . Not on file  Social Needs  . Financial resource strain: Not on file  . Food insecurity:    Worry: Not on file    Inability: Not on file  . Transportation needs:    Medical: Not on file    Non-medical: Not on file  Tobacco Use  . Smoking status: Never Smoker   . Smokeless tobacco: Never Used  Substance and Sexual Activity  . Alcohol use: No  . Drug use: No  . Sexual activity: Yes    Birth control/protection: Post-menopausal, Surgical    Comment: Hysterectomy  Lifestyle  . Physical activity:    Days per week: Not on file    Minutes per session: Not on file  . Stress: Not on file  Relationships  . Social connections:    Talks on phone: Not on file    Gets together: Not on file    Attends religious service: Not on file  Active member of club or organization: Not on file    Attends meetings of clubs or organizations: Not on file    Relationship status: Not on file  Other Topics Concern  . Not on file  Social History Narrative  . Not on file    ECOG Status: 0 - Asymptomatic  Review of Systems  Review of Systems: A 12 point ROS discussed and pertinent positives are indicated in the HPI above.  All other systems are negative.  Physical Exam No direct physical exam was performed (except for noted visual exam findings with Video Visits).    Vital Signs: There were no vitals taken for this visit.  Imaging: Ct Abdomen Pelvis W Wo Contrast  Addendum Date: 08/11/2018   ADDENDUM REPORT: 08/11/2018 10:48 ADDENDUM: After conversation with Dr. Earleen Newport and reviewing the images again, the patient has a 2.5 x 2.4 cm heterogeneously enhancing lymph node in the hepatoduodenal ligament, new since 11/11/2017. Imaging features highly suspicious for metastatic disease. Electronically Signed   By: Misty Stanley M.D.   On: 08/11/2018 10:48   Result Date: 08/11/2018 CLINICAL DATA:  History of hepatocellular carcinoma. EXAM: CT ABDOMEN AND PELVIS WITHOUT AND WITH CONTRAST TECHNIQUE: Multidetector CT imaging of the abdomen and pelvis was performed following the standard protocol before and following the bolus administration of intravenous contrast. CONTRAST:  135m OMNIPAQUE IOHEXOL 300 MG/ML  SOLN COMPARISON:  11/11/2017 FINDINGS: Lower chest:  Unremarkable.  Paraesophageal varices again noted. Hepatobiliary: Nodular liver contour compatible with cirrhosis. Interval enlargement of the right hepatic dome ablation defect is consistent with the interval re-treatment by microwave ablation period on axial imaging today, the defect measures 4.3 x 3.8 cm compared to 3.1 x 2.5 cm previously. Defect is well defined. No suspicious enhancement associated with the treatment bed on today's exam. The inferior and inferolateral nodular foci of enhancement seen on the previous exam are no longer evident. No new worrisome hypervascular lesion in the liver parenchyma on arterial phase imaging. Today's exam does demonstrate a 6 mm hypoattenuating lesion in the lateral segment left liver (segment II) that was not visible on the previous study or on 06/11/2017. Gallbladder is surgically absent. No intrahepatic or extrahepatic biliary dilation. Pancreas: No focal mass lesion. No dilatation of the main duct. No intraparenchymal cyst. No peripancreatic edema. Spleen: No splenomegaly. No focal mass lesion. Adrenals/Urinary Tract: No adrenal nodule or mass. Right kidney unremarkable. 5 mm low-density lesion lower pole left kidney is stable. No evidence for hydroureter. Stable appearance of mild anterior bladder wall thickening. Stomach/Bowel: Stomach is unremarkable. No gastric wall thickening. No evidence of outlet obstruction. Duodenum is normally positioned as is the ligament of Treitz. No small bowel wall thickening. No small bowel dilatation. The terminal ileum is normal. The appendix is normal. Right colon is nondistended which likely accentuates the apparent wall thickening, but there is subtle associated edema along the ascending colon. Diverticular changes noted in the left colon without edema or inflammation. Vascular/Lymphatic: There is abdominal aortic atherosclerosis without aneurysm. Extensive venous collateralization again noted left abdomen. There is no  gastrohepatic or hepatoduodenal ligament lymphadenopathy. No intraperitoneal or retroperitoneal lymphadenopathy. No pelvic sidewall lymphadenopathy. Reproductive: The uterus is surgically absent. There is no adnexal mass. Other: No intraperitoneal free fluid. Musculoskeletal: No worrisome lytic or sclerotic osseous abnormality. IMPRESSION: 1. Interval enlargement of the ablation defect at the hepatic dome consistent with interval re-treatment. The nodular areas of enhancement seen along the inferior aspect of the defect on the previous study are no longer evident.  2. No new suspicious hypervascular abnormality in the liver on today's study. The patient does have a new 6 mm hypoattenuating focus in the lateral segment left liver and close attention on follow-up recommended. 3. Nodular hepatic contour compatible with cirrhosis. 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 4. Mild edema adjacent to the ascending colon with potential associated colonic wall thickening. Nonspecific finding, but component of right-sided infectious/inflammatory colitis cannot be excluded. 5.  Aortic Atherosclerois (ICD10-170.0) Electronically Signed: By: Misty Stanley M.D. On: 08/06/2018 17:11    Labs:  CBC: Recent Labs    12/17/17 1529 01/21/18 1100 04/08/18 1022 07/29/18 1116  WBC 2.4* 2.8* 2.4* 2.3*  HGB 10.6* 10.4* 10.7* 10.4*  HCT 30.9* 30.9* 31.4* 30.4*  PLT 81* 85* 95* 101*    COAGS: Recent Labs    01/21/18 1100 07/29/18 1116  INR 1.11 1.2*    BMP: Recent Labs    01/09/18 1205 01/21/18 1100 04/08/18 1022 07/29/18 1116  NA 139 138 136 138  K 4.4 4.3 4.2 4.2  CL 110 109 108 108  CO2 22 21* 20 21  GLUCOSE 286* 161* 146* 132*  BUN 17 15 14 13   CALCIUM 9.7 8.9 8.9 8.9  CREATININE 0.68 0.60 0.77 0.62  GFRNONAA >60 >60 75 88  GFRAA >60 >60 87 102    LIVER FUNCTION TESTS: Recent Labs    11/11/17 0922  12/03/17 1438  01/09/18 1205 01/21/18 1100 04/08/18 1022 07/29/18 1116  BILITOT 1.7*   < > 1.4 1.4* 1.4* 1.0 1.3*  AST 64*   < > 68* 58* 64* 46* 57*  ALT 48*   < > 49* 46* 47* 33* 38*  ALKPHOS 76  --  88* 80 79  --   --   PROT 7.0   < > 6.5 6.2* 6.5 6.1 6.2  ALBUMIN 3.6  --  3.1* 3.1* 3.6  --   --    < > = values in this interval not displayed.    TUMOR MARKERS: Recent Labs    07/29/18 1116  AFPTM 284.9*    Assessment and Plan:  Ms Rion is a 78 year old female with a history of cirrhosis and right liver HCC.    She is now [facility] multi-modality treatment of her Intermediate Stage segment 8 HCC, as well as re-treatment for progression/recurrence near the ablation margin.    Her last treatment was October 2019, and her follow up imaging 08/06/2018 by contrast CT shows complete response (CR) at the right liver.    She does, however, have a new pathologic node at the liver margin, in the deep drainage territory of the liver, which is suspicious for metastatic disease to the nodes.  This may be accounting for her increased AFP.    A second questionable CT finding in the left liver -- a subcentimeter hypodense lesion -- may simply be a regenerative nodule.  Surveillance is indicated for this small lesion.    I discussed the imaging findings today, and let her know that Dr. Marin Olp is aware of her status.  He will be kindly seeing her back to discuss options.    We also discussed the need for ongoing surveillance of her liver, given her known cirrhosis.  She is sensitive to financial toxicity of medical tests, which I understand.  I did reinforce that we will do our best to minimize the number of tests and scans, though surveillance is indicated by AASLD guidelines given her cirrhosis  and known [place], as well as the need to watch our treatment area, and the findings of presumed regenerative nodules.  In addition, she will likely need imaging in the future as she progresses with Oncologic treatment.   She  understands.   Plan: - Dr. Marin Olp will be kindly seeing her back to discuss options to treat what appears to be nodal progression of Early.   - We will need to continue surveillance of the right treatment area, as well as the non-specific findings of the left liver.  A 3-6 month follow up with [facility] is reasonable.  Probably on the longer side given her sensitivity to the financial toxicity of medical testing and appointments.  - Repeat CT abdomen/pelvis, without and with contrast, liver protocol, at the time of her next appointment, unless otherwise indicated.    Electronically Signed: Corrie Mckusick 08/14/2018, 12:21 PM   I spent a total of    25 Minutes in remote  clinical consultation, greater than 50% of which was counseling/coordinating care for [facility], cirrhosis, SP CT guided ablation of right liver HCC.    Visit type: Audio only (telephone). Audio (no video) only due to lack of video . Alternative for in-person consultation at [facility], [address] [place], [place], [place]. This visit type was conducted due to national recommendations for restrictions regarding the COVID-19 Pandemic (e.g. social distancing).  This format is felt to be most appropriate for this patient at this time.  All issues noted in this document were discussed and addressed.

## 2018-08-18 ENCOUNTER — Telehealth: Payer: Self-pay | Admitting: *Deleted

## 2018-08-18 NOTE — Telephone Encounter (Signed)
Returned call to pt son, unable to leave message.  Message when son calls: Pt received a call to come into office to see MD regarding recent CT scan results to go over. Appt for pt  08/27/18 at1045am. Pt to arrive at 1030am

## 2018-08-18 NOTE — Telephone Encounter (Signed)
Attempt to reach pt son, unable to leave vm,. Mailbox is full

## 2018-08-25 ENCOUNTER — Other Ambulatory Visit: Payer: Self-pay

## 2018-08-25 DIAGNOSIS — Z1211 Encounter for screening for malignant neoplasm of colon: Secondary | ICD-10-CM

## 2018-08-25 LAB — POC HEMOCCULT BLD/STL (HOME/3-CARD/SCREEN)
Card #2 Fecal Occult Blod, POC: NEGATIVE
Card #3 Fecal Occult Blood, POC: NEGATIVE
Fecal Occult Blood, POC: NEGATIVE

## 2018-08-26 DIAGNOSIS — Z1211 Encounter for screening for malignant neoplasm of colon: Secondary | ICD-10-CM | POA: Diagnosis not present

## 2018-08-27 ENCOUNTER — Other Ambulatory Visit: Payer: Self-pay

## 2018-08-27 ENCOUNTER — Inpatient Hospital Stay (HOSPITAL_BASED_OUTPATIENT_CLINIC_OR_DEPARTMENT_OTHER): Payer: PPO | Admitting: Hematology & Oncology

## 2018-08-27 ENCOUNTER — Inpatient Hospital Stay: Payer: PPO | Attending: Hematology & Oncology

## 2018-08-27 ENCOUNTER — Encounter: Payer: Self-pay | Admitting: Hematology & Oncology

## 2018-08-27 VITALS — BP 117/54 | HR 76 | Temp 98.0°F | Resp 19 | Wt 144.0 lb

## 2018-08-27 DIAGNOSIS — C220 Liver cell carcinoma: Secondary | ICD-10-CM

## 2018-08-27 DIAGNOSIS — Z853 Personal history of malignant neoplasm of breast: Secondary | ICD-10-CM | POA: Diagnosis not present

## 2018-08-27 DIAGNOSIS — F0390 Unspecified dementia without behavioral disturbance: Secondary | ICD-10-CM | POA: Diagnosis not present

## 2018-08-27 DIAGNOSIS — K7581 Nonalcoholic steatohepatitis (NASH): Secondary | ICD-10-CM | POA: Insufficient documentation

## 2018-08-27 DIAGNOSIS — Z79899 Other long term (current) drug therapy: Secondary | ICD-10-CM

## 2018-08-27 DIAGNOSIS — R413 Other amnesia: Secondary | ICD-10-CM

## 2018-08-27 DIAGNOSIS — R531 Weakness: Secondary | ICD-10-CM | POA: Diagnosis not present

## 2018-08-27 DIAGNOSIS — K7460 Unspecified cirrhosis of liver: Secondary | ICD-10-CM | POA: Insufficient documentation

## 2018-08-27 LAB — CBC WITH DIFFERENTIAL (CANCER CENTER ONLY)
Abs Immature Granulocytes: 0 10*3/uL (ref 0.00–0.07)
Basophils Absolute: 0 10*3/uL (ref 0.0–0.1)
Basophils Relative: 1 %
Eosinophils Absolute: 0 10*3/uL (ref 0.0–0.5)
Eosinophils Relative: 2 %
HCT: 31 % — ABNORMAL LOW (ref 36.0–46.0)
Hemoglobin: 10.2 g/dL — ABNORMAL LOW (ref 12.0–15.0)
Immature Granulocytes: 0 %
Lymphocytes Relative: 20 %
Lymphs Abs: 0.4 10*3/uL — ABNORMAL LOW (ref 0.7–4.0)
MCH: 35.7 pg — ABNORMAL HIGH (ref 26.0–34.0)
MCHC: 32.9 g/dL (ref 30.0–36.0)
MCV: 108.4 fL — ABNORMAL HIGH (ref 80.0–100.0)
Monocytes Absolute: 0.3 10*3/uL (ref 0.1–1.0)
Monocytes Relative: 12 %
Neutro Abs: 1.4 10*3/uL — ABNORMAL LOW (ref 1.7–7.7)
Neutrophils Relative %: 65 %
Platelet Count: 95 10*3/uL — ABNORMAL LOW (ref 150–400)
RBC: 2.86 MIL/uL — ABNORMAL LOW (ref 3.87–5.11)
RDW: 14.4 % (ref 11.5–15.5)
WBC Count: 2.1 10*3/uL — ABNORMAL LOW (ref 4.0–10.5)
nRBC: 0 % (ref 0.0–0.2)

## 2018-08-27 LAB — CMP (CANCER CENTER ONLY)
ALT: 47 U/L — ABNORMAL HIGH (ref 0–44)
AST: 69 U/L — ABNORMAL HIGH (ref 15–41)
Albumin: 3.5 g/dL (ref 3.5–5.0)
Alkaline Phosphatase: 124 U/L (ref 38–126)
Anion gap: 8 (ref 5–15)
BUN: 11 mg/dL (ref 8–23)
CO2: 25 mmol/L (ref 22–32)
Calcium: 9.1 mg/dL (ref 8.9–10.3)
Chloride: 107 mmol/L (ref 98–111)
Creatinine: 0.7 mg/dL (ref 0.44–1.00)
GFR, Est AFR Am: >60 mL/min (ref >60)
GFR, Estimated: >60 mL/min (ref >60)
Glucose, Bld: 275 mg/dL — ABNORMAL HIGH (ref 70–99)
Potassium: 4 mmol/L (ref 3.5–5.1)
Sodium: 140 mmol/L (ref 135–145)
Total Bilirubin: 1.5 mg/dL — ABNORMAL HIGH (ref 0.3–1.2)
Total Protein: 6.4 g/dL — ABNORMAL LOW (ref 6.5–8.1)

## 2018-08-27 LAB — LACTATE DEHYDROGENASE: LDH: 258 U/L — ABNORMAL HIGH (ref 98–192)

## 2018-08-27 LAB — AMMONIA: Ammonia: 136 umol/L — ABNORMAL HIGH (ref 9–35)

## 2018-08-27 NOTE — Progress Notes (Signed)
Hematology and Oncology Follow Up Visit  RAKEB KIBBLE [id number] 1940-05-10 78 y.o. 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 Swanner is here today for follow-up.  Unfortunately, I think we clearly have progressive disease.  She had been followed closely by radiology.  They had a alpha-fetoprotein done on her.  This unfortunately was up to 285.  She then had a CT scan done.  This was done on 08/06/2018.  This did show some extrahepatic disease with a 2.5 x 2.4 cm lymph node in the hepatoduodenal ligament.  She has had quite a bit of intrahepatic therapy already.  I told her that because of the cancer outside of the liver, that any treatment would have to be systemic.  She feels okay.  She really has no specific complaints.  She has no abdominal pain.  There is no nausea or vomiting.  She has had no bleeding.  Her liver function is still doing quite well.  Her albumin I think is 3.5.  Her bilirubin is 1.5.  She is had no problems with bowels or bladder.  I do think that she probably needs to have an endoscopy so that we can see if she has any kind of varices.  I did talk to her about systemic therapy.  I think that she would probably be a good candidate for immunotherapy along with Avastin.  This does seem to be the preferred standard of care.  I think she has decent liver function to be able to handle this.  Again, need to make sure she does not have any varices that could be a problem.  Overall, I would say her performance status is ECOG 1.    Medications:  Allergies as of 08/27/2018      Reactions   Metformin And Related Other (See Comments)   Unknown   Welchol [colesevelam Hcl] Other (See Comments)   Unknown      Medication List       Accurate as of August 27, 2018 11:42 AM. If you have any questions, ask your nurse or doctor.        STOP taking these medications   ONE TOUCH ULTRA TEST test strip Generic drug:  glucose blood Stopped by:  [name], MD   OneTouch Delica Lancets 05L Misc Stopped by:  [name], MD   sitaGLIPtin-metformin 50-1000 MG tablet Commonly known as:  JANUMET Stopped by:  [name], MD     TAKE these medications   cyanocobalamin 1000 MCG tablet Take 1,000 mcg by mouth daily.   glipiZIDE 5 MG tablet Commonly known as:  GLUCOTROL Take 1 tablet 3 x /day with meals for Diabetes   Januvia 100 MG tablet Generic drug:  sitaGLIPtin Take 100 mg by mouth daily.   lactulose 10 GM/15ML solution Commonly known as:  CHRONULAC Take 30 mLs (20 g total) by mouth 3 (three) times daily. What changed:    how much to take  when to take this   levothyroxine 112 MCG tablet Commonly known as:  Synthroid Take 1 tablet daily on an empty stomach with only water for 30 minutes & no Antacid meds, Calcium or Magnesium for 4 hours & avoid Biotin   magnesium oxide 400 MG  tablet Commonly known as:  MAG-OX Take 400 mg by mouth 3 (three) times daily.   metFORMIN 500 MG 24 hr tablet Commonly known as:  GLUCOPHAGE-XR Take 2 tablets  2 x /day with meals for Diabetes   Vitamin D3 125 MCG (5000 UT) Caps Take 5,000 Units by mouth daily.       Allergies:  Allergies  Allergen Reactions  . Metformin And Related Other (See Comments)    Unknown  . Welchol [Colesevelam Hcl] Other (See Comments)    Unknown    Past Medical History, Surgical history, Social history, and Family History were reviewed and updated.  Review of Systems: Review of Systems  Constitutional: Negative.   HENT: Negative.   Eyes: Negative.   Respiratory: Negative.   Cardiovascular: Negative.   Gastrointestinal: Negative.   Genitourinary: Negative.   Musculoskeletal:  Negative.   Skin: Negative.   Neurological: Negative.   Endo/Heme/Allergies: Negative.   Psychiatric/Behavioral: Positive for memory loss.      Physical Exam:  weight is 144 lb (65.3 kg). Her oral temperature is 98 F (36.7 C). Her blood pressure is 117/54 (abnormal) and her pulse is 76. Her respiration is 19 and oxygen saturation is 100%.   Wt Readings from Last 3 Encounters:  08/27/18 144 lb (65.3 kg)  07/29/18 144 lb (65.3 kg)  04/08/18 145 lb 3.2 oz (65.9 kg)    Physical Exam Vitals signs reviewed.  HENT:     Head: Normocephalic and atraumatic.  Eyes:     Pupils: Pupils are equal, round, and reactive to light.  Neck:     Musculoskeletal: Normal range of motion.  Cardiovascular:     Rate and Rhythm: Normal rate and regular rhythm.     Heart sounds: Normal heart sounds.  Pulmonary:     Effort: Pulmonary effort is normal.     Breath sounds: Normal breath sounds.  Abdominal:     General: Bowel sounds are normal.     Palpations: Abdomen is soft.  Musculoskeletal: Normal range of motion.        General: No tenderness or deformity.  Lymphadenopathy:     Cervical: No cervical adenopathy.  Skin:    General: Skin is warm and dry.     Findings: No erythema or rash.  Neurological:     Mental Status: She is alert and oriented to person, place, and time.  Psychiatric:        Behavior: Behavior normal.        Thought Content: Thought content normal.        Judgment: Judgment normal.      Lab Results  Component Value Date   WBC 2.1 (L) 08/27/2018   HGB 10.2 (L) 08/27/2018   HCT 31.0 (L) 08/27/2018   MCV 108.4 (H) 08/27/2018   PLT 95 (L) 08/27/2018   Lab Results  Component Value Date   FERRITIN 119 03/23/2016   IRON 36 03/23/2016   TIBC 234 (L) 03/23/2016   UIBC 198 03/23/2016   IRONPCTSAT 15 03/23/2016   Lab Results  Component Value Date   RETICCTPCT 1.2 09/05/2015   RBC 2.86 (L) 08/27/2018   RETICCTABS 40,080 09/05/2015   No results found for:  KPAFRELGTCHN, LAMBDASER, KAPLAMBRATIO No results found for: Kandis Cocking, IGMSERUM Lab Results  Component Value Date   TOTALPROTELP 7.1 02/03/2008   ALBUMINELP 54.8 (L) 02/03/2008   A1GS 4.2 02/03/2008   A2GS 9.5 02/03/2008   BETS 8.1 (H) 02/03/2008   BETA2SER 5.8 02/03/2008   GAMS 17.6 02/03/2008  MSPIKE NOT DET 02/03/2008   SPEI * 02/03/2008     Chemistry      Component Value Date/Time   NA 140 08/27/2018 1043   NA 139 01/01/2017 1034   K 4.0 08/27/2018 1043   K 4.1 01/01/2017 1034   CL 107 08/27/2018 1043   CL 107 01/01/2017 1034   CO2 25 08/27/2018 1043   CO2 29 01/01/2017 1034   BUN 11 08/27/2018 1043   BUN 9 01/01/2017 1034   CREATININE 0.70 08/27/2018 1043   CREATININE 0.62 07/29/2018 1116      Component Value Date/Time   CALCIUM 9.1 08/27/2018 1043   CALCIUM 9.4 01/01/2017 1034   ALKPHOS 124 08/27/2018 1043   ALKPHOS 81 01/01/2017 1034   AST 69 (H) 08/27/2018 1043   ALT 47 (H) 08/27/2018 1043   ALT 48 (H) 01/01/2017 1034   BILITOT 1.5 (H) 08/27/2018 1043      Impression and Plan: Ms. Brickman is a very pleasant 78 yo caucasian female with history of hepatocellular carcinoma. She had RFA in March, 2018 and did well.    I spent about 40 minutes with her today.  I showed her the CT scan.  I explained to her that since there is cancer outside of the liver, that we really cannot do any type of intrahepatic therapy.  Again I still think that she would be able to tolerate systemic therapy.  Again we will speak to her gastroenterologist to see about doing a endoscopy on her.  Thankfully, we really do not have to get started too quickly with treatment.  This is more complicated than I had initially thought given that she now has extrahepatic disease.  I had to spend extra time with her.  She does have some memory issues.  I will have to speak to her son on the phone.  I will get her back before we do any kind of treatment just to go over our plans.      [name], MD 6/4/202011:42 AM

## 2018-08-28 ENCOUNTER — Telehealth: Payer: Self-pay | Admitting: Hematology & Oncology

## 2018-08-28 LAB — AFP TUMOR MARKER: AFP, Serum, Tumor Marker: 286 ng/mL — ABNORMAL HIGH (ref 0.0–8.3)

## 2018-08-28 NOTE — Telephone Encounter (Signed)
lvm to inform pt of 6/24 appt at 130pm

## 2018-09-10 ENCOUNTER — Other Ambulatory Visit: Payer: Self-pay | Admitting: Internal Medicine

## 2018-09-10 MED ORDER — METFORMIN HCL 500 MG PO TABS: ORAL_TABLET | ORAL | 3 refills | Status: DC

## 2018-09-14 ENCOUNTER — Telehealth: Payer: Self-pay | Admitting: *Deleted

## 2018-09-14 NOTE — Telephone Encounter (Signed)
Call received from patient stating that she has an appt with Dr. Marin Olp on 09/16/18 and thought that she was suppose to have an endoscopy prior to that appt.  Dr. Marin Olp notified.  Call placed to Dr. Benson Norway and Dr. Marin Olp spoke with Dr. Benson Norway regarding pt.'s endo.  Per Dr. Benson Norway, his office will contact pt regarding endo.

## 2018-09-14 NOTE — Telephone Encounter (Signed)
Call received from patient stating that she received a call from Dr. Ulyses Amor office and that endoscopy will be done on 09/15/18.  Dr. Marin Olp notified and he would like for pt to keep scheduled appt on 09/16/18.  Patient notified and will be here at [facility] for scheduled appt on 09/16/18.

## 2018-09-15 DIAGNOSIS — F459 Somatoform disorder, unspecified: Secondary | ICD-10-CM | POA: Diagnosis not present

## 2018-09-15 DIAGNOSIS — K7460 Unspecified cirrhosis of liver: Secondary | ICD-10-CM | POA: Diagnosis not present

## 2018-09-15 DIAGNOSIS — R933 Abnormal findings on diagnostic imaging of other parts of digestive tract: Secondary | ICD-10-CM | POA: Diagnosis not present

## 2018-09-15 DIAGNOSIS — K766 Portal hypertension: Secondary | ICD-10-CM | POA: Diagnosis not present

## 2018-09-15 DIAGNOSIS — I8500 Esophageal varices without bleeding: Secondary | ICD-10-CM | POA: Diagnosis not present

## 2018-09-16 ENCOUNTER — Inpatient Hospital Stay (HOSPITAL_BASED_OUTPATIENT_CLINIC_OR_DEPARTMENT_OTHER): Payer: PPO | Admitting: Hematology & Oncology

## 2018-09-16 ENCOUNTER — Inpatient Hospital Stay: Payer: PPO

## 2018-09-16 ENCOUNTER — Encounter: Payer: Self-pay | Admitting: Hematology & Oncology

## 2018-09-16 ENCOUNTER — Other Ambulatory Visit: Payer: Self-pay

## 2018-09-16 ENCOUNTER — Telehealth: Payer: Self-pay | Admitting: *Deleted

## 2018-09-16 VITALS — BP 104/71 | HR 75 | Temp 98.2°F | Resp 16 | Wt 144.0 lb

## 2018-09-16 DIAGNOSIS — C220 Liver cell carcinoma: Secondary | ICD-10-CM | POA: Diagnosis not present

## 2018-09-16 DIAGNOSIS — Z7189 Other specified counseling: Secondary | ICD-10-CM

## 2018-09-16 DIAGNOSIS — R531 Weakness: Secondary | ICD-10-CM

## 2018-09-16 DIAGNOSIS — F0390 Unspecified dementia without behavioral disturbance: Secondary | ICD-10-CM

## 2018-09-16 DIAGNOSIS — K760 Fatty (change of) liver, not elsewhere classified: Secondary | ICD-10-CM

## 2018-09-16 DIAGNOSIS — K7581 Nonalcoholic steatohepatitis (NASH): Secondary | ICD-10-CM | POA: Diagnosis not present

## 2018-09-16 DIAGNOSIS — Z79899 Other long term (current) drug therapy: Secondary | ICD-10-CM

## 2018-09-16 DIAGNOSIS — Z853 Personal history of malignant neoplasm of breast: Secondary | ICD-10-CM | POA: Diagnosis not present

## 2018-09-16 LAB — CBC WITH DIFFERENTIAL (CANCER CENTER ONLY)
Abs Immature Granulocytes: 0.01 10*3/uL (ref 0.00–0.07)
Basophils Absolute: 0 10*3/uL (ref 0.0–0.1)
Basophils Relative: 1 %
Eosinophils Absolute: 0.1 10*3/uL (ref 0.0–0.5)
Eosinophils Relative: 3 %
HCT: 32 % — ABNORMAL LOW (ref 36.0–46.0)
Hemoglobin: 10.9 g/dL — ABNORMAL LOW (ref 12.0–15.0)
Immature Granulocytes: 0 %
Lymphocytes Relative: 20 %
Lymphs Abs: 0.5 10*3/uL — ABNORMAL LOW (ref 0.7–4.0)
MCH: 36.5 pg — ABNORMAL HIGH (ref 26.0–34.0)
MCHC: 34.1 g/dL (ref 30.0–36.0)
MCV: 107 fL — ABNORMAL HIGH (ref 80.0–100.0)
Monocytes Absolute: 0.3 10*3/uL (ref 0.1–1.0)
Monocytes Relative: 12 %
Neutro Abs: 1.5 10*3/uL — ABNORMAL LOW (ref 1.7–7.7)
Neutrophils Relative %: 64 %
Platelet Count: 103 10*3/uL — ABNORMAL LOW (ref 150–400)
RBC: 2.99 MIL/uL — ABNORMAL LOW (ref 3.87–5.11)
RDW: 14.4 % (ref 11.5–15.5)
WBC Count: 2.3 10*3/uL — ABNORMAL LOW (ref 4.0–10.5)
nRBC: 0 % (ref 0.0–0.2)

## 2018-09-16 LAB — CMP (CANCER CENTER ONLY)
ALT: 49 U/L — ABNORMAL HIGH (ref 0–44)
AST: 68 U/L — ABNORMAL HIGH (ref 15–41)
Albumin: 3.6 g/dL (ref 3.5–5.0)
Alkaline Phosphatase: 135 U/L — ABNORMAL HIGH (ref 38–126)
Anion gap: 9 (ref 5–15)
BUN: 9 mg/dL (ref 8–23)
CO2: 24 mmol/L (ref 22–32)
Calcium: 9.8 mg/dL (ref 8.9–10.3)
Chloride: 106 mmol/L (ref 98–111)
Creatinine: 0.66 mg/dL (ref 0.44–1.00)
GFR, Est AFR Am: >60 mL/min (ref >60)
GFR, Estimated: >60 mL/min (ref >60)
Glucose, Bld: 228 mg/dL — ABNORMAL HIGH (ref 70–99)
Potassium: 4.2 mmol/L (ref 3.5–5.1)
Sodium: 139 mmol/L (ref 135–145)
Total Bilirubin: 1.4 mg/dL — ABNORMAL HIGH (ref 0.3–1.2)
Total Protein: 6.2 g/dL — ABNORMAL LOW (ref 6.5–8.1)

## 2018-09-16 NOTE — Progress Notes (Signed)
Hematology and Oncology Follow Up Visit  Beverly Davis [id number] Nov 11, 1940 78 y.o. 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  She probably had her upper endoscopy done yesterday.  This was done by Dr. Benson Norway.  Thankfully, there were no large varices.  Everything looked pretty good.  Her last alpha-fetoprotein was 360.  She feels okay.  She really has no specific complaints.  I did speak to her son on the phone after our meeting.  He did mention that she did feel weak in her legs on occasion.  I talked to her about the treatment options.  I really think that she has a good enough performance status and that her liver tests are okay as she could handle immunotherapy along with Avastin.  I think this would be the standard frontline therapy now for metastatic hepatocellular carcinoma.  She will clearly need to have a Port-A-Cath placed.  She just does not have good IV access peripherally.  I just do not want her been used as a pin cushion.  Her appetite is doing okay.  She has had no nausea or vomiting.  There is been no fever.  She has had no cough.  She has had no change in bowel or bladder habits.  Overall, despite her slight dementia, her performance status is probably ECOG 1.   Medications:  Allergies as of 09/16/2018      Reactions   Metformin And Related Other (See Comments)   Unknown   Welchol [colesevelam Hcl] Other (See Comments)   Unknown      Medication List       Accurate as of September 16, 2018  3:14 PM. If you have any questions, ask your nurse or doctor.        cyanocobalamin 1000 MCG tablet Take 1,000  mcg by mouth daily.   glipiZIDE 5 MG tablet Commonly known as: GLUCOTROL Take 1 tablet 3 x /day with meals for Diabetes   Januvia 100 MG tablet Generic drug: sitaGLIPtin Take 100 mg by mouth daily.   lactulose 10 GM/15ML solution Commonly known as: CHRONULAC Take 30 mLs (20 g total) by mouth 3 (three) times daily. What changed:   how much to take  when to take this  additional instructions   levothyroxine 112 MCG tablet Commonly known as: Synthroid Take 1 tablet daily on an empty stomach with only water for 30 minutes & no Antacid meds, Calcium or Magnesium for 4 hours & avoid Biotin   magnesium oxide 400 MG tablet Commonly known as: MAG-OX Take 400 mg by mouth daily.   metFORMIN 500 MG tablet Commonly known as: Glucophage Take 1 tablet with Breakfast & Lunch and 2 tablets with supper for Diabetes   OneTouch Ultra test strip Generic drug: glucose blood USE 1 STRIP TO CHECK GLUCOSE ONCE DAILY   Vitamin D3 125 MCG (5000 UT) Caps Take 5,000 Units by mouth daily.       Allergies:  Allergies  Allergen Reactions  . Metformin And Related Other (See Comments)    Unknown  . Welchol [Colesevelam Hcl] Other (See Comments)    Unknown    Past Medical History, Surgical history, Social  history, and Family History were reviewed and updated.  Review of Systems: Review of Systems  Constitutional: Negative.   HENT: Negative.   Eyes: Negative.   Respiratory: Negative.   Cardiovascular: Negative.   Gastrointestinal: Negative.   Genitourinary: Negative.   Musculoskeletal: Negative.   Skin: Negative.   Neurological: Negative.   Endo/Heme/Allergies: Negative.   Psychiatric/Behavioral: Positive for memory loss.      Physical Exam:  weight is 144 lb (65.3 kg). Her oral temperature is 98.2 F (36.8 C). Her blood pressure is 104/71 and her pulse is 75. Her respiration is 16 and oxygen saturation is 99%.   Wt Readings from Last 3 Encounters:  09/16/18 144 lb (65.3 kg)   08/27/18 144 lb (65.3 kg)  07/29/18 144 lb (65.3 kg)    Physical Exam Vitals signs reviewed.  HENT:     Head: Normocephalic and atraumatic.  Eyes:     Pupils: Pupils are equal, round, and reactive to light.  Neck:     Musculoskeletal: Normal range of motion.  Cardiovascular:     Rate and Rhythm: Normal rate and regular rhythm.     Heart sounds: Normal heart sounds.  Pulmonary:     Effort: Pulmonary effort is normal.     Breath sounds: Normal breath sounds.  Abdominal:     General: Bowel sounds are normal.     Palpations: Abdomen is soft.  Musculoskeletal: Normal range of motion.        General: No tenderness or deformity.  Lymphadenopathy:     Cervical: No cervical adenopathy.  Skin:    General: Skin is warm and dry.     Findings: No erythema or rash.  Neurological:     Mental Status: She is alert and oriented to person, place, and time.  Psychiatric:        Behavior: Behavior normal.        Thought Content: Thought content normal.        Judgment: Judgment normal.      Lab Results  Component Value Date   WBC 2.3 (L) 09/16/2018   HGB 10.9 (L) 09/16/2018   HCT 32.0 (L) 09/16/2018   MCV 107.0 (H) 09/16/2018   PLT 103 (L) 09/16/2018   Lab Results  Component Value Date   FERRITIN 119 03/23/2016   IRON 36 03/23/2016   TIBC 234 (L) 03/23/2016   UIBC 198 03/23/2016   IRONPCTSAT 15 03/23/2016   Lab Results  Component Value Date   RETICCTPCT 1.2 09/05/2015   RBC 2.99 (L) 09/16/2018   RETICCTABS 40,080 09/05/2015   No results found for: KPAFRELGTCHN, LAMBDASER, KAPLAMBRATIO No results found for: Kandis Cocking [facility] Lab Results  Component Value Date   TOTALPROTELP 7.1 02/03/2008   ALBUMINELP 54.8 (L) 02/03/2008   A1GS 4.2 02/03/2008   A2GS 9.5 02/03/2008   BETS 8.1 (H) 02/03/2008   BETA2SER 5.8 02/03/2008   GAMS 17.6 02/03/2008   MSPIKE NOT DET 02/03/2008   SPEI * 02/03/2008     Chemistry      Component Value Date/Time   NA 139 09/16/2018 1325    NA 139 01/01/2017 1034   K 4.2 09/16/2018 1325   K 4.1 01/01/2017 1034   CL 106 09/16/2018 1325   CL 107 01/01/2017 1034   CO2 24 09/16/2018 1325   CO2 29 01/01/2017 1034   BUN 9 09/16/2018 1325   BUN 9 01/01/2017 1034   CREATININE 0.66 09/16/2018 1325   CREATININE 0.62 07/29/2018 1116      Component  Value Date/Time   CALCIUM 9.8 09/16/2018 1325   CALCIUM 9.4 01/01/2017 1034   ALKPHOS 135 (H) 09/16/2018 1325   ALKPHOS 81 01/01/2017 1034   AST 68 (H) 09/16/2018 1325   ALT 49 (H) 09/16/2018 1325   ALT 48 (H) 01/01/2017 1034   BILITOT 1.4 (H) 09/16/2018 1325      Impression and Plan: Beverly Davis is a very pleasant 78 yo caucasian female with history of hepatocellular carcinoma. She had RFA in March, 2018 and did well.    I spent about 45 minutes with her today.  I wrote down the 2 treatment options that we had for her.  In the end, I believe that the IV therapy would be the best option as this probably would be more effective.  I did speak to her son on the phone after our meeting.  I updated him.  I told him that I gave his mom information sheets about the chemotherapy options.  I also wrote down on a piece of paper what the options were and how we did this.  I told him that the IV therapy would be once every 3 weeks in her office.  I would treat her with 4 cycles of treatment and then we would repeat her scans and see how everything looks.  Again, this was a little complicated given that she has some element of dementia which is mild.  I did have to repeat myself several times and just keep going over what was the most effective recommendation.  We will get started after the July 4 holiday.  She will have her Port-A-Cath placed and then we will get started with treatment.Marland Kitchen      [name], MD 6/24/20203:14 PM

## 2018-09-16 NOTE — Telephone Encounter (Signed)
Message received from patient's son stating that he missed a phone call from Dr. Marin Olp.  Dr. Marin Olp notified.

## 2018-09-17 ENCOUNTER — Encounter: Payer: Self-pay | Admitting: Hematology & Oncology

## 2018-09-17 DIAGNOSIS — Z7189 Other specified counseling: Secondary | ICD-10-CM | POA: Insufficient documentation

## 2018-09-17 HISTORY — DX: Other specified counseling: Z71.89

## 2018-09-17 LAB — LACTATE DEHYDROGENASE: LDH: 294 U/L — ABNORMAL HIGH (ref 98–192)

## 2018-09-17 LAB — AFP TUMOR MARKER: AFP, Serum, Tumor Marker: 360 ng/mL — ABNORMAL HIGH (ref 0.0–8.3)

## 2018-09-17 NOTE — Progress Notes (Signed)
START OFF PATHWAY REGIMEN - Other   [id number]:Atezolizumab 1,200 mg IV D1 + Bevacizumab 15 mg/kg IV D1 q21 Days:   A cycle is every 21 Days:     Atezolizumab      Bevacizumab-xxxx   **Always confirm dose/schedule in your pharmacy ordering system**  Patient Characteristics: Intent of Therapy: Non-Curative / Palliative Intent, Discussed with Patient

## 2018-09-18 ENCOUNTER — Telehealth: Payer: Self-pay | Admitting: Hematology & Oncology

## 2018-09-18 NOTE — Telephone Encounter (Signed)
Called patient and left a detailed VM regarding appointments that have been added for NEW START per 6/24 los

## 2018-09-24 ENCOUNTER — Other Ambulatory Visit: Payer: Self-pay | Admitting: Student

## 2018-09-28 ENCOUNTER — Other Ambulatory Visit: Payer: Self-pay

## 2018-09-28 ENCOUNTER — Ambulatory Visit (HOSPITAL_COMMUNITY)
Admission: RE | Admit: 2018-09-28 | Discharge: 2018-09-28 | Disposition: A | Discharge status: Home / Self Care | Payer: PPO | Source: Ambulatory Visit | Attending: Hematology & Oncology | Admitting: Hematology & Oncology

## 2018-09-28 ENCOUNTER — Encounter (HOSPITAL_COMMUNITY): Payer: Self-pay

## 2018-09-28 ENCOUNTER — Other Ambulatory Visit: Payer: Self-pay | Admitting: Hematology & Oncology

## 2018-09-28 DIAGNOSIS — E039 Hypothyroidism, unspecified: Secondary | ICD-10-CM | POA: Insufficient documentation

## 2018-09-28 DIAGNOSIS — F0390 Unspecified dementia without behavioral disturbance: Secondary | ICD-10-CM | POA: Diagnosis not present

## 2018-09-28 DIAGNOSIS — M8580 Other specified disorders of bone density and structure, unspecified site: Secondary | ICD-10-CM | POA: Diagnosis not present

## 2018-09-28 DIAGNOSIS — Z853 Personal history of malignant neoplasm of breast: Secondary | ICD-10-CM | POA: Insufficient documentation

## 2018-09-28 DIAGNOSIS — K7460 Unspecified cirrhosis of liver: Secondary | ICD-10-CM | POA: Diagnosis not present

## 2018-09-28 DIAGNOSIS — C220 Liver cell carcinoma: Secondary | ICD-10-CM

## 2018-09-28 DIAGNOSIS — Z79899 Other long term (current) drug therapy: Secondary | ICD-10-CM | POA: Insufficient documentation

## 2018-09-28 DIAGNOSIS — Z90710 Acquired absence of both cervix and uterus: Secondary | ICD-10-CM | POA: Diagnosis not present

## 2018-09-28 DIAGNOSIS — E119 Type 2 diabetes mellitus without complications: Secondary | ICD-10-CM | POA: Insufficient documentation

## 2018-09-28 DIAGNOSIS — Z888 Allergy status to other drugs, medicaments and biological substances status: Secondary | ICD-10-CM | POA: Diagnosis not present

## 2018-09-28 DIAGNOSIS — C779 Secondary and unspecified malignant neoplasm of lymph node, unspecified: Secondary | ICD-10-CM | POA: Diagnosis not present

## 2018-09-28 DIAGNOSIS — Z79890 Hormone replacement therapy (postmenopausal): Secondary | ICD-10-CM | POA: Insufficient documentation

## 2018-09-28 DIAGNOSIS — Z7984 Long term (current) use of oral hypoglycemic drugs: Secondary | ICD-10-CM | POA: Insufficient documentation

## 2018-09-28 DIAGNOSIS — Z452 Encounter for adjustment and management of vascular access device: Secondary | ICD-10-CM | POA: Diagnosis not present

## 2018-09-28 DIAGNOSIS — C787 Secondary malignant neoplasm of liver and intrahepatic bile duct: Secondary | ICD-10-CM | POA: Diagnosis not present

## 2018-09-28 DIAGNOSIS — Z90722 Acquired absence of ovaries, bilateral: Secondary | ICD-10-CM | POA: Diagnosis not present

## 2018-09-28 HISTORY — DX: Unspecified dementia, unspecified severity, without behavioral disturbance, psychotic disturbance, mood disturbance, and anxiety: F03.90

## 2018-09-28 HISTORY — PX: IR IMAGING GUIDED PORT INSERTION: IMG5740

## 2018-09-28 LAB — CBC
HCT: 31.9 % — ABNORMAL LOW (ref 36.0–46.0)
Hemoglobin: 10.5 g/dL — ABNORMAL LOW (ref 12.0–15.0)
MCH: 36 pg — ABNORMAL HIGH (ref 26.0–34.0)
MCHC: 32.9 g/dL (ref 30.0–36.0)
MCV: 109.2 fL — ABNORMAL HIGH (ref 80.0–100.0)
Platelets: 95 10*3/uL — ABNORMAL LOW (ref 150–400)
RBC: 2.92 MIL/uL — ABNORMAL LOW (ref 3.87–5.11)
RDW: 14.6 % (ref 11.5–15.5)
WBC: 2.6 10*3/uL — ABNORMAL LOW (ref 4.0–10.5)
nRBC: 0 % (ref 0.0–0.2)

## 2018-09-28 LAB — GLUCOSE, CAPILLARY: Glucose-Capillary: 247 mg/dL — ABNORMAL HIGH (ref 70–99)

## 2018-09-28 LAB — PROTIME-INR
INR: 1.2 (ref 0.8–1.2)
Prothrombin Time: 15.2 seconds (ref 11.4–15.2)

## 2018-09-28 MED ORDER — CEFAZOLIN SODIUM-DEXTROSE 2-4 GM/100ML-% IV SOLN
INTRAVENOUS | Status: AC
  Administered 2018-09-28: 2 g via INTRAVENOUS
  Filled 2018-09-28: qty 100

## 2018-09-28 MED ORDER — FENTANYL CITRATE (PF) 100 MCG/2ML IJ SOLN
INTRAMUSCULAR | Status: AC | PRN
  Administered 2018-09-28: 50 ug via INTRAVENOUS

## 2018-09-28 MED ORDER — MIDAZOLAM HCL 2 MG/2ML IJ SOLN
INTRAMUSCULAR | Status: AC | PRN
  Administered 2018-09-28: 1 mg via INTRAVENOUS

## 2018-09-28 MED ORDER — MIDAZOLAM HCL 2 MG/2ML IJ SOLN
INTRAMUSCULAR | Status: AC
  Filled 2018-09-28: qty 2

## 2018-09-28 MED ORDER — HEPARIN SOD (PORK) LOCK FLUSH 100 UNIT/ML IV SOLN
INTRAVENOUS | Status: AC
  Filled 2018-09-28: qty 5

## 2018-09-28 MED ORDER — LIDOCAINE-EPINEPHRINE (PF) 2 %-1:200000 IJ SOLN
INTRAMUSCULAR | Status: AC
  Administered 2018-09-28: 10 mL
  Filled 2018-09-28: qty 20

## 2018-09-28 MED ORDER — FENTANYL CITRATE (PF) 100 MCG/2ML IJ SOLN
INTRAMUSCULAR | Status: AC
  Filled 2018-09-28: qty 2

## 2018-09-28 MED ORDER — CEFAZOLIN SODIUM-DEXTROSE 2-4 GM/100ML-% IV SOLN
2.0000 g | Freq: Once | INTRAVENOUS | Status: AC
  Administered 2018-09-28: 13:00:00 2 g via INTRAVENOUS

## 2018-09-28 MED ORDER — SODIUM CHLORIDE 0.9 % IV SOLN
INTRAVENOUS | Status: DC
  Administered 2018-09-28: 11:00:00 via INTRAVENOUS

## 2018-09-28 NOTE — Procedures (Signed)
Beverly Davis  S/P RT IJ POWER PORT  Tip svcra No comp Stable ebl min Ready for use Full report in pacs

## 2018-09-28 NOTE — H&P (Signed)
Referring Physician(s): Ennever,Peter R  Supervising Physician: Daryll Brod  Patient Status:  [facility]  Chief Complaint: "I'm here for a port a cath"   Subjective: Patient familiar to IR service from right hepatocellular carcinoma bland embolization on 05/01/2016, microwave ablation of right hepatocellular carcinoma 06/14/2016, and microwave ablation of residual right hepatocellular carcinoma on 01/21/2018.  She now presents with apparent nodal progression of her [facility] and is scheduled today for Port-A-Cath placement for chemotherapy.  She currently denies fever, headache, chest pain, dyspnea, cough, abdominal/back pain, nausea, vomiting or bleeding.  Past Medical History:  Diagnosis Date  . Breast cancer ([place]) 05/2006   Right breast cancer  . Cirrhosis ([place])   . Dementia ([place])   . Diabetes mellitus without complication ([place])    type 2  . Fibroids    PT IS UNAWARE   . Goals of care, counseling/discussion 09/17/2018  . Hepatocellular carcinoma ([place]) 05/08/2016  . Hypothyroidism   . Osteopenia   . Personal history of radiation therapy 2008  . Thrombocytopenia ([place])   . Thyroid disease    Past Surgical History:  Procedure Laterality Date  . ABDOMINAL HYSTERECTOMY     TAH BSO  . BREAST LUMPECTOMY Right 2008  . BREAST LUMPECTOMY WITH AXILLARY LYMPH NODE BIOPSY Right 06/25/2006  . CHOLECYSTECTOMY    . IR GENERIC HISTORICAL  03/28/2016   IR RADIOLOGIST EVAL & MGMT 03/28/2016 Corrie Mckusick, DO [facility] INTERV RAD  . IR GENERIC HISTORICAL  05/01/2016   IR ANGIOGRAM VISCERAL SELECTIVE 05/01/2016 Corrie Mckusick, DO [facility]  . IR GENERIC HISTORICAL  05/01/2016   IR ANGIOGRAM VISCERAL SELECTIVE 05/01/2016 Corrie Mckusick, DO [facility]  . IR GENERIC HISTORICAL  05/01/2016   IR ANGIOGRAM SELECTIVE EACH ADDITIONAL VESSEL 05/01/2016 Corrie Mckusick, DO [facility]  . IR GENERIC HISTORICAL  05/01/2016   IR ANGIOGRAM SELECTIVE EACH ADDITIONAL VESSEL 05/01/2016 Corrie Mckusick, DO [facility]  . IR GENERIC  HISTORICAL  05/01/2016   IR ANGIOGRAM SELECTIVE EACH ADDITIONAL VESSEL 05/01/2016 Corrie Mckusick, DO [facility]  . IR GENERIC HISTORICAL  05/01/2016   IR EMBO TUMOR ORGAN ISCHEMIA INFARCT INC GUIDE ROADMAPPING 05/01/2016 Corrie Mckusick, DO [facility]  . IR GENERIC HISTORICAL  05/01/2016   IR CT SPINE LTD 05/01/2016 Corrie Mckusick, DO [facility]  . IR GENERIC HISTORICAL  05/01/2016   IR US GUIDE VASC ACCESS RIGHT 05/01/2016 Corrie Mckusick, DO [facility]  . IR GENERIC HISTORICAL  05/01/2016   IR ANGIOGRAM SELECTIVE EACH ADDITIONAL VESSEL 05/01/2016 Corrie Mckusick, DO [facility]  . IR RADIOLOGIST EVAL & MGMT  07/31/2016  . IR RADIOLOGIST EVAL & MGMT  12/25/2016  . IR RADIOLOGIST EVAL & MGMT  12/10/2017  . IR RADIOLOGIST EVAL & MGMT  04/21/2018  . IR RADIOLOGIST EVAL & MGMT  08/14/2018  . RADIOFREQUENCY ABLATION N/A 06/14/2016   Procedure: LIVER MICROWAVE THERMAL  ABLATION;  Surgeon: Corrie Mckusick, DO;  Location: [facility];  Service: Anesthesiology;  Laterality: N/A;  . RADIOLOGY WITH ANESTHESIA Right 01/21/2018   Procedure: CT WITH ANESTHESIA/MICROWAVE THERMAL ABLATION OF LIVER;  Surgeon: Corrie Mckusick, DO;  Location: [facility];  Service: Radiology;  Laterality: Right;  . TUBAL LIGATION  1978       Allergies: Metformin and related and Welchol [colesevelam hcl]  Medications: Prior to Admission medications   Medication Sig Start Date End Date Taking? Authorizing Provider  Cholecalciferol (VITAMIN D3) 5000 units CAPS Take 5,000 Units by mouth daily.    [provider]  cyanocobalamin 1000 MCG  tablet Take 1,000 mcg by mouth daily.    [provider]  glipiZIDE (GLUCOTROL) 5 MG tablet Take 1 tablet 3 x /day with meals for Diabetes 03/17/18   Unk Pinto, MD  lactulose (CHRONULAC) 10 GM/15ML solution Take 30 mLs (20 g total) by mouth 3 (three) times daily. Patient taking differently: Take by mouth daily. Takes 20 mls. Daily. 04/03/16   Rolene Course, PA-C  levothyroxine (SYNTHROID) 112 MCG  tablet Take 1 tablet daily on an empty stomach with only water for 30 minutes & no Antacid meds, Calcium or Magnesium for 4 hours & avoid Biotin 07/29/18   Unk Pinto, MD  magnesium oxide (MAG-OX) 400 MG tablet Take 400 mg by mouth daily.     [provider]  metFORMIN (GLUCOPHAGE) 500 MG tablet Take 1 tablet with Breakfast & Lunch and 2 tablets with supper for Diabetes 09/10/18   Unk Pinto, MD  [facility] ULTRA test strip USE 1 STRIP TO [place] DAILY 08/30/18   [provider]  sitaGLIPtin (JANUVIA) 100 MG tablet Take 100 mg by mouth daily.    [provider]     Vital Signs: Blood pressure 116/57, heart rate 68, temp 98.2, respirations 16, O2 sat 100% room air   Physical Exam awake, alert.  Chest clear to auscultation bilaterally.  Heart with regular rate and rhythm, positive murmur.  Abdomen soft, positive bowel sounds, nontender.  Bilateral pretibial edema noted.  Imaging: No results found.  Labs:  CBC: Recent Labs    04/08/18 1022 07/29/18 1116 08/27/18 1043 09/16/18 1325  WBC 2.4* 2.3* 2.1* 2.3*  HGB 10.7* 10.4* 10.2* 10.9*  HCT 31.4* 30.4* 31.0* 32.0*  PLT 95* 101* 95* 103*    COAGS: Recent Labs    01/21/18 1100 07/29/18 1116  INR 1.11 1.2*    BMP: Recent Labs    04/08/18 1022 07/29/18 1116 08/27/18 1043 09/16/18 1325  NA 136 138 140 139  K 4.2 4.2 4.0 4.2  CL 108 108 107 106  CO2 20 21 25 24   GLUCOSE 146* 132* 275* 228*  BUN 14 13 11 9   CALCIUM 8.9 8.9 9.1 9.8  CREATININE 0.77 0.62 0.70 0.66  GFRNONAA 75 88 >60 >60  GFRAA 87 102 >60 >60    LIVER FUNCTION TESTS: Recent Labs    01/09/18 1205 01/21/18 1100 04/08/18 1022 07/29/18 1116 08/27/18 1043 09/16/18 1325  BILITOT 1.4* 1.4* 1.0 1.3* 1.5* 1.4*  AST 58* 64* 46* 57* 69* 68*  ALT 46* 47* 33* 38* 47* 49*  ALKPHOS 80 79  --   --  124 135*  PROT 6.2* 6.5 6.1 6.2 6.4* 6.2*  ALBUMIN 3.1* 3.6  --   --  3.5 3.6    Assessment and Plan: Patient with  history of hepatocellular carcinoma and prior bland embolization as well as microwave ablation in 2018 and 2019.  She now presents with apparent nodal progression of her [facility] and is scheduled today for Port-A-Cath placement for chemotherapy.Risks and benefits of image guided port-a-catheter placement was discussed with the patient and husband including, but not limited to bleeding, infection, pneumothorax, or fibrin sheath development and need for additional procedures.  All of the patient's questions were answered, patient is agreeable to proceed. Consent signed and in chart.  LABS PEND   Electronically Signed: D. Rowe Robert, PA-C 09/28/2018, 10:45 AM   I spent a total of 25 minutes at the the patient's bedside AND on the patient's hospital floor or unit, greater than 50%  of which was counseling/coordinating care for port a cath placement

## 2018-09-28 NOTE — Discharge Instructions (Signed)
Do not use EMLA cream on the skin glue of your new port until the cancer center nurses tell you it is okay. The EMLA cream will dissolve the skin glue and the skin over your port will pull apart resulting in an infection. Use ice in a zip lock bag for 3-4 minutes before they access your port.      Implanted Port Insertion, Care After This sheet gives you information about how to care for yourself after your procedure. Your health care provider may also give you more specific instructions. If you have problems or questions, contact your health care provider. What can I expect after the procedure? After the procedure, it is common to have:  Discomfort at the port insertion site.  Bruising on the skin over the port. This should improve over 3-4 days. Follow these instructions at home: [facility] care  After your port is placed, you will get a manufacturer's information card. The card has information about your port. Keep this card with you at all times.  Take care of the port as told by your health care provider. Ask your health care provider if you or a family member can get training for taking care of the port at home. A home health care nurse may also take care of the port.  Make sure to remember what type of port you have. Incision care      Follow instructions from your health care provider about how to take care of your port insertion site. Make sure you: ? Wash your hands with soap and water before and after you change your bandage (dressing). If soap and water are not available, use hand sanitizer. ? Change your dressing as told by your health care provider. ? Leave stitches (sutures), skin glue, or adhesive strips in place. These skin closures may need to stay in place for 2 weeks or longer. If adhesive strip edges start to loosen and curl up, you may trim the loose edges. Do not remove adhesive strips completely unless your health care provider tells you to do that.  Check your port  insertion site every day for signs of infection. Check for: ? Redness, swelling, or pain. ? Fluid or blood. ? Warmth. ? Pus or a bad smell. Activity  Return to your normal activities as told by your health care provider. Ask your health care provider what activities are safe for you.  Do not lift anything that is heavier than 10 lb (4.5 kg), or the limit that you are told, until your health care provider says that it is safe. General instructions  Take over-the-counter and prescription medicines only as told by your health care provider.  Do not take baths, swim, or use a hot tub until your health care provider approves. Ask your health care provider if you may take showers. You may only be allowed to take sponge baths.  Do not drive for 24 hours if you were given a sedative during your procedure.  Wear a medical alert bracelet in case of an emergency. This will tell any health care providers that you have a port.  Keep all follow-up visits as told by your health care provider. This is important. Contact a health care provider if:  You cannot flush your port with saline as directed, or you cannot draw blood from the port.  You have a fever or chills.  You have redness, swelling, or pain around your port insertion site.  You have fluid or blood coming from your  port insertion site.  Your port insertion site feels warm to the touch.  You have pus or a bad smell coming from the port insertion site. Get help right away if:  You have chest pain or shortness of breath.  You have bleeding from your port that you cannot control. Summary  Take care of the port as told by your health care provider. Keep the manufacturer's information card with you at all times.  Change your dressing as told by your health care provider.  Contact a health care provider if you have a fever or chills or if you have redness, swelling, or pain around your port insertion site.  Keep all follow-up  visits as told by your health care provider. This information is not intended to replace advice given to you by your health care provider. Make sure you discuss any questions you have with your health care provider. Document Released: 12/30/2012 Document Revised: 10/07/2017 Document Reviewed: 10/07/2017 Elsevier Patient Education  [place].     Moderate Conscious Sedation, Adult, Care After These instructions provide you with information about caring for yourself after your procedure. Your health care provider may also give you more specific instructions. Your treatment has been planned according to current medical practices, but problems sometimes occur. Call your health care provider if you have any problems or questions after your procedure. What can I expect after the procedure? After your procedure, it is common:  To feel sleepy for several hours.  To feel clumsy and have poor balance for several hours.  To have poor judgment for several hours.  To vomit if you eat too soon. Follow these instructions at home: For at least 24 hours after the procedure:   Do not: ? Participate in activities where you could fall or become injured. ? Drive. ? Use heavy machinery. ? Drink alcohol. ? Take sleeping pills or medicines that cause drowsiness. ? Make important decisions or sign legal documents. ? Take care of children on your own.  Rest. Eating and drinking  Follow the diet recommended by your health care provider.  If you vomit: ? Drink water, juice, or soup when you can drink without vomiting. ? Make sure you have little or no nausea before eating solid foods. General instructions  Have a responsible adult stay with you until you are awake and alert.  Take over-the-counter and prescription medicines only as told by your health care provider.  If you smoke, do not smoke without supervision.  Keep all follow-up visits as told by your health care provider. This is  important. Contact a health care provider if:  You keep feeling nauseous or you keep vomiting.  You feel light-headed.  You develop a rash.  You have a fever. Get help right away if:  You have trouble breathing. This information is not intended to replace advice given to you by your health care provider. Make sure you discuss any questions you have with your health care provider. Document Released: 12/30/2012 Document Revised: 02/21/2017 Document Reviewed: 07/01/2015 Elsevier Patient Education  2020 [employer].

## 2018-09-29 ENCOUNTER — Inpatient Hospital Stay: Payer: PPO

## 2018-09-29 ENCOUNTER — Encounter: Payer: Self-pay | Admitting: Family

## 2018-09-29 ENCOUNTER — Inpatient Hospital Stay (HOSPITAL_BASED_OUTPATIENT_CLINIC_OR_DEPARTMENT_OTHER): Payer: PPO | Admitting: Family

## 2018-09-29 ENCOUNTER — Inpatient Hospital Stay: Payer: PPO | Attending: Hematology & Oncology

## 2018-09-29 VITALS — BP 115/55 | HR 72 | Temp 97.9°F | Resp 17 | Wt 145.1 lb

## 2018-09-29 DIAGNOSIS — C220 Liver cell carcinoma: Secondary | ICD-10-CM | POA: Insufficient documentation

## 2018-09-29 DIAGNOSIS — Z7982 Long term (current) use of aspirin: Secondary | ICD-10-CM | POA: Diagnosis not present

## 2018-09-29 DIAGNOSIS — Z79899 Other long term (current) drug therapy: Secondary | ICD-10-CM | POA: Diagnosis not present

## 2018-09-29 DIAGNOSIS — K7581 Nonalcoholic steatohepatitis (NASH): Secondary | ICD-10-CM | POA: Diagnosis not present

## 2018-09-29 DIAGNOSIS — Z170 Estrogen receptor positive status [ER+]: Secondary | ICD-10-CM | POA: Insufficient documentation

## 2018-09-29 DIAGNOSIS — Z7984 Long term (current) use of oral hypoglycemic drugs: Secondary | ICD-10-CM

## 2018-09-29 DIAGNOSIS — Z853 Personal history of malignant neoplasm of breast: Secondary | ICD-10-CM | POA: Insufficient documentation

## 2018-09-29 DIAGNOSIS — K7460 Unspecified cirrhosis of liver: Secondary | ICD-10-CM | POA: Insufficient documentation

## 2018-09-29 DIAGNOSIS — Z5112 Encounter for antineoplastic immunotherapy: Secondary | ICD-10-CM | POA: Insufficient documentation

## 2018-09-29 DIAGNOSIS — Z8505 Personal history of malignant neoplasm of liver: Secondary | ICD-10-CM

## 2018-09-29 LAB — CMP (CANCER CENTER ONLY)
ALT: 37 U/L (ref 0–44)
AST: 58 U/L — ABNORMAL HIGH (ref 15–41)
Albumin: 3.2 g/dL — ABNORMAL LOW (ref 3.5–5.0)
Alkaline Phosphatase: 121 U/L (ref 38–126)
Anion gap: 8 (ref 5–15)
BUN: 11 mg/dL (ref 8–23)
CO2: 24 mmol/L (ref 22–32)
Calcium: 8.5 mg/dL — ABNORMAL LOW (ref 8.9–10.3)
Chloride: 107 mmol/L (ref 98–111)
Creatinine: 0.71 mg/dL (ref 0.44–1.00)
GFR, Est AFR Am: >60 mL/min (ref >60)
GFR, Estimated: >60 mL/min (ref >60)
Glucose, Bld: 232 mg/dL — ABNORMAL HIGH (ref 70–99)
Potassium: 4 mmol/L (ref 3.5–5.1)
Sodium: 139 mmol/L (ref 135–145)
Total Bilirubin: 1.3 mg/dL — ABNORMAL HIGH (ref 0.3–1.2)
Total Protein: 5.9 g/dL — ABNORMAL LOW (ref 6.5–8.1)

## 2018-09-29 LAB — CBC WITH DIFFERENTIAL (CANCER CENTER ONLY)
Abs Immature Granulocytes: 0.01 10*3/uL (ref 0.00–0.07)
Basophils Absolute: 0 10*3/uL (ref 0.0–0.1)
Basophils Relative: 1 %
Eosinophils Absolute: 0 10*3/uL (ref 0.0–0.5)
Eosinophils Relative: 2 %
HCT: 30 % — ABNORMAL LOW (ref 36.0–46.0)
Hemoglobin: 9.8 g/dL — ABNORMAL LOW (ref 12.0–15.0)
Immature Granulocytes: 1 %
Lymphocytes Relative: 20 %
Lymphs Abs: 0.4 10*3/uL — ABNORMAL LOW (ref 0.7–4.0)
MCH: 35.5 pg — ABNORMAL HIGH (ref 26.0–34.0)
MCHC: 32.7 g/dL (ref 30.0–36.0)
MCV: 108.7 fL — ABNORMAL HIGH (ref 80.0–100.0)
Monocytes Absolute: 0.3 10*3/uL (ref 0.1–1.0)
Monocytes Relative: 14 %
Neutro Abs: 1.4 10*3/uL — ABNORMAL LOW (ref 1.7–7.7)
Neutrophils Relative %: 62 %
Platelet Count: 82 10*3/uL — ABNORMAL LOW (ref 150–400)
RBC: 2.76 MIL/uL — ABNORMAL LOW (ref 3.87–5.11)
RDW: 14.7 % (ref 11.5–15.5)
WBC Count: 2.2 10*3/uL — ABNORMAL LOW (ref 4.0–10.5)
nRBC: 0 % (ref 0.0–0.2)

## 2018-09-29 MED ORDER — HEPARIN SOD (PORK) LOCK FLUSH 100 UNIT/ML IV SOLN
500.0000 [IU] | Freq: Once | INTRAVENOUS | Status: AC | PRN
  Administered 2018-09-29: 500 [IU]
  Filled 2018-09-29: qty 5

## 2018-09-29 MED ORDER — PROCHLORPERAZINE MALEATE 10 MG PO TABS: 10.0000 mg | ORAL_TABLET | Freq: Four times a day (QID) | ORAL | 1 refills | Status: DC | PRN

## 2018-09-29 MED ORDER — SODIUM CHLORIDE 0.9% FLUSH
10.0000 mL | INTRAVENOUS | Status: DC | PRN
  Administered 2018-09-29: 10 mL
  Filled 2018-09-29: qty 10

## 2018-09-29 MED ORDER — SODIUM CHLORIDE 0.9% FLUSH
3.0000 mL | INTRAVENOUS | Status: DC | PRN
  Filled 2018-09-29: qty 10

## 2018-09-29 MED ORDER — SODIUM CHLORIDE 0.9 % IV SOLN
Freq: Once | INTRAVENOUS | Status: AC
  Administered 2018-09-29: 12:00:00 via INTRAVENOUS
  Filled 2018-09-29: qty 250

## 2018-09-29 MED ORDER — ONDANSETRON HCL 8 MG PO TABS: 8.0000 mg | ORAL_TABLET | Freq: Two times a day (BID) | ORAL | 1 refills | Status: DC | PRN

## 2018-09-29 MED ORDER — HEPARIN SOD (PORK) LOCK FLUSH 100 UNIT/ML IV SOLN
250.0000 [IU] | Freq: Once | INTRAVENOUS | Status: DC | PRN
  Filled 2018-09-29: qty 5

## 2018-09-29 MED ORDER — ALTEPLASE 2 MG IJ SOLR
2.0000 mg | Freq: Once | INTRAMUSCULAR | Status: DC | PRN
  Filled 2018-09-29: qty 2

## 2018-09-29 MED ORDER — LIDOCAINE-PRILOCAINE 2.5-2.5 % EX CREA: TOPICAL_CREAM | CUTANEOUS | 3 refills | Status: DC

## 2018-09-29 MED ORDER — SODIUM CHLORIDE 0.9 % IV SOLN
1200.0000 mg | Freq: Once | INTRAVENOUS | Status: AC
  Administered 2018-09-29: 1200 mg via INTRAVENOUS
  Filled 2018-09-29: qty 20

## 2018-09-29 MED ORDER — SODIUM CHLORIDE 0.9 % IV SOLN: 15.2000 mg/kg | Freq: Once | INTRAVENOUS | Status: DC

## 2018-09-29 NOTE — Patient Instructions (Signed)

## 2018-09-29 NOTE — Progress Notes (Signed)
Hematology and Oncology Follow Up Visit  Beverly Davis [id number] [birth year] 78 y.o. 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 She is doing well right now but does note some fatigue at times.  WBC count is 2.2, Hgb 9.8 and platelets 82. She denies any issues with bleeding. No bruising or petechiae.  She has her new port in place and it was accessed this morning without any problems.  No fever, chills, n/v, cough, rash, dizziness, SOB, chest pain, palpitations, abdominal pain or changes in bowel or bladder habits.  No swelling, tenderness, numbness or tingling in her extremities at this time. She has occasionally puffiness in her ankles.  No lymphadenopathy noted on exam.  She states that she has a good appetite but admits that she needs to work harder at hydrating well throughout the day. Her weight is stable.   ECOG Performance Status: 1 - Symptomatic but completely ambulatory  Medications:  Allergies as of 09/29/2018      Reactions   Metformin And Related Other (See Comments)   Unknown   Welchol [colesevelam Hcl] Other (See Comments)   Unknown      Medication List       Accurate as of September 29, 2018 11:51 AM. If you have any questions, ask your nurse or doctor.        cyanocobalamin 1000 MCG tablet Take 1,000 mcg by mouth daily.   glipiZIDE 5 MG tablet Commonly known as: GLUCOTROL Take 1 tablet 3 x /day with meals for Diabetes   Januvia 100 MG tablet Generic drug: sitaGLIPtin Take 100 mg by mouth daily.   lactulose 10 GM/15ML solution Commonly  known as: CHRONULAC Take 30 mLs (20 g total) by mouth 3 (three) times daily. What changed:   how much to take  when to take this  additional instructions   levothyroxine 112 MCG tablet Commonly known as: Synthroid Take 1 tablet daily on an empty stomach with only water for 30 minutes & no Antacid meds, Calcium or Magnesium for 4 hours & avoid Biotin   lidocaine-prilocaine cream Commonly known as: EMLA Apply to affected area once   magnesium oxide 400 MG tablet Commonly known as: MAG-OX Take 400 mg by mouth daily.   metFORMIN 500 MG tablet Commonly known as: Glucophage Take 1 tablet with Breakfast & Lunch and 2 tablets with supper for Diabetes   ondansetron 8 MG tablet Commonly known as: Zofran Take 1 tablet (8 mg total) by mouth 2 (two) times daily as needed (Nausea or vomiting).   OneTouch Ultra test strip Generic drug: glucose blood USE 1 STRIP TO CHECK GLUCOSE ONCE DAILY   prochlorperazine 10 MG tablet Commonly known as: COMPAZINE Take 1 tablet (10 mg total) by mouth every 6 (six) hours as needed (Nausea or vomiting).   Vitamin D3 125 MCG (5000 UT) Caps Take 5,000 Units by mouth daily.       Allergies:  Allergies  Allergen Reactions  . Metformin And Related Other (See Comments)    Unknown  . Welchol [Colesevelam Hcl] Other (See Comments)    Unknown    Past Medical History, Surgical history, Social history,  and Family History were reviewed and updated.  Review of Systems: All other 10 point review of systems is negative.   Physical Exam:  weight is 145 lb 1.3 oz (65.8 kg). Her oral temperature is 97.9 F (36.6 C). Her blood pressure is 115/55 (abnormal) and her pulse is 72. Her respiration is 17 and oxygen saturation is 100%.   Wt Readings from Last 3 Encounters:  09/29/18 145 lb 1.3 oz (65.8 kg)  09/16/18 144 lb (65.3 kg)  08/27/18 144 lb (65.3 kg)    Ocular: Sclerae unicteric, pupils equal, round and reactive to light Ear-nose-throat:  Oropharynx clear, dentition fair Lymphatic: No cervical or supraclavicular adenopathy Lungs no rales or rhonchi, good excursion bilaterally Heart regular rate and rhythm, no murmur appreciated Abd soft, nontender, positive bowel sounds, no liver or spleen tip palpated on exam, no fluid wave  MSK no focal spinal tenderness, no joint edema Neuro: non-focal, well-oriented, appropriate affect Breasts: Deferred   Lab Results  Component Value Date   WBC 2.2 (L) 09/29/2018   HGB 9.8 (L) 09/29/2018   HCT 30.0 (L) 09/29/2018   MCV 108.7 (H) 09/29/2018   PLT 82 (L) 09/29/2018   Lab Results  Component Value Date   FERRITIN 119 03/23/2016   IRON 36 03/23/2016   TIBC 234 (L) 03/23/2016   UIBC 198 03/23/2016   IRONPCTSAT 15 03/23/2016   Lab Results  Component Value Date   RETICCTPCT 1.2 09/05/2015   RBC 2.76 (L) 09/29/2018   RETICCTABS 40,080 09/05/2015   No results found for: KPAFRELGTCHN, LAMBDASER, KAPLAMBRATIO No results found for: Kandis Cocking, [facility] Lab Results  Component Value Date   TOTALPROTELP 7.1 02/03/2008   ALBUMINELP 54.8 (L) 02/03/2008   A1GS 4.2 02/03/2008   A2GS 9.5 02/03/2008   BETS 8.1 (H) 02/03/2008   BETA2SER 5.8 02/03/2008   GAMS 17.6 02/03/2008   MSPIKE NOT DET 02/03/2008   SPEI * 02/03/2008     Chemistry      Component Value Date/Time   NA 139 09/16/2018 1325   NA 139 01/01/2017 1034   K 4.2 09/16/2018 1325   K 4.1 01/01/2017 1034   CL 106 09/16/2018 1325   CL 107 01/01/2017 1034   CO2 24 09/16/2018 1325   CO2 29 01/01/2017 1034   BUN 9 09/16/2018 1325   BUN 9 01/01/2017 1034   CREATININE 0.66 09/16/2018 1325   CREATININE 0.62 07/29/2018 1116      Component Value Date/Time   CALCIUM 9.8 09/16/2018 1325   CALCIUM 9.4 01/01/2017 1034   ALKPHOS 135 (H) 09/16/2018 1325   ALKPHOS 81 01/01/2017 1034   AST 68 (H) 09/16/2018 1325   ALT 49 (H) 09/16/2018 1325   ALT 48 (H) 01/01/2017 1034   BILITOT 1.4 (H) 09/16/2018 1325       Impression  and Plan: Beverly Davis is a very pleasant 78 yo caucasian female with 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  She is here today to start treatment with Tecentriq/Avastin. We will proceed with cycle 1 as planned per Dr. Marin Olp.  We will complete 4 cycles and then repeat scans.  AFP result for today is pending.  We will plan to see her back in another 3 weeks.  She promises to contact our office with any questions or concerns. We can certainly see her sooner if needed.   Laverna Peace, NP 7/7/202011:51 AM

## 2018-09-29 NOTE — Progress Notes (Signed)
Per Judson Roch, NP ok to treat with platelets of 82 & ANC 1.2. dph

## 2018-09-29 NOTE — Patient Instructions (Signed)
Atezolizumab injection What is this medicine? ATEZOLIZUMAB (a te zoe LIZ ue mab) is a monoclonal antibody. It is used to treat bladder cancer (urothelial cancer), non-small cell lung cancer, small cell lung cancer, and breast cancer. This medicine may be used for other purposes; ask your health care provider or pharmacist if you have questions. COMMON BRAND NAME(S): Tecentriq What should I tell my health care provider before I take this medicine? They need to know if you have any of these conditions:  diabetes  immune system problems  infection  inflammatory bowel disease  liver disease  lung or breathing disease  lupus  nervous system problems like myasthenia gravis or Guillain-Barre syndrome  organ transplant  an unusual or allergic reaction to atezolizumab, other medicines, foods, dyes, or preservatives  pregnant or trying to get pregnant  breast-feeding How should I use this medicine? This medicine is for infusion into a vein. It is given by a health care professional in a hospital or clinic setting. A special MedGuide will be given to you before each treatment. Be sure to read this information carefully each time. Talk to your pediatrician regarding the use of this medicine in children. Special care may be needed. Overdosage: If you think you have taken too much of this medicine contact a poison control center or emergency room at once. NOTE: This medicine is only for you. Do not share this medicine with others. What if I miss a dose? It is important not to miss your dose. Call your doctor or health care professional if you are unable to keep an appointment. What may interact with this medicine? Interactions have not been studied. This list may not describe all possible interactions. Give your health care provider a list of all the medicines, herbs, non-prescription drugs, or dietary supplements you use. Also tell them if you smoke, drink alcohol, or use illegal drugs.  Some items may interact with your medicine. What should I watch for while using this medicine? Your condition will be monitored carefully while you are receiving this medicine. You may need blood work done while you are taking this medicine. Do not become pregnant while taking this medicine or for at least 5 months after stopping it. Women should inform their doctor if they wish to become pregnant or think they might be pregnant. There is a potential for serious side effects to an unborn child. Talk to your health care professional or pharmacist for more information. Do not breast-feed an infant while taking this medicine or for at least 5 months after the last dose. What side effects may I notice from receiving this medicine? Side effects that you should report to your doctor or health care professional as soon as possible:  allergic reactions like skin rash, itching or hives, swelling of the face, lips, or tongue  black, tarry stools  bloody or watery diarrhea  breathing problems  changes in vision  chest pain or chest tightness  chills  facial flushing  fever  headache  signs and symptoms of high blood sugar such as dizziness; dry mouth; dry skin; fruity breath; nausea; stomach pain; increased hunger or thirst; increased urination  signs and symptoms of liver injury like dark yellow or brown urine; general ill feeling or flu-like symptoms; light-colored stools; loss of appetite; nausea; right upper belly pain; unusually weak or tired; yellowing of the eyes or skin  stomach pain  trouble passing urine or change in the amount of urine Side effects that usually do not require medical  attention (report to your doctor or health care professional if they continue or are bothersome):  cough  diarrhea  joint pain  muscle pain  muscle weakness  tiredness  weight loss This list may not describe all possible side effects. Call your doctor for medical advice about side  effects. You may report side effects to FDA at 1-800-FDA-1088. Where should I keep my medicine? This drug is given in a hospital or clinic and will not be stored at home. NOTE: This sheet is a summary. It may not cover all possible information. If you have questions about this medicine, talk to your doctor, pharmacist, or health care provider.  2020 Elsevier/Gold Standard (2017-06-13 09:33:38)

## 2018-09-29 NOTE — Progress Notes (Signed)
Patient had a port placed yesterday.   Avastin will be held today per Dr. Marin Olp, she will still receive Tecentriq.Marland Kitchen Patient will return in 1 week for Avasatin 10 mg/kg x 1 dose. She will begin Tecentriq with Avastin 15 mg/kg 3 weeks from today's dose of Tecentriq.

## 2018-09-30 ENCOUNTER — Telehealth: Payer: Self-pay | Admitting: *Deleted

## 2018-09-30 LAB — AFP TUMOR MARKER: AFP, Serum, Tumor Marker: 347 ng/mL — ABNORMAL HIGH (ref 0.0–8.3)

## 2018-09-30 NOTE — Telephone Encounter (Signed)
Call received from patient concerned about "tape" on her port that she was told to take off yesterday.  In speaking with patient, the tape she is referring to is the band-aid after port deaccess which she did take off once she got home.  Pt instructed to keep port area clean and to not pick or scrub at glue on port.  Teach back done.  Pt appreciative of assistance and has no further questions or concerns.

## 2018-10-05 ENCOUNTER — Other Ambulatory Visit: Payer: Self-pay | Admitting: Hematology & Oncology

## 2018-10-06 ENCOUNTER — Inpatient Hospital Stay: Payer: PPO

## 2018-10-06 ENCOUNTER — Other Ambulatory Visit: Payer: Self-pay

## 2018-10-06 VITALS — BP 125/52 | HR 76 | Temp 99.1°F | Resp 20

## 2018-10-06 DIAGNOSIS — Z8505 Personal history of malignant neoplasm of liver: Secondary | ICD-10-CM

## 2018-10-06 DIAGNOSIS — Z5111 Encounter for antineoplastic chemotherapy: Secondary | ICD-10-CM

## 2018-10-06 DIAGNOSIS — Z5112 Encounter for antineoplastic immunotherapy: Secondary | ICD-10-CM | POA: Diagnosis not present

## 2018-10-06 LAB — TOTAL PROTEIN, URINE DIPSTICK: Protein, ur: NEGATIVE mg/dL

## 2018-10-06 MED ORDER — HEPARIN SOD (PORK) LOCK FLUSH 100 UNIT/ML IV SOLN
500.0000 [IU] | Freq: Once | INTRAVENOUS | Status: AC | PRN
  Administered 2018-10-06: 16:00:00 500 [IU]
  Filled 2018-10-06: qty 5

## 2018-10-06 MED ORDER — SODIUM CHLORIDE 0.9 % IV SOLN
10.6000 mg/kg | Freq: Once | INTRAVENOUS | Status: AC
  Administered 2018-10-06: 700 mg via INTRAVENOUS
  Filled 2018-10-06: qty 16

## 2018-10-06 MED ORDER — SODIUM CHLORIDE 0.9% FLUSH
10.0000 mL | INTRAVENOUS | Status: DC | PRN
  Administered 2018-10-06: 16:00:00 10 mL
  Filled 2018-10-06: qty 10

## 2018-10-06 MED ORDER — SODIUM CHLORIDE 0.9 % IV SOLN
Freq: Once | INTRAVENOUS | Status: AC
  Administered 2018-10-06: 15:00:00 via INTRAVENOUS
  Filled 2018-10-06: qty 250

## 2018-10-06 NOTE — Patient Instructions (Signed)
Bevacizumab injection What is this medicine? BEVACIZUMAB (be va SIZ yoo mab) is a monoclonal antibody. It is used to treat many types of cancer. This medicine may be used for other purposes; ask your health care provider or pharmacist if you have questions. COMMON BRAND NAME(S): Avastin, MVASI, Zirabev What should I tell my health care provider before I take this medicine? They need to know if you have any of these conditions:  diabetes  heart disease  high blood pressure  history of coughing up blood  prior anthracycline chemotherapy (e.g., doxorubicin, daunorubicin, epirubicin)  recent or ongoing radiation therapy  recent or planning to have surgery  stroke  an unusual or allergic reaction to bevacizumab, hamster proteins, mouse proteins, other medicines, foods, dyes, or preservatives  pregnant or trying to get pregnant  breast-feeding How should I use this medicine? This medicine is for infusion into a vein. It is given by a health care professional in a hospital or clinic setting. Talk to your pediatrician regarding the use of this medicine in children. Special care may be needed. Overdosage: If you think you have taken too much of this medicine contact a poison control center or emergency room at once. NOTE: This medicine is only for you. Do not share this medicine with others. What if I miss a dose? It is important not to miss your dose. Call your doctor or health care professional if you are unable to keep an appointment. What may interact with this medicine? Interactions are not expected. This list may not describe all possible interactions. Give your health care provider a list of all the medicines, herbs, non-prescription drugs, or dietary supplements you use. Also tell them if you smoke, drink alcohol, or use illegal drugs. Some items may interact with your medicine. What should I watch for while using this medicine? Your condition will be monitored carefully while  you are receiving this medicine. You will need important blood work and urine testing done while you are taking this medicine. This medicine may increase your risk to bruise or bleed. Call your doctor or health care professional if you notice any unusual bleeding. This medicine should be started at least 28 days following major surgery and the site of the surgery should be totally healed. Check with your doctor before scheduling dental work or surgery while you are receiving this treatment. Talk to your doctor if you have recently had surgery or if you have a wound that has not healed. Do not become pregnant while taking this medicine or for 6 months after stopping it. Women should inform their doctor if they wish to become pregnant or think they might be pregnant. There is a potential for serious side effects to an unborn child. Talk to your health care professional or pharmacist for more information. Do not breast-feed an infant while taking this medicine and for 6 months after the last dose. This medicine has caused ovarian failure in some women. This medicine may interfere with the ability to have a child. You should talk to your doctor or health care professional if you are concerned about your fertility. What side effects may I notice from receiving this medicine? Side effects that you should report to your doctor or health care professional as soon as possible:  allergic reactions like skin rash, itching or hives, swelling of the face, lips, or tongue  chest pain or chest tightness  chills  coughing up blood  high fever  seizures  severe constipation  signs and symptoms  of bleeding such as bloody or black, tarry stools; red or dark-brown urine; spitting up blood or brown material that looks like coffee grounds; red spots on the skin; unusual bruising or bleeding from the eye, gums, or nose  signs and symptoms of a blood clot such as breathing problems; chest pain; severe, sudden  headache; pain, swelling, warmth in the leg  signs and symptoms of a stroke like changes in vision; confusion; trouble speaking or understanding; severe headaches; sudden numbness or weakness of the face, arm or leg; trouble walking; dizziness; loss of balance or coordination  stomach pain  sweating  swelling of legs or ankles  vomiting  weight gain Side effects that usually do not require medical attention (report to your doctor or health care professional if they continue or are bothersome):  back pain  changes in taste  decreased appetite  dry skin  nausea  tiredness This list may not describe all possible side effects. Call your doctor for medical advice about side effects. You may report side effects to FDA at 1-800-FDA-1088. Where should I keep my medicine? This drug is given in a hospital or clinic and will not be stored at home. NOTE: This sheet is a summary. It may not cover all possible information. If you have questions about this medicine, talk to your doctor, pharmacist, or health care provider.  2020 Elsevier/Gold Standard (2016-03-08 14:33:29)

## 2018-10-20 ENCOUNTER — Encounter: Payer: Self-pay | Admitting: Family

## 2018-10-20 ENCOUNTER — Other Ambulatory Visit: Payer: Self-pay

## 2018-10-20 ENCOUNTER — Inpatient Hospital Stay (HOSPITAL_BASED_OUTPATIENT_CLINIC_OR_DEPARTMENT_OTHER): Payer: PPO | Admitting: Family

## 2018-10-20 ENCOUNTER — Inpatient Hospital Stay: Payer: PPO

## 2018-10-20 ENCOUNTER — Telehealth: Payer: Self-pay | Admitting: Hematology & Oncology

## 2018-10-20 VITALS — BP 116/52 | HR 77 | Temp 98.0°F | Resp 19 | Ht 62.0 in | Wt 143.2 lb

## 2018-10-20 DIAGNOSIS — Z170 Estrogen receptor positive status [ER+]: Secondary | ICD-10-CM | POA: Diagnosis not present

## 2018-10-20 DIAGNOSIS — Z8505 Personal history of malignant neoplasm of liver: Secondary | ICD-10-CM

## 2018-10-20 DIAGNOSIS — K7460 Unspecified cirrhosis of liver: Secondary | ICD-10-CM

## 2018-10-20 DIAGNOSIS — E1122 Type 2 diabetes mellitus with diabetic chronic kidney disease: Secondary | ICD-10-CM

## 2018-10-20 DIAGNOSIS — C220 Liver cell carcinoma: Secondary | ICD-10-CM

## 2018-10-20 DIAGNOSIS — Z79899 Other long term (current) drug therapy: Secondary | ICD-10-CM

## 2018-10-20 DIAGNOSIS — Z7982 Long term (current) use of aspirin: Secondary | ICD-10-CM | POA: Diagnosis not present

## 2018-10-20 DIAGNOSIS — N182 Chronic kidney disease, stage 2 (mild): Secondary | ICD-10-CM

## 2018-10-20 DIAGNOSIS — Z853 Personal history of malignant neoplasm of breast: Secondary | ICD-10-CM | POA: Diagnosis not present

## 2018-10-20 DIAGNOSIS — K7581 Nonalcoholic steatohepatitis (NASH): Secondary | ICD-10-CM

## 2018-10-20 DIAGNOSIS — Z5112 Encounter for antineoplastic immunotherapy: Secondary | ICD-10-CM | POA: Diagnosis not present

## 2018-10-20 DIAGNOSIS — Z7984 Long term (current) use of oral hypoglycemic drugs: Secondary | ICD-10-CM | POA: Diagnosis not present

## 2018-10-20 LAB — COMPREHENSIVE METABOLIC PANEL
ALT: 44 U/L (ref 0–44)
AST: 64 U/L — ABNORMAL HIGH (ref 15–41)
Albumin: 3 g/dL — ABNORMAL LOW (ref 3.5–5.0)
Alkaline Phosphatase: 114 U/L (ref 38–126)
Anion gap: 6 (ref 5–15)
BUN: 9 mg/dL (ref 8–23)
CO2: 23 mmol/L (ref 22–32)
Calcium: 8.6 mg/dL — ABNORMAL LOW (ref 8.9–10.3)
Chloride: 106 mmol/L (ref 98–111)
Creatinine, Ser: 0.54 mg/dL (ref 0.44–1.00)
GFR calc Af Amer: >60 mL/min (ref >60)
GFR calc non Af Amer: >60 mL/min (ref >60)
Glucose, Bld: 327 mg/dL — ABNORMAL HIGH (ref 70–99)
Potassium: 4.2 mmol/L (ref 3.5–5.1)
Sodium: 135 mmol/L (ref 135–145)
Total Bilirubin: 1.9 mg/dL — ABNORMAL HIGH (ref 0.3–1.2)
Total Protein: 5.6 g/dL — ABNORMAL LOW (ref 6.5–8.1)

## 2018-10-20 LAB — CBC WITH DIFFERENTIAL/PLATELET
Abs Immature Granulocytes: 0.01 10*3/uL (ref 0.00–0.07)
Basophils Absolute: 0 10*3/uL (ref 0.0–0.1)
Basophils Relative: 1 %
Eosinophils Absolute: 0.1 10*3/uL (ref 0.0–0.5)
Eosinophils Relative: 4 %
HCT: 29.2 % — ABNORMAL LOW (ref 36.0–46.0)
Hemoglobin: 9.7 g/dL — ABNORMAL LOW (ref 12.0–15.0)
Immature Granulocytes: 1 %
Lymphocytes Relative: 31 %
Lymphs Abs: 0.6 10*3/uL — ABNORMAL LOW (ref 0.7–4.0)
MCH: 35.1 pg — ABNORMAL HIGH (ref 26.0–34.0)
MCHC: 33.2 g/dL (ref 30.0–36.0)
MCV: 105.8 fL — ABNORMAL HIGH (ref 80.0–100.0)
Monocytes Absolute: 0.3 10*3/uL (ref 0.1–1.0)
Monocytes Relative: 17 %
Neutro Abs: 0.9 10*3/uL — ABNORMAL LOW (ref 1.7–7.7)
Neutrophils Relative %: 46 %
Platelets: 73 10*3/uL — ABNORMAL LOW (ref 150–400)
RBC: 2.76 MIL/uL — ABNORMAL LOW (ref 3.87–5.11)
RDW: 15.5 % (ref 11.5–15.5)
WBC: 1.9 10*3/uL — ABNORMAL LOW (ref 4.0–10.5)
nRBC: 0 % (ref 0.0–0.2)

## 2018-10-20 MED ORDER — SODIUM CHLORIDE 0.9 % IV SOLN
15.5000 mg/kg | Freq: Once | INTRAVENOUS | Status: AC
  Administered 2018-10-20: 1000 mg via INTRAVENOUS
  Filled 2018-10-20: qty 8

## 2018-10-20 MED ORDER — SODIUM CHLORIDE 0.9 % IV SOLN
1200.0000 mg | Freq: Once | INTRAVENOUS | Status: AC
  Administered 2018-10-20: 14:00:00 1200 mg via INTRAVENOUS
  Filled 2018-10-20: qty 20

## 2018-10-20 MED ORDER — INSULIN REGULAR HUMAN 100 UNIT/ML IJ SOLN
10.0000 [IU] | Freq: Once | INTRAMUSCULAR | Status: AC
  Administered 2018-10-20: 13:00:00 10 [IU] via SUBCUTANEOUS

## 2018-10-20 MED ORDER — SODIUM CHLORIDE 0.9 % IV SOLN
Freq: Once | INTRAVENOUS | Status: AC
  Administered 2018-10-20: 13:00:00 via INTRAVENOUS
  Filled 2018-10-20: qty 250

## 2018-10-20 MED ORDER — SODIUM CHLORIDE 0.9% FLUSH
10.0000 mL | INTRAVENOUS | Status: DC | PRN
  Administered 2018-10-20: 10 mL
  Filled 2018-10-20: qty 10

## 2018-10-20 MED ORDER — HEPARIN SOD (PORK) LOCK FLUSH 100 UNIT/ML IV SOLN
500.0000 [IU] | Freq: Once | INTRAVENOUS | Status: AC | PRN
  Administered 2018-10-20: 500 [IU]
  Filled 2018-10-20: qty 5

## 2018-10-20 NOTE — Patient Instructions (Signed)

## 2018-10-20 NOTE — Telephone Encounter (Signed)
Appointments scheduled schedule printed per 7/28 los

## 2018-10-20 NOTE — Patient Instructions (Signed)
Atezolizumab injection What is this medicine? ATEZOLIZUMAB (a te zoe LIZ ue mab) is a monoclonal antibody. It is used to treat bladder cancer (urothelial cancer), non-small cell lung cancer, small cell lung cancer, and breast cancer. This medicine may be used for other purposes; ask your health care provider or pharmacist if you have questions. COMMON BRAND NAME(S): Tecentriq What should I tell my health care provider before I take this medicine? They need to know if you have any of these conditions:  diabetes  immune system problems  infection  inflammatory bowel disease  liver disease  lung or breathing disease  lupus  nervous system problems like myasthenia gravis or Guillain-Barre syndrome  organ transplant  an unusual or allergic reaction to atezolizumab, other medicines, foods, dyes, or preservatives  pregnant or trying to get pregnant  breast-feeding How should I use this medicine? This medicine is for infusion into a vein. It is given by a health care professional in a hospital or clinic setting. A special MedGuide will be given to you before each treatment. Be sure to read this information carefully each time. Talk to your pediatrician regarding the use of this medicine in children. Special care may be needed. Overdosage: If you think you have taken too much of this medicine contact a poison control center or emergency room at once. NOTE: This medicine is only for you. Do not share this medicine with others. What if I miss a dose? It is important not to miss your dose. Call your doctor or health care professional if you are unable to keep an appointment. What may interact with this medicine? Interactions have not been studied. This list may not describe all possible interactions. Give your health care provider a list of all the medicines, herbs, non-prescription drugs, or dietary supplements you use. Also tell them if you smoke, drink alcohol, or use illegal drugs.  Some items may interact with your medicine. What should I watch for while using this medicine? Your condition will be monitored carefully while you are receiving this medicine. You may need blood work done while you are taking this medicine. Do not become pregnant while taking this medicine or for at least 5 months after stopping it. Women should inform their doctor if they wish to become pregnant or think they might be pregnant. There is a potential for serious side effects to an unborn child. Talk to your health care professional or pharmacist for more information. Do not breast-feed an infant while taking this medicine or for at least 5 months after the last dose. What side effects may I notice from receiving this medicine? Side effects that you should report to your doctor or health care professional as soon as possible:  allergic reactions like skin rash, itching or hives, swelling of the face, lips, or tongue  black, tarry stools  bloody or watery diarrhea  breathing problems  changes in vision  chest pain or chest tightness  chills  facial flushing  fever  headache  signs and symptoms of high blood sugar such as dizziness; dry mouth; dry skin; fruity breath; nausea; stomach pain; increased hunger or thirst; increased urination  signs and symptoms of liver injury like dark yellow or brown urine; general ill feeling or flu-like symptoms; light-colored stools; loss of appetite; nausea; right upper belly pain; unusually weak or tired; yellowing of the eyes or skin  stomach pain  trouble passing urine or change in the amount of urine Side effects that usually do not require medical  attention (report to your doctor or health care professional if they continue or are bothersome):  cough  diarrhea  joint pain  muscle pain  muscle weakness  tiredness  weight loss This list may not describe all possible side effects. Call your doctor for medical advice about side  effects. You may report side effects to FDA at 1-800-FDA-1088. Where should I keep my medicine? This drug is given in a hospital or clinic and will not be stored at home. NOTE: This sheet is a summary. It may not cover all possible information. If you have questions about this medicine, talk to your doctor, pharmacist, or health care provider.  2020 Elsevier/Gold Standard (2017-06-13 09:33:38) Bevacizumab injection What is this medicine? BEVACIZUMAB (be va SIZ yoo mab) is a monoclonal antibody. It is used to treat many types of cancer. This medicine may be used for other purposes; ask your health care provider or pharmacist if you have questions. COMMON BRAND NAME(S): Avastin, MVASI, Zirabev What should I tell my health care provider before I take this medicine? They need to know if you have any of these conditions:  diabetes  heart disease  high blood pressure  history of coughing up blood  prior anthracycline chemotherapy (e.g., doxorubicin, daunorubicin, epirubicin)  recent or ongoing radiation therapy  recent or planning to have surgery  stroke  an unusual or allergic reaction to bevacizumab, hamster proteins, mouse proteins, other medicines, foods, dyes, or preservatives  pregnant or trying to get pregnant  breast-feeding How should I use this medicine? This medicine is for infusion into a vein. It is given by a health care professional in a hospital or clinic setting. Talk to your pediatrician regarding the use of this medicine in children. Special care may be needed. Overdosage: If you think you have taken too much of this medicine contact a poison control center or emergency room at once. NOTE: This medicine is only for you. Do not share this medicine with others. What if I miss a dose? It is important not to miss your dose. Call your doctor or health care professional if you are unable to keep an appointment. What may interact with this medicine? Interactions are  not expected. This list may not describe all possible interactions. Give your health care provider a list of all the medicines, herbs, non-prescription drugs, or dietary supplements you use. Also tell them if you smoke, drink alcohol, or use illegal drugs. Some items may interact with your medicine. What should I watch for while using this medicine? Your condition will be monitored carefully while you are receiving this medicine. You will need important blood work and urine testing done while you are taking this medicine. This medicine may increase your risk to bruise or bleed. Call your doctor or health care professional if you notice any unusual bleeding. This medicine should be started at least 28 days following major surgery and the site of the surgery should be totally healed. Check with your doctor before scheduling dental work or surgery while you are receiving this treatment. Talk to your doctor if you have recently had surgery or if you have a wound that has not healed. Do not become pregnant while taking this medicine or for 6 months after stopping it. Women should inform their doctor if they wish to become pregnant or think they might be pregnant. There is a potential for serious side effects to an unborn child. Talk to your health care professional or pharmacist for more information. Do not breast-feed  an infant while taking this medicine and for 6 months after the last dose. This medicine has caused ovarian failure in some women. This medicine may interfere with the ability to have a child. You should talk to your doctor or health care professional if you are concerned about your fertility. What side effects may I notice from receiving this medicine? Side effects that you should report to your doctor or health care professional as soon as possible:  allergic reactions like skin rash, itching or hives, swelling of the face, lips, or tongue  chest pain or chest tightness  chills  coughing  up blood  high fever  seizures  severe constipation  signs and symptoms of bleeding such as bloody or black, tarry stools; red or dark-brown urine; spitting up blood or brown material that looks like coffee grounds; red spots on the skin; unusual bruising or bleeding from the eye, gums, or nose  signs and symptoms of a blood clot such as breathing problems; chest pain; severe, sudden headache; pain, swelling, warmth in the leg  signs and symptoms of a stroke like changes in vision; confusion; trouble speaking or understanding; severe headaches; sudden numbness or weakness of the face, arm or leg; trouble walking; dizziness; loss of balance or coordination  stomach pain  sweating  swelling of legs or ankles  vomiting  weight gain Side effects that usually do not require medical attention (report to your doctor or health care professional if they continue or are bothersome):  back pain  changes in taste  decreased appetite  dry skin  nausea  tiredness This list may not describe all possible side effects. Call your doctor for medical advice about side effects. You may report side effects to FDA at 1-800-FDA-1088. Where should I keep my medicine? This drug is given in a hospital or clinic and will not be stored at home. NOTE: This sheet is a summary. It may not cover all possible information. If you have questions about this medicine, talk to your doctor, pharmacist, or health care provider.  2020 Elsevier/Gold Standard (2016-03-08 14:33:29)

## 2018-10-20 NOTE — Addendum Note (Signed)
Addended by: Burney Gauze R on: 10/20/2018 01:10 PM   Modules accepted: Orders

## 2018-10-20 NOTE — Progress Notes (Signed)
Hematology and Oncology Follow Up Visit  SENOVIA GAUER [id number] [birth year] 78 y.o. 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 Nowakowski is here today for follow-up and treatment. She tolerated her first cycle nicely and has no complaints at this time.  AFP earlier this month was 347.  No fever, chills, n/v, cough, rash, dizziness, SOB, chest pain, palpitations, abdominal pain or changes in bowel or bladder habits.  She has some puffiness in both ankles that comes and goes.  No numbness or tingling in her extremities.  She is eating well and staying hydrated. Her weight is stable.  She has not noted any bleeding, no bruising or petechiae.   ECOG Performance Status: 1 - Symptomatic but completely ambulatory  Medications:  Allergies as of 10/20/2018      Reactions   Metformin And Related Other (See Comments)   Unknown   Welchol [colesevelam Hcl] Other (See Comments)   Unknown      Medication List       Accurate as of October 20, 2018 12:48 PM. If you have any questions, ask your nurse or doctor.        cyanocobalamin 1000 MCG tablet Take 1,000 mcg by mouth daily.   glipiZIDE 5 MG tablet Commonly known as: GLUCOTROL Take 1 tablet 3 x /day with meals for Diabetes   Januvia 100 MG tablet Generic drug: sitaGLIPtin Take 100 mg by mouth daily.   lactulose 10 GM/15ML solution Commonly known as: CHRONULAC Take 30 mLs (20 g total) by mouth 3 (three) times daily. What changed:   how much to take  when to take this  additional instructions   levothyroxine 112 MCG tablet Commonly known as: Synthroid Take 1 tablet daily on an empty  stomach with only water for 30 minutes & no Antacid meds, Calcium or Magnesium for 4 hours & avoid Biotin   lidocaine-prilocaine cream Commonly known as: EMLA Apply to affected area once   magnesium oxide 400 MG tablet Commonly known as: MAG-OX Take 400 mg by mouth daily.   metFORMIN 500 MG tablet Commonly known as: Glucophage Take 1 tablet with Breakfast & Lunch and 2 tablets with supper for Diabetes   ondansetron 8 MG tablet Commonly known as: Zofran Take 1 tablet (8 mg total) by mouth 2 (two) times daily as needed (Nausea or vomiting).   OneTouch Ultra test strip Generic drug: glucose blood USE 1 STRIP TO CHECK GLUCOSE ONCE DAILY   prochlorperazine 10 MG tablet Commonly known as: COMPAZINE Take 1 tablet (10 mg total) by mouth every 6 (six) hours as needed (Nausea or vomiting).   Vitamin D3 125 MCG (5000 UT) Caps Take 5,000 Units by mouth daily.       Allergies:  Allergies  Allergen Reactions  . Metformin And Related Other (See Comments)    Unknown  . Welchol [Colesevelam Hcl] Other (See Comments)    Unknown    Past Medical History, Surgical history, Social history, and Family History were reviewed and updated.  Review of Systems: All other 10 point review of systems is negative.   Physical Exam:  height is 5' 2"  (1.575 m) and weight is 143 lb 3.2 oz (65 kg). Her temporal temperature is 98  F (36.7 C). Her blood pressure is 116/52 (abnormal) and her pulse is 77. Her respiration is 19 and oxygen saturation is 99%.   Wt Readings from Last 3 Encounters:  10/20/18 143 lb 3.2 oz (65 kg)  09/29/18 145 lb 1.3 oz (65.8 kg)  09/16/18 144 lb (65.3 kg)    Ocular: Sclerae unicteric, pupils equal, round and reactive to light Ear-nose-throat: Oropharynx clear, dentition fair Lymphatic: No cervical or supraclavicular adenopathy Lungs no rales or rhonchi, good excursion bilaterally Heart regular rate and rhythm, no murmur appreciated Abd soft, nontender, positive bowel  sounds, no liver or spleen tip palpated on exam, no fluid wave  MSK no focal spinal tenderness, no joint edema Neuro: non-focal, well-oriented, appropriate affect Breasts: Deferred   Lab Results  Component Value Date   WBC 1.9 (L) 10/20/2018   HGB 9.7 (L) 10/20/2018   HCT 29.2 (L) 10/20/2018   MCV 105.8 (H) 10/20/2018   PLT 73 (L) 10/20/2018   Lab Results  Component Value Date   FERRITIN 119 03/23/2016   IRON 36 03/23/2016   TIBC 234 (L) 03/23/2016   UIBC 198 03/23/2016   IRONPCTSAT 15 03/23/2016   Lab Results  Component Value Date   RETICCTPCT 1.2 09/05/2015   RBC 2.76 (L) 10/20/2018   RETICCTABS 40,080 09/05/2015   No results found for: KPAFRELGTCHN, LAMBDASER, KAPLAMBRATIO No results found for: Kandis Cocking, [facility] Lab Results  Component Value Date   TOTALPROTELP 7.1 02/03/2008   ALBUMINELP 54.8 (L) 02/03/2008   A1GS 4.2 02/03/2008   A2GS 9.5 02/03/2008   BETS 8.1 (H) 02/03/2008   BETA2SER 5.8 02/03/2008   GAMS 17.6 02/03/2008   MSPIKE NOT DET 02/03/2008   SPEI * 02/03/2008     Chemistry      Component Value Date/Time   NA 139 09/29/2018 1120   NA 139 01/01/2017 1034   K 4.0 09/29/2018 1120   K 4.1 01/01/2017 1034   CL 107 09/29/2018 1120   CL 107 01/01/2017 1034   CO2 24 09/29/2018 1120   CO2 29 01/01/2017 1034   BUN 11 09/29/2018 1120   BUN 9 01/01/2017 1034   CREATININE 0.71 09/29/2018 1120   CREATININE 0.62 07/29/2018 1116      Component Value Date/Time   CALCIUM 8.5 (L) 09/29/2018 1120   CALCIUM 9.4 01/01/2017 1034   ALKPHOS 121 09/29/2018 1120   ALKPHOS 81 01/01/2017 1034   AST 58 (H) 09/29/2018 1120   ALT 37 09/29/2018 1120   ALT 48 (H) 01/01/2017 1034   BILITOT 1.3 (H) 09/29/2018 1120       Impression and Plan: Ms. 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 We will proceed with cycle 2 today as planned.  Her blood sugar was high at 327. She was due for her Metformin  and Glipizide so she took here. We also gave 10 units of insulin per Dr. Marin Olp.  We will plan to see her back in another 3 weeks.  We will repeat scans after cycle 4.  She will contact our office with any questions or concerns. We can certainly see her sooner if needed.   Laverna Peace, NP 7/28/202012:48 PM

## 2018-10-21 LAB — TSH: TSH: 0.213 u[IU]/mL — ABNORMAL LOW (ref 0.308–3.960)

## 2018-10-21 LAB — T4: T4, Total: 10.1 ug/dL (ref 4.5–12.0)

## 2018-11-02 ENCOUNTER — Telehealth: Payer: Self-pay | Admitting: Adult Health

## 2018-11-02 NOTE — Telephone Encounter (Signed)
Patient Wants to know SHOULD SHE KEEP OV DURING ONC

## 2018-11-02 NOTE — Progress Notes (Signed)
3 MONTH FOLLOW UP  Assessment:    Essential hypertension Continue medications Monitor blood pressure at home; call if consistently over 130/80 Continue DASH diet.   Reminder to go to the ER if any CP, SOB, nausea, dizziness, severe HA, changes vision/speech, left arm numbness and tingling and jaw pain.  Hepatocellular carcinoma ([place]) Followed by Dr. Marin Olp and Dr. Benson Norway  Type 2 diabetes mellitus with stage 2 chronic kidney disease, without long-term current use of insulin ([place]) Signs of hypoglycemia reviewed; continue to monitor closely  Education: Reviewed 'ABCs' of diabetes management (respective goals in parentheses):  A1C (<7), blood pressure (<130/80), and cholesterol (LDL <70) Eye Exam yearly and Dental Exam every 6 months. Dietary recommendations Physical Activity recommendations  Hypothyroidism, unspecified type continue medications the same pending lab results reminded to take on an empty stomach 30-68mns before food.  TSH monitored via cancer center - defer to them  [place] Encephalopathy ([place] Continue with lactulose  Pancytopenia ([facility]) CBC, followed by Dr. EMarin Olp Vitamin D deficiency At goal at recent check; continue to recommend supplementation for goal of 70-100 Defer vitamin D level  Medication management CBC, CMP/GFR reviewed from recent, check magnesium   Hyperlipidemia, unspecified hyperlipidemia type Not on meds secondary to liver disease Continue low cholesterol diet and exercise.  Check lipid panel.   Hepatoma ([place] Continue follow up  Pancytopenia ([place] Monitored via cancer center, recent CBC, defer   Will have her ask cancer center if they would draw lipid panel and A1C q365mo limit unnecessary sticks  Over 40 minutes of exam, counseling, chart review and critical decision making was performed Future Appointments  Date Time Provider [place]8/19/2020 11:00 AM [facility] LAB [facility] None  11/11/2018 11:15 AM [facility] INJ  NURSE [facility] None  11/11/2018 11:30 AM [place], PeRudell CobbMD [facility] None  11/11/2018 12:30 PM [facility] A3 [facility] None  12/01/2018 10:15 AM [facility] LAB [facility] None  12/01/2018 10:30 AM [facility] INJ NURSE [facility] None  12/01/2018 10:45 AM [place], SaHolli HumblesNP [facility] None  12/01/2018 11:15 AM [facility] B1 [facility] None  12/22/2018 10:00 AM [facility] LAB [facility] None  12/22/2018 10:15 AM [facility] INJ NURSE [facility] None  12/22/2018 10:45 AM [place], SaHolli HumblesNP [facility] None  12/22/2018 11:15 AM [facility] B1 [facility] None  01/12/2019 11:15 AM [facility] LAB [facility] None  01/12/2019 11:30 AM [facility] INJ NURSE [facility] None  01/12/2019 11:45 AM [place], SaHolli HumblesNP [facility] None  01/12/2019 12:30 PM [facility] A2 [facility] None  02/10/2019  3:30 PM McUnk PintoMD [facility] None  05/19/2019 11:15 AM CoVicie MuttersPA-C [facility] None  09/02/2019 10:00 AM McUnk PintoMD [facility] None     Plan:   During the course of the visit the patient was educated and counseled about appropriate screening and preventive services including:    Pneumococcal vaccine   Prevnar 13  Influenza vaccine  Td vaccine  Screening electrocardiogram  Bone densitometry screening  Colorectal cancer screening  Diabetes screening  Glaucoma screening  Nutrition counseling   Advanced directives: requested   Subjective:  Beverly Davis a [age].o. female who presents for 3 month follow up. Patient has remote hx/o Rt Breast cancer in 2008. 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 [facility] Nov 2017 [facility], she had Radio-embolization of a Rt Hepatic Lobe HCC, then followed in March 2018 with a thermal  microwave ablation. She has history of asymptomatic portal vein hypertension.  Recent increase in AFP prompted an MRI liver that demonstrated reoccurence, she is s/p  CT-guided tissue ablation of  right liver on 01/21/2018, found to be progressive on follow up and has initiated on chemotherapy after placement of port. She did have recent EGD by Dr. Benson Norway which fortunately did not show any large varices. She follows with Dr. Earleen Newport and Marin Olp.   BMI is Body mass index is 25.97 kg/m., she has not been working on diet and exercise. Wt Readings from Last 3 Encounters:  11/04/18 142 lb (64.4 kg)  10/20/18 143 lb 3.2 oz (65 kg)  09/29/18 145 lb 1.3 oz (65.8 kg)   Today their BP is BP: 122/62 She does not workout. She denies chest pain, shortness of breath, dizziness.   She is not on cholesterol medication due to her liver disease. Her cholesterol is not at goal. The cholesterol last visit was:   Lab Results  Component Value Date   CHOL 191 07/29/2018   HDL 64 07/29/2018   LDLCALC 109 (H) 07/29/2018   TRIG 88 07/29/2018   CHOLHDL 3.0 07/29/2018   . She has been working on diet for T2 diabetes on metformin, januvia, glipizide 5 mg TID, and denies foot ulcerations, increased appetite, nausea, paresthesia of the feet, polydipsia, polyuria, visual disturbances, vomiting and weight loss. She does check fasting blood sugars and presents with log today; ranges 85-138. Denies any episodes of hypoglycemia. Last A1C in the office was:  Lab Results  Component Value Date   HGBA1C 7.0 (H) 07/29/2018   Last GFR: Lab Results  Component Value Date   GFRNONAA >60 10/20/2018   Patient is on Vitamin D supplement.   Lab Results  Component Value Date   VD25OH 76 07/29/2018     She is on thyroid medication. Her medication was not changed last visit, she is taking 112 mcg daily, levels checked via cancer center, will defer monitoring and dose adjustment to them.  Lab Results  Component Value Date   TSH 0.213 (L) 10/20/2018  .   Medication Review: Current Outpatient Medications on File Prior to Visit  Medication Sig Dispense Refill  . Cholecalciferol (VITAMIN D3) 5000 units CAPS Take 5,000 Units  by mouth daily.    . cyanocobalamin 1000 MCG tablet Take 1,000 mcg by mouth daily.    Marland Kitchen glipiZIDE (GLUCOTROL) 5 MG tablet Take 1 tablet 3 x /day with meals for Diabetes 270 tablet 3  . lactulose (CHRONULAC) 10 GM/15ML solution Take 30 mLs (20 g total) by mouth 3 (three) times daily. (Patient taking differently: Take by mouth daily. Takes 20 mls. Daily.) 240 mL 3  . levothyroxine (SYNTHROID) 112 MCG tablet Take 1 tablet daily on an empty stomach with only water for 30 minutes & no Antacid meds, Calcium or Magnesium for 4 hours & avoid Biotin 90 tablet 3  . magnesium oxide (MAG-OX) 400 MG tablet Take 400 mg by mouth daily.     . metFORMIN (GLUCOPHAGE) 500 MG tablet Take 1 tablet with Breakfast & Lunch and 2 tablets with supper for Diabetes 360 tablet 3  . ONETOUCH ULTRA test strip USE 1 STRIP TO CHECK GLUCOSE ONCE DAILY    . sitaGLIPtin (JANUVIA) 100 MG tablet Take 100 mg by mouth daily.    Marland Kitchen lidocaine-prilocaine (EMLA) cream Apply to affected area once (Patient not taking: Reported on 11/04/2018) 30 g 3  . ondansetron (ZOFRAN) 8 MG tablet Take 1 tablet (8  mg total) by mouth 2 (two) times daily as needed (Nausea or vomiting). (Patient not taking: Reported on 11/04/2018) 30 tablet 1  . prochlorperazine (COMPAZINE) 10 MG tablet Take 1 tablet (10 mg total) by mouth every 6 (six) hours as needed (Nausea or vomiting). (Patient not taking: Reported on 11/04/2018) 30 tablet 1   No current facility-administered medications on file prior to visit.     Allergies  Allergen Reactions  . Metformin And Related Other (See Comments)    Unknown  . Welchol [Colesevelam Hcl] Other (See Comments)    Unknown    Current Problems (verified) Patient Active Problem List   Diagnosis Date Noted  . Goals of care, counseling/discussion 09/17/2018  . History of hepatocellular carcinoma 07/29/2018  . Liver cirrhosis secondary to NASH ([place]) 07/29/2018  . FHx: heart disease 07/29/2018  . B12 deficiency 07/29/2018  .  Pulmonary nodule 04/08/2018  . Hepatoma ([place]) 01/21/2018  . Poor compliance 11/26/2017  . Hepatocellular carcinoma ([place]) 05/08/2016  . Pancytopenia ([place])   . Hepatic encephalopathy ([place]) 03/18/2016  . Hyperlipidemia associated with type 2 diabetes mellitus ([place]) 12/14/2014  . Hypothyroidism 05/18/2014  . Essential hypertension 05/18/2014  . Vitamin D deficiency 05/18/2014  . Encounter for general adult medical examination with abnormal findings 05/18/2014  . Type 2 diabetes mellitus with stage 2 chronic kidney disease, without long-term current use of insulin ([place]) 01/12/2014  . Osteoporosis   . History of right breast cancer 09/30/2012    SURGICAL HISTORY She  has a past surgical history that includes Breast lumpectomy with axillary lymph node biopsy (Right, 06/25/2006); Cholecystectomy; Abdominal hysterectomy; Tubal ligation (1978); ir generic historical (03/28/2016); ir generic historical (05/01/2016); ir generic historical (05/01/2016); ir generic historical (05/01/2016); ir generic historical (05/01/2016); ir generic historical (05/01/2016); ir generic historical (05/01/2016); ir generic historical (05/01/2016); ir generic historical (05/01/2016); ir generic historical (05/01/2016); Radiofrequency ablation (N/A, 06/14/2016); IR Radiologist Eval & Mgmt (07/31/2016); IR Radiologist Eval & Mgmt (12/25/2016); Breast lumpectomy (Right, 2008); IR Radiologist Eval & Mgmt (12/10/2017); Radiology with anesthesia (Right, 01/21/2018); IR Radiologist Eval & Mgmt (04/21/2018); IR Radiologist Eval & Mgmt (08/14/2018); and IR IMAGING GUIDED PORT INSERTION (09/28/2018).   FAMILY HISTORY Her family history includes Cancer in her father, mother, and paternal grandfather; Diabetes in her maternal aunt, sister, and sister; Heart Problems in her maternal grandfather; Hypertension in her sister; Liver disease in her brother.   SOCIAL HISTORY She  reports that she has never smoked. She has never used smokeless tobacco. She reports that she  does not drink alcohol or use drugs.   Review of Systems  Constitutional: Negative for malaise/fatigue and weight loss.  HENT: Negative for hearing loss and tinnitus.   Eyes: Negative for blurred vision and double vision.  Respiratory: Negative for cough, sputum production, shortness of breath and wheezing.   Cardiovascular: Negative for chest pain, palpitations, orthopnea, claudication, leg swelling and PND.  Gastrointestinal: Positive for diarrhea (occ with medication). Negative for abdominal pain, blood in stool, constipation, heartburn, melena, nausea and vomiting.  Genitourinary: Negative.   Musculoskeletal: Negative for falls, joint pain and myalgias.  Skin: Negative for rash.  Neurological: Negative for dizziness, tingling, sensory change, weakness and headaches.  Endo/Heme/Allergies: Negative for polydipsia.  Psychiatric/Behavioral: Negative.  Negative for depression, memory loss, substance abuse and suicidal ideas. The patient is not nervous/anxious and does not have insomnia.   All other systems reviewed and are negative.    Objective:     Today's Vitals   11/04/18 1430  BP: 122/62  Pulse: 79  Temp: (!) 97.5 F (36.4 C)  SpO2: 98%  Weight: 142 lb (64.4 kg)   Body mass index is 25.97 kg/m.  General appearance: alert, no distress, WD/WN, female HEENT: normocephalic, sclerae anicteric, TMs pearly, nares patent, no discharge or erythema, pharynx normal Oral cavity: MMM, no lesions Neck: supple, no lymphadenopathy, no thyromegaly, no masses Heart: RRR, normal S1, S2, 5-6/6 holosystolic murmur Lungs: CTA bilaterally, no wheezes, rhonchi, or rales Abdomen: +bs, soft, non tender, non distended, no masses, no hepatomegaly, no splenomegaly Musculoskeletal: nontender, no swelling, no obvious deformity Extremities: no edema, no cyanosis, no clubbing Pulses: 2+ symmetric, upper and lower extremities, normal cap refill Neurological: alert, oriented x 3, CN2-12 intact, strength  normal upper extremities and lower extremities, sensation normal throughout, DTRs 2+ throughout, no cerebellar signs, gait normal. Slow cognition consistent with baseline Psychiatric: normal affect, behavior normal, pleasant     Izora Ribas, NP   11/04/2018

## 2018-11-02 NOTE — Telephone Encounter (Signed)
Patient did not want televisit- wants to wait until after she finishes current treatment with oncology before coming into office. She states she will call back and reschedule.

## 2018-11-04 ENCOUNTER — Ambulatory Visit (INDEPENDENT_AMBULATORY_CARE_PROVIDER_SITE_OTHER): Payer: PPO | Admitting: Adult Health

## 2018-11-04 ENCOUNTER — Other Ambulatory Visit: Payer: Self-pay

## 2018-11-04 ENCOUNTER — Encounter: Payer: Self-pay | Admitting: Adult Health

## 2018-11-04 VITALS — BP 122/62 | HR 79 | Temp 97.5°F | Wt 142.0 lb

## 2018-11-04 DIAGNOSIS — E785 Hyperlipidemia, unspecified: Secondary | ICD-10-CM

## 2018-11-04 DIAGNOSIS — E559 Vitamin D deficiency, unspecified: Secondary | ICD-10-CM

## 2018-11-04 DIAGNOSIS — N182 Chronic kidney disease, stage 2 (mild): Secondary | ICD-10-CM | POA: Diagnosis not present

## 2018-11-04 DIAGNOSIS — E039 Hypothyroidism, unspecified: Secondary | ICD-10-CM | POA: Diagnosis not present

## 2018-11-04 DIAGNOSIS — C220 Liver cell carcinoma: Secondary | ICD-10-CM | POA: Diagnosis not present

## 2018-11-04 DIAGNOSIS — K7581 Nonalcoholic steatohepatitis (NASH): Secondary | ICD-10-CM

## 2018-11-04 DIAGNOSIS — E1169 Type 2 diabetes mellitus with other specified complication: Secondary | ICD-10-CM | POA: Diagnosis not present

## 2018-11-04 DIAGNOSIS — K7460 Unspecified cirrhosis of liver: Secondary | ICD-10-CM | POA: Diagnosis not present

## 2018-11-04 DIAGNOSIS — D61818 Other pancytopenia: Secondary | ICD-10-CM | POA: Diagnosis not present

## 2018-11-04 DIAGNOSIS — I10 Essential (primary) hypertension: Secondary | ICD-10-CM

## 2018-11-04 DIAGNOSIS — K7469 Other cirrhosis of liver: Secondary | ICD-10-CM

## 2018-11-04 DIAGNOSIS — E1122 Type 2 diabetes mellitus with diabetic chronic kidney disease: Secondary | ICD-10-CM

## 2018-11-04 NOTE — Patient Instructions (Addendum)
Watch closely for any signs of low blood sugar (information attached below)  Ask cancer center provider if they would draw Hemoglobin A1C and lipid panel every 3 months - then we won't have to stick you at our visits   Preventing High Cholesterol Cholesterol is a white, waxy substance similar to fat that the human body needs to help build cells. The liver makes all the cholesterol that a person's body needs. Having high cholesterol (hypercholesterolemia) increases a person's risk for heart disease and stroke. Extra (excess) cholesterol comes from the food the person eats. High cholesterol can often be prevented with diet and lifestyle changes. If you already have high cholesterol, you can control it with diet and lifestyle changes and with medicine. How can high cholesterol affect me? If you have high cholesterol, deposits (plaques) may build up on the walls of your arteries. The arteries are the blood vessels that carry blood away from your heart. Plaques make the arteries narrower and stiffer. This can limit or block blood flow and cause blood clots to form. Blood clots:  Are tiny balls of cells that form in your blood.  Can move to the heart or brain, causing a heart attack or stroke. Plaques in arteries greatly increase your risk for heart attack and stroke.Making diet and lifestyle changes can reduce your risk for these conditions that may threaten your life. What can increase my risk? This condition is more likely to develop in people who:  Eat foods that are high in saturated fat or cholesterol. Saturated fat is mostly found in: ? Foods that contain animal fat, such as red meat and some dairy products. ? Certain fatty foods made from plants, such as tropical oils.  Are overweight.  Are not getting enough exercise.  Have a family history of high cholesterol. What actions can I take to prevent this? Nutrition   Eat less saturated fat.  Avoid trans fats (partially hydrogenated  oils). These are often found in margarine and in some baked goods, fried foods, and snacks bought in packages.  Avoid precooked or cured meat, such as sausages or meat loaves.  Avoid foods and drinks that have added sugars.  Eat more fruits, vegetables, and whole grains.  Choose healthy sources of protein, such as fish, poultry, lean cuts of red meat, beans, peas, lentils, and nuts.  Choose healthy sources of fat, such as: ? Nuts. ? Vegetable oils, especially olive oil. ? Fish that have healthy fats (omega-3 fatty acids), such as mackerel or salmon. The items listed above may not be a complete list of recommended foods and beverages. Contact a dietitian for more information. Lifestyle  Lose weight if you are overweight. Losing 5-10 lb (2.3-4.5 kg) can help prevent or control high cholesterol. It can also lower your risk for diabetes and high blood pressure. Ask your health care provider to help you with a diet and exercise plan to lose weight safely.  Do not use any products that contain nicotine or tobacco, such as cigarettes, e-cigarettes, and chewing tobacco. If you need help quitting, ask your health care provider.  Limit your alcohol intake. ? Do not drink alcohol if:  Your health care provider tells you not to drink.  You are pregnant, may be pregnant, or are planning to become pregnant. ? If you drink alcohol:  Limit how much you use to:  0-1 drink a day for women.  0-2 drinks a day for men.  Be aware of how much alcohol is in your drink.  In the [place] one drink equals one 12 oz bottle of beer (355 mL), one 5 oz glass of wine (148 mL), or one 1 oz glass of hard liquor (44 mL). Activity   Get enough exercise. Each week, do at least 150 minutes of exercise that takes a medium level of effort (moderate-intensity exercise). ? This is exercise that:  Makes your heart beat faster and makes you breathe harder than usual.  Allows you to still be able to talk. ? You could  exercise in short sessions several times a day or longer sessions a few times a week. For example, on 5 days each week, you could walk fast or ride your bike 3 times a day for 10 minutes each time.  Do exercises as told by your health care provider. Medicines  In addition to diet and lifestyle changes, your health care provider may recommend medicines to help lower cholesterol. This may be a medicine to lower the amount of cholesterol your liver makes. You may need medicine if: ? Diet and lifestyle changes do not lower your cholesterol enough. ? You have high cholesterol and other risk factors for heart disease or stroke.  Take over-the-counter and prescription medicines only as told by your health care provider. General information  Manage your risk factors for high cholesterol. Talk with your health care provider about all your risk factors and how to lower your risk.  Manage other conditions that you have, such as diabetes or high blood pressure (hypertension).  Have blood tests to check your cholesterol levels at regular points in time as told by your health care provider.  Keep all follow-up visits as told by your health care provider. This is important. Where to find more information  American Heart Association: www.heart.org  National Heart, Lung, and Blood Institute: https://wilson-eaton.com/ Summary  High cholesterol increases your risk for heart disease and stroke. By keeping your cholesterol level low, you can reduce your risk for these conditions.  High cholesterol can often be prevented with diet and lifestyle changes.  Work with your health care provider to manage your risk factors, and have your blood tested regularly. This information is not intended to replace advice given to you by your health care provider. Make sure you discuss any questions you have with your health care provider. Document Released: 03/26/2015 Document Revised: 07/03/2018 Document Reviewed: 11/18/2015  Elsevier Patient Education  2020 [place].       Hypoglycemia Hypoglycemia occurs when the level of sugar (glucose) in the blood is too low. Hypoglycemia can happen in people who do or do not have diabetes. It can develop quickly, and it can be a medical emergency. For most people with diabetes, a blood glucose level below 70 mg/dL (3.9 mmol/L) is considered hypoglycemia. Glucose is a type of sugar that provides the body's main source of energy. Certain hormones (insulin and glucagon) control the level of glucose in the blood. Insulin lowers blood glucose, and glucagon raises blood glucose. Hypoglycemia can result from having too much insulin in the bloodstream, or from not eating enough food that contains glucose. You may also have reactive hypoglycemia, which happens within 4 hours after eating a meal. What are the causes? Hypoglycemia occurs most often in people who have diabetes and may be caused by:  Diabetes medicine.  Not eating enough, or not eating often enough.  Increased physical activity.  Drinking alcohol on an empty stomach. If you do not have diabetes, hypoglycemia may be caused by:  A tumor  in the pancreas.  Not eating enough, or not eating for long periods at a time (fasting).  A severe infection or illness.  Certain medicines. What increases the risk? Hypoglycemia is more likely to develop in:  People who have diabetes and take medicines to lower blood glucose.  People who abuse alcohol.  People who have a severe illness. What are the signs or symptoms? Mild symptoms Mild hypoglycemia may not cause any symptoms. If you do have symptoms, they may include:  Hunger.  Anxiety.  Sweating and feeling clammy.  Dizziness or feeling light-headed.  Sleepiness.  Nausea.  Increased heart rate.  Headache.  Blurry vision.  Irritability.  Tingling or numbness around the mouth, lips, or tongue.  A change in coordination.  Restless sleep.  Moderate symptoms Moderate hypoglycemia can cause:  Mental confusion and poor judgment.  Behavior changes.  Weakness.  Irregular heartbeat. Severe symptoms Severe hypoglycemia is a medical emergency. It can cause:  Fainting.  Seizures.  Loss of consciousness (coma).  Death. How is this diagnosed? Hypoglycemia is diagnosed with a blood test to measure your blood glucose level. This blood test is done while you are having symptoms. Your health care provider may also do a physical exam and review your medical history. How is this treated? This condition can often be treated by immediately eating or drinking something that contains sugar, such as:  Fruit juice, 4-6 oz (120-150 mL).  Regular soda (not diet soda), 4-6 oz (120-150 mL).  Low-fat milk, 4 oz (120 mL).  Several pieces of hard candy.  Sugar or honey, 1 Tbsp (15 mL). Treating hypoglycemia if you have diabetes If you are alert and able to swallow safely, follow the 15:15 rule:  Take 15 grams of a rapid-acting carbohydrate. Talk with your health care provider about how much you should take.  Rapid-acting options include: ? Glucose pills (take 15 grams). ? 6-8 pieces of hard candy. ? 4-6 oz (120-150 mL) of fruit juice. ? 4-6 oz (120-150 mL) of regular (not diet) soda. ? 1 Tbsp (15 mL) honey or sugar.  Check your blood glucose 15 minutes after you take the carbohydrate.  If the repeat blood glucose level is still at or below 70 mg/dL (3.9 mmol/L), take 15 grams of a carbohydrate again.  If your blood glucose level does not increase above 70 mg/dL (3.9 mmol/L) after 3 tries, seek emergency medical care.  After your blood glucose level returns to normal, eat a meal or a snack within 1 hour.  Treating severe hypoglycemia Severe hypoglycemia is when your blood glucose level is at or below 54 mg/dL (3 mmol/L). Severe hypoglycemia is a medical emergency. Get medical help right away. If you have severe hypoglycemia  and you cannot eat or drink, you may need an injection of glucagon. A family member or close friend should learn how to check your blood glucose and how to give you a glucagon injection. Ask your health care provider if you need to have an emergency glucagon injection kit available. Severe hypoglycemia may need to be treated in a hospital. The treatment may include getting glucose through an IV. You may also need treatment for the cause of your hypoglycemia. Follow these instructions at home:  General instructions  Take over-the-counter and prescription medicines only as told by your health care provider.  Monitor your blood glucose as told by your health care provider.  Limit alcohol intake to no more than 1 drink a day for nonpregnant women and 2 drinks  a day for men. One drink equals 12 oz of beer (355 mL), 5 oz of wine (148 mL), or 1 oz of hard liquor (44 mL).  Keep all follow-up visits as told by your health care provider. This is important. If you have diabetes:  Always have a rapid-acting carbohydrate snack with you to treat low blood glucose.  Follow your diabetes management plan as directed. Make sure you: ? Know the symptoms of hypoglycemia. It is important to treat it right away to prevent it from becoming severe. ? Take your medicines as directed. ? Follow your exercise plan. ? Follow your meal plan. Eat on time, and do not skip meals. ? Check your blood glucose as often as directed. Always check before and after exercise. ? Follow your sick day plan whenever you cannot eat or drink normally. Make this plan in advance with your health care provider.  Share your diabetes management plan with people in your workplace, school, and household.  Check your urine for ketones when you are ill and as told by your health care provider.  Carry a medical alert card or wear medical alert jewelry. Contact a health care provider if:  You have problems keeping your blood glucose in your  target range.  You have frequent episodes of hypoglycemia. Get help right away if:  You continue to have hypoglycemia symptoms after eating or drinking something containing glucose.  Your blood glucose is at or below 54 mg/dL (3 mmol/L).  You have a seizure.  You faint. These symptoms may represent a serious problem that is an emergency. Do not wait to see if the symptoms will go away. Get medical help right away. Call your local emergency services (911 in the [place] Summary  Hypoglycemia occurs when the level of sugar (glucose) in the blood is too low.  Hypoglycemia can happen in people who do or do not have diabetes. It can develop quickly, and it can be a medical emergency.  Make sure you know the symptoms of hypoglycemia and how to treat it.  Always have a rapid-acting carbohydrate snack with you to treat low blood sugar. This information is not intended to replace advice given to you by your health care provider. Make sure you discuss any questions you have with your health care provider. Document Released: 03/11/2005 Document Revised: 09/01/2017 Document Reviewed: 04/14/2015 Elsevier Patient Education  2020 [employer].

## 2018-11-05 LAB — LIPID PANEL
Cholesterol: 193 mg/dL (ref <200)
HDL: 53 mg/dL (ref >=50)
LDL Cholesterol (Calc): 115 mg/dL (calc) — ABNORMAL HIGH
Non-HDL Cholesterol (Calc): 140 mg/dL (calc) — ABNORMAL HIGH (ref <130)
Total CHOL/HDL Ratio: 3.6 (calc) (ref <5.0)
Triglycerides: 139 mg/dL (ref <150)

## 2018-11-05 LAB — HEMOGLOBIN A1C
Hgb A1c MFr Bld: 6.2 % of total Hgb — ABNORMAL HIGH (ref <5.7)
Mean Plasma Glucose: 131 (calc)
eAG (mmol/L): 7.3 (calc)

## 2018-11-05 LAB — MAGNESIUM: Magnesium: 1.9 mg/dL (ref 1.5–2.5)

## 2018-11-05 NOTE — Progress Notes (Signed)
Mvasi and Noah Charon are not currently indicated for hepatocellular cancer.  Will leave Avastin for the present.

## 2018-11-11 ENCOUNTER — Other Ambulatory Visit: Payer: Self-pay

## 2018-11-11 ENCOUNTER — Inpatient Hospital Stay: Payer: PPO | Attending: Hematology & Oncology

## 2018-11-11 ENCOUNTER — Encounter: Payer: Self-pay | Admitting: Hematology & Oncology

## 2018-11-11 ENCOUNTER — Telehealth: Payer: Self-pay | Admitting: Hematology & Oncology

## 2018-11-11 ENCOUNTER — Other Ambulatory Visit: Payer: Self-pay | Admitting: Family

## 2018-11-11 ENCOUNTER — Inpatient Hospital Stay: Payer: PPO

## 2018-11-11 ENCOUNTER — Inpatient Hospital Stay (HOSPITAL_BASED_OUTPATIENT_CLINIC_OR_DEPARTMENT_OTHER): Payer: PPO | Admitting: Hematology & Oncology

## 2018-11-11 VITALS — BP 115/60 | HR 68 | Temp 97.7°F | Resp 18 | Wt 141.2 lb

## 2018-11-11 DIAGNOSIS — Z8505 Personal history of malignant neoplasm of liver: Secondary | ICD-10-CM

## 2018-11-11 DIAGNOSIS — Z79899 Other long term (current) drug therapy: Secondary | ICD-10-CM | POA: Diagnosis not present

## 2018-11-11 DIAGNOSIS — E1122 Type 2 diabetes mellitus with diabetic chronic kidney disease: Secondary | ICD-10-CM | POA: Diagnosis not present

## 2018-11-11 DIAGNOSIS — N182 Chronic kidney disease, stage 2 (mild): Secondary | ICD-10-CM | POA: Diagnosis not present

## 2018-11-11 DIAGNOSIS — K7460 Unspecified cirrhosis of liver: Secondary | ICD-10-CM | POA: Diagnosis not present

## 2018-11-11 DIAGNOSIS — E1365 Other specified diabetes mellitus with hyperglycemia: Secondary | ICD-10-CM | POA: Insufficient documentation

## 2018-11-11 DIAGNOSIS — Z5112 Encounter for antineoplastic immunotherapy: Secondary | ICD-10-CM | POA: Diagnosis not present

## 2018-11-11 DIAGNOSIS — C220 Liver cell carcinoma: Secondary | ICD-10-CM | POA: Diagnosis not present

## 2018-11-11 DIAGNOSIS — E1169 Type 2 diabetes mellitus with other specified complication: Secondary | ICD-10-CM

## 2018-11-11 DIAGNOSIS — E785 Hyperlipidemia, unspecified: Secondary | ICD-10-CM

## 2018-11-11 DIAGNOSIS — K7581 Nonalcoholic steatohepatitis (NASH): Secondary | ICD-10-CM | POA: Insufficient documentation

## 2018-11-11 LAB — CBC WITH DIFFERENTIAL (CANCER CENTER ONLY)
Abs Immature Granulocytes: 0 10*3/uL (ref 0.00–0.07)
Basophils Absolute: 0 10*3/uL (ref 0.0–0.1)
Basophils Relative: 1 %
Eosinophils Absolute: 0.1 10*3/uL (ref 0.0–0.5)
Eosinophils Relative: 3 %
HCT: 29.1 % — ABNORMAL LOW (ref 36.0–46.0)
Hemoglobin: 9.7 g/dL — ABNORMAL LOW (ref 12.0–15.0)
Immature Granulocytes: 0 %
Lymphocytes Relative: 20 %
Lymphs Abs: 0.4 10*3/uL — ABNORMAL LOW (ref 0.7–4.0)
MCH: 35.7 pg — ABNORMAL HIGH (ref 26.0–34.0)
MCHC: 33.3 g/dL (ref 30.0–36.0)
MCV: 107 fL — ABNORMAL HIGH (ref 80.0–100.0)
Monocytes Absolute: 0.3 10*3/uL (ref 0.1–1.0)
Monocytes Relative: 14 %
Neutro Abs: 1.3 10*3/uL — ABNORMAL LOW (ref 1.7–7.7)
Neutrophils Relative %: 62 %
Platelet Count: 77 10*3/uL — ABNORMAL LOW (ref 150–400)
RBC: 2.72 MIL/uL — ABNORMAL LOW (ref 3.87–5.11)
RDW: 15.3 % (ref 11.5–15.5)
WBC Count: 2.1 10*3/uL — ABNORMAL LOW (ref 4.0–10.5)
nRBC: 0 % (ref 0.0–0.2)

## 2018-11-11 LAB — CMP (CANCER CENTER ONLY)
ALT: 43 U/L (ref 0–44)
AST: 63 U/L — ABNORMAL HIGH (ref 15–41)
Albumin: 2.9 g/dL — ABNORMAL LOW (ref 3.5–5.0)
Alkaline Phosphatase: 117 U/L (ref 38–126)
Anion gap: 6 (ref 5–15)
BUN: 13 mg/dL (ref 8–23)
CO2: 21 mmol/L — ABNORMAL LOW (ref 22–32)
Calcium: 8.7 mg/dL — ABNORMAL LOW (ref 8.9–10.3)
Chloride: 109 mmol/L (ref 98–111)
Creatinine: 0.59 mg/dL (ref 0.44–1.00)
GFR, Est AFR Am: >60 mL/min (ref >60)
GFR, Estimated: >60 mL/min (ref >60)
Glucose, Bld: 274 mg/dL — ABNORMAL HIGH (ref 70–99)
Potassium: 4.1 mmol/L (ref 3.5–5.1)
Sodium: 136 mmol/L (ref 135–145)
Total Bilirubin: 1.4 mg/dL — ABNORMAL HIGH (ref 0.3–1.2)
Total Protein: 5.3 g/dL — ABNORMAL LOW (ref 6.5–8.1)

## 2018-11-11 MED ORDER — SODIUM CHLORIDE 0.9 % IV SOLN
1200.0000 mg | Freq: Once | INTRAVENOUS | Status: AC
  Administered 2018-11-11: 1200 mg via INTRAVENOUS
  Filled 2018-11-11: qty 20

## 2018-11-11 MED ORDER — SODIUM CHLORIDE 0.9 % IV SOLN
Freq: Once | INTRAVENOUS | Status: AC
  Administered 2018-11-11: 13:00:00 via INTRAVENOUS
  Filled 2018-11-11: qty 250

## 2018-11-11 MED ORDER — HEPARIN SOD (PORK) LOCK FLUSH 100 UNIT/ML IV SOLN
500.0000 [IU] | Freq: Once | INTRAVENOUS | Status: AC | PRN
  Administered 2018-11-11: 500 [IU]
  Filled 2018-11-11: qty 5

## 2018-11-11 MED ORDER — INSULIN REGULAR HUMAN 100 UNIT/ML IJ SOLN
10.0000 [IU] | Freq: Once | INTRAMUSCULAR | Status: AC
  Administered 2018-11-11: 10 [IU] via SUBCUTANEOUS

## 2018-11-11 MED ORDER — SODIUM CHLORIDE 0.9% FLUSH
10.0000 mL | INTRAVENOUS | Status: DC | PRN
  Administered 2018-11-11: 10 mL
  Filled 2018-11-11: qty 10

## 2018-11-11 MED ORDER — SODIUM CHLORIDE 0.9 % IV SOLN
15.3000 mg/kg | Freq: Once | INTRAVENOUS | Status: AC
  Administered 2018-11-11: 1000 mg via INTRAVENOUS
  Filled 2018-11-11: qty 32

## 2018-11-11 NOTE — Progress Notes (Signed)
OK to treat with today's lab values per Dr. Marin Olp.

## 2018-11-11 NOTE — Progress Notes (Signed)
Hematology and Oncology Follow Up Visit  Beverly Davis [id number] Dec 14, 1940 78 y.o. 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  Overall, she is doing pretty well.  Her dementia really is the biggest problem for her.  Her blood sugars are also a little bit of a problem.  Her sugar is 274 today.  As such, I will give her some insulin.  I will go ahead and give her 10 units of regular insulin.  She has had no diarrhea.  There is been no abdominal pain.  She has had no cough or shortness of breath.  There has been no leg swelling.  She has had no rashes.  Her last alpha-fetoprotein was 347.  Overall, her performance status is probably ECOG 1.     Allergies as of 11/11/2018      Reactions   Metformin And Related Other (See Comments)   Unknown   Welchol [colesevelam Hcl] Other (See Comments)   Unknown      Medication List       Accurate as of November 11, 2018 12:48 PM. If you have any questions, ask your nurse or doctor.        cyanocobalamin 1000 MCG tablet Take 1,000 mcg by mouth daily.   glipiZIDE 5 MG tablet Commonly known as: GLUCOTROL Take 1 tablet 3 x /day with meals for Diabetes   Januvia 100 MG tablet Generic drug: sitaGLIPtin Take 100 mg by mouth daily.   lactulose 10 GM/15ML solution Commonly known as: CHRONULAC Take 30 mLs (20 g total) by mouth 3 (three) times daily. What changed:   how much to take  when to take this  additional instructions   levothyroxine 112 MCG tablet Commonly known as: Synthroid Take 1 tablet daily on an empty stomach with  only water for 30 minutes & no Antacid meds, Calcium or Magnesium for 4 hours & avoid Biotin   lidocaine-prilocaine cream Commonly known as: EMLA Apply to affected area once   magnesium oxide 400 MG tablet Commonly known as: MAG-OX Take 400 mg by mouth daily.   metFORMIN 500 MG tablet Commonly known as: Glucophage Take 1 tablet with Breakfast & Lunch and 2 tablets with supper for Diabetes   ondansetron 8 MG tablet Commonly known as: Zofran Take 1 tablet (8 mg total) by mouth 2 (two) times daily as needed (Nausea or vomiting).   OneTouch Delica Plus [id number] Misc   OneTouch Ultra test strip Generic drug: glucose blood USE 1 STRIP TO CHECK GLUCOSE ONCE DAILY   prochlorperazine 10 MG tablet Commonly known as: COMPAZINE Take 1 tablet (10 mg total) by mouth every 6 (six) hours as needed (Nausea or vomiting).   Vitamin D3 125 MCG (5000 UT) Caps Take 5,000 Units by mouth daily.       Allergies:  Allergies  Allergen Reactions  . Metformin And Related Other (See Comments)    Unknown  . Welchol [Colesevelam Hcl] Other (See Comments)    Unknown    Past Medical History, Surgical history, Social history, and Family History were reviewed and updated.  Review of Systems: Review of Systems  Constitutional: Negative.   HENT:  Negative.   Eyes: Negative.   Respiratory: Negative.   Cardiovascular: Negative.   Gastrointestinal: Negative.   Genitourinary: Positive for frequency.  Musculoskeletal: Negative.   Skin: Negative.   Neurological: Negative.   Endo/Heme/Allergies: Negative.   Psychiatric/Behavioral: Positive for memory loss.      Physical Exam:  weight is 141 lb 4 oz (64.1 kg). Her temporal temperature is 97.7 F (36.5 C). Her blood pressure is 115/60 and her pulse is 68. Her respiration is 18 and oxygen saturation is 98%.   Wt Readings from Last 3 Encounters:  11/11/18 141 lb 4 oz (64.1 kg)  11/04/18 142 lb (64.4 kg)  10/20/18 143 lb 3.2 oz (65 kg)     Physical Exam Vitals signs reviewed.  HENT:     Head: Normocephalic and atraumatic.  Eyes:     Pupils: Pupils are equal, round, and reactive to light.  Neck:     Musculoskeletal: Normal range of motion.  Cardiovascular:     Rate and Rhythm: Normal rate and regular rhythm.     Heart sounds: Normal heart sounds.  Pulmonary:     Effort: Pulmonary effort is normal.     Breath sounds: Normal breath sounds.  Abdominal:     General: Bowel sounds are normal.     Palpations: Abdomen is soft.  Musculoskeletal: Normal range of motion.        General: No tenderness or deformity.  Lymphadenopathy:     Cervical: No cervical adenopathy.  Skin:    General: Skin is warm and dry.     Findings: No erythema or rash.  Neurological:     Mental Status: She is alert and oriented to person, place, and time.  Psychiatric:        Behavior: Behavior normal.        Thought Content: Thought content normal.        Judgment: Judgment normal.      Lab Results  Component Value Date   WBC 2.1 (L) 11/11/2018   HGB 9.7 (L) 11/11/2018   HCT 29.1 (L) 11/11/2018   MCV 107.0 (H) 11/11/2018   PLT 77 (L) 11/11/2018   Lab Results  Component Value Date   FERRITIN 119 03/23/2016   IRON 36 03/23/2016   TIBC 234 (L) 03/23/2016   UIBC 198 03/23/2016   IRONPCTSAT 15 03/23/2016   Lab Results  Component Value Date   RETICCTPCT 1.2 09/05/2015   RBC 2.72 (L) 11/11/2018   RETICCTABS 40,080 09/05/2015   No results found for: KPAFRELGTCHN, LAMBDASER, KAPLAMBRATIO No results found for: Kandis Cocking, [facility] Lab Results  Component Value Date   TOTALPROTELP 7.1 02/03/2008   ALBUMINELP 54.8 (L) 02/03/2008   A1GS 4.2 02/03/2008   A2GS 9.5 02/03/2008   BETS 8.1 (H) 02/03/2008   BETA2SER 5.8 02/03/2008   GAMS 17.6 02/03/2008   MSPIKE NOT DET 02/03/2008   SPEI * 02/03/2008     Chemistry      Component Value Date/Time   NA 136 11/11/2018 1115   NA 139 01/01/2017 1034   K 4.1 11/11/2018 1115   K 4.1  01/01/2017 1034   CL 109 11/11/2018 1115   CL 107 01/01/2017 1034   CO2 21 (L) 11/11/2018 1115   CO2 29 01/01/2017 1034   BUN 13 11/11/2018 1115   BUN 9 01/01/2017 1034   CREATININE 0.59 11/11/2018 1115   CREATININE 0.62 07/29/2018 1116      Component Value Date/Time   CALCIUM 8.7 (L) 11/11/2018 1115   CALCIUM 9.4  01/01/2017 1034   ALKPHOS 117 11/11/2018 1115   ALKPHOS 81 01/01/2017 1034   AST 63 (H) 11/11/2018 1115   ALT 43 11/11/2018 1115   ALT 48 (H) 01/01/2017 1034   BILITOT 1.4 (H) 11/11/2018 1115       Impression and Plan: Ms. 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  We will go ahead with her third cycle of treatment today.  I will be interesting to see what her alpha-fetoprotein is.  I just feel bad about her dementia.  This is a problem for her.  Her family has been doing pretty well with this.  Her husband is also helping out.  After her fourth cycle of treatment, we will repeat her scans.  We will have her come back in 3 weeks for her fourth cycle of treatment.  Of note, we will have to write down to her how we need to proceed with our scans after her fourth cycle of treatment.   [name], MD 8/19/202012:48 PM

## 2018-11-11 NOTE — Telephone Encounter (Signed)
Appointments scheduled already  8/19 los

## 2018-11-11 NOTE — Patient Instructions (Signed)
Atezolizumab injection What is this medicine? ATEZOLIZUMAB (a te zoe LIZ ue mab) is a monoclonal antibody. It is used to treat bladder cancer (urothelial cancer), non-small cell lung cancer, small cell lung cancer, and breast cancer. This medicine may be used for other purposes; ask your health care provider or pharmacist if you have questions. COMMON BRAND NAME(S): Tecentriq What should I tell my health care provider before I take this medicine? They need to know if you have any of these conditions:  diabetes  immune system problems  infection  inflammatory bowel disease  liver disease  lung or breathing disease  lupus  nervous system problems like myasthenia gravis or Guillain-Barre syndrome  organ transplant  an unusual or allergic reaction to atezolizumab, other medicines, foods, dyes, or preservatives  pregnant or trying to get pregnant  breast-feeding How should I use this medicine? This medicine is for infusion into a vein. It is given by a health care professional in a hospital or clinic setting. A special MedGuide will be given to you before each treatment. Be sure to read this information carefully each time. Talk to your pediatrician regarding the use of this medicine in children. Special care may be needed. Overdosage: If you think you have taken too much of this medicine contact a poison control center or emergency room at once. NOTE: This medicine is only for you. Do not share this medicine with others. What if I miss a dose? It is important not to miss your dose. Call your doctor or health care professional if you are unable to keep an appointment. What may interact with this medicine? Interactions have not been studied. This list may not describe all possible interactions. Give your health care provider a list of all the medicines, herbs, non-prescription drugs, or dietary supplements you use. Also tell them if you smoke, drink alcohol, or use illegal drugs.  Some items may interact with your medicine. What should I watch for while using this medicine? Your condition will be monitored carefully while you are receiving this medicine. You may need blood work done while you are taking this medicine. Do not become pregnant while taking this medicine or for at least 5 months after stopping it. Women should inform their doctor if they wish to become pregnant or think they might be pregnant. There is a potential for serious side effects to an unborn child. Talk to your health care professional or pharmacist for more information. Do not breast-feed an infant while taking this medicine or for at least 5 months after the last dose. What side effects may I notice from receiving this medicine? Side effects that you should report to your doctor or health care professional as soon as possible:  allergic reactions like skin rash, itching or hives, swelling of the face, lips, or tongue  black, tarry stools  bloody or watery diarrhea  breathing problems  changes in vision  chest pain or chest tightness  chills  facial flushing  fever  headache  signs and symptoms of high blood sugar such as dizziness; dry mouth; dry skin; fruity breath; nausea; stomach pain; increased hunger or thirst; increased urination  signs and symptoms of liver injury like dark yellow or brown urine; general ill feeling or flu-like symptoms; light-colored stools; loss of appetite; nausea; right upper belly pain; unusually weak or tired; yellowing of the eyes or skin  stomach pain  trouble passing urine or change in the amount of urine Side effects that usually do not require medical  attention (report to your doctor or health care professional if they continue or are bothersome):  cough  diarrhea  joint pain  muscle pain  muscle weakness  tiredness  weight loss This list may not describe all possible side effects. Call your doctor for medical advice about side  effects. You may report side effects to FDA at 1-800-FDA-1088. Where should I keep my medicine? This drug is given in a hospital or clinic and will not be stored at home. NOTE: This sheet is a summary. It may not cover all possible information. If you have questions about this medicine, talk to your doctor, pharmacist, or health care provider.  2020 Elsevier/Gold Standard (2017-06-13 09:33:38) Bevacizumab injection What is this medicine? BEVACIZUMAB (be va SIZ yoo mab) is a monoclonal antibody. It is used to treat many types of cancer. This medicine may be used for other purposes; ask your health care provider or pharmacist if you have questions. COMMON BRAND NAME(S): Avastin, MVASI, Zirabev What should I tell my health care provider before I take this medicine? They need to know if you have any of these conditions:  diabetes  heart disease  high blood pressure  history of coughing up blood  prior anthracycline chemotherapy (e.g., doxorubicin, daunorubicin, epirubicin)  recent or ongoing radiation therapy  recent or planning to have surgery  stroke  an unusual or allergic reaction to bevacizumab, hamster proteins, mouse proteins, other medicines, foods, dyes, or preservatives  pregnant or trying to get pregnant  breast-feeding How should I use this medicine? This medicine is for infusion into a vein. It is given by a health care professional in a hospital or clinic setting. Talk to your pediatrician regarding the use of this medicine in children. Special care may be needed. Overdosage: If you think you have taken too much of this medicine contact a poison control center or emergency room at once. NOTE: This medicine is only for you. Do not share this medicine with others. What if I miss a dose? It is important not to miss your dose. Call your doctor or health care professional if you are unable to keep an appointment. What may interact with this medicine? Interactions are  not expected. This list may not describe all possible interactions. Give your health care provider a list of all the medicines, herbs, non-prescription drugs, or dietary supplements you use. Also tell them if you smoke, drink alcohol, or use illegal drugs. Some items may interact with your medicine. What should I watch for while using this medicine? Your condition will be monitored carefully while you are receiving this medicine. You will need important blood work and urine testing done while you are taking this medicine. This medicine may increase your risk to bruise or bleed. Call your doctor or health care professional if you notice any unusual bleeding. This medicine should be started at least 28 days following major surgery and the site of the surgery should be totally healed. Check with your doctor before scheduling dental work or surgery while you are receiving this treatment. Talk to your doctor if you have recently had surgery or if you have a wound that has not healed. Do not become pregnant while taking this medicine or for 6 months after stopping it. Women should inform their doctor if they wish to become pregnant or think they might be pregnant. There is a potential for serious side effects to an unborn child. Talk to your health care professional or pharmacist for more information. Do not breast-feed  an infant while taking this medicine and for 6 months after the last dose. This medicine has caused ovarian failure in some women. This medicine may interfere with the ability to have a child. You should talk to your doctor or health care professional if you are concerned about your fertility. What side effects may I notice from receiving this medicine? Side effects that you should report to your doctor or health care professional as soon as possible:  allergic reactions like skin rash, itching or hives, swelling of the face, lips, or tongue  chest pain or chest tightness  chills  coughing  up blood  high fever  seizures  severe constipation  signs and symptoms of bleeding such as bloody or black, tarry stools; red or dark-brown urine; spitting up blood or brown material that looks like coffee grounds; red spots on the skin; unusual bruising or bleeding from the eye, gums, or nose  signs and symptoms of a blood clot such as breathing problems; chest pain; severe, sudden headache; pain, swelling, warmth in the leg  signs and symptoms of a stroke like changes in vision; confusion; trouble speaking or understanding; severe headaches; sudden numbness or weakness of the face, arm or leg; trouble walking; dizziness; loss of balance or coordination  stomach pain  sweating  swelling of legs or ankles  vomiting  weight gain Side effects that usually do not require medical attention (report to your doctor or health care professional if they continue or are bothersome):  back pain  changes in taste  decreased appetite  dry skin  nausea  tiredness This list may not describe all possible side effects. Call your doctor for medical advice about side effects. You may report side effects to FDA at 1-800-FDA-1088. Where should I keep my medicine? This drug is given in a hospital or clinic and will not be stored at home. NOTE: This sheet is a summary. It may not cover all possible information. If you have questions about this medicine, talk to your doctor, pharmacist, or health care provider.  2020 Elsevier/Gold Standard (2016-03-08 14:33:29)

## 2018-11-11 NOTE — Patient Instructions (Signed)

## 2018-11-12 ENCOUNTER — Telehealth: Payer: Self-pay | Admitting: *Deleted

## 2018-11-12 LAB — TSH: TSH: 0.163 u[IU]/mL — ABNORMAL LOW (ref 0.308–3.960)

## 2018-11-12 LAB — AFP TUMOR MARKER: AFP, Serum, Tumor Marker: 319 ng/mL — ABNORMAL HIGH (ref 0.0–8.3)

## 2018-11-12 LAB — T4: T4, Total: 10 ug/dL (ref 4.5–12.0)

## 2018-11-12 NOTE — Telephone Encounter (Signed)
Message received from patient requesting a call back from Dr. Marin Olp.  Call placed back to patient.  Patient would like to know how she will "know if the treatment she is receiving is up to par?"  Pt notified that Dr. Marin Olp will be doing a CT scan after her fourth cycle of chemo which will be some time in early October.  She would also like to know what her "cancer rate" is.  Pt notified per Dr. Marin Olp she is stage 4.  Pt appreciative of assistance and has no further questions at this time.

## 2018-11-16 ENCOUNTER — Telehealth: Payer: Self-pay | Admitting: Family

## 2018-11-16 NOTE — Telephone Encounter (Signed)
Called and spoke with patient regarding appointments for 9/8 due to Provider Pal being moved to 9/9.  She was ok with new date/time

## 2018-11-25 LAB — HM DIABETES EYE EXAM

## 2018-12-01 ENCOUNTER — Other Ambulatory Visit: Payer: PPO

## 2018-12-01 ENCOUNTER — Ambulatory Visit: Payer: PPO | Admitting: Family

## 2018-12-01 ENCOUNTER — Ambulatory Visit: Payer: PPO

## 2018-12-01 ENCOUNTER — Telehealth: Payer: Self-pay | Admitting: Hematology & Oncology

## 2018-12-01 NOTE — Telephone Encounter (Signed)
Called and LMVM in regards to appointment date.time change due to provider being out of office

## 2018-12-02 ENCOUNTER — Other Ambulatory Visit: Payer: Self-pay | Admitting: Internal Medicine

## 2018-12-02 ENCOUNTER — Ambulatory Visit: Payer: PPO | Admitting: Family

## 2018-12-02 ENCOUNTER — Ambulatory Visit: Payer: PPO

## 2018-12-02 ENCOUNTER — Other Ambulatory Visit: Payer: PPO

## 2018-12-02 DIAGNOSIS — E039 Hypothyroidism, unspecified: Secondary | ICD-10-CM

## 2018-12-02 MED ORDER — LEVOTHYROXINE SODIUM 112 MCG PO TABS: ORAL_TABLET | ORAL | 3 refills | Status: DC

## 2018-12-03 ENCOUNTER — Telehealth: Payer: Self-pay | Admitting: *Deleted

## 2018-12-03 NOTE — Telephone Encounter (Signed)
Call received from patient requesting a review of her scheduled appts for tomorrow.  Scheduled appts reviewed with pt.  Pt appreciative of assistance and has no further questions or concerns at this time.

## 2018-12-04 ENCOUNTER — Inpatient Hospital Stay: Payer: PPO | Attending: Hematology & Oncology

## 2018-12-04 ENCOUNTER — Encounter: Payer: Self-pay | Admitting: Family

## 2018-12-04 ENCOUNTER — Inpatient Hospital Stay: Payer: PPO

## 2018-12-04 ENCOUNTER — Other Ambulatory Visit: Payer: Self-pay

## 2018-12-04 ENCOUNTER — Telehealth: Payer: Self-pay | Admitting: Hematology & Oncology

## 2018-12-04 ENCOUNTER — Inpatient Hospital Stay (HOSPITAL_BASED_OUTPATIENT_CLINIC_OR_DEPARTMENT_OTHER): Payer: PPO | Admitting: Family

## 2018-12-04 VITALS — BP 126/50 | HR 78 | Temp 97.8°F | Resp 18 | Ht 62.0 in | Wt 143.0 lb

## 2018-12-04 DIAGNOSIS — Z853 Personal history of malignant neoplasm of breast: Secondary | ICD-10-CM | POA: Diagnosis not present

## 2018-12-04 DIAGNOSIS — K7460 Unspecified cirrhosis of liver: Secondary | ICD-10-CM | POA: Insufficient documentation

## 2018-12-04 DIAGNOSIS — Z79899 Other long term (current) drug therapy: Secondary | ICD-10-CM | POA: Insufficient documentation

## 2018-12-04 DIAGNOSIS — Z8505 Personal history of malignant neoplasm of liver: Secondary | ICD-10-CM

## 2018-12-04 DIAGNOSIS — C220 Liver cell carcinoma: Secondary | ICD-10-CM | POA: Diagnosis not present

## 2018-12-04 DIAGNOSIS — R600 Localized edema: Secondary | ICD-10-CM | POA: Insufficient documentation

## 2018-12-04 DIAGNOSIS — K7581 Nonalcoholic steatohepatitis (NASH): Secondary | ICD-10-CM | POA: Diagnosis not present

## 2018-12-04 DIAGNOSIS — E785 Hyperlipidemia, unspecified: Secondary | ICD-10-CM

## 2018-12-04 DIAGNOSIS — Z7984 Long term (current) use of oral hypoglycemic drugs: Secondary | ICD-10-CM | POA: Insufficient documentation

## 2018-12-04 DIAGNOSIS — E032 Hypothyroidism due to medicaments and other exogenous substances: Secondary | ICD-10-CM | POA: Diagnosis not present

## 2018-12-04 DIAGNOSIS — N182 Chronic kidney disease, stage 2 (mild): Secondary | ICD-10-CM

## 2018-12-04 DIAGNOSIS — Z5112 Encounter for antineoplastic immunotherapy: Secondary | ICD-10-CM | POA: Diagnosis not present

## 2018-12-04 DIAGNOSIS — E1122 Type 2 diabetes mellitus with diabetic chronic kidney disease: Secondary | ICD-10-CM

## 2018-12-04 DIAGNOSIS — E1169 Type 2 diabetes mellitus with other specified complication: Secondary | ICD-10-CM

## 2018-12-04 LAB — CMP (CANCER CENTER ONLY)
ALT: 42 U/L (ref 0–44)
AST: 62 U/L — ABNORMAL HIGH (ref 15–41)
Albumin: 3.1 g/dL — ABNORMAL LOW (ref 3.5–5.0)
Alkaline Phosphatase: 111 U/L (ref 38–126)
Anion gap: 9 (ref 5–15)
BUN: 10 mg/dL (ref 8–23)
CO2: 22 mmol/L (ref 22–32)
Calcium: 9 mg/dL (ref 8.9–10.3)
Chloride: 105 mmol/L (ref 98–111)
Creatinine: 0.59 mg/dL (ref 0.44–1.00)
GFR, Est AFR Am: >60 mL/min (ref >60)
GFR, Estimated: >60 mL/min (ref >60)
Glucose, Bld: 267 mg/dL — ABNORMAL HIGH (ref 70–99)
Potassium: 3.9 mmol/L (ref 3.5–5.1)
Sodium: 136 mmol/L (ref 135–145)
Total Bilirubin: 1.4 mg/dL — ABNORMAL HIGH (ref 0.3–1.2)
Total Protein: 5.9 g/dL — ABNORMAL LOW (ref 6.5–8.1)

## 2018-12-04 LAB — CBC WITH DIFFERENTIAL (CANCER CENTER ONLY)
Abs Immature Granulocytes: 0 10*3/uL (ref 0.00–0.07)
Basophils Absolute: 0 10*3/uL (ref 0.0–0.1)
Basophils Relative: 1 %
Eosinophils Absolute: 0.1 10*3/uL (ref 0.0–0.5)
Eosinophils Relative: 2 %
HCT: 31.1 % — ABNORMAL LOW (ref 36.0–46.0)
Hemoglobin: 10.4 g/dL — ABNORMAL LOW (ref 12.0–15.0)
Immature Granulocytes: 0 %
Lymphocytes Relative: 22 %
Lymphs Abs: 0.5 10*3/uL — ABNORMAL LOW (ref 0.7–4.0)
MCH: 35.7 pg — ABNORMAL HIGH (ref 26.0–34.0)
MCHC: 33.4 g/dL (ref 30.0–36.0)
MCV: 106.9 fL — ABNORMAL HIGH (ref 80.0–100.0)
Monocytes Absolute: 0.2 10*3/uL (ref 0.1–1.0)
Monocytes Relative: 10 %
Neutro Abs: 1.3 10*3/uL — ABNORMAL LOW (ref 1.7–7.7)
Neutrophils Relative %: 65 %
Platelet Count: 76 10*3/uL — ABNORMAL LOW (ref 150–400)
RBC: 2.91 MIL/uL — ABNORMAL LOW (ref 3.87–5.11)
RDW: 14.6 % (ref 11.5–15.5)
WBC Count: 2.1 10*3/uL — ABNORMAL LOW (ref 4.0–10.5)
nRBC: 0 % (ref 0.0–0.2)

## 2018-12-04 LAB — HEMOGLOBIN A1C
Hgb A1c MFr Bld: 6.3 % — ABNORMAL HIGH (ref 4.8–5.6)
Mean Plasma Glucose: 134.11 mg/dL

## 2018-12-04 LAB — LIPID PANEL
Cholesterol: 200 mg/dL (ref 0–200)
HDL: 61 mg/dL (ref >40)
LDL Cholesterol: 119 mg/dL — ABNORMAL HIGH (ref 0–99)
Total CHOL/HDL Ratio: 3.3 RATIO
Triglycerides: 100 mg/dL (ref <150)
VLDL: 20 mg/dL (ref 0–40)

## 2018-12-04 LAB — TOTAL PROTEIN, URINE DIPSTICK: Protein, ur: NEGATIVE mg/dL

## 2018-12-04 MED ORDER — SODIUM CHLORIDE 0.9 % IV SOLN
Freq: Once | INTRAVENOUS | Status: AC
  Administered 2018-12-04: 13:00:00 via INTRAVENOUS
  Filled 2018-12-04: qty 250

## 2018-12-04 MED ORDER — SODIUM CHLORIDE 0.9 % IV SOLN
15.3000 mg/kg | Freq: Once | INTRAVENOUS | Status: AC
  Administered 2018-12-04: 1000 mg via INTRAVENOUS
  Filled 2018-12-04: qty 32

## 2018-12-04 MED ORDER — SODIUM CHLORIDE 0.9% FLUSH
10.0000 mL | Freq: Once | INTRAVENOUS | Status: AC
  Administered 2018-12-04: 12:00:00 10 mL
  Filled 2018-12-04: qty 10

## 2018-12-04 MED ORDER — SODIUM CHLORIDE 0.9% FLUSH
10.0000 mL | INTRAVENOUS | Status: DC | PRN
  Administered 2018-12-04: 10 mL
  Filled 2018-12-04: qty 10

## 2018-12-04 MED ORDER — SODIUM CHLORIDE 0.9 % IV SOLN
1200.0000 mg | Freq: Once | INTRAVENOUS | Status: AC
  Administered 2018-12-04: 13:00:00 1200 mg via INTRAVENOUS
  Filled 2018-12-04: qty 20

## 2018-12-04 MED ORDER — HEPARIN SOD (PORK) LOCK FLUSH 100 UNIT/ML IV SOLN
500.0000 [IU] | Freq: Once | INTRAVENOUS | Status: AC | PRN
  Administered 2018-12-04: 500 [IU]
  Filled 2018-12-04: qty 5

## 2018-12-04 NOTE — Progress Notes (Signed)
Hematology and Oncology Follow Up Visit  Beverly Davis [id number] April 28, 1940 78 y.o. 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 She is doing well but states that she had an episodes of feeling off balance and bumped into a well. Thankfully she has not had any falls or syncopal episodes.  Her AFP was down to 319 several weeks ago.  No fever, chills, n/v, cough, rash, dizziness, SOB, chest pain, palpitations, abdominal pain or changes in bowel or bladder habits. No bleeding, no bruising or petechiae.   No swelling, tenderness, numbness or tingling in her extremities. She states that her ankles will occasionally swell but improve when she props up her feet.  Her appetite comes and goes. She is hydrating well and her weight is stable at 142 lbs.   ECOG Performance Status: 1 - Symptomatic but completely ambulatory  Medications:  Allergies as of 12/04/2018      Reactions   Metformin And Related Other (See Comments)   Unknown   Welchol [colesevelam Hcl] Other (See Comments)   Unknown      Medication List       Accurate as of December 04, 2018 12:17 PM. If you have any questions, ask your nurse or doctor.        cyanocobalamin 1000 MCG tablet Take 1,000 mcg by mouth daily.   glipiZIDE 5 MG tablet Commonly known as: GLUCOTROL Take 1 tablet 3 x /day with meals for Diabetes   Januvia 100 MG tablet Generic drug: sitaGLIPtin Take 100 mg by mouth daily.   lactulose 10 GM/15ML solution Commonly known as: CHRONULAC Take 30 mLs (20 g total) by mouth 3 (three) times daily. What  changed:   how much to take  when to take this  additional instructions   levothyroxine 112 MCG tablet Commonly known as: Synthroid Take 1 tablet daily on an empty stomach with only water for 30 minutes & no Antacid meds, Calcium or Magnesium for 4 hours & avoid Biotin   lidocaine-prilocaine cream Commonly known as: EMLA Apply to affected area once   magnesium oxide 400 MG tablet Commonly known as: MAG-OX Take 400 mg by mouth daily.   metFORMIN 500 MG tablet Commonly known as: Glucophage Take 1 tablet with Breakfast & Lunch and 2 tablets with supper for Diabetes   ondansetron 8 MG tablet Commonly known as: Zofran Take 1 tablet (8 mg total) by mouth 2 (two) times daily as needed (Nausea or vomiting).   OneTouch Delica Plus [id number] Misc   OneTouch Ultra test strip Generic drug: glucose blood USE 1 STRIP TO CHECK GLUCOSE ONCE DAILY   prochlorperazine 10 MG tablet Commonly known as: COMPAZINE Take 1 tablet (10 mg total) by mouth every 6 (six) hours as needed (Nausea or vomiting).   Vitamin D3 125 MCG (5000 UT) Caps Take 5,000 Units by mouth daily.       Allergies:  Allergies  Allergen Reactions  . Metformin And Related Other (See Comments)    Unknown  . Welchol [Colesevelam Hcl] Other (See Comments)    Unknown    Past Medical History, Surgical history, Social history, and Family History were reviewed and updated.  Review of Systems: All other 10 point review of systems is negative.   Physical Exam:  vitals were not taken for this visit.   Wt Readings from Last 3 Encounters:  11/11/18 141 lb 4 oz (64.1 kg)  11/04/18 142 lb (64.4 kg)  10/20/18 143 lb 3.2 oz (65 kg)    Ocular: Sclerae unicteric, pupils equal, round and reactive to light Ear-nose-throat: Oropharynx clear, dentition fair Lymphatic: No cervical or supraclavicular adenopathy Lungs no rales or rhonchi, good excursion bilaterally Heart regular rate and rhythm, no murmur appreciated Abd  soft, nontender, positive bowel sounds, no liver or spleen tip palpated on exam, no fluid wave  MSK no focal spinal tenderness, no joint edema Neuro: non-focal, well-oriented, appropriate affect Breasts: Deferred   Lab Results  Component Value Date   WBC 2.1 (L) 12/04/2018   HGB 10.4 (L) 12/04/2018   HCT 31.1 (L) 12/04/2018   MCV 106.9 (H) 12/04/2018   PLT 76 (L) 12/04/2018   Lab Results  Component Value Date   FERRITIN 119 03/23/2016   IRON 36 03/23/2016   TIBC 234 (L) 03/23/2016   UIBC 198 03/23/2016   IRONPCTSAT 15 03/23/2016   Lab Results  Component Value Date   RETICCTPCT 1.2 09/05/2015   RBC 2.91 (L) 12/04/2018   RETICCTABS 40,080 09/05/2015   No results found for: KPAFRELGTCHN, LAMBDASER, KAPLAMBRATIO No results found for: Kandis Cocking, [facility] Lab Results  Component Value Date   TOTALPROTELP 7.1 02/03/2008   ALBUMINELP 54.8 (L) 02/03/2008   A1GS 4.2 02/03/2008   A2GS 9.5 02/03/2008   BETS 8.1 (H) 02/03/2008   BETA2SER 5.8 02/03/2008   GAMS 17.6 02/03/2008   MSPIKE NOT DET 02/03/2008   SPEI * 02/03/2008     Chemistry      Component Value Date/Time   NA 136 11/11/2018 1115   NA 139 01/01/2017 1034   K 4.1 11/11/2018 1115   K 4.1 01/01/2017 1034   CL 109 11/11/2018 1115   CL 107 01/01/2017 1034   CO2 21 (L) 11/11/2018 1115   CO2 29 01/01/2017 1034   BUN 13 11/11/2018 1115   BUN 9 01/01/2017 1034   CREATININE 0.59 11/11/2018 1115   CREATININE 0.62 07/29/2018 1116      Component Value Date/Time   CALCIUM 8.7 (L) 11/11/2018 1115   CALCIUM 9.4 01/01/2017 1034   ALKPHOS 117 11/11/2018 1115   ALKPHOS 81 01/01/2017 1034   AST 63 (H) 11/11/2018 1115   ALT 43 11/11/2018 1115   ALT 48 (H) 01/01/2017 1034   BILITOT 1.4 (H) 11/11/2018 1115       Impression and Plan: Beverly Davis is a very pleasant 78 yo caucasian female with 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 We will proceed with treatment today as planned.  We will repeat scans  prior to her next visit.  We will see her back in another 3 weeks for MD follow-up.  She will contact our office with nay questions or concerns. We can certainly see her sooner if needed.   Laverna Peace, NP 9/11/202012:17 PM

## 2018-12-04 NOTE — Patient Instructions (Signed)
Atezolizumab injection What is this medicine? ATEZOLIZUMAB (a te zoe LIZ ue mab) is a monoclonal antibody. It is used to treat bladder cancer (urothelial cancer), non-small cell lung cancer, small cell lung cancer, and breast cancer. This medicine may be used for other purposes; ask your health care provider or pharmacist if you have questions. COMMON BRAND NAME(S): Tecentriq What should I tell my health care provider before I take this medicine? They need to know if you have any of these conditions:  diabetes  immune system problems  infection  inflammatory bowel disease  liver disease  lung or breathing disease  lupus  nervous system problems like myasthenia gravis or Guillain-Barre syndrome  organ transplant  an unusual or allergic reaction to atezolizumab, other medicines, foods, dyes, or preservatives  pregnant or trying to get pregnant  breast-feeding How should I use this medicine? This medicine is for infusion into a vein. It is given by a health care professional in a hospital or clinic setting. A special MedGuide will be given to you before each treatment. Be sure to read this information carefully each time. Talk to your pediatrician regarding the use of this medicine in children. Special care may be needed. Overdosage: If you think you have taken too much of this medicine contact a poison control center or emergency room at once. NOTE: This medicine is only for you. Do not share this medicine with others. What if I miss a dose? It is important not to miss your dose. Call your doctor or health care professional if you are unable to keep an appointment. What may interact with this medicine? Interactions have not been studied. This list may not describe all possible interactions. Give your health care provider a list of all the medicines, herbs, non-prescription drugs, or dietary supplements you use. Also tell them if you smoke, drink alcohol, or use illegal drugs.  Some items may interact with your medicine. What should I watch for while using this medicine? Your condition will be monitored carefully while you are receiving this medicine. You may need blood work done while you are taking this medicine. Do not become pregnant while taking this medicine or for at least 5 months after stopping it. Women should inform their doctor if they wish to become pregnant or think they might be pregnant. There is a potential for serious side effects to an unborn child. Talk to your health care professional or pharmacist for more information. Do not breast-feed an infant while taking this medicine or for at least 5 months after the last dose. What side effects may I notice from receiving this medicine? Side effects that you should report to your doctor or health care professional as soon as possible:  allergic reactions like skin rash, itching or hives, swelling of the face, lips, or tongue  black, tarry stools  bloody or watery diarrhea  breathing problems  changes in vision  chest pain or chest tightness  chills  facial flushing  fever  headache  signs and symptoms of high blood sugar such as dizziness; dry mouth; dry skin; fruity breath; nausea; stomach pain; increased hunger or thirst; increased urination  signs and symptoms of liver injury like dark yellow or brown urine; general ill feeling or flu-like symptoms; light-colored stools; loss of appetite; nausea; right upper belly pain; unusually weak or tired; yellowing of the eyes or skin  stomach pain  trouble passing urine or change in the amount of urine Side effects that usually do not require medical  attention (report to your doctor or health care professional if they continue or are bothersome):  cough  diarrhea  joint pain  muscle pain  muscle weakness  tiredness  weight loss This list may not describe all possible side effects. Call your doctor for medical advice about side  effects. You may report side effects to FDA at 1-800-FDA-1088. Where should I keep my medicine? This drug is given in a hospital or clinic and will not be stored at home. NOTE: This sheet is a summary. It may not cover all possible information. If you have questions about this medicine, talk to your doctor, pharmacist, or health care provider.  2020 Elsevier/Gold Standard (2017-06-13 09:33:38) Bevacizumab injection What is this medicine? BEVACIZUMAB (be va SIZ yoo mab) is a monoclonal antibody. It is used to treat many types of cancer. This medicine may be used for other purposes; ask your health care provider or pharmacist if you have questions. COMMON BRAND NAME(S): Avastin, MVASI, Zirabev What should I tell my health care provider before I take this medicine? They need to know if you have any of these conditions:  diabetes  heart disease  high blood pressure  history of coughing up blood  prior anthracycline chemotherapy (e.g., doxorubicin, daunorubicin, epirubicin)  recent or ongoing radiation therapy  recent or planning to have surgery  stroke  an unusual or allergic reaction to bevacizumab, hamster proteins, mouse proteins, other medicines, foods, dyes, or preservatives  pregnant or trying to get pregnant  breast-feeding How should I use this medicine? This medicine is for infusion into a vein. It is given by a health care professional in a hospital or clinic setting. Talk to your pediatrician regarding the use of this medicine in children. Special care may be needed. Overdosage: If you think you have taken too much of this medicine contact a poison control center or emergency room at once. NOTE: This medicine is only for you. Do not share this medicine with others. What if I miss a dose? It is important not to miss your dose. Call your doctor or health care professional if you are unable to keep an appointment. What may interact with this medicine? Interactions are  not expected. This list may not describe all possible interactions. Give your health care provider a list of all the medicines, herbs, non-prescription drugs, or dietary supplements you use. Also tell them if you smoke, drink alcohol, or use illegal drugs. Some items may interact with your medicine. What should I watch for while using this medicine? Your condition will be monitored carefully while you are receiving this medicine. You will need important blood work and urine testing done while you are taking this medicine. This medicine may increase your risk to bruise or bleed. Call your doctor or health care professional if you notice any unusual bleeding. This medicine should be started at least 28 days following major surgery and the site of the surgery should be totally healed. Check with your doctor before scheduling dental work or surgery while you are receiving this treatment. Talk to your doctor if you have recently had surgery or if you have a wound that has not healed. Do not become pregnant while taking this medicine or for 6 months after stopping it. Women should inform their doctor if they wish to become pregnant or think they might be pregnant. There is a potential for serious side effects to an unborn child. Talk to your health care professional or pharmacist for more information. Do not breast-feed  an infant while taking this medicine and for 6 months after the last dose. This medicine has caused ovarian failure in some women. This medicine may interfere with the ability to have a child. You should talk to your doctor or health care professional if you are concerned about your fertility. What side effects may I notice from receiving this medicine? Side effects that you should report to your doctor or health care professional as soon as possible:  allergic reactions like skin rash, itching or hives, swelling of the face, lips, or tongue  chest pain or chest tightness  chills  coughing  up blood  high fever  seizures  severe constipation  signs and symptoms of bleeding such as bloody or black, tarry stools; red or dark-brown urine; spitting up blood or brown material that looks like coffee grounds; red spots on the skin; unusual bruising or bleeding from the eye, gums, or nose  signs and symptoms of a blood clot such as breathing problems; chest pain; severe, sudden headache; pain, swelling, warmth in the leg  signs and symptoms of a stroke like changes in vision; confusion; trouble speaking or understanding; severe headaches; sudden numbness or weakness of the face, arm or leg; trouble walking; dizziness; loss of balance or coordination  stomach pain  sweating  swelling of legs or ankles  vomiting  weight gain Side effects that usually do not require medical attention (report to your doctor or health care professional if they continue or are bothersome):  back pain  changes in taste  decreased appetite  dry skin  nausea  tiredness This list may not describe all possible side effects. Call your doctor for medical advice about side effects. You may report side effects to FDA at 1-800-FDA-1088. Where should I keep my medicine? This drug is given in a hospital or clinic and will not be stored at home. NOTE: This sheet is a summary. It may not cover all possible information. If you have questions about this medicine, talk to your doctor, pharmacist, or health care provider.  2020 Elsevier/Gold Standard (2016-03-08 14:33:29)

## 2018-12-04 NOTE — Telephone Encounter (Signed)
Appointments dates changed and CT scan has been scheduled/ Contrast w/ instructions provided.  New updated schedule was given to patient as well

## 2018-12-04 NOTE — Patient Instructions (Signed)

## 2018-12-04 NOTE — Progress Notes (Signed)
Okay to receive Mvasi, biosimilar for Avastin.

## 2018-12-05 LAB — T4: T4, Total: 6.7 ug/dL (ref 4.5–12.0)

## 2018-12-05 LAB — AFP TUMOR MARKER: AFP, Serum, Tumor Marker: 279 ng/mL — ABNORMAL HIGH (ref 0.0–8.3)

## 2018-12-07 LAB — TSH: TSH: 4.462 u[IU]/mL — ABNORMAL HIGH (ref 0.308–3.960)

## 2018-12-14 ENCOUNTER — Telehealth: Payer: Self-pay | Admitting: *Deleted

## 2018-12-14 DIAGNOSIS — C229 Malignant neoplasm of liver, not specified as primary or secondary: Secondary | ICD-10-CM | POA: Diagnosis not present

## 2018-12-14 DIAGNOSIS — K7290 Hepatic failure, unspecified without coma: Secondary | ICD-10-CM | POA: Diagnosis not present

## 2018-12-14 DIAGNOSIS — K7460 Unspecified cirrhosis of liver: Secondary | ICD-10-CM | POA: Diagnosis not present

## 2018-12-14 NOTE — Telephone Encounter (Signed)
Call received from patient about her schedule.  Pt has old schedule at home and is confused about her appt times.  Appointment times reviewed with patient, patient remains confused.  Informed pt that I would have scheduling mail her a copy of her upcoming appointments.  Pt appreciative of assistance and has no further questions at this time.

## 2018-12-15 ENCOUNTER — Telehealth: Payer: Self-pay | Admitting: *Deleted

## 2018-12-15 NOTE — Telephone Encounter (Signed)
Call received from patient once again confused about her schedule.  Schedule reviewed with patient.

## 2018-12-18 ENCOUNTER — Inpatient Hospital Stay: Payer: PPO

## 2018-12-18 ENCOUNTER — Ambulatory Visit (HOSPITAL_BASED_OUTPATIENT_CLINIC_OR_DEPARTMENT_OTHER)
Admission: RE | Admit: 2018-12-18 | Discharge: 2018-12-18 | Disposition: A | Discharge status: Home / Self Care | Payer: PPO | Source: Ambulatory Visit | Attending: Family | Admitting: Family

## 2018-12-18 ENCOUNTER — Encounter (HOSPITAL_BASED_OUTPATIENT_CLINIC_OR_DEPARTMENT_OTHER): Payer: Self-pay

## 2018-12-18 ENCOUNTER — Other Ambulatory Visit: Payer: Self-pay

## 2018-12-18 VITALS — BP 132/59 | HR 87 | Temp 97.8°F | Resp 20

## 2018-12-18 DIAGNOSIS — C220 Liver cell carcinoma: Secondary | ICD-10-CM | POA: Insufficient documentation

## 2018-12-18 DIAGNOSIS — Z95828 Presence of other vascular implants and grafts: Secondary | ICD-10-CM

## 2018-12-18 DIAGNOSIS — Z5112 Encounter for antineoplastic immunotherapy: Secondary | ICD-10-CM | POA: Diagnosis not present

## 2018-12-18 MED ORDER — HEPARIN SOD (PORK) LOCK FLUSH 100 UNIT/ML IV SOLN
500.0000 [IU] | Freq: Once | INTRAVENOUS | Status: AC
  Administered 2018-12-18: 11:00:00 500 [IU] via INTRAVENOUS
  Filled 2018-12-18: qty 5

## 2018-12-18 MED ORDER — IOHEXOL 300 MG/ML  SOLN
100.0000 mL | Freq: Once | INTRAMUSCULAR | Status: AC | PRN
  Administered 2018-12-18: 100 mL via INTRAVENOUS

## 2018-12-18 MED ORDER — SODIUM CHLORIDE 0.9% FLUSH
10.0000 mL | INTRAVENOUS | Status: DC | PRN
  Administered 2018-12-18: 11:00:00 10 mL via INTRAVENOUS
  Filled 2018-12-18: qty 10

## 2018-12-18 NOTE — Patient Instructions (Signed)

## 2018-12-21 ENCOUNTER — Telehealth: Payer: Self-pay | Admitting: *Deleted

## 2018-12-21 NOTE — Telephone Encounter (Signed)
Call received back from patient.  Patient notified per order of Dr. Marin Olp that "everything looks pretty stable on the CT scan."  Pt appreciative of information and has no questions or concerns at this time.

## 2018-12-21 NOTE — Telephone Encounter (Signed)
-----   Message from [name], MD sent at 12/18/2018  5:26 PM EDT ----- Call - everything looks pretty stable on the CT scan!!  [facility]

## 2018-12-22 ENCOUNTER — Ambulatory Visit: Payer: PPO

## 2018-12-22 ENCOUNTER — Ambulatory Visit: Payer: PPO | Admitting: Family

## 2018-12-22 ENCOUNTER — Other Ambulatory Visit: Payer: PPO

## 2018-12-22 ENCOUNTER — Other Ambulatory Visit: Payer: Self-pay | Admitting: Interventional Radiology

## 2018-12-22 DIAGNOSIS — C220 Liver cell carcinoma: Secondary | ICD-10-CM

## 2018-12-25 ENCOUNTER — Inpatient Hospital Stay (HOSPITAL_BASED_OUTPATIENT_CLINIC_OR_DEPARTMENT_OTHER): Payer: PPO | Admitting: Family

## 2018-12-25 ENCOUNTER — Inpatient Hospital Stay: Payer: PPO

## 2018-12-25 ENCOUNTER — Other Ambulatory Visit: Payer: Self-pay

## 2018-12-25 ENCOUNTER — Encounter: Payer: Self-pay | Admitting: Hematology & Oncology

## 2018-12-25 ENCOUNTER — Inpatient Hospital Stay: Payer: PPO | Attending: Hematology & Oncology

## 2018-12-25 VITALS — BP 128/57 | HR 80 | Temp 97.7°F | Resp 18

## 2018-12-25 VITALS — BP 125/54 | HR 82 | Temp 97.8°F | Resp 19 | Wt 144.0 lb

## 2018-12-25 DIAGNOSIS — Z79899 Other long term (current) drug therapy: Secondary | ICD-10-CM | POA: Insufficient documentation

## 2018-12-25 DIAGNOSIS — C220 Liver cell carcinoma: Secondary | ICD-10-CM | POA: Diagnosis not present

## 2018-12-25 DIAGNOSIS — D509 Iron deficiency anemia, unspecified: Secondary | ICD-10-CM | POA: Diagnosis not present

## 2018-12-25 DIAGNOSIS — R600 Localized edema: Secondary | ICD-10-CM | POA: Diagnosis not present

## 2018-12-25 DIAGNOSIS — Z9181 History of falling: Secondary | ICD-10-CM | POA: Insufficient documentation

## 2018-12-25 DIAGNOSIS — E032 Hypothyroidism due to medicaments and other exogenous substances: Secondary | ICD-10-CM

## 2018-12-25 DIAGNOSIS — K7581 Nonalcoholic steatohepatitis (NASH): Secondary | ICD-10-CM | POA: Diagnosis not present

## 2018-12-25 DIAGNOSIS — Z853 Personal history of malignant neoplasm of breast: Secondary | ICD-10-CM | POA: Diagnosis not present

## 2018-12-25 DIAGNOSIS — Z5112 Encounter for antineoplastic immunotherapy: Secondary | ICD-10-CM | POA: Diagnosis not present

## 2018-12-25 DIAGNOSIS — K7460 Unspecified cirrhosis of liver: Secondary | ICD-10-CM | POA: Insufficient documentation

## 2018-12-25 DIAGNOSIS — Z7984 Long term (current) use of oral hypoglycemic drugs: Secondary | ICD-10-CM | POA: Diagnosis not present

## 2018-12-25 DIAGNOSIS — Z8505 Personal history of malignant neoplasm of liver: Secondary | ICD-10-CM

## 2018-12-25 DIAGNOSIS — E119 Type 2 diabetes mellitus without complications: Secondary | ICD-10-CM | POA: Insufficient documentation

## 2018-12-25 LAB — CMP (CANCER CENTER ONLY)
ALT: 46 U/L — ABNORMAL HIGH (ref 0–44)
AST: 66 U/L — ABNORMAL HIGH (ref 15–41)
Albumin: 3 g/dL — ABNORMAL LOW (ref 3.5–5.0)
Alkaline Phosphatase: 133 U/L — ABNORMAL HIGH (ref 38–126)
Anion gap: 9 (ref 5–15)
BUN: 12 mg/dL (ref 8–23)
CO2: 20 mmol/L — ABNORMAL LOW (ref 22–32)
Calcium: 9 mg/dL (ref 8.9–10.3)
Chloride: 106 mmol/L (ref 98–111)
Creatinine: 0.6 mg/dL (ref 0.44–1.00)
GFR, Est AFR Am: >60 mL/min (ref >60)
GFR, Estimated: >60 mL/min (ref >60)
Glucose, Bld: 228 mg/dL — ABNORMAL HIGH (ref 70–99)
Potassium: 4.2 mmol/L (ref 3.5–5.1)
Sodium: 135 mmol/L (ref 135–145)
Total Bilirubin: 1.6 mg/dL — ABNORMAL HIGH (ref 0.3–1.2)
Total Protein: 5.9 g/dL — ABNORMAL LOW (ref 6.5–8.1)

## 2018-12-25 LAB — CBC WITH DIFFERENTIAL (CANCER CENTER ONLY)
Abs Immature Granulocytes: 0 10*3/uL (ref 0.00–0.07)
Basophils Absolute: 0 10*3/uL (ref 0.0–0.1)
Basophils Relative: 1 %
Eosinophils Absolute: 0.1 10*3/uL (ref 0.0–0.5)
Eosinophils Relative: 3 %
HCT: 29.9 % — ABNORMAL LOW (ref 36.0–46.0)
Hemoglobin: 9.9 g/dL — ABNORMAL LOW (ref 12.0–15.0)
Immature Granulocytes: 0 %
Lymphocytes Relative: 20 %
Lymphs Abs: 0.5 10*3/uL — ABNORMAL LOW (ref 0.7–4.0)
MCH: 36 pg — ABNORMAL HIGH (ref 26.0–34.0)
MCHC: 33.1 g/dL (ref 30.0–36.0)
MCV: 108.7 fL — ABNORMAL HIGH (ref 80.0–100.0)
Monocytes Absolute: 0.3 10*3/uL (ref 0.1–1.0)
Monocytes Relative: 14 %
Neutro Abs: 1.4 10*3/uL — ABNORMAL LOW (ref 1.7–7.7)
Neutrophils Relative %: 62 %
Platelet Count: 74 10*3/uL — ABNORMAL LOW (ref 150–400)
RBC: 2.75 MIL/uL — ABNORMAL LOW (ref 3.87–5.11)
RDW: 15.2 % (ref 11.5–15.5)
WBC Count: 2.2 10*3/uL — ABNORMAL LOW (ref 4.0–10.5)
nRBC: 0 % (ref 0.0–0.2)

## 2018-12-25 MED ORDER — SODIUM CHLORIDE 0.9 % IV SOLN
1200.0000 mg | Freq: Once | INTRAVENOUS | Status: AC
  Administered 2018-12-25: 1200 mg via INTRAVENOUS
  Filled 2018-12-25: qty 20

## 2018-12-25 MED ORDER — HEPARIN SOD (PORK) LOCK FLUSH 100 UNIT/ML IV SOLN
500.0000 [IU] | Freq: Once | INTRAVENOUS | Status: AC | PRN
  Administered 2018-12-25: 15:00:00 500 [IU]
  Filled 2018-12-25: qty 5

## 2018-12-25 MED ORDER — SODIUM CHLORIDE 0.9 % IV SOLN
Freq: Once | INTRAVENOUS | Status: AC
  Administered 2018-12-25: 12:00:00 via INTRAVENOUS
  Filled 2018-12-25: qty 250

## 2018-12-25 MED ORDER — SODIUM CHLORIDE 0.9% FLUSH
10.0000 mL | INTRAVENOUS | Status: DC | PRN
  Administered 2018-12-25: 10 mL
  Filled 2018-12-25: qty 10

## 2018-12-25 MED ORDER — SODIUM CHLORIDE 0.9 % IV SOLN
15.2000 mg/kg | Freq: Once | INTRAVENOUS | Status: AC
  Administered 2018-12-25: 13:00:00 1000 mg via INTRAVENOUS
  Filled 2018-12-25: qty 32

## 2018-12-25 NOTE — Progress Notes (Signed)
Per Dr. Heide Guile OK to treat with today's lab values. Pharmacy notified.

## 2018-12-25 NOTE — Patient Instructions (Signed)
Bevacizumab injection What is this medicine? BEVACIZUMAB (be va SIZ yoo mab) is a monoclonal antibody. It is used to treat many types of cancer. This medicine may be used for other purposes; ask your health care provider or pharmacist if you have questions. COMMON BRAND NAME(S): Avastin, MVASI, Zirabev What should I tell my health care provider before I take this medicine? They need to know if you have any of these conditions:  diabetes  heart disease  high blood pressure  history of coughing up blood  prior anthracycline chemotherapy (e.g., doxorubicin, daunorubicin, epirubicin)  recent or ongoing radiation therapy  recent or planning to have surgery  stroke  an unusual or allergic reaction to bevacizumab, hamster proteins, mouse proteins, other medicines, foods, dyes, or preservatives  pregnant or trying to get pregnant  breast-feeding How should I use this medicine? This medicine is for infusion into a vein. It is given by a health care professional in a hospital or clinic setting. Talk to your pediatrician regarding the use of this medicine in children. Special care may be needed. Overdosage: If you think you have taken too much of this medicine contact a poison control center or emergency room at once. NOTE: This medicine is only for you. Do not share this medicine with others. What if I miss a dose? It is important not to miss your dose. Call your doctor or health care professional if you are unable to keep an appointment. What may interact with this medicine? Interactions are not expected. This list may not describe all possible interactions. Give your health care provider a list of all the medicines, herbs, non-prescription drugs, or dietary supplements you use. Also tell them if you smoke, drink alcohol, or use illegal drugs. Some items may interact with your medicine. What should I watch for while using this medicine? Your condition will be monitored carefully while  you are receiving this medicine. You will need important blood work and urine testing done while you are taking this medicine. This medicine may increase your risk to bruise or bleed. Call your doctor or health care professional if you notice any unusual bleeding. This medicine should be started at least 28 days following major surgery and the site of the surgery should be totally healed. Check with your doctor before scheduling dental work or surgery while you are receiving this treatment. Talk to your doctor if you have recently had surgery or if you have a wound that has not healed. Do not become pregnant while taking this medicine or for 6 months after stopping it. Women should inform their doctor if they wish to become pregnant or think they might be pregnant. There is a potential for serious side effects to an unborn child. Talk to your health care professional or pharmacist for more information. Do not breast-feed an infant while taking this medicine and for 6 months after the last dose. This medicine has caused ovarian failure in some women. This medicine may interfere with the ability to have a child. You should talk to your doctor or health care professional if you are concerned about your fertility. What side effects may I notice from receiving this medicine? Side effects that you should report to your doctor or health care professional as soon as possible:  allergic reactions like skin rash, itching or hives, swelling of the face, lips, or tongue  chest pain or chest tightness  chills  coughing up blood  high fever  seizures  severe constipation  signs and symptoms  of bleeding such as bloody or black, tarry stools; red or dark-brown urine; spitting up blood or brown material that looks like coffee grounds; red spots on the skin; unusual bruising or bleeding from the eye, gums, or nose  signs and symptoms of a blood clot such as breathing problems; chest pain; severe, sudden  headache; pain, swelling, warmth in the leg  signs and symptoms of a stroke like changes in vision; confusion; trouble speaking or understanding; severe headaches; sudden numbness or weakness of the face, arm or leg; trouble walking; dizziness; loss of balance or coordination  stomach pain  sweating  swelling of legs or ankles  vomiting  weight gain Side effects that usually do not require medical attention (report to your doctor or health care professional if they continue or are bothersome):  back pain  changes in taste  decreased appetite  dry skin  nausea  tiredness This list may not describe all possible side effects. Call your doctor for medical advice about side effects. You may report side effects to FDA at 1-800-FDA-1088. Where should I keep my medicine? This drug is given in a hospital or clinic and will not be stored at home. NOTE: This sheet is a summary. It may not cover all possible information. If you have questions about this medicine, talk to your doctor, pharmacist, or health care provider.  2020 Elsevier/Gold Standard (2016-03-08 14:33:29) Atezolizumab injection What is this medicine? ATEZOLIZUMAB (a te zoe LIZ ue mab) is a monoclonal antibody. It is used to treat bladder cancer (urothelial cancer), non-small cell lung cancer, small cell lung cancer, and breast cancer. This medicine may be used for other purposes; ask your health care provider or pharmacist if you have questions. COMMON BRAND NAME(S): Tecentriq What should I tell my health care provider before I take this medicine? They need to know if you have any of these conditions:  diabetes  immune system problems  infection  inflammatory bowel disease  liver disease  lung or breathing disease  lupus  nervous system problems like myasthenia gravis or Guillain-Barre syndrome  organ transplant  an unusual or allergic reaction to atezolizumab, other medicines, foods, dyes, or  preservatives  pregnant or trying to get pregnant  breast-feeding How should I use this medicine? This medicine is for infusion into a vein. It is given by a health care professional in a hospital or clinic setting. A special MedGuide will be given to you before each treatment. Be sure to read this information carefully each time. Talk to your pediatrician regarding the use of this medicine in children. Special care may be needed. Overdosage: If you think you have taken too much of this medicine contact a poison control center or emergency room at once. NOTE: This medicine is only for you. Do not share this medicine with others. What if I miss a dose? It is important not to miss your dose. Call your doctor or health care professional if you are unable to keep an appointment. What may interact with this medicine? Interactions have not been studied. This list may not describe all possible interactions. Give your health care provider a list of all the medicines, herbs, non-prescription drugs, or dietary supplements you use. Also tell them if you smoke, drink alcohol, or use illegal drugs. Some items may interact with your medicine. What should I watch for while using this medicine? Your condition will be monitored carefully while you are receiving this medicine. You may need blood work done while you are taking  this medicine. Do not become pregnant while taking this medicine or for at least 5 months after stopping it. Women should inform their doctor if they wish to become pregnant or think they might be pregnant. There is a potential for serious side effects to an unborn child. Talk to your health care professional or pharmacist for more information. Do not breast-feed an infant while taking this medicine or for at least 5 months after the last dose. What side effects may I notice from receiving this medicine? Side effects that you should report to your doctor or health care professional as soon  as possible:  allergic reactions like skin rash, itching or hives, swelling of the face, lips, or tongue  black, tarry stools  bloody or watery diarrhea  breathing problems  changes in vision  chest pain or chest tightness  chills  facial flushing  fever  headache  signs and symptoms of high blood sugar such as dizziness; dry mouth; dry skin; fruity breath; nausea; stomach pain; increased hunger or thirst; increased urination  signs and symptoms of liver injury like dark yellow or brown urine; general ill feeling or flu-like symptoms; light-colored stools; loss of appetite; nausea; right upper belly pain; unusually weak or tired; yellowing of the eyes or skin  stomach pain  trouble passing urine or change in the amount of urine Side effects that usually do not require medical attention (report to your doctor or health care professional if they continue or are bothersome):  cough  diarrhea  joint pain  muscle pain  muscle weakness  tiredness  weight loss This list may not describe all possible side effects. Call your doctor for medical advice about side effects. You may report side effects to FDA at 1-800-FDA-1088. Where should I keep my medicine? This drug is given in a hospital or clinic and will not be stored at home. NOTE: This sheet is a summary. It may not cover all possible information. If you have questions about this medicine, talk to your doctor, pharmacist, or health care provider.  2020 Elsevier/Gold Standard (2017-06-13 09:33:38)

## 2018-12-25 NOTE — Patient Instructions (Signed)

## 2018-12-25 NOTE — Progress Notes (Signed)
Hematology and Oncology Follow Up Visit  Beverly Davis [id number] Aug 25, 1940 78 y.o. 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 here today for follow-up and treatment. She Davis doing well but feels sluggish at times. Hgb today Davis 9.9. Iron studies are pending.  Her CT scan last week showed overall stable disease.  AFP was down to 279 in September. Today's result Davis pending.  She has not noted any fever, chills, n/v, cough, rash, dizziness, SOB, chest pain, palpitations, abdominal pain or changes in bowel or bladder habits.  No episodes of bleeding, no bruising or petechiae.  She has some mild swelling in her ankles. No redness or pain. Pedal pulses are 2+.  She states that she Davis eating well and hydrated. Her weight Davis stable.   ECOG Performance Status: 1 - Symptomatic but completely ambulatory  Medications:  Allergies as of 12/25/2018      Reactions   Metformin And Related Other (See Comments)   Unknown   Welchol [colesevelam Hcl] Other (See Comments)   Unknown      Medication List       Accurate as of December 25, 2018 11:45 AM. If you have any questions, ask your nurse or doctor.        cyanocobalamin 1000 MCG tablet Take 1,000 mcg by mouth daily.   glipiZIDE 5 MG tablet Commonly known as: GLUCOTROL Take 1 tablet 3 x /day with meals for Diabetes   Januvia 100 MG tablet Generic drug: sitaGLIPtin Take 100 mg by mouth daily.   lactulose 10 GM/15ML solution Commonly known as: CHRONULAC Take 30 mLs (20 g total) by mouth 3 (three) times daily. What changed:   how much to take  when to take this  additional  instructions   levothyroxine 112 MCG tablet Commonly known as: Synthroid Take 1 tablet daily on an empty stomach with only water for 30 minutes & no Antacid meds, Calcium or Magnesium for 4 hours & avoid Biotin   lidocaine-prilocaine cream Commonly known as: EMLA Apply to affected area once   magnesium oxide 400 MG tablet Commonly known as: MAG-OX Take 400 mg by mouth daily.   metFORMIN 500 MG tablet Commonly known as: Glucophage Take 1 tablet with Breakfast & Lunch and 2 tablets with supper for Diabetes   ondansetron 8 MG tablet Commonly known as: Zofran Take 1 tablet (8 mg total) by mouth 2 (two) times daily as needed (Nausea or vomiting).   OneTouch Delica Plus [id number] Misc   OneTouch Ultra test strip Generic drug: glucose blood USE 1 STRIP TO CHECK GLUCOSE ONCE DAILY   prochlorperazine 10 MG tablet Commonly known as: COMPAZINE Take 1 tablet (10 mg total) by mouth every 6 (six) hours as needed (Nausea or vomiting).   Vitamin D3 125 MCG (5000 UT) Caps Take 5,000 Units by mouth daily.       Allergies:  Allergies  Allergen Reactions  . Metformin And Related Other (See Comments)    Unknown  . Welchol [Colesevelam Hcl] Other (See Comments)    Unknown    Past Medical History, Surgical history, Social history, and Family History were reviewed and updated.  Review of Systems: All other 10 point review  of systems Davis negative.   Physical Exam:  weight Davis 144 lb (65.3 kg). Her temporal temperature Davis 97.8 F (36.6 C). Her blood pressure Davis 125/54 (abnormal) and her pulse Davis 82. Her respiration Davis 19 and oxygen saturation Davis 100%.   Wt Readings from Last 3 Encounters:  12/25/18 144 lb (65.3 kg)  12/04/18 143 lb (64.9 kg)  11/11/18 141 lb 4 oz (64.1 kg)    Ocular: Sclerae unicteric, pupils equal, round and reactive to light Ear-nose-throat: Oropharynx clear, dentition fair Lymphatic: No cervical or supraclavicular adenopathy Lungs no rales or rhonchi, good  excursion bilaterally Heart regular rate and rhythm, no murmur appreciated Abd soft, nontender, positive bowel sounds, no liver or spleen tip palpated on exam, no fluid wave  MSK no focal spinal tenderness, no joint edema Neuro: non-focal, well-oriented, appropriate affect Breasts: Deferred   Lab Results  Component Value Date   WBC 2.2 (L) 12/25/2018   HGB 9.9 (L) 12/25/2018   HCT 29.9 (L) 12/25/2018   MCV 108.7 (H) 12/25/2018   PLT 74 (L) 12/25/2018   Lab Results  Component Value Date   FERRITIN 119 03/23/2016   IRON 36 03/23/2016   TIBC 234 (L) 03/23/2016   UIBC 198 03/23/2016   IRONPCTSAT 15 03/23/2016   Lab Results  Component Value Date   RETICCTPCT 1.2 09/05/2015   RBC 2.75 (L) 12/25/2018   RETICCTABS 40,080 09/05/2015   No results found for: KPAFRELGTCHN, LAMBDASER, KAPLAMBRATIO No results found for: Kandis Cocking, [facility] Lab Results  Component Value Date   TOTALPROTELP 7.1 02/03/2008   ALBUMINELP 54.8 (L) 02/03/2008   A1GS 4.2 02/03/2008   A2GS 9.5 02/03/2008   BETS 8.1 (H) 02/03/2008   BETA2SER 5.8 02/03/2008   GAMS 17.6 02/03/2008   MSPIKE NOT DET 02/03/2008   SPEI * 02/03/2008     Chemistry      Component Value Date/Time   NA 135 12/25/2018 1045   NA 139 01/01/2017 1034   K 4.2 12/25/2018 1045   K 4.1 01/01/2017 1034   CL 106 12/25/2018 1045   CL 107 01/01/2017 1034   CO2 20 (L) 12/25/2018 1045   CO2 29 01/01/2017 1034   BUN 12 12/25/2018 1045   BUN 9 01/01/2017 1034   CREATININE 0.60 12/25/2018 1045   CREATININE 0.62 07/29/2018 1116      Component Value Date/Time   CALCIUM 9.0 12/25/2018 1045   CALCIUM 9.4 01/01/2017 1034   ALKPHOS 133 (H) 12/25/2018 1045   ALKPHOS 81 01/01/2017 1034   AST 66 (H) 12/25/2018 1045   ALT 46 (H) 12/25/2018 1045   ALT 48 (H) 01/01/2017 1034   BILITOT 1.6 (H) 12/25/2018 1045       Impression and Plan: [name] Davis a very pleasant 78 yo caucasian female with progressive hepatocellular carcinoma as well  as NASH. Scans last week showed overall stable disease. She Davis feeling a little sluggish.  We will see what her iron studies show and bring her back in for IV iron if needed.  We will proceed with treatment today and plan to see her back in another 3 weeks.  She will contact our office with any questions or concerns. We can certainly see her sooner if needed.   Laverna Peace, NP 10/2/202011:45 AM

## 2018-12-26 LAB — AFP TUMOR MARKER: AFP, Serum, Tumor Marker: 323 ng/mL — ABNORMAL HIGH (ref 0.0–8.3)

## 2018-12-28 LAB — TSH: TSH: 1.988 u[IU]/mL (ref 0.308–3.960)

## 2018-12-28 LAB — IRON AND TIBC
Iron: 81 ug/dL (ref 41–142)
Saturation Ratios: 23 % (ref 21–57)
TIBC: 351 ug/dL (ref 236–444)
UIBC: 270 ug/dL (ref 120–384)

## 2018-12-28 LAB — LACTATE DEHYDROGENASE: LDH: 351 U/L — ABNORMAL HIGH (ref 98–192)

## 2018-12-28 LAB — FERRITIN: Ferritin: 56 ng/mL (ref 11–307)

## 2018-12-29 ENCOUNTER — Telehealth: Payer: Self-pay

## 2018-12-29 NOTE — Telephone Encounter (Signed)
Patient states that these are her fasting values. Will call if BS in <80 and then will schedule an appointment. States that she doesn't have any weakness, poor focus, blurry vision, dizziness, etc.

## 2018-12-29 NOTE — Telephone Encounter (Signed)
Patient states that her blood sugar has been low. Today was 23, Monday-91, Sunday-86, Saturday-73, Friday-114, Thursday-116 and Wednesday-92.  States that she was to call our office if sugars were running low and meds may need to be adjusted. Please advise.   Also, Just an FYI, patient fell on her way to the bathroom this morning, she was in a hurry. Arm and lower back are hurting, will monitor at this time.

## 2018-12-31 ENCOUNTER — Telehealth: Payer: Self-pay

## 2018-12-31 MED ORDER — MECLIZINE HCL 25 MG PO TABS: ORAL_TABLET | ORAL | 0 refills | Status: DC

## 2018-12-31 NOTE — Telephone Encounter (Signed)
Concerned with her meds and says her BS is high (196). Feels very dizzy. Requesting that [place] call her back.

## 2018-12-31 NOTE — Addendum Note (Signed)
Addended by: Liane Comber C on: 12/31/2018 07:00 PM   Modules accepted: Orders

## 2018-12-31 NOTE — Telephone Encounter (Signed)
78 y.o. female calls to report dizziness with neck rotation ~ 1 week that is leading to near falls at home; denies HA, blurry vision, slurred speech, paraesthesias, weakness, CP, dyspnea, palpitations. She is monitoring glucose glosely, no low glucose since reducing PM glipizide. She does not have BP cuff to check BPs but denies worse dizziness with sitting to standing. No smoking hx.    After discussion she would like to trial meclizine over the weekend (cautioned sedation), scheduled close follow up on Monday to evaluate further. Strict ER precautions given over the weekend patient to go to ER if there is weakness, thunderclap headache, visual changes, syncope or any concerning persistent sx.    Current Outpatient Medications:  .  Cholecalciferol (VITAMIN D3) 5000 units CAPS, Take 5,000 Units by mouth daily., Disp: , Rfl:  .  cyanocobalamin 1000 MCG tablet, Take 1,000 mcg by mouth daily., Disp: , Rfl:  .  glipiZIDE (GLUCOTROL) 5 MG tablet, Take 1 tablet 3 x /day with meals for Diabetes, Disp: 270 tablet, Rfl: 3 .  lactulose (CHRONULAC) 10 GM/15ML solution, Take 30 mLs (20 g total) by mouth 3 (three) times daily. (Patient taking differently: Take by mouth daily. Takes 20 mls. Daily.), Disp: 240 mL, Rfl: 3 .  Lancets (ONETOUCH DELICA PLUS [id number]) MISC, , Disp: , Rfl:  .  levothyroxine (SYNTHROID) 112 MCG tablet, Take 1 tablet daily on an empty stomach with only water for 30 minutes & no Antacid meds, Calcium or Magnesium for 4 hours & avoid Biotin, Disp: 90 tablet, Rfl: 3 .  lidocaine-prilocaine (EMLA) cream, Apply to affected area once, Disp: 30 g, Rfl: 3 .  magnesium oxide (MAG-OX) 400 MG tablet, Take 400 mg by mouth daily. , Disp: , Rfl:  .  meclizine (ANTIVERT) 25 MG tablet, 1/2-1 pill up to 3 times daily as needed dizziness., Disp: 30 tablet, Rfl: 0 .  metFORMIN (GLUCOPHAGE) 500 MG tablet, Take 1 tablet with Breakfast & Lunch and 2 tablets with supper for Diabetes, Disp: 360 tablet, Rfl: 3 .   ondansetron (ZOFRAN) 8 MG tablet, Take 1 tablet (8 mg total) by mouth 2 (two) times daily as needed (Nausea or vomiting)., Disp: 30 tablet, Rfl: 1 .  ONETOUCH ULTRA test strip, USE 1 STRIP TO CHECK GLUCOSE ONCE DAILY, Disp: , Rfl:  .  prochlorperazine (COMPAZINE) 10 MG tablet, Take 1 tablet (10 mg total) by mouth every 6 (six) hours as needed (Nausea or vomiting)., Disp: 30 tablet, Rfl: 1 .  sitaGLIPtin (JANUVIA) 100 MG tablet, Take 100 mg by mouth daily., Disp: , Rfl:    Allergies:  Allergies  Allergen Reactions  . Metformin And Related Other (See Comments)    Unknown  . Welchol [Colesevelam Hcl] Other (See Comments)    Unknown   Medical History:  has History of right breast cancer; Osteoporosis; Type 2 diabetes mellitus with stage 2 chronic kidney disease, without long-term current use of insulin ([place]); Hypothyroidism; Essential hypertension; Vitamin D deficiency; Encounter for general adult medical examination with abnormal findings; Hyperlipidemia associated with type 2 diabetes mellitus ([place]); Hepatic encephalopathy ([place]); Pancytopenia ([place]); Hepatocellular carcinoma ([place]); Poor compliance; Hepatoma ([place]); Pulmonary nodule; History of hepatocellular carcinoma; Liver cirrhosis secondary to NASH [facility]); FHx: heart disease; B12 deficiency; and Goals of care, counseling/discussion on their problem list. Surgical History:  She  has a past surgical history that includes Breast lumpectomy with axillary lymph node biopsy (Right, 06/25/2006); Cholecystectomy; Abdominal hysterectomy; Tubal ligation (1978); ir generic historical (03/28/2016); ir generic historical (05/01/2016); ir generic  historical (05/01/2016); ir generic historical (05/01/2016); ir generic historical (05/01/2016); ir generic historical (05/01/2016); ir generic historical (05/01/2016); ir generic historical (05/01/2016); ir generic historical (05/01/2016); ir generic historical (05/01/2016); Radiofrequency ablation (N/A, 06/14/2016); IR Radiologist Eval & Mgmt  (07/31/2016); IR Radiologist Eval & Mgmt (12/25/2016); Breast lumpectomy (Right, 2008); IR Radiologist Eval & Mgmt (12/10/2017); Radiology with anesthesia (Right, 01/21/2018); IR Radiologist Eval & Mgmt (04/21/2018); IR Radiologist Eval & Mgmt (08/14/2018); and IR IMAGING GUIDED PORT INSERTION (09/28/2018). Family History:  Herfamily history includes Cancer in her father, mother, and paternal grandfather; Diabetes in her maternal aunt, sister, and sister; Heart Problems in her maternal grandfather; Hypertension in her sister; Liver disease in her brother. Social History:   reports that she has never smoked. She has never used smokeless tobacco. She reports that she does not drink alcohol or use drugs.

## 2019-01-04 ENCOUNTER — Encounter: Payer: Self-pay | Admitting: Adult Health

## 2019-01-04 ENCOUNTER — Other Ambulatory Visit: Payer: Self-pay

## 2019-01-04 ENCOUNTER — Ambulatory Visit (INDEPENDENT_AMBULATORY_CARE_PROVIDER_SITE_OTHER): Payer: PPO | Admitting: Adult Health

## 2019-01-04 VITALS — BP 124/64 | HR 83 | Temp 97.9°F | Ht 62.0 in | Wt 154.0 lb

## 2019-01-04 DIAGNOSIS — E162 Hypoglycemia, unspecified: Secondary | ICD-10-CM

## 2019-01-04 DIAGNOSIS — M25472 Effusion, left ankle: Secondary | ICD-10-CM | POA: Diagnosis not present

## 2019-01-04 DIAGNOSIS — Z8249 Family history of ischemic heart disease and other diseases of the circulatory system: Secondary | ICD-10-CM | POA: Diagnosis not present

## 2019-01-04 DIAGNOSIS — E1122 Type 2 diabetes mellitus with diabetic chronic kidney disease: Secondary | ICD-10-CM

## 2019-01-04 DIAGNOSIS — M25471 Effusion, right ankle: Secondary | ICD-10-CM | POA: Diagnosis not present

## 2019-01-04 DIAGNOSIS — K7581 Nonalcoholic steatohepatitis (NASH): Secondary | ICD-10-CM | POA: Diagnosis not present

## 2019-01-04 DIAGNOSIS — R011 Cardiac murmur, unspecified: Secondary | ICD-10-CM

## 2019-01-04 DIAGNOSIS — K7290 Hepatic failure, unspecified without coma: Secondary | ICD-10-CM

## 2019-01-04 DIAGNOSIS — K7682 Hepatic encephalopathy: Secondary | ICD-10-CM

## 2019-01-04 DIAGNOSIS — N182 Chronic kidney disease, stage 2 (mild): Secondary | ICD-10-CM

## 2019-01-04 DIAGNOSIS — K7460 Unspecified cirrhosis of liver: Secondary | ICD-10-CM | POA: Diagnosis not present

## 2019-01-04 DIAGNOSIS — I10 Essential (primary) hypertension: Secondary | ICD-10-CM

## 2019-01-04 DIAGNOSIS — R42 Dizziness and giddiness: Secondary | ICD-10-CM

## 2019-01-04 MED ORDER — GLIPIZIDE 5 MG PO TABS: ORAL_TABLET | ORAL | 3 refills | Status: DC

## 2019-01-04 NOTE — Progress Notes (Addendum)
Assessment and Plan:  Beverly Davis was seen today for dizziness  Diagnoses and all orders for this visit:  Dizziness Multifactorial; unable to replicate in office, suspect hypoglycemia primarily due to mainly having in evenings Murmur - However with ? new significant murmur, edema, ? LVH on EKG - refer to cardiology for ECHO Orthostatics unremarkable  Hypoglycemia - Start checking glucose at home and BP when unwell  Clarify how taking glipizide - Beverly Davis will monitor closely, keep log, STOP glipizide if only taking once daily, close follow up if having persistent hypoglycemia  Hepatic encephalopathy - Very difficult to evaluate, struggles finger to nose/heel to shin however no pronator drift, ? Difficulty following instructions rather than abnormal neuro, poor focus ongoing since liver cancer and chemo;  Will check ammonia levels, LFTs, renal function/UA due to diarrhea Defer brain imaging for now, consider if persistent  Recommend close follow up in 3-7 days; PATIENT DECLINES TO SCHEDULE; WILL CALL IF NOT IMPROVING The patient was advised to call immediately if she has any concerning symptoms in the interval. The patient voices understanding of current treatment options and is in agreement with the current care plan.The patient knows to call the clinic with any problems, questions or concerns or go to the ER if any further progression of symptoms.  -     CBC with Differential/Platelet -     COMPLETE METABOLIC PANEL WITH GFR -     EKG 12-Lead -     Urinalysis w microscopic + reflex cultur -     Orthostatic vital signs -     Ambulatory referral to Cardiology -     glipiZIDE (GLUCOTROL) 5 MG tablet; Take 1/2-1 tablet 3 x /day with meals for Diabetes. Do not take if not eaing; take only 1/2 tab for small meals.  Liver cirrhosis secondary to NASH (HCC)/ hx of hepatic encephalopathy -     COMPLETE METABOLIC PANEL WITH GFR -     Ammonia  Murmur/ Ankle edema, bilateral 3/6 blowing systolic murmur  with radiation to neck, bil ankle edema ? New LVH on EKG, otherwise unchanged IRBBB, LAD Referring to cardiology for evaluation and ECHO -     Ambulatory referral to Cardiology  Further disposition pending results of labs. Discussed med's effects and SE's.   Over 30 minutes of exam, counseling, chart review, and critical decision making was performed.   Future Appointments  Date Time Provider [place]  01/15/2019 10:30 AM [facility] LAB [facility] None  01/15/2019 10:45 AM [facility] INJ NURSE [facility] None  01/15/2019 11:15 AM [place], Holli Humbles, NP [facility] None  01/15/2019 12:00 PM [facility] B2 [facility] None  02/10/2019  3:30 PM Unk Pinto, MD [facility] None  05/19/2019  3:00 PM Liane Comber, NP [facility] None  09/02/2019 10:00 AM Unk Pinto, MD [facility] None    ------------------------------------------------------------------------------------------------------------------   HPI BP 124/64   Pulse 83   Temp 97.9 F (36.6 C)   Ht 5' 2"  (1.575 m)   Wt 154 lb (69.9 kg)   SpO2 98%   BMI 28.17 kg/m   78 y.o.female with hx of htn (not recently on medications), NASH/cirrhosis/hepatoma, hepatic encephalopathy, T2DM, presents for evaluation of dizziness that began last week; she is accompanied by her Beverly Davis today who does not typically accompany her. She is difficult to interview, hx of NASH/cirrhosis, hepatic encephalopathy on lactulose, poor focus at baseline;   She reports became very dizzy and fell at night/early morning last week on 10/6 and fell against a wall going to the bathroom;  she denies head injury with this, denies neck pain; she does report some lower back pain and R hip pain but reports this is improving.   Tried meclizine over the weekend which was not helpful   She eports has been having frequent dizzy episodes over the last week or two, primarily in the evening; becomes very dizzy when getting up at night to urinate, but also reports may have when  lying supine. She reports worse with neck flexion or extension though is unable to replicate in office. Denies with neck rotation. Does endorse some dizziness intermittently with sitting to standing. Denies URI sx, allergies but endorses poor hearing and occasionally "sounds like I'm in a cave."   She also reports concern with low glucose values; checks fasting religiously and apparently has had some values in the 70s; she denies dizziness during the day, or episodes of sweats or blurry vision. Beverly Davis has noted shaking in bilateral hands; he feels this may all be related to her blood sugars. She has not checked blood sugar or BP when feeling dizzy. She is prescribed metformi, januvia, glipizide for diabetes. She is inconsistent in her reports of how she takes glipizide; initially confirms 5 mg TID, then later states takes only 5 mg in AM, later states doesn't take on Saturdays.   She does not check BPs at home, today their BP is BP: 124/64 She is not on BP medications at this time.   She does not workout. She denies chest pain, shortness of breath.   She reports taking lactulose 1 tablespoon 4 times daily; she follows with Dr. Marin Olp and Dr. Carol Ada. Reports does have frequent watery stools with lactulose    Last A1C in the office was:  Lab Results  Component Value Date   HGBA1C 6.3 (H) 12/04/2018    Past Medical History:  Diagnosis Date  . Breast cancer ([place]) 05/2006   Right breast cancer  . Cirrhosis ([place])   . Dementia ([place])   . Diabetes mellitus without complication ([place])    type 2  . Fibroids    PT IS UNAWARE   . Goals of care, counseling/discussion 09/17/2018  . Hepatocellular carcinoma ([place]) 05/08/2016  . Hypothyroidism   . Osteopenia   . Personal history of radiation therapy 2008  . Thrombocytopenia ([place])   . Thyroid disease      Allergies  Allergen Reactions  . Metformin And Related Other (See Comments)    Unknown  . Welchol [Colesevelam Hcl] Other (See Comments)     Unknown    Current Outpatient Medications on File Prior to Visit  Medication Sig  . Cholecalciferol (VITAMIN D3) 5000 units CAPS Take 5,000 Units by mouth daily.  . cyanocobalamin 1000 MCG tablet Take 1,000 mcg by mouth daily.  Marland Kitchen lactulose (CHRONULAC) 10 GM/15ML solution Take 30 mLs (20 g total) by mouth 3 (three) times daily. (Patient taking differently: Take by mouth daily. Takes 20 mls. Daily.)  . Lancets (ONETOUCH DELICA PLUS [id number]) MISC   . levothyroxine (SYNTHROID) 112 MCG tablet Take 1 tablet daily on an empty stomach with only water for 30 minutes & no Antacid meds, Calcium or Magnesium for 4 hours & avoid Biotin  . magnesium oxide (MAG-OX) 400 MG tablet Take 400 mg by mouth daily.   . metFORMIN (GLUCOPHAGE) 500 MG tablet Take 1 tablet with Breakfast & Lunch and 2 tablets with supper for Diabetes  . ONETOUCH ULTRA test strip USE 1 STRIP TO CHECK GLUCOSE ONCE DAILY  . sitaGLIPtin (JANUVIA)  100 MG tablet Take 100 mg by mouth daily.  Marland Kitchen lidocaine-prilocaine (EMLA) cream Apply to affected area once (Patient not taking: Reported on 01/04/2019)  . ondansetron (ZOFRAN) 8 MG tablet Take 1 tablet (8 mg total) by mouth 2 (two) times daily as needed (Nausea or vomiting). (Patient not taking: Reported on 01/04/2019)  . prochlorperazine (COMPAZINE) 10 MG tablet Take 1 tablet (10 mg total) by mouth every 6 (six) hours as needed (Nausea or vomiting). (Patient not taking: Reported on 01/04/2019)   No current facility-administered medications on file prior to visit.     ROS: all negative except above.   Physical Exam:  BP 124/64   Pulse 83   Temp 97.9 F (36.6 C)   Ht 5' 2"  (1.575 m)   Wt 154 lb (69.9 kg)   SpO2 98%   BMI 28.17 kg/m   General Appearance: Well nourished, well dressed, in no apparent distress. Eyes: PERRLA, EOMs, conjunctiva no swelling or erythema Sinuses: No Frontal/maxillary tenderness ENT/Mouth: Ext aud canals clear, TMs without erythema, bulging. No erythema,  swelling, or exudate on post pharynx.  Tonsils not swollen or erythematous. Hearing normal.  Neck: Supple, thyroid normal.  Respiratory: Respiratory effort normal, BS equal bilaterally without rales, rhonchi, wheezing or stridor.  Cardio: RRR with 3/6 blowing systolic murmur loudest over R 2nd ICS with radiation to neck. Brisk peripheral pulses with 2+ pitting edema to ankles.  Abdomen: Soft, + BS.  Non tender, no guarding, rebound, hernias, palpable masses. Lymphatics: Non tender without lymphadenopathy.  Musculoskeletal: Symmetrical strength, slow steady gait. No palpable bony abnormality, no tenderness over spine;  Skin: Warm, dry without rashes, lesions, ecchymosis.  Neuro: Cranial nerves intact. Normal muscle tone, no pronator drift; she struggles with finger to nose, heel to shin. Gait very slow. Sensation intact. Reflexes diminished throughout but symmetrical. Psych: Awake and oriented X 3, poor focus (chronic), difficulty following complex directions, flat affect, Insight and Judgment fair     Izora Ribas, NP 5:45 PM [facility]

## 2019-01-04 NOTE — Patient Instructions (Addendum)
I am concerned your dizziness may be caused by low blood sugar at night  Please reduce glipizide (diabetes medication) - take only 1/2 tab if eating a small meal; don't take at all at night or if not eating during the day  Please check blood sugar at night if feeling dizzy   Please pick up a blood pressure monitor and check blood pressure at night when dizzy  Please drink plenty of fluids; 65+ fluid ounces daily    I will plan to refer you to cardiology to get the new murmur checked out    Hypoglycemia Hypoglycemia is when the sugar (glucose) level in your blood is too low. Signs of low blood sugar may include:  Feeling: ? Hungry. ? Worried or nervous (anxious). ? Sweaty and clammy. ? Confused. ? Dizzy. ? Sleepy. ? Sick to your stomach (nauseous).  Having: ? A fast heartbeat. ? A headache. ? A change in your vision. ? Tingling or no feeling (numbness) around your mouth, lips, or tongue. ? Jerky movements that you cannot control (seizure).  Having trouble with: ? Moving (coordination). ? Sleeping. ? Passing out (fainting). ? Getting upset easily (irritability). Low blood sugar can happen to people who have diabetes and people who do not have diabetes. Low blood sugar can happen quickly, and it can be an emergency. Treating low blood sugar Low blood sugar is often treated by eating or drinking something sugary right away, such as:  Fruit juice, 4-6 oz (120-150 mL).  Regular soda (not diet soda), 4-6 oz (120-150 mL).  Low-fat milk, 4 oz (120 mL).  Several pieces of hard candy.  Sugar or honey, 1 Tbsp (15 mL). Treating low blood sugar if you have diabetes If you can think clearly and swallow safely, follow the 15:15 rule:  Take 15 grams of a fast-acting carb (carbohydrate). Talk with your doctor about how much you should take.  Always keep a source of fast-acting carb with you, such as: ? Sugar tablets (glucose pills). Take 3-4 pills. ? 6-8 pieces of hard candy.  ? 4-6 oz (120-150 mL) of fruit juice. ? 4-6 oz (120-150 mL) of regular (not diet) soda. ? 1 Tbsp (15 mL) honey or sugar.  Check your blood sugar 15 minutes after you take the carb.  If your blood sugar is still at or below 70 mg/dL (3.9 mmol/L), take 15 grams of a carb again.  If your blood sugar does not go above 70 mg/dL (3.9 mmol/L) after 3 tries, get help right away.  After your blood sugar goes back to normal, eat a meal or a snack within 1 hour.  Treating very low blood sugar If your blood sugar is at or below 54 mg/dL (3 mmol/L), you have very low blood sugar (severe hypoglycemia). This may also cause:  Passing out.  Jerky movements you cannot control (seizure).  Losing consciousness (coma). This is an emergency. Do not wait to see if the symptoms will go away. Get medical help right away. Call your local emergency services (911 in the [place] Do not drive yourself to the hospital. If you have very low blood sugar and you cannot eat or drink, you may need a glucagon shot (injection). A family member or friend should learn how to check your blood sugar and how to give you a glucagon shot. Ask your doctor if you need to have a glucagon shot kit at home. Follow these instructions at home: General instructions  Take over-the-counter and prescription medicines only as  told by your doctor.  Stay aware of your blood sugar as told by your doctor.  Limit alcohol intake to no more than 1 drink a day for nonpregnant women and 2 drinks a day for men. One drink equals 12 oz of beer (355 mL), 5 oz of wine (148 mL), or 1 oz of hard liquor (44 mL).  Keep all follow-up visits as told by your doctor. This is important. If you have diabetes:   Follow your diabetes care plan as told by your doctor. Make sure you: ? Know the signs of low blood sugar. ? Take your medicines as told. ? Follow your exercise and meal plan. ? Eat on time. Do not skip meals. ? Check your blood sugar as often as  told by your doctor. Always check it before and after exercise. ? Follow your sick day plan when you cannot eat or drink normally. Make this plan ahead of time with your doctor.  Share your diabetes care plan with: ? Your work or school. ? People you live with.  Check your pee (urine) for ketones: ? When you are sick. ? As told by your doctor.  Carry a card or wear jewelry that says you have diabetes. Contact a doctor if:  You have trouble keeping your blood sugar in your target range.  You have low blood sugar often. Get help right away if:  You still have symptoms after you eat or drink something sugary.  Your blood sugar is at or below 54 mg/dL (3 mmol/L).  You have jerky movements that you cannot control.  You pass out. These symptoms may be an emergency. Do not wait to see if the symptoms will go away. Get medical help right away. Call your local emergency services (911 in the [place] Do not drive yourself to the hospital. Summary  Hypoglycemia happens when the level of sugar (glucose) in your blood is too low.  Low blood sugar can happen to people who have diabetes and people who do not have diabetes. Low blood sugar can happen quickly, and it can be an emergency.  Make sure you know the signs of low blood sugar and know how to treat it.  Always keep a source of sugar (fast-acting carb) with you to treat low blood sugar. This information is not intended to replace advice given to you by your health care provider. Make sure you discuss any questions you have with your health care provider. Document Released: 06/05/2009 Document Revised: 07/02/2018 Document Reviewed: 04/14/2015 Elsevier Patient Education  [place].       Dizziness Dizziness is a common problem. It makes you feel unsteady or light-headed. You may feel like you are about to pass out (faint). Dizziness can lead to getting hurt if you stumble or fall. Dizziness can be caused by many things,  including:  Medicines.  Not having enough water in your body (dehydration).  Illness. Follow these instructions at home: Eating and drinking   Drink enough fluid to keep your pee (urine) clear or pale yellow. This helps to keep you from getting dehydrated. Try to drink more clear fluids, such as water.  Do not drink alcohol.  Limit how much caffeine you drink or eat, if your doctor tells you to do that.  Limit how much salt (sodium) you drink or eat, if your doctor tells you to do that. Activity   Avoid making quick movements. ? When you stand up from sitting in a chair, steady yourself until  you feel okay. ? In the morning, first sit up on the side of the bed. When you feel okay, stand slowly while you hold onto something. Do this until you know that your balance is fine.  If you need to stand in one place for a long time, move your legs often. Tighten and relax the muscles in your legs while you are standing.  Do not drive or use heavy machinery if you feel dizzy.  Avoid bending down if you feel dizzy. Place items in your home so you can reach them easily without leaning over. Lifestyle  Do not use any products that contain nicotine or tobacco, such as cigarettes and e-cigarettes. If you need help quitting, ask your doctor.  Try to lower your stress level. You can do this by using methods such as yoga or meditation. Talk with your doctor if you need help. General instructions  Watch your dizziness for any changes.  Take over-the-counter and prescription medicines only as told by your doctor. Talk with your doctor if you think that you are dizzy because of a medicine that you are taking.  Tell a friend or a family member that you are feeling dizzy. If he or she notices any changes in your behavior, have this person call your doctor.  Keep all follow-up visits as told by your doctor. This is important. Contact a doctor if:  Your dizziness does not go away.  Your  dizziness or light-headedness gets worse.  You feel sick to your stomach (nauseous).  You have trouble hearing.  You have new symptoms.  You are unsteady on your feet.  You feel like the room is spinning. Get help right away if:  You throw up (vomit) or have watery poop (diarrhea), and you cannot eat or drink anything.  You have trouble: ? Talking. ? Walking. ? Swallowing. ? Using your arms, hands, or legs.  You feel generally weak.  You are not thinking clearly, or you have trouble forming sentences. A friend or family member may notice this.  You have: ? Chest pain. ? Pain in your belly (abdomen). ? Shortness of breath. ? Sweating.  Your vision changes.  You are bleeding.  You have a very bad headache.  You have neck pain or a stiff neck.  You have a fever. These symptoms may be an emergency. Do not wait to see if the symptoms will go away. Get medical help right away. Call your local emergency services (911 in the [place] Do not drive yourself to the hospital. Summary  Dizziness makes you feel unsteady or light-headed. You may feel like you are about to pass out (faint).  Drink enough fluid to keep your pee (urine) clear or pale yellow. Do not drink alcohol.  Avoid making quick movements if you feel dizzy.  Watch your dizziness for any changes. This information is not intended to replace advice given to you by your health care provider. Make sure you discuss any questions you have with your health care provider. Document Released: 02/28/2011 Document Revised: 03/14/2017 Document Reviewed: 03/28/2016 Elsevier Patient Education  2020 [employer].

## 2019-01-05 ENCOUNTER — Telehealth: Payer: Self-pay | Admitting: Cardiology

## 2019-01-05 LAB — URINALYSIS W MICROSCOPIC + REFLEX CULTURE
Bacteria, UA: NONE SEEN /HPF
Bilirubin Urine: NEGATIVE
Hgb urine dipstick: NEGATIVE
Hyaline Cast: NONE SEEN /LPF
Leukocyte Esterase: NEGATIVE
Nitrites, Initial: NEGATIVE
Protein, ur: NEGATIVE
RBC / HPF: NONE SEEN /HPF (ref 0–2)
Specific Gravity, Urine: 1.033 (ref 1.001–1.03)
Squamous Epithelial / HPF: NONE SEEN /HPF (ref <=5)
WBC, UA: NONE SEEN /HPF (ref 0–5)
pH: <=5 (ref 5.0–8.0)

## 2019-01-05 LAB — COMPLETE METABOLIC PANEL WITH GFR
AG Ratio: 1.1 (calc) (ref 1.0–2.5)
ALT: 44 U/L — ABNORMAL HIGH (ref 6–29)
AST: 64 U/L — ABNORMAL HIGH (ref 10–35)
Albumin: 3.1 g/dL — ABNORMAL LOW (ref 3.6–5.1)
Alkaline phosphatase (APISO): 124 U/L (ref 37–153)
BUN: 15 mg/dL (ref 7–25)
CO2: 18 mmol/L — ABNORMAL LOW (ref 20–32)
Calcium: 9.4 mg/dL (ref 8.6–10.4)
Chloride: 106 mmol/L (ref 98–110)
Creat: 0.71 mg/dL (ref 0.60–0.93)
GFR, Est African American: 95 mL/min/{1.73_m2} (ref >=60)
GFR, Est Non African American: 82 mL/min/{1.73_m2} (ref >=60)
Globulin: 2.8 g/dL (calc) (ref 1.9–3.7)
Glucose, Bld: 253 mg/dL — ABNORMAL HIGH (ref 65–99)
Potassium: 4.2 mmol/L (ref 3.5–5.3)
Sodium: 137 mmol/L (ref 135–146)
Total Bilirubin: 2.1 mg/dL — ABNORMAL HIGH (ref 0.2–1.2)
Total Protein: 5.9 g/dL — ABNORMAL LOW (ref 6.1–8.1)

## 2019-01-05 LAB — CBC WITH DIFFERENTIAL/PLATELET
Absolute Monocytes: 268 cells/uL (ref 200–950)
Basophils Absolute: 20 cells/uL (ref 0–200)
Basophils Relative: 0.9 %
Eosinophils Absolute: 59 cells/uL (ref 15–500)
Eosinophils Relative: 2.7 %
HCT: 31.5 % — ABNORMAL LOW (ref 35.0–45.0)
Hemoglobin: 10.5 g/dL — ABNORMAL LOW (ref 11.7–15.5)
Lymphs Abs: 378 cells/uL — ABNORMAL LOW (ref 850–3900)
MCH: 35.7 pg — ABNORMAL HIGH (ref 27.0–33.0)
MCHC: 33.3 g/dL (ref 32.0–36.0)
MCV: 107.1 fL — ABNORMAL HIGH (ref 80.0–100.0)
MPV: 11.1 fL (ref 7.5–12.5)
Monocytes Relative: 12.2 %
Neutro Abs: 1474 cells/uL — ABNORMAL LOW (ref 1500–7800)
Neutrophils Relative %: 67 %
Platelets: 81 10*3/uL — ABNORMAL LOW (ref 140–400)
RBC: 2.94 10*6/uL — ABNORMAL LOW (ref 3.80–5.10)
RDW: 14.5 % (ref 11.0–15.0)
Total Lymphocyte: 17.2 %
WBC: 2.2 10*3/uL — ABNORMAL LOW (ref 3.8–10.8)

## 2019-01-05 LAB — NO CULTURE INDICATED

## 2019-01-05 LAB — AMMONIA: Ammonia: 105 umol/L — ABNORMAL HIGH (ref <=72)

## 2019-01-05 NOTE — Telephone Encounter (Signed)
LVM for patient to call back and schedule a new patient appt.

## 2019-01-06 ENCOUNTER — Other Ambulatory Visit: Payer: Self-pay | Admitting: Adult Health

## 2019-01-06 DIAGNOSIS — M25551 Pain in right hip: Secondary | ICD-10-CM

## 2019-01-06 DIAGNOSIS — M545 Low back pain, unspecified: Secondary | ICD-10-CM

## 2019-01-07 ENCOUNTER — Encounter: Payer: Self-pay | Admitting: Adult Health

## 2019-01-07 ENCOUNTER — Ambulatory Visit
Admission: RE | Admit: 2019-01-07 | Discharge: 2019-01-07 | Disposition: A | Discharge status: Home / Self Care | Payer: PPO | Source: Ambulatory Visit | Attending: Adult Health | Admitting: Adult Health

## 2019-01-07 DIAGNOSIS — M25551 Pain in right hip: Secondary | ICD-10-CM | POA: Diagnosis not present

## 2019-01-07 DIAGNOSIS — M545 Low back pain, unspecified: Secondary | ICD-10-CM

## 2019-01-07 DIAGNOSIS — S12090A Other displaced fracture of first cervical vertebra, initial encounter for closed fracture: Secondary | ICD-10-CM | POA: Insufficient documentation

## 2019-01-07 DIAGNOSIS — S79911A Unspecified injury of right hip, initial encounter: Secondary | ICD-10-CM | POA: Diagnosis not present

## 2019-01-08 ENCOUNTER — Other Ambulatory Visit: Payer: Self-pay | Admitting: Internal Medicine

## 2019-01-08 ENCOUNTER — Other Ambulatory Visit: Payer: Self-pay | Admitting: Adult Health

## 2019-01-11 ENCOUNTER — Telehealth: Payer: Self-pay

## 2019-01-11 ENCOUNTER — Telehealth: Payer: Self-pay | Admitting: *Deleted

## 2019-01-11 ENCOUNTER — Other Ambulatory Visit: Payer: Self-pay | Admitting: Adult Health

## 2019-01-11 ENCOUNTER — Encounter: Payer: Self-pay | Admitting: Adult Health

## 2019-01-11 DIAGNOSIS — K7290 Hepatic failure, unspecified without coma: Secondary | ICD-10-CM | POA: Diagnosis not present

## 2019-01-11 DIAGNOSIS — R3981 Functional urinary incontinence: Secondary | ICD-10-CM

## 2019-01-11 DIAGNOSIS — Z9181 History of falling: Secondary | ICD-10-CM

## 2019-01-11 DIAGNOSIS — K7460 Unspecified cirrhosis of liver: Secondary | ICD-10-CM | POA: Diagnosis not present

## 2019-01-11 DIAGNOSIS — C229 Malignant neoplasm of liver, not specified as primary or secondary: Secondary | ICD-10-CM | POA: Diagnosis not present

## 2019-01-11 NOTE — Telephone Encounter (Signed)
Message received from patient requesting a call back regarding her appts on 01/15/19.  Call placed back to patient and patient states that she fell this past week and would like to know if she still needs her treatment on Friday.  Pt instructed to come in as scheduled for appts on 01/15/19 if she is able.  Pt appreciative of call back and states that she will be here for scheduled appts on 01/15/19.

## 2019-01-11 NOTE — Telephone Encounter (Signed)
Patient aware. Already has a bedside commode, she will try and get that out to utilize.

## 2019-01-11 NOTE — Telephone Encounter (Signed)
Patient states that she was better over the weekend, just not making it to the bathroom quick enough at night. Please advise.

## 2019-01-12 ENCOUNTER — Other Ambulatory Visit: Payer: PPO

## 2019-01-12 ENCOUNTER — Ambulatory Visit: Payer: PPO

## 2019-01-12 ENCOUNTER — Ambulatory Visit: Payer: PPO | Admitting: Family

## 2019-01-13 ENCOUNTER — Encounter: Payer: Self-pay | Admitting: *Deleted

## 2019-01-13 NOTE — Progress Notes (Signed)
Assessment and Plan:  Beverly Davis was seen today for dizziness  Diagnoses and all orders for this visit:  Dizziness/fatigue Multifactorial; she reports improved today  Murmur - However with new significant systolic murmur which is persistent, though radiation to neck is resolved since last visit; persistent bilateral ankle edema, ? LVH on EKG - referred to cardiology; reminded her to call back Dr. Rosezella [place] office to schedule appointment  T2DM/Hypoglycemia - Glucose log today demonstrates fasting values ranging 99-127; she has reduced glipizide to 1/2 tab in AM and reports feeling much better Continue to monitor closely, keep log, check glucose any time she is feeling unwell Hepatic encephalopathy - focus improved today; she has poor focus and decreased cognition ongoing since liver cancer and chemo; she is currently on xifaxin samples in addition to lactulose via Dr. Benson Norway with notable improvement, though has been unable to afford with attempts to prescribe in the past and concern with financial sustainability. ? Possible financial assistance via cancer center -   Mild compression fracture of L1 Patient reports she is managing well with topical analgesic solutions that were recommended (lidocaine patches, topical voltaren) Avoid heavy lifting, twisting, aggressive exercise Recommended back brace to use as needed Follow up if not improving or any worsening pain  Further disposition pending results of labs. Discussed med's effects and SE's.   Over 30 minutes of exam, counseling, chart review, and critical decision making was performed.   Future Appointments  Date Time Provider [place]  01/15/2019 10:30 AM [facility] LAB [facility] None  01/15/2019 10:45 AM [facility] INJ NURSE [facility] None  01/15/2019 11:15 AM [place], Holli Humbles, NP [facility] None  01/15/2019 12:00 PM [facility] B2 [facility] None  02/10/2019  3:30 PM Unk Pinto, MD [facility] None  05/19/2019  3:00 PM Liane Comber, NP  [facility] None  09/02/2019 10:00 AM Unk Pinto, MD [facility] None    ------------------------------------------------------------------------------------------------------------------   HPI BP 120/74   Pulse (!) 102   Temp 97.7 F (36.5 C)   Ht 5' 2"  (1.575 m)   Wt 144 lb (65.3 kg)   SpO2 96%   BMI 26.34 kg/m   78 y.o.female with hx of htn (not recently on medications), NASH/cirrhosis/hepatoma, hepatic encephalopathy, T2DM, presents for follow up on dizziness/fatigue and also for glucose log review due to concern with possible hypoglycemia.   She reports she is feeling much better in the last several days   Her focus/mental clarity is much improved; at last visit ammonia level was checked and resulted 105 (improved from previous 150+); she has since seen GI provider Dr. Benson Norway and  is on xifaxin samples  in addition to lactulose.   At last visit a ? New 3/6 blowing murmur was noted with radiation to neck, EKG unchanged from previous excepting possible new LVH and she has been referred to cardiology Dr. Percival Spanish for evaluation and ECHO though appears has not yet scheduled.   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   She presents today for follow up with glucose log; at last visit she was very confused and unsure how she was taking glipizide; she reports she has reduced use and now only taking 1/2 tab in AM; log demonstrates  fasting ranging 99-127. She was advised to check in afternoon or evening if feeling unwell. She denies having any episodes and did not check.   She does not check BPs at home, today their BP is BP: 120/74 She is not on BP medications at this  time.   She does not workout. She denies chest pain, shortness of breath.    Last A1C in the office was:  Lab Results  Component Value Date   HGBA1C 6.3 (H) 12/04/2018   Lab Results  Component Value Date   NA 137 01/04/2019   K 4.2 01/04/2019   CL 106 01/04/2019   CO2 18 (L) 01/04/2019   GLUCOSE 253 (H) 01/04/2019   BUN 15 01/04/2019   CREATININE 0.71 01/04/2019   CALCIUM 9.4 01/04/2019   GFRAA 95 01/04/2019   GFRNONAA 82 01/04/2019   Lab Results  Component Value Date   ALT 44 (H) 01/04/2019   AST 64 (H) 01/04/2019   ALKPHOS 133 (H) 12/25/2018   BILITOT 2.1 (H) 01/04/2019      Past Medical History:  Diagnosis Date  . Breast cancer ([place]) 05/2006   Right breast cancer  . Cirrhosis ([place])   . Dementia ([place])   . Diabetes mellitus without complication ([place])    type 2  . Fibroids    PT IS UNAWARE   . Goals of care, counseling/discussion 09/17/2018  . Hepatocellular carcinoma ([place]) 05/08/2016  . Hypothyroidism   . Osteopenia   . Personal history of radiation therapy 2008  . Thrombocytopenia ([place])   . Thyroid disease      Allergies  Allergen Reactions  . Metformin And Related Other (See Comments)    Unknown  . Welchol [Colesevelam Hcl] Other (See Comments)    Unknown    Current Outpatient Medications on File Prior to Visit  Medication Sig  . Cholecalciferol (VITAMIN D3) 5000 units CAPS Take 5,000 Units by mouth daily.  . cyanocobalamin 1000 MCG tablet Take 1,000 mcg by mouth daily.  Marland Kitchen glipiZIDE (GLUCOTROL) 5 MG tablet Take 1/2-1 tablet 3 x /day with meals for Diabetes. Do not take if not eaing; take only 1/2 tab for small meals.  Marland Kitchen glucose blood (ONETOUCH ULTRA) test strip Check blood Sugar 3 x  /day before Meals  . lactulose (CHRONULAC) 10 GM/15ML solution Take 30 mLs (20 g total) by mouth 3 (three) times daily. (Patient taking differently: Take by mouth daily. Takes 20 mls. Daily.)  . Lancets (ONETOUCH DELICA PLUS [id number]) MISC 300 Wafers by Does not apply  route 3 (three) times daily before meals. Check blood sugar 3 x  /day  . levothyroxine (SYNTHROID) 112 MCG tablet Take 1 tablet daily on an empty stomach with only water for 30 minutes & no Antacid meds, Calcium or Magnesium for 4 hours & avoid Biotin  . lidocaine-prilocaine (EMLA) cream Apply to affected area once  . magnesium oxide (MAG-OX) 400 MG tablet Take 400 mg by mouth daily.   . metFORMIN (GLUCOPHAGE) 500 MG tablet Take 1 tablet with Breakfast & Lunch and 2 tablets with supper for Diabetes  . ondansetron (ZOFRAN) 8 MG tablet Take 1 tablet (8 mg total) by mouth 2 (two) times daily as needed (Nausea or vomiting).  . prochlorperazine (COMPAZINE) 10 MG tablet Take 1 tablet (10 mg total) by mouth every 6 (six) hours as needed (Nausea or vomiting).  . sitaGLIPtin (JANUVIA) 100 MG tablet Take 100 mg by mouth daily.   No current facility-administered medications on file prior to visit.  ROS: all negative except above.   Physical Exam:  BP 120/74   Pulse (!) 102   Temp 97.7 F (36.5 C)   Ht 5' 2"  (1.575 m)   Wt 144 lb (65.3 kg)   SpO2 96%   BMI 26.34 kg/m   General Appearance: Well nourished, well dressed, in no apparent distress. Eyes: PERRLA, EOMs, conjunctiva no swelling or erythema Sinuses: No Frontal/maxillary tenderness ENT/Mouth: Ext aud canals clear, TMs without erythema, bulging. No erythema, swelling, or exudate on post pharynx.  Tonsils not swollen or erythematous. Hearing normal.  Neck: Supple, thyroid normal.  Respiratory: Respiratory effort normal, BS equal bilaterally without rales, rhonchi, wheezing or stridor.  Cardio: RRR with 3/6 blowing systolic murmur loudest over R 2nd ICS with radiation to neck. Brisk peripheral pulses with 2+ pitting edema to ankles.  Abdomen: Soft, + BS.  Non tender, no guarding, rebound, hernias, palpable masses. Lymphatics: Non tender without lymphadenopathy.  Musculoskeletal: Symmetrical strength, slow steady gait. No palpable bony  abnormality, no tenderness over spine;  Skin: Warm, dry without rashes, lesions, ecchymosis.  Neuro: Cranial nerves intact. Normal muscle tone, no pronator drift; she struggles with finger to nose, heel to shin. Gait very slow. Sensation intact. Reflexes diminished throughout but symmetrical. Psych: Awake and oriented X 3, poor focus (chronic), difficulty following complex directions, flat affect, Insight and Judgment fair     Izora Ribas, NP 10:58 AM [facility]

## 2019-01-14 ENCOUNTER — Other Ambulatory Visit: Payer: Self-pay

## 2019-01-14 ENCOUNTER — Ambulatory Visit (INDEPENDENT_AMBULATORY_CARE_PROVIDER_SITE_OTHER): Payer: PPO | Admitting: Adult Health

## 2019-01-14 ENCOUNTER — Encounter: Payer: Self-pay | Admitting: Adult Health

## 2019-01-14 VITALS — BP 120/74 | HR 102 | Temp 97.7°F | Ht 62.0 in | Wt 144.0 lb

## 2019-01-14 DIAGNOSIS — E1122 Type 2 diabetes mellitus with diabetic chronic kidney disease: Secondary | ICD-10-CM

## 2019-01-14 DIAGNOSIS — R011 Cardiac murmur, unspecified: Secondary | ICD-10-CM

## 2019-01-14 DIAGNOSIS — Z23 Encounter for immunization: Secondary | ICD-10-CM

## 2019-01-14 DIAGNOSIS — R5383 Other fatigue: Secondary | ICD-10-CM

## 2019-01-14 DIAGNOSIS — R42 Dizziness and giddiness: Secondary | ICD-10-CM

## 2019-01-14 DIAGNOSIS — S12090D Other displaced fracture of first cervical vertebra, subsequent encounter for fracture with routine healing: Secondary | ICD-10-CM

## 2019-01-14 NOTE — Patient Instructions (Addendum)
You have a new heart murmur - systolic, moderately loud; because you also have swelling in your legs, fatigue and have fallen due to dizziness recently, I recommend you see a cardiologist (heart doctor) for them to check it out   I have referred to you Dr. Percival Spanish    Ask your cancer provider - Scherrie Bateman - what she thinks tomorrow   You have a small compression fracture in your back at L1 - it is mild  Can get a back brace if needed      Heart Murmur A heart murmur is an extra sound that is caused by chaotic blood flow through the valves of the heart. The murmur can be heard as a "hum" or "whoosh" sound when blood flows through the heart. There are two types of heart murmurs:  Innocent (benign) murmurs. Most people with this type of heart murmur do not have a heart problem. Many children have innocent heart murmurs. Your health care provider may suggest some basic tests to find out whether your murmur is an innocent murmur. If an innocent heart murmur is found, there is no need for further tests or treatment and no need to restrict activities or stop playing sports.  Abnormal murmurs. These types of murmurs can occur in children and adults. Abnormal murmurs may be a sign of a more serious heart condition, such as a heart defect present at birth (congenital defect) or heart valve disease. What are the causes?  The heart has four areas called chambers. Valves separate the upper and lower chambers from each other (tricuspid valve and mitral valve) and separate the lower chambers of the heart from pathways that lead away from the heart (aortic valve and pulmonary valve). Normally, the valves open to let blood flow through or out of your heart, and then they shut to keep the blood from flowing backward. This condition is caused by heart valves that are not working properly.  In children, abnormal heart murmurs are typically caused by congenital defects.  In adults, abnormal  murmurs are usually caused by heart valve problems from disease, infection, or aging. This condition may also be caused by:  Pregnancy.  Fever.  Overactive thyroid gland.  Anemia.  Exercise.  Rapid growth spurts (in children). What are the signs or symptoms? Innocent murmurs do not cause symptoms, and many people with abnormal murmurs may not have symptoms. If symptoms do develop, they may include:  Shortness of breath.  Blue coloring of the skin, especially on the fingertips.  Chest pain.  Palpitations, or feeling a fluttering or skipped heartbeat.  Fainting.  Persistent cough.  Getting tired much faster than expected.  Swelling in the abdomen, feet, or ankles. How is this diagnosed? This condition may be diagnosed during a routine physical or other exam. If your health care provider hears a murmur with a stethoscope, he or she will listen for:  Where the murmur is located in your heart.  How long the murmur lasts (duration).  When the murmur is heard during the heartbeat.  How loud the murmur is. This may help the health care provider figure out what is causing the murmur. You may be referred to a heart specialist (cardiologist). You may also have other tests, including:  Electrocardiogram (ECG or EKG). This test measures the electrical activity of your heart.  Echocardiogram. This test uses high frequency sound waves to make pictures of your heart.  MRI or chest X-ray.  Cardiac catheterization. This test looks at  blood flow through the arteries around the heart. For children and adults who have an abnormal heart murmur and want to stay active, it is important to:  Complete testing.  Review test results.  Receive recommendations from your health care provider. If heart disease is present, it may not be safe to play or be active. How is this treated? Heart murmurs themselves do not need treatment. In some cases, a heart murmur may go away on its own. If an  underlying problem or disease is causing the murmur, you may need treatment. If treatment is needed, it will depend on the type and severity of the disease or heart problem causing the murmur. Treatment may include:  Medicine.  Surgery.  Dietary and lifestyle changes. Follow these instructions at home:  Talk with your health care provider before participating in sports or other activities that require a lot of effort and energy (are strenuous).  Learn as much as possible about your condition and any related diseases. Ask your health care provider if you may be at risk for any medical emergencies.  Talk with your health care provider about what symptoms you should look out for.  It is up to you to get your test results. Ask your health care provider, or the department that is doing the test, when your results will be ready.  Keep all follow-up visits as told by your health care provider. This is important. Contact a health care provider if:  You are frequently short of breath.  You feel more tired than usual.  You are having a hard time keeping up with normal activities or fitness routines.  You have swelling in your ankles or feet.  You notice that your heart often beats irregularly.  You develop any new symptoms. Get help right away if:  You have chest pain.  You are having trouble breathing.  You feel light-headed or you pass out.  Your symptoms suddenly get worse. These symptoms may represent a serious problem that is an emergency. Do not wait to see if the symptoms will go away. Get medical help right away. Call your local emergency services (911 in the [place] Do not drive yourself to the hospital. Summary  Normally, the heart valves open to let blood flow through or out of your heart, and then they shut to keep the blood from flowing backward.  A heart murmur is caused by heart valves that are not working properly.  You may need treatment if an underlying problem  or disease is causing the heart murmur. Treatment may include medicine, surgery, or dietary and lifestyle changes.  Talk with your health care provider before participating in sports or other activities that require a lot of effort and energy (are strenuous).  Talk with your health care provider about what symptoms you should watch out for. This information is not intended to replace advice given to you by your health care provider. Make sure you discuss any questions you have with your health care provider. Document Released: 04/18/2004 Document Revised: 09/02/2017 Document Reviewed: 09/02/2017 Elsevier Patient Education  [place].      Spinal Compression Fracture  A spinal compression fracture is a collapse of the bones that form the spine (vertebrae). With this type of fracture, the vertebrae become pushed (compressed) into a wedge shape. Most compression fractures happen in the middle or lower part of the spine. What are the causes? This condition may be caused by:  Thinning and loss of density in the bones (  osteoporosis). This is the most common cause.  A fall.  A car or motorcycle accident.  Cancer.  Trauma, such as a heavy, direct hit to the head or back. What increases the risk? You are more likely to develop this condition if:  You are 74 years or older.  You have osteoporosis.  You have certain types of cancer, including: ? Multiple myeloma. ? Lymphoma. ? Prostate cancer. ? Lung cancer. ? Breast cancer. What are the signs or symptoms? Symptoms of this condition include:  Severe pain.  Pain that gets worse over time.  Pain that is worse when you stand, walk, sit, or bend.  Sudden pain that is so bad that it is hard for you to move.  Bending or humping of the spine.  Gradual loss of height.  Numbness, tingling, or weakness in the back and legs.  Trouble walking. Your symptoms will depend on the cause of the fracture and how quickly it  develops. How is this diagnosed? This condition may be diagnosed based on symptoms, medical history, and a physical exam. During the physical exam, your health care provider may tap along the length of your spine to check for tenderness. Tests may be done to confirm the diagnosis. They may include:  A bone mineral density test to check for osteoporosis.  Imaging tests, such as a spine X-ray, CT scan, or MRI. How is this treated? Treatment for this condition depends on the cause and severity of the condition. Some fractures may heal on their own with supportive care. Treatment may include:  Pain medicine.  Rest.  A back brace.  Physical therapy exercises.  Medicine to strengthen bone.  Calcium and vitamin D supplements. Fractures that cause the back to become misshapen, cause nerve pain or weakness, or do not respond to other treatment may be treated with surgery. This may include:  Vertebroplasty. Bone cement is injected into the collapsed vertebrae to stabilize them.  Balloon kyphoplasty. The collapsed vertebrae are expanded with a balloon and then bone cement is injected into them.  Spinal fusion. The collapsed vertebrae are connected (fused) to normal vertebrae. Follow these instructions at home: Medicines  Take over-the-counter and prescription medicines only as told by your health care provider.  Do not drive or operate heavy machinery while taking prescription pain medicine.  If you are taking prescription pain medicine, take actions to prevent or treat constipation. Your health care provider may recommend that you: ? Drink enough fluid to keep your urine pale yellow. ? Eat foods that are high in fiber, such as fresh fruits and vegetables, whole grains, and beans. ? Limit foods that are high in fat and processed sugars, such as fried or sweet foods. ? Take an over-the-counter or prescription medicine for constipation. If you have a brace:  Wear the brace as told by  your health care provider. Remove it only as told by your health care provider.  Loosen the brace if your fingers or toes tingle, become numb, or turn cold and blue.  Keep the brace clean.  If the brace is not waterproof: ? Do not let it get wet. ? Cover it with a watertight covering when you take a bath or a shower. Managing pain, stiffness, and swelling   If directed, apply ice to the injured area: ? If you have a removable brace, remove it as told by your health care provider. ? Put ice in a plastic bag. ? Place a towel between your skin and the bag. ?  Leave the ice on for 30 minutes every two hours at first. Then apply the ice as needed. Activity  Rest as told by your health care provider. ? Avoid sitting for a long time without moving. Get up to take short walks every 1-2 hours. This is important to improve blood flow and breathing. Ask for help if you feel weak or unsteady.  Return to your normal activities as directed by your health care provider. Ask what activities are safe for you.  Do exercises to improve motion and strength in your back (physical therapy), as recommended by your health care provider.  Exercise regularly as directed by your health care provider. General instructions   Do not drink alcohol. Alcohol can interfere with your treatment.  Do not use any products that contain nicotine or tobacco, such as cigarettes and e-cigarettes. These can delay bone healing. If you need help quitting, ask your health care provider.  Keep all follow-up visits as told by your health care provider. This is important. It can help to prevent permanent injury, disability, and long-lasting (chronic) pain. Contact a health care provider if:  You have a fever.  You develop a cough that makes your pain worse.  Your pain medicine is not helping.  Your pain does not get better over time.  You cannot return to your normal activities as planned or expected. Get help right away  if:  Your pain is very bad and it suddenly gets worse.  You are unable to move any body part (paralysis) that is below the level of your injury.  You have numbness, tingling, or weakness in any body part that is below the level of your injury.  You cannot control your bladder or bowels. Summary  A spinal compression fracture is a collapse of the bones that form the spine (vertebrae).  With this type of fracture, the vertebrae become pushed (compressed) into a wedge shape.  Your symptoms and treatment will depend on the cause and severity of the fracture and how quickly it develops.  Some fractures may heal on their own with supportive care. Fractures that cause the back to become misshapen, cause nerve pain or weakness, or do not respond to other treatment may be treated with surgery. This information is not intended to replace advice given to you by your health care provider. Make sure you discuss any questions you have with your health care provider. Document Released: 03/11/2005 Document Revised: 05/07/2018 Document Reviewed: 04/22/2017 Elsevier Patient Education  2020 [employer].

## 2019-01-15 ENCOUNTER — Inpatient Hospital Stay: Payer: PPO

## 2019-01-15 ENCOUNTER — Inpatient Hospital Stay (HOSPITAL_BASED_OUTPATIENT_CLINIC_OR_DEPARTMENT_OTHER): Payer: PPO | Admitting: Family

## 2019-01-15 ENCOUNTER — Encounter: Payer: Self-pay | Admitting: Family

## 2019-01-15 ENCOUNTER — Other Ambulatory Visit: Payer: Self-pay

## 2019-01-15 VITALS — BP 130/61 | HR 81 | Temp 97.6°F | Resp 17 | Wt 141.0 lb

## 2019-01-15 DIAGNOSIS — Z5112 Encounter for antineoplastic immunotherapy: Secondary | ICD-10-CM | POA: Diagnosis not present

## 2019-01-15 DIAGNOSIS — E032 Hypothyroidism due to medicaments and other exogenous substances: Secondary | ICD-10-CM

## 2019-01-15 DIAGNOSIS — Z8505 Personal history of malignant neoplasm of liver: Secondary | ICD-10-CM

## 2019-01-15 DIAGNOSIS — C220 Liver cell carcinoma: Secondary | ICD-10-CM | POA: Diagnosis not present

## 2019-01-15 DIAGNOSIS — D509 Iron deficiency anemia, unspecified: Secondary | ICD-10-CM | POA: Diagnosis not present

## 2019-01-15 LAB — CMP (CANCER CENTER ONLY)
ALT: 43 U/L (ref 0–44)
AST: 65 U/L — ABNORMAL HIGH (ref 15–41)
Albumin: 3.2 g/dL — ABNORMAL LOW (ref 3.5–5.0)
Alkaline Phosphatase: 164 U/L — ABNORMAL HIGH (ref 38–126)
Anion gap: 11 (ref 5–15)
BUN: 18 mg/dL (ref 8–23)
CO2: 20 mmol/L — ABNORMAL LOW (ref 22–32)
Calcium: 9 mg/dL (ref 8.9–10.3)
Chloride: 107 mmol/L (ref 98–111)
Creatinine: 0.61 mg/dL (ref 0.44–1.00)
GFR, Est AFR Am: >60 mL/min (ref >60)
GFR, Estimated: >60 mL/min (ref >60)
Glucose, Bld: 119 mg/dL — ABNORMAL HIGH (ref 70–99)
Potassium: 4 mmol/L (ref 3.5–5.1)
Sodium: 138 mmol/L (ref 135–145)
Total Bilirubin: 2.1 mg/dL — ABNORMAL HIGH (ref 0.3–1.2)
Total Protein: 6 g/dL — ABNORMAL LOW (ref 6.5–8.1)

## 2019-01-15 LAB — CBC WITH DIFFERENTIAL (CANCER CENTER ONLY)
Abs Immature Granulocytes: 0.01 10*3/uL (ref 0.00–0.07)
Basophils Absolute: 0 10*3/uL (ref 0.0–0.1)
Basophils Relative: 1 %
Eosinophils Absolute: 0.1 10*3/uL (ref 0.0–0.5)
Eosinophils Relative: 4 %
HCT: 31.9 % — ABNORMAL LOW (ref 36.0–46.0)
Hemoglobin: 10.6 g/dL — ABNORMAL LOW (ref 12.0–15.0)
Immature Granulocytes: 0 %
Lymphocytes Relative: 14 %
Lymphs Abs: 0.3 10*3/uL — ABNORMAL LOW (ref 0.7–4.0)
MCH: 36.3 pg — ABNORMAL HIGH (ref 26.0–34.0)
MCHC: 33.2 g/dL (ref 30.0–36.0)
MCV: 109.2 fL — ABNORMAL HIGH (ref 80.0–100.0)
Monocytes Absolute: 0.3 10*3/uL (ref 0.1–1.0)
Monocytes Relative: 12 %
Neutro Abs: 1.6 10*3/uL — ABNORMAL LOW (ref 1.7–7.7)
Neutrophils Relative %: 69 %
Platelet Count: 74 10*3/uL — ABNORMAL LOW (ref 150–400)
RBC: 2.92 MIL/uL — ABNORMAL LOW (ref 3.87–5.11)
RDW: 16.6 % — ABNORMAL HIGH (ref 11.5–15.5)
WBC Count: 2.4 10*3/uL — ABNORMAL LOW (ref 4.0–10.5)
nRBC: 0 % (ref 0.0–0.2)

## 2019-01-15 MED ORDER — SODIUM CHLORIDE 0.9% FLUSH
10.0000 mL | INTRAVENOUS | Status: DC | PRN
  Administered 2019-01-15: 14:00:00 10 mL
  Filled 2019-01-15: qty 10

## 2019-01-15 MED ORDER — SODIUM CHLORIDE 0.9 % IV SOLN
1200.0000 mg | Freq: Once | INTRAVENOUS | Status: AC
  Administered 2019-01-15: 14:00:00 1200 mg via INTRAVENOUS
  Filled 2019-01-15: qty 20

## 2019-01-15 MED ORDER — SODIUM CHLORIDE 0.9 % IV SOLN
Freq: Once | INTRAVENOUS | Status: AC
  Administered 2019-01-15: 13:00:00 via INTRAVENOUS
  Filled 2019-01-15: qty 250

## 2019-01-15 MED ORDER — SODIUM CHLORIDE 0.9 % IV SOLN
15.2000 mg/kg | Freq: Once | INTRAVENOUS | Status: AC
  Administered 2019-01-15: 13:00:00 1000 mg via INTRAVENOUS
  Filled 2019-01-15: qty 32

## 2019-01-15 MED ORDER — HEPARIN SOD (PORK) LOCK FLUSH 100 UNIT/ML IV SOLN
500.0000 [IU] | Freq: Once | INTRAVENOUS | Status: AC | PRN
  Administered 2019-01-15: 14:00:00 500 [IU]
  Filled 2019-01-15: qty 5

## 2019-01-15 NOTE — Progress Notes (Signed)
Hematology and Oncology Follow Up Visit  DEANDRE STANSEL [id number] 08/12/1940 78 y.o. 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 Bienaime is here today for follow-up and treatment. She is doing fairly well but did have a fall this week while going to the bathroom and ended up with a compression fracture of L1. She is sore in that area. No syncope.  AFP earlier this month was back up to 323. We will see what today's result shows.  She states that she has had some dizziness off and on and that her PCP decreased her Glipizide to 1/2 tablet daily.  No fever, chills, n/v, rash, SOB, chest pain, palpitations, abdominal pain or changes in bowel or bladder habits.  She is taking Lactulose as needed for constipation.  She has pitting edema in both ankles 1-2 +. She is mildly anemic at 10.6 and albumin 3.2. Iron studies are pending. She may need replacement.  No tenderness, numbness or tingling.  Appetite comes and goes. She is doing her best to stay hydrated. Her weight is stable.   ECOG Performance Status: 1 - Symptomatic but completely ambulatory  Medications:  Allergies as of 01/15/2019      Reactions   Metformin And Related Other (See Comments)   Unknown   Welchol [colesevelam Hcl] Other (See Comments)   Unknown      Medication List       Accurate as of January 15, 2019 11:12 AM. If you have any questions, ask your nurse or doctor.        cyanocobalamin 1000 MCG tablet Take 1,000 mcg by mouth daily.   glipiZIDE 5 MG tablet Commonly known as: GLUCOTROL Take 1/2-1 tablet 3 x /day with meals for Diabetes. Do not take if not eaing;  take only 1/2 tab for small meals.   Januvia 100 MG tablet Generic drug: sitaGLIPtin Take 100 mg by mouth daily.   lactulose 10 GM/15ML solution Commonly known as: CHRONULAC Take 30 mLs (20 g total) by mouth 3 (three) times daily. What changed:   how much to take  when to take this  additional instructions   levothyroxine 112 MCG tablet Commonly known as: Synthroid Take 1 tablet daily on an empty stomach with only water for 30 minutes & no Antacid meds, Calcium or Magnesium for 4 hours & avoid Biotin   lidocaine-prilocaine cream Commonly known as: EMLA Apply to affected area once   magnesium oxide 400 MG tablet Commonly known as: MAG-OX Take 400 mg by mouth daily.   metFORMIN 500 MG tablet Commonly known as: Glucophage Take 1 tablet with Breakfast & Lunch and 2 tablets with supper for Diabetes   ondansetron 8 MG tablet Commonly known as: Zofran Take 1 tablet (8 mg total) by mouth 2 (two) times daily as needed (Nausea or vomiting).   OneTouch Delica Plus [id number] Misc 300 Wafers by Does not apply route 3 (three) times daily before meals. Check blood sugar 3 x  /day   OneTouch Ultra test strip Generic drug: glucose blood Check blood Sugar 3 x  /day before Meals   prochlorperazine 10 MG tablet Commonly known as: COMPAZINE Take 1 tablet (10 mg total) by mouth every 6 (six) hours as  needed (Nausea or vomiting).   Vitamin D3 125 MCG (5000 UT) Caps Take 5,000 Units by mouth daily.       Allergies:  Allergies  Allergen Reactions  . Metformin And Related Other (See Comments)    Unknown  . Welchol [Colesevelam Hcl] Other (See Comments)    Unknown    Past Medical History, Surgical history, Social history, and Family History were reviewed and updated.  Review of Systems: All other 10 point review of systems is negative.   Physical Exam:  weight is 141 lb (64 kg). Her temporal temperature is 97.6 F (36.4 C). Her blood pressure is 130/61 and her pulse is 81.  Her respiration is 17 and oxygen saturation is 96%.   Wt Readings from Last 3 Encounters:  01/15/19 141 lb (64 kg)  01/14/19 144 lb (65.3 kg)  01/04/19 154 lb (69.9 kg)    Ocular: Sclerae unicteric, pupils equal, round and reactive to light Ear-nose-throat: Oropharynx clear, dentition fair Lymphatic: No cervical or supraclavicular adenopathy Lungs no rales or rhonchi, good excursion bilaterally Heart regular rate and rhythm, no murmur appreciated Abd soft, nontender, positive bowel sounds, no liver or spleen tip palpated on exam, no fluid wave  MSK no focal spinal tenderness, no joint edema Neuro: non-focal, well-oriented, appropriate affect Breasts: Deferred   Lab Results  Component Value Date   WBC 2.2 (L) 01/04/2019   HGB 10.5 (L) 01/04/2019   HCT 31.5 (L) 01/04/2019   MCV 107.1 (H) 01/04/2019   PLT 81 (L) 01/04/2019   Lab Results  Component Value Date   FERRITIN 56 12/25/2018   IRON 81 12/25/2018   TIBC 351 12/25/2018   UIBC 270 12/25/2018   IRONPCTSAT 23 12/25/2018   Lab Results  Component Value Date   RETICCTPCT 1.2 09/05/2015   RBC 2.94 (L) 01/04/2019   RETICCTABS 40,080 09/05/2015   No results found for: KPAFRELGTCHN, LAMBDASER, KAPLAMBRATIO No results found for: Kandis Cocking, [facility] Lab Results  Component Value Date   TOTALPROTELP 7.1 02/03/2008   ALBUMINELP 54.8 (L) 02/03/2008   A1GS 4.2 02/03/2008   A2GS 9.5 02/03/2008   BETS 8.1 (H) 02/03/2008   BETA2SER 5.8 02/03/2008   GAMS 17.6 02/03/2008   MSPIKE NOT DET 02/03/2008   SPEI * 02/03/2008     Chemistry      Component Value Date/Time   NA 137 01/04/2019 1526   NA 139 01/01/2017 1034   K 4.2 01/04/2019 1526   K 4.1 01/01/2017 1034   CL 106 01/04/2019 1526   CL 107 01/01/2017 1034   CO2 18 (L) 01/04/2019 1526   CO2 29 01/01/2017 1034   BUN 15 01/04/2019 1526   BUN 9 01/01/2017 1034   CREATININE 0.71 01/04/2019 1526      Component Value Date/Time   CALCIUM 9.4 01/04/2019 1526   CALCIUM  9.4 01/01/2017 1034   ALKPHOS 133 (H) 12/25/2018 1045   ALKPHOS 81 01/01/2017 1034   AST 64 (H) 01/04/2019 1526   AST 66 (H) 12/25/2018 1045   ALT 44 (H) 01/04/2019 1526   ALT 46 (H) 12/25/2018 1045   ALT 48 (H) 01/01/2017 1034   BILITOT 2.1 (H) 01/04/2019 1526   BILITOT 1.6 (H) 12/25/2018 1045       Impression and Plan: Ms. Pina is a very pleasant 78yo caucasian female with 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 Her recent scans showed overall stable disease. However, her AFP was up at her last visit. Today's result is pending.  We will  see what her iron studies show and replace if needed.  She will add Boost or Ensure once a day to her meal regimen.  We will proceed with treatment today as planned and see her back in another 3 weeks.  She will contact our office with any questions or concerns. We can certainly see her sooner if needed.   Laverna Peace, NP 10/23/202011:12 AM

## 2019-01-15 NOTE — Patient Instructions (Signed)
[facility] Discharge Instructions for Patients Receiving Chemotherapy  Today you received the following chemotherapy agents MVASI and Tecentriq.  To help prevent nausea and vomiting after your treatment, we encourage you to take your nausea medication as directed.   If you develop nausea and vomiting that is not controlled by your nausea medication, call the clinic.   BELOW ARE SYMPTOMS THAT SHOULD BE REPORTED IMMEDIATELY:  *FEVER GREATER THAN 100.5 F  *CHILLS WITH OR WITHOUT FEVER  NAUSEA AND VOMITING THAT IS NOT CONTROLLED WITH YOUR NAUSEA MEDICATION  *UNUSUAL SHORTNESS OF BREATH  *UNUSUAL BRUISING OR BLEEDING  TENDERNESS IN MOUTH AND THROAT WITH OR WITHOUT PRESENCE OF ULCERS  *URINARY PROBLEMS  *BOWEL PROBLEMS  UNUSUAL RASH Items with * indicate a potential emergency and should be followed up as soon as possible.  Feel free to call the clinic you have any questions or concerns. The clinic phone number is [phone].  Please show the [place] at check-in to the Emergency Department and triage nurse.

## 2019-01-15 NOTE — Progress Notes (Signed)
OK to treat with today's lab values per Dr. Maylon Peppers.

## 2019-01-16 LAB — AFP TUMOR MARKER: AFP, Serum, Tumor Marker: 335 ng/mL — ABNORMAL HIGH (ref 0.0–8.3)

## 2019-01-18 LAB — IRON AND TIBC
Iron: 110 ug/dL (ref 41–142)
Saturation Ratios: 31 % (ref 21–57)
TIBC: 353 ug/dL (ref 236–444)
UIBC: 243 ug/dL (ref 120–384)

## 2019-01-18 LAB — TSH: TSH: 1.614 u[IU]/mL (ref 0.308–3.960)

## 2019-01-18 LAB — FERRITIN: Ferritin: 85 ng/mL (ref 11–307)

## 2019-01-18 LAB — LACTATE DEHYDROGENASE: LDH: 408 U/L — ABNORMAL HIGH (ref 98–192)

## 2019-01-20 ENCOUNTER — Telehealth: Payer: Self-pay | Admitting: *Deleted

## 2019-01-20 NOTE — Telephone Encounter (Signed)
Beverly Davis stated she will call us back.

## 2019-01-25 ENCOUNTER — Telehealth: Payer: Self-pay

## 2019-01-25 ENCOUNTER — Other Ambulatory Visit: Payer: Self-pay

## 2019-01-25 DIAGNOSIS — Z20822 Contact with and (suspected) exposure to covid-19: Secondary | ICD-10-CM

## 2019-01-25 NOTE — Telephone Encounter (Signed)
Left message to call back  

## 2019-01-25 NOTE — Telephone Encounter (Signed)
Patient states that her ankles and the tops of feet are still swollen. Please advise.

## 2019-01-26 NOTE — Telephone Encounter (Signed)
Patient states that she is having shortness of breath sporadically, even when talking. Has been elevating her legs and states that she doesn't consume high sodium foods. Doesn't have any compression stockings, can we write a script for that? Patient also had a Covid test done yesterday.

## 2019-01-26 NOTE — Telephone Encounter (Signed)
78 y.o. 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 Called due to phone call to [place] reporting dyspnea with speaking in context of recent fatigue, LE edema and new murmur, has been referred to cardiology and pending evaluation by Dr. Debara Pickett on 02/11/2019.   She speaks in clear sentences with mental clarity consistent with her baseline at this time; she reports yesterday was feeling a bit short of breath, intermittently with speaking. She reports edema was briefly worse, but keeping extremities elevated and edema now improved though still present. She reports feels she is consistent with her state when I last saw her on 01/15/2019. She did present yesterday for drive through covid 19 testing yesterday per her family's preference.   She reports she has pulse oximeter and has been monitoring intermittently, unsure what the numbers were, can't recall, reports currently is 96% and PR 82. She thinks she may have BP cuff but unsure. She does not weigh herself. Weights in office have been trending down at recent frequent visits. She is not currently on BP medication due to historically trending low, and also with recent episodes of dizziness and falls (has new lumbar compression fracture r/t this).   Denies orthopnea and paroxysmal nocturnal dyspnea. Positive for fatigue and lower extremity edema. She denies wheezing, cough.  Wt Readings from Last 3 Encounters:  01/15/19 141 lb (64 kg)  01/14/19 144 lb (65.3 kg)  01/04/19 154 lb (69.9 kg)   Had a long conversation with patient; advised to find BP cuff, start checking BID with pulse ox and keep log of values; advised her to call immediately if O2 trending down, persistently <94% with worse edema, fatigue, dyspnea, wet cough, or to present to ED for evaluation. She would benefit from diuretic but due to high risk of falls, low  BPs, will hold off for now in lieu of her close monitoring at home after discussion of risks and benefits with her. She will call back with any concerns or new sx.

## 2019-01-27 LAB — NOVEL CORONAVIRUS, NAA: SARS-CoV-2, NAA: NOT DETECTED

## 2019-01-28 ENCOUNTER — Telehealth: Payer: Self-pay | Admitting: Internal Medicine

## 2019-02-01 ENCOUNTER — Telehealth: Payer: Self-pay | Admitting: Hematology & Oncology

## 2019-02-01 NOTE — Telephone Encounter (Signed)
Patient called w/ questions regarding appointment on 11/13.  She stated she is having issues w/ her heart and actually has appointment to see Dr Debara Pickett on 11/19.  She wanted to know if she should cancel her appt w/ Dr Marin Olp for 11/13.  I spoke with Thane Edu, RN Desk Nurse and she suggested that she keep her appointment for 11/13 and Dr Marin Olp would be the one to determine if she would be able to have her treatment on that day.  She voiced understanding of this information when we hung up the phone.

## 2019-02-03 NOTE — Telephone Encounter (Signed)
error 

## 2019-02-05 ENCOUNTER — Ambulatory Visit (HOSPITAL_BASED_OUTPATIENT_CLINIC_OR_DEPARTMENT_OTHER)
Admission: RE | Admit: 2019-02-05 | Discharge: 2019-02-05 | Disposition: A | Discharge status: Home / Self Care | Payer: PPO | Source: Ambulatory Visit | Attending: Hematology & Oncology | Admitting: Hematology & Oncology

## 2019-02-05 ENCOUNTER — Encounter: Payer: Self-pay | Admitting: Hematology & Oncology

## 2019-02-05 ENCOUNTER — Inpatient Hospital Stay: Payer: PPO

## 2019-02-05 ENCOUNTER — Telehealth: Payer: Self-pay | Admitting: *Deleted

## 2019-02-05 ENCOUNTER — Other Ambulatory Visit: Payer: Self-pay | Admitting: *Deleted

## 2019-02-05 ENCOUNTER — Other Ambulatory Visit: Payer: Self-pay

## 2019-02-05 ENCOUNTER — Telehealth: Payer: Self-pay | Admitting: Hematology & Oncology

## 2019-02-05 ENCOUNTER — Other Ambulatory Visit (HOSPITAL_COMMUNITY)
Admission: RE | Admit: 2019-02-05 | Discharge: 2019-02-05 | Disposition: A | Discharge status: Home / Self Care | Payer: PPO | Source: Ambulatory Visit | Attending: Hematology & Oncology | Admitting: Hematology & Oncology

## 2019-02-05 ENCOUNTER — Inpatient Hospital Stay: Payer: PPO | Attending: Hematology & Oncology | Admitting: Hematology & Oncology

## 2019-02-05 ENCOUNTER — Other Ambulatory Visit (HOSPITAL_COMMUNITY): Payer: PPO

## 2019-02-05 VITALS — BP 138/73 | HR 94 | Temp 97.5°F | Resp 18 | Ht 62.0 in | Wt 140.0 lb

## 2019-02-05 VITALS — Wt 141.0 lb

## 2019-02-05 DIAGNOSIS — Z01812 Encounter for preprocedural laboratory examination: Secondary | ICD-10-CM | POA: Insufficient documentation

## 2019-02-05 DIAGNOSIS — M549 Dorsalgia, unspecified: Secondary | ICD-10-CM | POA: Diagnosis not present

## 2019-02-05 DIAGNOSIS — Z79899 Other long term (current) drug therapy: Secondary | ICD-10-CM | POA: Diagnosis not present

## 2019-02-05 DIAGNOSIS — K5900 Constipation, unspecified: Secondary | ICD-10-CM | POA: Diagnosis not present

## 2019-02-05 DIAGNOSIS — C220 Liver cell carcinoma: Secondary | ICD-10-CM | POA: Diagnosis not present

## 2019-02-05 DIAGNOSIS — R0602 Shortness of breath: Secondary | ICD-10-CM | POA: Diagnosis not present

## 2019-02-05 DIAGNOSIS — Z20828 Contact with and (suspected) exposure to other viral communicable diseases: Secondary | ICD-10-CM | POA: Diagnosis not present

## 2019-02-05 DIAGNOSIS — J90 Pleural effusion, not elsewhere classified: Secondary | ICD-10-CM | POA: Diagnosis not present

## 2019-02-05 DIAGNOSIS — R600 Localized edema: Secondary | ICD-10-CM | POA: Diagnosis not present

## 2019-02-05 DIAGNOSIS — D509 Iron deficiency anemia, unspecified: Secondary | ICD-10-CM

## 2019-02-05 DIAGNOSIS — R5383 Other fatigue: Secondary | ICD-10-CM | POA: Diagnosis not present

## 2019-02-05 DIAGNOSIS — E1122 Type 2 diabetes mellitus with diabetic chronic kidney disease: Secondary | ICD-10-CM

## 2019-02-05 DIAGNOSIS — R413 Other amnesia: Secondary | ICD-10-CM | POA: Diagnosis not present

## 2019-02-05 DIAGNOSIS — Z452 Encounter for adjustment and management of vascular access device: Secondary | ICD-10-CM | POA: Insufficient documentation

## 2019-02-05 DIAGNOSIS — Z8505 Personal history of malignant neoplasm of liver: Secondary | ICD-10-CM

## 2019-02-05 DIAGNOSIS — K7581 Nonalcoholic steatohepatitis (NASH): Secondary | ICD-10-CM | POA: Insufficient documentation

## 2019-02-05 DIAGNOSIS — N182 Chronic kidney disease, stage 2 (mild): Secondary | ICD-10-CM

## 2019-02-05 DIAGNOSIS — K7460 Unspecified cirrhosis of liver: Secondary | ICD-10-CM | POA: Insufficient documentation

## 2019-02-05 DIAGNOSIS — R5381 Other malaise: Secondary | ICD-10-CM | POA: Diagnosis not present

## 2019-02-05 DIAGNOSIS — E1169 Type 2 diabetes mellitus with other specified complication: Secondary | ICD-10-CM

## 2019-02-05 DIAGNOSIS — Z95828 Presence of other vascular implants and grafts: Secondary | ICD-10-CM

## 2019-02-05 DIAGNOSIS — E032 Hypothyroidism due to medicaments and other exogenous substances: Secondary | ICD-10-CM

## 2019-02-05 LAB — CBC WITH DIFFERENTIAL (CANCER CENTER ONLY)
Abs Immature Granulocytes: 0 10*3/uL (ref 0.00–0.07)
Basophils Absolute: 0 10*3/uL (ref 0.0–0.1)
Basophils Relative: 1 %
Eosinophils Absolute: 0.1 10*3/uL (ref 0.0–0.5)
Eosinophils Relative: 2 %
HCT: 32.6 % — ABNORMAL LOW (ref 36.0–46.0)
Hemoglobin: 10.8 g/dL — ABNORMAL LOW (ref 12.0–15.0)
Immature Granulocytes: 0 %
Lymphocytes Relative: 15 %
Lymphs Abs: 0.4 10*3/uL — ABNORMAL LOW (ref 0.7–4.0)
MCH: 36.2 pg — ABNORMAL HIGH (ref 26.0–34.0)
MCHC: 33.1 g/dL (ref 30.0–36.0)
MCV: 109.4 fL — ABNORMAL HIGH (ref 80.0–100.0)
Monocytes Absolute: 0.3 10*3/uL (ref 0.1–1.0)
Monocytes Relative: 12 %
Neutro Abs: 2 10*3/uL (ref 1.7–7.7)
Neutrophils Relative %: 70 %
Platelet Count: 77 10*3/uL — ABNORMAL LOW (ref 150–400)
RBC: 2.98 MIL/uL — ABNORMAL LOW (ref 3.87–5.11)
RDW: 16.7 % — ABNORMAL HIGH (ref 11.5–15.5)
WBC Count: 2.8 10*3/uL — ABNORMAL LOW (ref 4.0–10.5)
nRBC: 0 % (ref 0.0–0.2)

## 2019-02-05 LAB — CMP (CANCER CENTER ONLY)
ALT: 54 U/L — ABNORMAL HIGH (ref 0–44)
AST: 81 U/L — ABNORMAL HIGH (ref 15–41)
Albumin: 3 g/dL — ABNORMAL LOW (ref 3.5–5.0)
Alkaline Phosphatase: 165 U/L — ABNORMAL HIGH (ref 38–126)
Anion gap: 11 (ref 5–15)
BUN: 18 mg/dL (ref 8–23)
CO2: 18 mmol/L — ABNORMAL LOW (ref 22–32)
Calcium: 9 mg/dL (ref 8.9–10.3)
Chloride: 108 mmol/L (ref 98–111)
Creatinine: 0.61 mg/dL (ref 0.44–1.00)
GFR, Est AFR Am: >60 mL/min (ref >60)
GFR, Estimated: >60 mL/min (ref >60)
Glucose, Bld: 154 mg/dL — ABNORMAL HIGH (ref 70–99)
Potassium: 4.1 mmol/L (ref 3.5–5.1)
Sodium: 137 mmol/L (ref 135–145)
Total Bilirubin: 2.3 mg/dL — ABNORMAL HIGH (ref 0.3–1.2)
Total Protein: 5.8 g/dL — ABNORMAL LOW (ref 6.5–8.1)

## 2019-02-05 LAB — LIPID PANEL
Cholesterol: 166 mg/dL (ref 0–200)
HDL: 38 mg/dL — ABNORMAL LOW (ref >40)
LDL Cholesterol: 103 mg/dL — ABNORMAL HIGH (ref 0–99)
Total CHOL/HDL Ratio: 4.4 RATIO
Triglycerides: 123 mg/dL (ref <150)
VLDL: 25 mg/dL (ref 0–40)

## 2019-02-05 LAB — HEMOGLOBIN A1C
Hgb A1c MFr Bld: 5.1 % (ref 4.8–5.6)
Mean Plasma Glucose: 99.67 mg/dL

## 2019-02-05 MED ORDER — HEPARIN SOD (PORK) LOCK FLUSH 100 UNIT/ML IV SOLN
500.0000 [IU] | Freq: Once | INTRAVENOUS | Status: AC
  Administered 2019-02-05: 14:00:00 500 [IU] via INTRAVENOUS
  Filled 2019-02-05: qty 5

## 2019-02-05 MED ORDER — SODIUM CHLORIDE 0.9% FLUSH
10.0000 mL | INTRAVENOUS | Status: DC | PRN
  Administered 2019-02-05: 14:00:00 10 mL via INTRAVENOUS
  Filled 2019-02-05: qty 10

## 2019-02-05 NOTE — Telephone Encounter (Signed)
lmom to inform patient of r/s appts per 11/13 LOS to 11/24 at 0830

## 2019-02-05 NOTE — Addendum Note (Signed)
Addended by: Melton Krebs on: 02/05/2019 02:30 PM   Modules accepted: Orders, SmartSet

## 2019-02-05 NOTE — Progress Notes (Signed)
LAB

## 2019-02-05 NOTE — Progress Notes (Signed)
Hematology and Oncology Follow Up Visit  Beverly Davis [id number] [birth year] 78 y.o. 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  Thankfully, she comes in with her son.  She is having problems with breathing.  She is seeing her family doctor.  Her family doctor wants her to go see cardiology.  All I did was a chest x-ray on her today.  I think another reason for her shortness of breath.  She clearly has a large right pleural effusion.  I think this effusion is probably from ascites.  I worry that this might represent progression of her hepatocellular carcinoma.  Also might represent worsening liver function from her underlying cirrhosis.  We desperately are trying to get a thoracentesis today.  This is been incredibly difficult because of the requirements needed for patients.  We have to get a coronavirus test on her before radiology will see her.  Otherwise, she seems to be okay.  She does have the memory issues.  This is not new.  She is not hurting.  She did fall.  She does have a compression fracture at L1.  I talked her about the possibility of a kyphoplasty.  I guess that she will think about this.  She has had no issues with diarrhea.  She is on lactulose.  Her last alpha-fetoprotein was 335 back in October.  Medications:  Allergies as of 02/05/2019      Reactions   Metformin And Related Other (See Comments)   Unknown   Welchol [colesevelam Hcl] Other (See Comments)   Unknown      Medication List       Accurate as of February 05, 2019  1:15 PM. If you have any  questions, ask your nurse or doctor.        cyanocobalamin 1000 MCG tablet Take 1,000 mcg by mouth daily.   glipiZIDE 5 MG tablet Commonly known as: GLUCOTROL Take 1/2-1 tablet 3 x /day with meals for Diabetes. Do not take if not eaing; take only 1/2 tab for small meals.   Januvia 100 MG tablet Generic drug: sitaGLIPtin Take 100 mg by mouth daily.   lactulose 10 GM/15ML solution Commonly known as: CHRONULAC Take 30 mLs (20 g total) by mouth 3 (three) times daily. What changed:   how much to take  when to take this  additional instructions   levothyroxine 112 MCG tablet Commonly known as: Synthroid Take 1 tablet daily on an empty stomach with only water for 30 minutes & no Antacid meds, Calcium or Magnesium for 4 hours & avoid Biotin   lidocaine-prilocaine cream Commonly known as: EMLA Apply to affected area once   magnesium oxide 400 MG tablet Commonly known as: MAG-OX Take 400 mg by mouth daily.   metFORMIN 500 MG tablet Commonly known as: Glucophage Take 1 tablet with Breakfast & Lunch and 2 tablets with supper for Diabetes   ondansetron 8 MG tablet Commonly known as: Zofran Take 1 tablet (8 mg total) by mouth 2 (two) times daily as needed (Nausea or vomiting).   OneTouch Delica Plus [id number] Misc 300 Wafers by Does not apply route 3 (three) times daily before meals. Check  blood sugar 3 x  /day   OneTouch Ultra test strip Generic drug: glucose blood Check blood Sugar 3 x  /day before Meals   prochlorperazine 10 MG tablet Commonly known as: COMPAZINE Take 1 tablet (10 mg total) by mouth every 6 (six) hours as needed (Nausea or vomiting).   rifaximin 550 MG Tabs tablet Commonly known as: XIFAXAN Take 550 mg by mouth 2 (two) times daily.   Vitamin D3 125 MCG (5000 UT) Caps Take 5,000 Units by mouth daily.       Allergies:  Allergies  Allergen Reactions  . Metformin And Related Other (See Comments)    Unknown  . Welchol [Colesevelam Hcl] Other  (See Comments)    Unknown    Past Medical History, Surgical history, Social history, and Family History were reviewed and updated.  Review of Systems: Review of Systems  Constitutional: Positive for malaise/fatigue.  HENT: Negative.   Eyes: Negative.   Respiratory: Positive for shortness of breath.   Cardiovascular: Positive for leg swelling.  Gastrointestinal: Positive for constipation.  Genitourinary: Negative.   Musculoskeletal: Positive for back pain.  Skin: Negative.   Neurological: Negative.   Endo/Heme/Allergies: Negative.   Psychiatric/Behavioral: Positive for memory loss.     Physical Exam:  weight is 141 lb (64 kg).   Wt Readings from Last 3 Encounters:  02/05/19 141 lb (64 kg)  02/05/19 140 lb (63.5 kg)  01/15/19 141 lb (64 kg)    Physical Exam Vitals signs reviewed.  HENT:     Head: Normocephalic and atraumatic.  Eyes:     Pupils: Pupils are equal, round, and reactive to light.  Neck:     Musculoskeletal: Normal range of motion.  Cardiovascular:     Rate and Rhythm: Normal rate and regular rhythm.     Heart sounds: Normal heart sounds.     Comments: Cardiac exam regular rate and rhythm with a normal S1 and S2.  She has a 1/6 systolic ejection murmur. Pulmonary:     Effort: Pulmonary effort is normal.     Breath sounds: Normal breath sounds.     Comments: Pulmonary exam shows decreased breath sounds on the right side.  She has decent breath sounds on the left side.  No wheezing is noted. Abdominal:     General: Bowel sounds are normal.     Palpations: Abdomen is soft.     Comments: Abdominal exam shows a somewhat distended abdomen.  She has what appears to be ascites.  There is no guarding.  She has no tenderness.  There is no obvious hepatomegaly or splenomegaly.  Musculoskeletal: Normal range of motion.        General: No tenderness or deformity.     Comments: Extremities shows some mild edema in her ankles.  She has decent range of motion of her  joints.  There is no joint swelling.  Lymphadenopathy:     Cervical: No cervical adenopathy.  Skin:    General: Skin is warm and dry.     Findings: No erythema or rash.  Neurological:     Mental Status: She is alert and oriented to person, place, and time.  Psychiatric:        Behavior: Behavior normal.        Thought Content: Thought content normal.        Judgment: Judgment normal.      Lab Results  Component Value Date   WBC 2.8 (L) 02/05/2019   HGB 10.8 (L) 02/05/2019   HCT  32.6 (L) 02/05/2019   MCV 109.4 (H) 02/05/2019   PLT 77 (L) 02/05/2019   Lab Results  Component Value Date   FERRITIN 85 01/15/2019   IRON 110 01/15/2019   TIBC 353 01/15/2019   UIBC 243 01/15/2019   IRONPCTSAT 31 01/15/2019   Lab Results  Component Value Date   RETICCTPCT 1.2 09/05/2015   RBC 2.98 (L) 02/05/2019   RETICCTABS 40,080 09/05/2015   No results found for: KPAFRELGTCHN, LAMBDASER, KAPLAMBRATIO No results found for: Kandis Cocking, [facility] Lab Results  Component Value Date   TOTALPROTELP 7.1 02/03/2008   ALBUMINELP 54.8 (L) 02/03/2008   A1GS 4.2 02/03/2008   A2GS 9.5 02/03/2008   BETS 8.1 (H) 02/03/2008   BETA2SER 5.8 02/03/2008   GAMS 17.6 02/03/2008   MSPIKE NOT DET 02/03/2008   SPEI * 02/03/2008     Chemistry      Component Value Date/Time   NA 137 02/05/2019 1212   NA 139 01/01/2017 1034   K 4.1 02/05/2019 1212   K 4.1 01/01/2017 1034   CL 108 02/05/2019 1212   CL 107 01/01/2017 1034   CO2 18 (L) 02/05/2019 1212   CO2 29 01/01/2017 1034   BUN 18 02/05/2019 1212   BUN 9 01/01/2017 1034   CREATININE 0.61 02/05/2019 1212   CREATININE 0.71 01/04/2019 1526      Component Value Date/Time   CALCIUM 9.0 02/05/2019 1212   CALCIUM 9.4 01/01/2017 1034   ALKPHOS 165 (H) 02/05/2019 1212   ALKPHOS 81 01/01/2017 1034   AST 81 (H) 02/05/2019 1212   ALT 54 (H) 02/05/2019 1212   ALT 48 (H) 01/01/2017 1034   BILITOT 2.3 (H) 02/05/2019 1212       Impression and Plan:  Ms. Howeth is a very pleasant 78yo caucasian female with 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  Her problem right now is this right pleural effusion.  I have to believe that she does have significant ascites.  We will have to see about a thoracentesis on her.  Again, the soonest we can probably get this done is Monday.  We also have to see about a paracentesis.  I do think that she does have significant ascites.  We will see what her alpha-fetoprotein level is.  Hopefully, it is not progressing.  We may have to consider scans so we can see how her hepatocellular carcinoma is doing.  This was very complicated.  Took [place] about how her to try to take care of everything.  I reviewed the x-ray.  I did try to get her set up with the procedures.  Her probably have to get her back to see [place] in another couple weeks.   [name], MD 11/13/20201:15 PM

## 2019-02-05 NOTE — Patient Instructions (Signed)

## 2019-02-05 NOTE — Telephone Encounter (Signed)
Call received from patient to review her appts with her.  Schedule reviewed with pt.  Pt requesting later appts on 02/16/19 and transferred to scheduling.

## 2019-02-06 ENCOUNTER — Other Ambulatory Visit: Payer: Self-pay | Admitting: Adult Health

## 2019-02-06 LAB — T4: T4, Total: 11.3 ug/dL (ref 4.5–12.0)

## 2019-02-06 LAB — SARS CORONAVIRUS 2 (TAT 6-24 HRS): SARS Coronavirus 2: NEGATIVE

## 2019-02-06 LAB — AFP TUMOR MARKER: AFP, Serum, Tumor Marker: 240 ng/mL — ABNORMAL HIGH (ref 0.0–8.3)

## 2019-02-08 LAB — LACTATE DEHYDROGENASE: LDH: 378 U/L — ABNORMAL HIGH (ref 98–192)

## 2019-02-08 LAB — TSH: TSH: 1.497 u[IU]/mL (ref 0.308–3.960)

## 2019-02-09 ENCOUNTER — Ambulatory Visit (HOSPITAL_COMMUNITY)
Admission: RE | Admit: 2019-02-09 | Discharge: 2019-02-09 | Disposition: A | Discharge status: Home / Self Care | Payer: PPO | Source: Ambulatory Visit | Attending: Hematology & Oncology | Admitting: Hematology & Oncology

## 2019-02-09 ENCOUNTER — Ambulatory Visit (HOSPITAL_COMMUNITY)
Admission: RE | Admit: 2019-02-09 | Discharge: 2019-02-09 | Disposition: A | Discharge status: Home / Self Care | Payer: PPO | Source: Ambulatory Visit | Attending: Radiology | Admitting: Radiology

## 2019-02-09 ENCOUNTER — Other Ambulatory Visit: Payer: Self-pay | Admitting: Family

## 2019-02-09 ENCOUNTER — Other Ambulatory Visit: Payer: Self-pay

## 2019-02-09 DIAGNOSIS — J948 Other specified pleural conditions: Secondary | ICD-10-CM | POA: Diagnosis not present

## 2019-02-09 DIAGNOSIS — C220 Liver cell carcinoma: Secondary | ICD-10-CM | POA: Diagnosis not present

## 2019-02-09 DIAGNOSIS — J90 Pleural effusion, not elsewhere classified: Secondary | ICD-10-CM | POA: Diagnosis not present

## 2019-02-09 DIAGNOSIS — Z9889 Other specified postprocedural states: Secondary | ICD-10-CM

## 2019-02-09 NOTE — Procedures (Signed)
Ultrasound-guided diagnostic and therapeutic right thoracentesis performed yielding 1.3 liters of yellow fluid. No immediate complications. Follow-up chest x-ray pending. A portion of the fluid was sent to the lab for cytology. EBL< 1 cc. Due to persistent pt coughing only the above amount of fluid was removed today.

## 2019-02-10 ENCOUNTER — Ambulatory Visit: Payer: PPO | Admitting: Internal Medicine

## 2019-02-10 LAB — CYTOLOGY - NON PAP

## 2019-02-11 ENCOUNTER — Ambulatory Visit: Payer: PPO | Admitting: Internal Medicine

## 2019-02-16 ENCOUNTER — Ambulatory Visit: Payer: PPO

## 2019-02-16 ENCOUNTER — Ambulatory Visit: Payer: PPO | Admitting: Hematology & Oncology

## 2019-02-16 ENCOUNTER — Other Ambulatory Visit: Payer: PPO

## 2019-02-22 ENCOUNTER — Other Ambulatory Visit: Payer: Self-pay | Admitting: Family

## 2019-02-22 ENCOUNTER — Ambulatory Visit (HOSPITAL_COMMUNITY)
Admission: RE | Admit: 2019-02-22 | Discharge: 2019-02-22 | Disposition: A | Discharge status: Home / Self Care | Payer: PPO | Source: Ambulatory Visit | Attending: Family | Admitting: Family

## 2019-02-22 ENCOUNTER — Other Ambulatory Visit: Payer: Self-pay

## 2019-02-22 DIAGNOSIS — R188 Other ascites: Secondary | ICD-10-CM | POA: Insufficient documentation

## 2019-02-22 DIAGNOSIS — C220 Liver cell carcinoma: Secondary | ICD-10-CM | POA: Diagnosis not present

## 2019-02-22 NOTE — Progress Notes (Signed)
IR requested by Dr. Marin Olp for possible image-guided paracentesis.  Limited abdominal US revealed no fluid that could be safely accessed with procedure today. Images sent to Dr. Vernard Gambles for review. Informed patient that procedure will not occur today. All questions answered and concerns addressed. Will make Dr. Marin Olp aware.  IR available in future if needed.   Bea Graff Ledon Weihe, PA-C 02/22/2019, 3:01 PM

## 2019-02-24 ENCOUNTER — Other Ambulatory Visit: Payer: Self-pay | Admitting: Adult Health

## 2019-02-24 ENCOUNTER — Telehealth: Payer: Self-pay

## 2019-02-24 MED ORDER — MECLIZINE HCL 25 MG PO TABS: ORAL_TABLET | ORAL | 0 refills | Status: DC

## 2019-02-24 NOTE — Telephone Encounter (Signed)
Refill request for Meclizine. States that she still has dizziness every once in awhile.

## 2019-02-25 ENCOUNTER — Inpatient Hospital Stay: Payer: PPO

## 2019-02-25 ENCOUNTER — Inpatient Hospital Stay (HOSPITAL_BASED_OUTPATIENT_CLINIC_OR_DEPARTMENT_OTHER): Payer: PPO | Admitting: Family

## 2019-02-25 ENCOUNTER — Other Ambulatory Visit: Payer: Self-pay

## 2019-02-25 ENCOUNTER — Inpatient Hospital Stay: Payer: PPO | Attending: Hematology & Oncology

## 2019-02-25 VITALS — BP 118/68 | HR 102 | Temp 98.1°F | Resp 18 | Ht 62.0 in | Wt 139.1 lb

## 2019-02-25 DIAGNOSIS — R600 Localized edema: Secondary | ICD-10-CM | POA: Insufficient documentation

## 2019-02-25 DIAGNOSIS — R42 Dizziness and giddiness: Secondary | ICD-10-CM | POA: Diagnosis not present

## 2019-02-25 DIAGNOSIS — D509 Iron deficiency anemia, unspecified: Secondary | ICD-10-CM

## 2019-02-25 DIAGNOSIS — M7989 Other specified soft tissue disorders: Secondary | ICD-10-CM | POA: Insufficient documentation

## 2019-02-25 DIAGNOSIS — C220 Liver cell carcinoma: Secondary | ICD-10-CM | POA: Diagnosis not present

## 2019-02-25 DIAGNOSIS — Z5112 Encounter for antineoplastic immunotherapy: Secondary | ICD-10-CM | POA: Insufficient documentation

## 2019-02-25 DIAGNOSIS — Z7901 Long term (current) use of anticoagulants: Secondary | ICD-10-CM | POA: Insufficient documentation

## 2019-02-25 DIAGNOSIS — R140 Abdominal distension (gaseous): Secondary | ICD-10-CM | POA: Insufficient documentation

## 2019-02-25 DIAGNOSIS — Z79899 Other long term (current) drug therapy: Secondary | ICD-10-CM | POA: Diagnosis not present

## 2019-02-25 DIAGNOSIS — R5383 Other fatigue: Secondary | ICD-10-CM | POA: Insufficient documentation

## 2019-02-25 DIAGNOSIS — K7581 Nonalcoholic steatohepatitis (NASH): Secondary | ICD-10-CM

## 2019-02-25 DIAGNOSIS — Z853 Personal history of malignant neoplasm of breast: Secondary | ICD-10-CM | POA: Insufficient documentation

## 2019-02-25 DIAGNOSIS — K921 Melena: Secondary | ICD-10-CM | POA: Diagnosis not present

## 2019-02-25 DIAGNOSIS — Z7984 Long term (current) use of oral hypoglycemic drugs: Secondary | ICD-10-CM | POA: Diagnosis not present

## 2019-02-25 DIAGNOSIS — Z8505 Personal history of malignant neoplasm of liver: Secondary | ICD-10-CM

## 2019-02-25 DIAGNOSIS — F0390 Unspecified dementia without behavioral disturbance: Secondary | ICD-10-CM | POA: Insufficient documentation

## 2019-02-25 DIAGNOSIS — K7460 Unspecified cirrhosis of liver: Secondary | ICD-10-CM

## 2019-02-25 LAB — CBC WITH DIFFERENTIAL (CANCER CENTER ONLY)
Abs Immature Granulocytes: 0.01 10*3/uL (ref 0.00–0.07)
Basophils Absolute: 0 10*3/uL (ref 0.0–0.1)
Basophils Relative: 1 %
Eosinophils Absolute: 0.1 10*3/uL (ref 0.0–0.5)
Eosinophils Relative: 3 %
HCT: 32.1 % — ABNORMAL LOW (ref 36.0–46.0)
Hemoglobin: 10.7 g/dL — ABNORMAL LOW (ref 12.0–15.0)
Immature Granulocytes: 0 %
Lymphocytes Relative: 19 %
Lymphs Abs: 0.5 10*3/uL — ABNORMAL LOW (ref 0.7–4.0)
MCH: 36.1 pg — ABNORMAL HIGH (ref 26.0–34.0)
MCHC: 33.3 g/dL (ref 30.0–36.0)
MCV: 108.4 fL — ABNORMAL HIGH (ref 80.0–100.0)
Monocytes Absolute: 0.4 10*3/uL (ref 0.1–1.0)
Monocytes Relative: 15 %
Neutro Abs: 1.5 10*3/uL — ABNORMAL LOW (ref 1.7–7.7)
Neutrophils Relative %: 62 %
Platelet Count: 88 10*3/uL — ABNORMAL LOW (ref 150–400)
RBC: 2.96 MIL/uL — ABNORMAL LOW (ref 3.87–5.11)
RDW: 15.9 % — ABNORMAL HIGH (ref 11.5–15.5)
WBC Count: 2.5 10*3/uL — ABNORMAL LOW (ref 4.0–10.5)
nRBC: 0 % (ref 0.0–0.2)

## 2019-02-25 LAB — CMP (CANCER CENTER ONLY)
ALT: 47 U/L — ABNORMAL HIGH (ref 0–44)
AST: 73 U/L — ABNORMAL HIGH (ref 15–41)
Albumin: 2.9 g/dL — ABNORMAL LOW (ref 3.5–5.0)
Alkaline Phosphatase: 146 U/L — ABNORMAL HIGH (ref 38–126)
Anion gap: 11 (ref 5–15)
BUN: 11 mg/dL (ref 8–23)
CO2: 21 mmol/L — ABNORMAL LOW (ref 22–32)
Calcium: 8.9 mg/dL (ref 8.9–10.3)
Chloride: 105 mmol/L (ref 98–111)
Creatinine: 0.65 mg/dL (ref 0.44–1.00)
GFR, Est AFR Am: >60 mL/min (ref >60)
GFR, Estimated: >60 mL/min (ref >60)
Glucose, Bld: 189 mg/dL — ABNORMAL HIGH (ref 70–99)
Potassium: 3.8 mmol/L (ref 3.5–5.1)
Sodium: 137 mmol/L (ref 135–145)
Total Bilirubin: 2.3 mg/dL — ABNORMAL HIGH (ref 0.3–1.2)
Total Protein: 6 g/dL — ABNORMAL LOW (ref 6.5–8.1)

## 2019-02-25 LAB — AMMONIA: Ammonia: 73 umol/L — ABNORMAL HIGH (ref 9–35)

## 2019-02-25 MED ORDER — SODIUM CHLORIDE 0.9% FLUSH
10.0000 mL | INTRAVENOUS | Status: DC | PRN
  Filled 2019-02-25: qty 10

## 2019-02-25 MED ORDER — HEPARIN SOD (PORK) LOCK FLUSH 100 UNIT/ML IV SOLN
500.0000 [IU] | Freq: Once | INTRAVENOUS | Status: AC | PRN
  Administered 2019-02-25: 500 [IU]
  Filled 2019-02-25: qty 5

## 2019-02-25 MED ORDER — SODIUM CHLORIDE 0.9 % IV SOLN
1200.0000 mg | Freq: Once | INTRAVENOUS | Status: AC
  Administered 2019-02-25: 1200 mg via INTRAVENOUS
  Filled 2019-02-25: qty 20

## 2019-02-25 MED ORDER — SODIUM CHLORIDE 0.9 % IV SOLN
Freq: Once | INTRAVENOUS | Status: AC
  Administered 2019-02-25: 13:00:00 via INTRAVENOUS
  Filled 2019-02-25: qty 250

## 2019-02-25 MED ORDER — SODIUM CHLORIDE 0.9 % IV SOLN
15.3000 mg/kg | Freq: Once | INTRAVENOUS | Status: AC
  Administered 2019-02-25: 1000 mg via INTRAVENOUS
  Filled 2019-02-25: qty 32

## 2019-02-25 NOTE — Progress Notes (Signed)
Hematology and Oncology Follow Up Visit  Beverly Davis [id number] 05/08/1940 78 y.o. 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 She has some fatigue and dizziness when laying down at times.  No falls or syncopal episodes to report.  Ammonia level is down to 73. She is still on Lactulose.  AFP in November was down to 240. Today's result is pending.  She had a thoracentesis on 11/17 which yielded 1.3 liters of fluid. She still has some SOB with over exertion and a dry cough. No distress at this time. She states that she will let us know if she feels her SOB worsens.  She had an [place] earlier this week on Tuesday which showed only trace ascites and no need for paracentesis at this time.  She has had no issue with bleeding, no bruising or petechiae.  No fever, chills, n/v, rash, chest pain, palpitations, abdominal pain or changes in bowel or bladder habits. No tenderness, numbness or tingling in her extremities.  She has swelling in the right foot and ankle that comes and goes.  Her appetite comes and goes. She has had issues with her blood sugars while drink 1 Boost a day. She will switch to Glucerna as well as increase her protein intake daily. I did write this out for her to take home with her.  She states that she is hydrating well. Her weight is stable.   ECOG Performance Status: 1 - Symptomatic but completely ambulatory  Medications:  Allergies as of 02/25/2019      Reactions   Metformin And Related Other (See Comments)   Unknown   Welchol [colesevelam Hcl] Other (See Comments)   Unknown      Medication List       Accurate as of February 25, 2019 12:06 PM. If you have any questions, ask your nurse or doctor.        cyanocobalamin 1000 MCG tablet Take 1,000 mcg by mouth daily.   Januvia 100 MG tablet Generic drug: sitaGLIPtin Take 100 mg by mouth daily.   lactulose 10 GM/15ML solution Commonly known as: CHRONULAC Take 30 mLs (20 g total) by mouth 3 (three) times daily. What changed:   how much to take  when to take this  additional instructions   levothyroxine 112 MCG tablet Commonly known as: Synthroid Take 1 tablet daily on an empty stomach with only water for 30 minutes & no Antacid meds, Calcium or Magnesium for 4 hours & avoid Biotin   lidocaine-prilocaine cream Commonly known as: EMLA Apply to affected area once   magnesium oxide 400 MG tablet Commonly known as: MAG-OX Take 400 mg by mouth daily.   meclizine 25 MG tablet Commonly known as: ANTIVERT 1/2-1 pill up to 3 times daily as needed dizziness.   metFORMIN 500 MG tablet Commonly known as: Glucophage Take 1 tablet with Breakfast & Lunch and 2 tablets with supper for Diabetes   ondansetron 8 MG tablet Commonly known as: Zofran Take 1 tablet (8 mg total) by mouth 2 (two) times daily as needed (Nausea or vomiting).   OneTouch Delica Plus [id number] Misc 300 Wafers by Does not apply route 3 (three)  times daily before meals. Check blood sugar 3 x  /day   OneTouch Ultra test strip Generic drug: glucose blood Check blood Sugar 3 x  /day before Meals   prochlorperazine 10 MG tablet Commonly known as: COMPAZINE Take 1 tablet (10 mg total) by mouth every 6 (six) hours as needed (Nausea or vomiting).   rifaximin 550 MG Tabs tablet Commonly known as: XIFAXAN Take 550 mg by mouth 2 (two) times daily.   Vitamin D3 125 MCG (5000 UT) Caps Take 5,000 Units by mouth daily.       Allergies:  Allergies  Allergen Reactions  . Metformin And Related Other (See Comments)    Unknown   . Welchol [Colesevelam Hcl] Other (See Comments)    Unknown    Past Medical History, Surgical history, Social history, and Family History were reviewed and updated.  Review of Systems: All other 10 point review of systems is negative.   Physical Exam:  height is 5' 2"  (1.575 m) and weight is 139 lb 1.9 oz (63.1 kg). Her temporal temperature is 98.1 F (36.7 C). Her blood pressure is 118/68 and her pulse is 102 (abnormal). Her respiration is 18 and oxygen saturation is 99%.   Wt Readings from Last 3 Encounters:  02/25/19 139 lb 1.9 oz (63.1 kg)  02/25/19 139 lb 1.9 oz (63.1 kg)  02/05/19 141 lb (64 kg)    Ocular: Sclerae unicteric, pupils equal, round and reactive to light Ear-nose-throat: Oropharynx clear, dentition fair Lymphatic: No cervical or supraclavicular adenopathy Lungs no rales or rhonchi, good excursion bilaterally Heart regular rate and rhythm, no murmur appreciated Abd soft, nontender, positive bowel sounds, no liver or spleen tip palpated on exam, no fluid wave  MSK no focal spinal tenderness, no joint edema Neuro: non-focal, well-oriented, appropriate affect Breasts: Deferred   Lab Results  Component Value Date   WBC 2.5 (L) 02/25/2019   HGB 10.7 (L) 02/25/2019   HCT 32.1 (L) 02/25/2019   MCV 108.4 (H) 02/25/2019   PLT 88 (L) 02/25/2019   Lab Results  Component Value Date   FERRITIN 85 01/15/2019   IRON 110 01/15/2019   TIBC 353 01/15/2019   UIBC 243 01/15/2019   IRONPCTSAT 31 01/15/2019   Lab Results  Component Value Date   RETICCTPCT 1.2 09/05/2015   RBC 2.96 (L) 02/25/2019   RETICCTABS 40,080 09/05/2015   No results found for: KPAFRELGTCHN, LAMBDASER, KAPLAMBRATIO No results found for: Kandis Cocking, [facility] Lab Results  Component Value Date   TOTALPROTELP 7.1 02/03/2008   ALBUMINELP 54.8 (L) 02/03/2008   A1GS 4.2 02/03/2008   A2GS 9.5 02/03/2008   BETS 8.1 (H) 02/03/2008   BETA2SER 5.8 02/03/2008   GAMS 17.6 02/03/2008   MSPIKE NOT  DET 02/03/2008   SPEI * 02/03/2008     Chemistry      Component Value Date/Time   NA 137 02/25/2019 1115   NA 139 01/01/2017 1034   K 3.8 02/25/2019 1115   K 4.1 01/01/2017 1034   CL 105 02/25/2019 1115   CL 107 01/01/2017 1034   CO2 21 (L) 02/25/2019 1115   CO2 29 01/01/2017 1034   BUN 11 02/25/2019 1115   BUN 9 01/01/2017 1034   CREATININE 0.65 02/25/2019 1115   CREATININE 0.71 01/04/2019 1526      Component Value Date/Time   CALCIUM 8.9 02/25/2019 1115   CALCIUM 9.4 01/01/2017 1034   ALKPHOS 146 (H) 02/25/2019 1115   ALKPHOS 81 01/01/2017 1034   AST 73 (H)  02/25/2019 1115   ALT 47 (H) 02/25/2019 1115   ALT 48 (H) 01/01/2017 1034   BILITOT 2.3 (H) 02/25/2019 1115       Impression and Plan: Ms. 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 We will proceed with treatment today as planned.  She will increase her protein intake daily and changed from Boost to Glucerna once a day.  We will repeat CT scans on her chest, abdomen and pelvis same day as her next follow-up and treatment in 4 weeks (after Christmas).  She will contact our office with any questions or concerns. We can certainly see her sooner if needed.   Laverna Peace, NP 12/3/202012:06 PM

## 2019-02-25 NOTE — Patient Instructions (Signed)

## 2019-02-25 NOTE — Patient Instructions (Signed)
Atezolizumab injection What is this medicine? ATEZOLIZUMAB (a te zoe LIZ ue mab) is a monoclonal antibody. It is used to treat bladder cancer (urothelial cancer), non-small cell lung cancer, small cell lung cancer, and breast cancer. This medicine may be used for other purposes; ask your health care provider or pharmacist if you have questions. COMMON BRAND NAME(S): Tecentriq What should I tell my health care provider before I take this medicine? They need to know if you have any of these conditions:  diabetes  immune system problems  infection  inflammatory bowel disease  liver disease  lung or breathing disease  lupus  nervous system problems like myasthenia gravis or Guillain-Barre syndrome  organ transplant  an unusual or allergic reaction to atezolizumab, other medicines, foods, dyes, or preservatives  pregnant or trying to get pregnant  breast-feeding How should I use this medicine? This medicine is for infusion into a vein. It is given by a health care professional in a hospital or clinic setting. A special MedGuide will be given to you before each treatment. Be sure to read this information carefully each time. Talk to your pediatrician regarding the use of this medicine in children. Special care may be needed. Overdosage: If you think you have taken too much of this medicine contact a poison control center or emergency room at once. NOTE: This medicine is only for you. Do not share this medicine with others. What if I miss a dose? It is important not to miss your dose. Call your doctor or health care professional if you are unable to keep an appointment. What may interact with this medicine? Interactions have not been studied. This list may not describe all possible interactions. Give your health care provider a list of all the medicines, herbs, non-prescription drugs, or dietary supplements you use. Also tell them if you smoke, drink alcohol, or use illegal drugs.  Some items may interact with your medicine. What should I watch for while using this medicine? Your condition will be monitored carefully while you are receiving this medicine. You may need blood work done while you are taking this medicine. Do not become pregnant while taking this medicine or for at least 5 months after stopping it. Women should inform their doctor if they wish to become pregnant or think they might be pregnant. There is a potential for serious side effects to an unborn child. Talk to your health care professional or pharmacist for more information. Do not breast-feed an infant while taking this medicine or for at least 5 months after the last dose. What side effects may I notice from receiving this medicine? Side effects that you should report to your doctor or health care professional as soon as possible:  allergic reactions like skin rash, itching or hives, swelling of the face, lips, or tongue  black, tarry stools  bloody or watery diarrhea  breathing problems  changes in vision  chest pain or chest tightness  chills  facial flushing  fever  headache  signs and symptoms of high blood sugar such as dizziness; dry mouth; dry skin; fruity breath; nausea; stomach pain; increased hunger or thirst; increased urination  signs and symptoms of liver injury like dark yellow or brown urine; general ill feeling or flu-like symptoms; light-colored stools; loss of appetite; nausea; right upper belly pain; unusually weak or tired; yellowing of the eyes or skin  stomach pain  trouble passing urine or change in the amount of urine Side effects that usually do not require medical  attention (report to your doctor or health care professional if they continue or are bothersome):  cough  diarrhea  joint pain  muscle pain  muscle weakness  tiredness  weight loss This list may not describe all possible side effects. Call your doctor for medical advice about side  effects. You may report side effects to FDA at 1-800-FDA-1088. Where should I keep my medicine? This drug is given in a hospital or clinic and will not be stored at home. NOTE: This sheet is a summary. It may not cover all possible information. If you have questions about this medicine, talk to your doctor, pharmacist, or health care provider.  2020 Elsevier/Gold Standard (2017-06-13 09:33:38) Bevacizumab injection What is this medicine? BEVACIZUMAB (be va SIZ yoo mab) is a monoclonal antibody. It is used to treat many types of cancer. This medicine may be used for other purposes; ask your health care provider or pharmacist if you have questions. COMMON BRAND NAME(S): Avastin, MVASI, Zirabev What should I tell my health care provider before I take this medicine? They need to know if you have any of these conditions:  diabetes  heart disease  high blood pressure  history of coughing up blood  prior anthracycline chemotherapy (e.g., doxorubicin, daunorubicin, epirubicin)  recent or ongoing radiation therapy  recent or planning to have surgery  stroke  an unusual or allergic reaction to bevacizumab, hamster proteins, mouse proteins, other medicines, foods, dyes, or preservatives  pregnant or trying to get pregnant  breast-feeding How should I use this medicine? This medicine is for infusion into a vein. It is given by a health care professional in a hospital or clinic setting. Talk to your pediatrician regarding the use of this medicine in children. Special care may be needed. Overdosage: If you think you have taken too much of this medicine contact a poison control center or emergency room at once. NOTE: This medicine is only for you. Do not share this medicine with others. What if I miss a dose? It is important not to miss your dose. Call your doctor or health care professional if you are unable to keep an appointment. What may interact with this medicine? Interactions are  not expected. This list may not describe all possible interactions. Give your health care provider a list of all the medicines, herbs, non-prescription drugs, or dietary supplements you use. Also tell them if you smoke, drink alcohol, or use illegal drugs. Some items may interact with your medicine. What should I watch for while using this medicine? Your condition will be monitored carefully while you are receiving this medicine. You will need important blood work and urine testing done while you are taking this medicine. This medicine may increase your risk to bruise or bleed. Call your doctor or health care professional if you notice any unusual bleeding. This medicine should be started at least 28 days following major surgery and the site of the surgery should be totally healed. Check with your doctor before scheduling dental work or surgery while you are receiving this treatment. Talk to your doctor if you have recently had surgery or if you have a wound that has not healed. Do not become pregnant while taking this medicine or for 6 months after stopping it. Women should inform their doctor if they wish to become pregnant or think they might be pregnant. There is a potential for serious side effects to an unborn child. Talk to your health care professional or pharmacist for more information. Do not breast-feed  an infant while taking this medicine and for 6 months after the last dose. This medicine has caused ovarian failure in some women. This medicine may interfere with the ability to have a child. You should talk to your doctor or health care professional if you are concerned about your fertility. What side effects may I notice from receiving this medicine? Side effects that you should report to your doctor or health care professional as soon as possible:  allergic reactions like skin rash, itching or hives, swelling of the face, lips, or tongue  chest pain or chest tightness  chills  coughing  up blood  high fever  seizures  severe constipation  signs and symptoms of bleeding such as bloody or black, tarry stools; red or dark-brown urine; spitting up blood or brown material that looks like coffee grounds; red spots on the skin; unusual bruising or bleeding from the eye, gums, or nose  signs and symptoms of a blood clot such as breathing problems; chest pain; severe, sudden headache; pain, swelling, warmth in the leg  signs and symptoms of a stroke like changes in vision; confusion; trouble speaking or understanding; severe headaches; sudden numbness or weakness of the face, arm or leg; trouble walking; dizziness; loss of balance or coordination  stomach pain  sweating  swelling of legs or ankles  vomiting  weight gain Side effects that usually do not require medical attention (report to your doctor or health care professional if they continue or are bothersome):  back pain  changes in taste  decreased appetite  dry skin  nausea  tiredness This list may not describe all possible side effects. Call your doctor for medical advice about side effects. You may report side effects to FDA at 1-800-FDA-1088. Where should I keep my medicine? This drug is given in a hospital or clinic and will not be stored at home. NOTE: This sheet is a summary. It may not cover all possible information. If you have questions about this medicine, talk to your doctor, pharmacist, or health care provider.  2020 Elsevier/Gold Standard (2016-03-08 14:33:29)

## 2019-02-25 NOTE — Progress Notes (Signed)
Per sara cincinati NP okay to treat with elevated heart rate and elevated lft's.

## 2019-02-26 ENCOUNTER — Ambulatory Visit: Payer: PPO | Admitting: Hematology & Oncology

## 2019-02-26 ENCOUNTER — Other Ambulatory Visit: Payer: PPO

## 2019-02-26 ENCOUNTER — Ambulatory Visit: Payer: PPO

## 2019-02-26 LAB — LACTATE DEHYDROGENASE: LDH: 388 U/L — ABNORMAL HIGH (ref 98–192)

## 2019-02-26 LAB — AFP TUMOR MARKER: AFP, Serum, Tumor Marker: 200 ng/mL — ABNORMAL HIGH (ref 0.0–8.3)

## 2019-03-10 ENCOUNTER — Other Ambulatory Visit: Payer: Self-pay | Admitting: Family

## 2019-03-10 ENCOUNTER — Telehealth: Payer: Self-pay | Admitting: *Deleted

## 2019-03-10 DIAGNOSIS — M7989 Other specified soft tissue disorders: Secondary | ICD-10-CM

## 2019-03-10 DIAGNOSIS — Z8505 Personal history of malignant neoplasm of liver: Secondary | ICD-10-CM

## 2019-03-10 NOTE — Telephone Encounter (Signed)
Call received from patient stating that she is having increased swelling to bilateral legs, right greater than left.  Dr. Marin Olp notified and order received for pt to have bilateral dopplers.  Order placed by S. [place] NP and patient notified.

## 2019-03-11 ENCOUNTER — Other Ambulatory Visit: Payer: Self-pay

## 2019-03-11 ENCOUNTER — Ambulatory Visit (HOSPITAL_BASED_OUTPATIENT_CLINIC_OR_DEPARTMENT_OTHER)
Admission: RE | Admit: 2019-03-11 | Discharge: 2019-03-11 | Disposition: A | Discharge status: Home / Self Care | Payer: PPO | Source: Ambulatory Visit | Attending: Family | Admitting: Family

## 2019-03-11 ENCOUNTER — Other Ambulatory Visit: Payer: Self-pay | Admitting: Family

## 2019-03-11 DIAGNOSIS — I82411 Acute embolism and thrombosis of right femoral vein: Secondary | ICD-10-CM | POA: Diagnosis not present

## 2019-03-11 DIAGNOSIS — Z8505 Personal history of malignant neoplasm of liver: Secondary | ICD-10-CM | POA: Diagnosis not present

## 2019-03-11 DIAGNOSIS — I82409 Acute embolism and thrombosis of unspecified deep veins of unspecified lower extremity: Secondary | ICD-10-CM | POA: Insufficient documentation

## 2019-03-11 DIAGNOSIS — M7989 Other specified soft tissue disorders: Secondary | ICD-10-CM | POA: Diagnosis not present

## 2019-03-11 DIAGNOSIS — I82431 Acute embolism and thrombosis of right popliteal vein: Secondary | ICD-10-CM | POA: Diagnosis not present

## 2019-03-11 MED ORDER — RIVAROXABAN (XARELTO) VTE STARTER PACK (15 & 20 MG): ORAL_TABLET | ORAL | 0 refills | Status: DC

## 2019-03-11 MED ORDER — RIVAROXABAN 20 MG PO TABS: 20.0000 mg | ORAL_TABLET | Freq: Every day | ORAL | 3 refills | Status: DC

## 2019-03-11 MED FILL — XARELTO STARTER PACK: 15 & 20 | 30 days supply | Qty: 30 | Fill #0

## 2019-03-11 NOTE — Progress Notes (Unsigned)
Beverly Davis came in today for [place] of both legs. She states that she ***

## 2019-03-12 ENCOUNTER — Other Ambulatory Visit (HOSPITAL_BASED_OUTPATIENT_CLINIC_OR_DEPARTMENT_OTHER): Payer: PPO | Admitting: Family

## 2019-03-12 ENCOUNTER — Telehealth: Payer: Self-pay | Admitting: *Deleted

## 2019-03-12 DIAGNOSIS — I82411 Acute embolism and thrombosis of right femoral vein: Secondary | ICD-10-CM | POA: Diagnosis not present

## 2019-03-12 DIAGNOSIS — I82811 Embolism and thrombosis of superficial veins of right lower extremities: Secondary | ICD-10-CM

## 2019-03-12 DIAGNOSIS — Z5112 Encounter for antineoplastic immunotherapy: Secondary | ICD-10-CM | POA: Diagnosis not present

## 2019-03-12 MED ORDER — RIVAROXABAN 20 MG PO TABS: 20.0000 mg | ORAL_TABLET | Freq: Every day | ORAL | 6 refills | Status: DC

## 2019-03-12 NOTE — Progress Notes (Signed)
Ms. Leu was diagnosed yesterday with an occlusive DVT involving the right femoral vein extending through the popliteal vein as well as both the right peroneal and posterior tibial veins. She also had an occlusive superficial thrombophlebitis involving the right lesser saphenous vein of the right leg. Left leg was negative.  We will get her her onto a Xareto started pack. She was able to receive counseling on this drug along with her husband by our wonderful pharmacy downstairs.  I also called to update her son per Dr. Antonieta Pert request.  She did fill out financial assistance paper work and I filled out our forms as well.  We will plan to see her back on 12/30. They will contact our office with any questions or concerns.

## 2019-03-12 NOTE — Telephone Encounter (Signed)
Received call from patient stating that she started the Xarelto yesterday and started having bleeding gums today when she brushed her teeth.  Dr. Marin Olp notified.  Platelets 88 on last visit in 02/25/2019.  Dr. Marin Olp encouraged patient to keep monitoring but stay on the Xarelto.  Keep [place] posted of any further bleeding.  Brush gently and use baking soda and water to swish around.

## 2019-03-15 ENCOUNTER — Encounter (HOSPITAL_BASED_OUTPATIENT_CLINIC_OR_DEPARTMENT_OTHER): Payer: Self-pay

## 2019-03-15 ENCOUNTER — Emergency Department (HOSPITAL_BASED_OUTPATIENT_CLINIC_OR_DEPARTMENT_OTHER)
Admission: EM | Admit: 2019-03-15 | Discharge: 2019-03-15 | Disposition: A | Discharge status: Home / Self Care | Payer: PPO | Attending: Emergency Medicine | Admitting: Emergency Medicine

## 2019-03-15 ENCOUNTER — Other Ambulatory Visit: Payer: Self-pay

## 2019-03-15 ENCOUNTER — Telehealth: Payer: Self-pay | Admitting: *Deleted

## 2019-03-15 DIAGNOSIS — K921 Melena: Secondary | ICD-10-CM | POA: Diagnosis not present

## 2019-03-15 DIAGNOSIS — E119 Type 2 diabetes mellitus without complications: Secondary | ICD-10-CM | POA: Insufficient documentation

## 2019-03-15 DIAGNOSIS — Z7984 Long term (current) use of oral hypoglycemic drugs: Secondary | ICD-10-CM | POA: Diagnosis not present

## 2019-03-15 DIAGNOSIS — Z79899 Other long term (current) drug therapy: Secondary | ICD-10-CM | POA: Insufficient documentation

## 2019-03-15 DIAGNOSIS — I10 Essential (primary) hypertension: Secondary | ICD-10-CM | POA: Insufficient documentation

## 2019-03-15 DIAGNOSIS — F0390 Unspecified dementia without behavioral disturbance: Secondary | ICD-10-CM | POA: Diagnosis not present

## 2019-03-15 DIAGNOSIS — K625 Hemorrhage of anus and rectum: Secondary | ICD-10-CM | POA: Diagnosis present

## 2019-03-15 DIAGNOSIS — E039 Hypothyroidism, unspecified: Secondary | ICD-10-CM | POA: Diagnosis not present

## 2019-03-15 DIAGNOSIS — R195 Other fecal abnormalities: Secondary | ICD-10-CM | POA: Diagnosis not present

## 2019-03-15 LAB — COMPREHENSIVE METABOLIC PANEL
ALT: 48 U/L — ABNORMAL HIGH (ref 0–44)
AST: 80 U/L — ABNORMAL HIGH (ref 15–41)
Albumin: 2.5 g/dL — ABNORMAL LOW (ref 3.5–5.0)
Alkaline Phosphatase: 135 U/L — ABNORMAL HIGH (ref 38–126)
Anion gap: 9 (ref 5–15)
BUN: 12 mg/dL (ref 8–23)
CO2: 21 mmol/L — ABNORMAL LOW (ref 22–32)
Calcium: 9.1 mg/dL (ref 8.9–10.3)
Chloride: 106 mmol/L (ref 98–111)
Creatinine, Ser: 0.62 mg/dL (ref 0.44–1.00)
GFR calc Af Amer: >60 mL/min (ref >60)
GFR calc non Af Amer: >60 mL/min (ref >60)
Glucose, Bld: 290 mg/dL — ABNORMAL HIGH (ref 70–99)
Potassium: 3.5 mmol/L (ref 3.5–5.1)
Sodium: 136 mmol/L (ref 135–145)
Total Bilirubin: 2.2 mg/dL — ABNORMAL HIGH (ref 0.3–1.2)
Total Protein: 5.7 g/dL — ABNORMAL LOW (ref 6.5–8.1)

## 2019-03-15 LAB — CBC WITH DIFFERENTIAL/PLATELET
Abs Immature Granulocytes: 0.03 10*3/uL (ref 0.00–0.07)
Basophils Absolute: 0 10*3/uL (ref 0.0–0.1)
Basophils Relative: 1 %
Eosinophils Absolute: 0.1 10*3/uL (ref 0.0–0.5)
Eosinophils Relative: 3 %
HCT: 32.7 % — ABNORMAL LOW (ref 36.0–46.0)
Hemoglobin: 10.9 g/dL — ABNORMAL LOW (ref 12.0–15.0)
Immature Granulocytes: 1 %
Lymphocytes Relative: 23 %
Lymphs Abs: 0.5 10*3/uL — ABNORMAL LOW (ref 0.7–4.0)
MCH: 36.8 pg — ABNORMAL HIGH (ref 26.0–34.0)
MCHC: 33.3 g/dL (ref 30.0–36.0)
MCV: 110.5 fL — ABNORMAL HIGH (ref 80.0–100.0)
Monocytes Absolute: 0.4 10*3/uL (ref 0.1–1.0)
Monocytes Relative: 17 %
Neutro Abs: 1.2 10*3/uL — ABNORMAL LOW (ref 1.7–7.7)
Neutrophils Relative %: 55 %
Platelets: 87 10*3/uL — ABNORMAL LOW (ref 150–400)
RBC: 2.96 MIL/uL — ABNORMAL LOW (ref 3.87–5.11)
RDW: 16.1 % — ABNORMAL HIGH (ref 11.5–15.5)
WBC: 2.2 10*3/uL — ABNORMAL LOW (ref 4.0–10.5)
nRBC: 0 % (ref 0.0–0.2)

## 2019-03-15 LAB — URINALYSIS, ROUTINE W REFLEX MICROSCOPIC
Bilirubin Urine: NEGATIVE
Glucose, UA: >=500 mg/dL — AB
Ketones, ur: NEGATIVE mg/dL
Leukocytes,Ua: NEGATIVE
Nitrite: NEGATIVE
Protein, ur: NEGATIVE mg/dL
Specific Gravity, Urine: >1.03 — ABNORMAL HIGH (ref 1.005–1.030)
pH: 6 (ref 5.0–8.0)

## 2019-03-15 LAB — URINALYSIS, MICROSCOPIC (REFLEX)

## 2019-03-15 LAB — PROTIME-INR
INR: 4 — ABNORMAL HIGH (ref 0.8–1.2)
Prothrombin Time: 39 seconds — ABNORMAL HIGH (ref 11.4–15.2)

## 2019-03-15 LAB — LIPASE, BLOOD: Lipase: 49 U/L (ref 11–51)

## 2019-03-15 LAB — OCCULT BLOOD X 1 CARD TO LAB, STOOL: Fecal Occult Bld: POSITIVE — AB

## 2019-03-15 NOTE — ED Notes (Signed)
Assisted patient to bedside commode; no rectal bleeding noted. Tolerated well.

## 2019-03-15 NOTE — Telephone Encounter (Signed)
Patient called stating she has started having black stools in past few days.  Last week she was having bleeding gums but this is a little better after swishing with baking soda and water.  Patient also states that there was blood on her toilet paper after urinating this morning.  Dr. Marin Olp notified.  Dr Marin Olp wants patient to go to the ED to be evaluated.  PAtietn understands this and will try to get a ride.

## 2019-03-15 NOTE — ED Triage Notes (Signed)
Pt c/o dark stools x 1 week-rectal bleeding this am-states she was advised to come to ED by Oncology-NAD-to triage in w/c

## 2019-03-15 NOTE — Discharge Instructions (Addendum)
Work-up here today without evidence of any significant GI bleed.  Stool was brown in color and was heme positive but no gross blood.  Urine had no blood in it.  Hemoglobin is at baseline.  Has a persistent leukopenia.  Platelet counts without change.  Recommend contacting her hematologist oncologist is to take a look at her labs.  Patient will need to return for any blood in the commode water staining the water all red that occurs 2-3 times in a day.  Or for any new or worse symptoms.  No evidence of urinary tract infection.

## 2019-03-15 NOTE — ED Provider Notes (Signed)
[facility] EMERGENCY DEPARTMENT Provider Note   CSN: [id number] Arrival date & time: 03/15/19  1500     History Chief Complaint  Patient presents with  . Rectal Bleeding    Beverly Davis is a 78 y.o. female.  Patient with a history of dark stools for a week.  Had some blood in the commode water after urinating did not have a bowel movement at that time.  Patient does not think it came from the rectum.  Patient was advised to come to the emergency department from her oncologist Dr. Marin Olp.  Patient recently started on Xarelto.  Following a deep vein thrombosis to her right leg.  Patient is currently being followed for hepatocellular carcinoma.  Also has diabetes.  Does have a history of dementia.  History of cirrhosis and a history of breast cancer in 2008 on the right side.  Currently being followed by hematology for the hepatocellular carcinoma.  From the hematology oncology notes patient is currently being actively treated for the hepatocellular carcinoma.  Patient is hard of hearing.  But cannot communicate and does follow commands well.  Does have a history of dementia.        Past Medical History:  Diagnosis Date  . Breast cancer ([place]) 05/2006   Right breast cancer  . Cirrhosis ([place])   . Dementia ([place])   . Diabetes mellitus without complication ([place])    type 2  . Fibroids    PT IS UNAWARE   . Goals of care, counseling/discussion 09/17/2018  . Hepatocellular carcinoma ([place]) 05/08/2016  . Hypothyroidism   . Osteopenia   . Personal history of radiation therapy 2008  . Thrombocytopenia ([place])   . Thyroid disease     Patient Active Problem List   Diagnosis Date Noted  . DVT (deep venous thrombosis) ([place]) 03/11/2019  . Functional urinary incontinence 01/11/2019  . Compression fracture of C1 vertebra ([place]) 01/07/2019  . Murmur 01/04/2019  . Goals of care, counseling/discussion 09/17/2018  . History of hepatocellular carcinoma 07/29/2018  . Liver cirrhosis  secondary to NASH ([place]) 07/29/2018  . FHx: heart disease 07/29/2018  . B12 deficiency 07/29/2018  . Pulmonary nodule 04/08/2018  . Hepatoma ([place]) 01/21/2018  . Poor compliance 11/26/2017  . Hepatocellular carcinoma ([place]) 05/08/2016  . Pancytopenia ([place])   . Hepatic encephalopathy ([place]) 03/18/2016  . Hyperlipidemia associated with type 2 diabetes mellitus ([place]) 12/14/2014  . Hypothyroidism 05/18/2014  . Essential hypertension 05/18/2014  . Vitamin D deficiency 05/18/2014  . Encounter for general adult medical examination with abnormal findings 05/18/2014  . Type 2 diabetes mellitus with stage 2 chronic kidney disease, without long-term current use of insulin ([place]) 01/12/2014  . Osteoporosis   . History of right breast cancer 09/30/2012    Past Surgical History:  Procedure Laterality Date  . ABDOMINAL HYSTERECTOMY     TAH BSO  . BREAST LUMPECTOMY Right 2008  . BREAST LUMPECTOMY WITH AXILLARY LYMPH NODE BIOPSY Right 06/25/2006  . CHOLECYSTECTOMY    . IR GENERIC HISTORICAL  03/28/2016   IR RADIOLOGIST EVAL & MGMT 03/28/2016 Corrie Mckusick, DO [facility] INTER[facility]  . IR GENERIC HISTORICAL  05/01/2016   IR ANGIOGRAM VISCERAL SELECTIVE 05/01/2016 Corrie Mckusick, DO [facility]  . IR GENERIC HISTORICAL  05/01/2016   IR ANGIOGRAM VISCERAL SELECTIVE 05/01/2016 Corrie Mckusick, DO [facility]  . IR GENERIC HISTORICAL  05/01/2016   IR ANGIOGRAM SELECTIVE EACH ADDITIONAL VESSEL 05/01/2016 Corrie Mckusick, DO [facility]  . IR GENERIC HISTORICAL  05/01/2016  IR ANGIOGRAM SELECTIVE EACH ADDITIONAL VESSEL 05/01/2016 Corrie Mckusick, DO [facility]  . IR GENERIC HISTORICAL  05/01/2016   IR ANGIOGRAM SELECTIVE EACH ADDITIONAL VESSEL 05/01/2016 Corrie Mckusick, DO [facility]  . IR GENERIC HISTORICAL  05/01/2016   IR EMBO TUMOR ORGAN ISCHEMIA INFARCT INC GUIDE ROADMAPPING 05/01/2016 Corrie Mckusick, DO [facility]  . IR GENERIC HISTORICAL  05/01/2016   IR CT SPINE LTD 05/01/2016 Corrie Mckusick, DO [facility]  . IR GENERIC HISTORICAL   05/01/2016   IR US GUIDE VASC ACCESS RIGHT 05/01/2016 Corrie Mckusick, DO [facility]  . IR GENERIC HISTORICAL  05/01/2016   IR ANGIOGRAM SELECTIVE EACH ADDITIONAL VESSEL 05/01/2016 Corrie Mckusick, DO [facility]  . IR IMAGING GUIDED PORT INSERTION  09/28/2018  . IR RADIOLOGIST EVAL & MGMT  07/31/2016  . IR RADIOLOGIST EVAL & MGMT  12/25/2016  . IR RADIOLOGIST EVAL & MGMT  12/10/2017  . IR RADIOLOGIST EVAL & MGMT  04/21/2018  . IR RADIOLOGIST EVAL & MGMT  08/14/2018  . RADIOFREQUENCY ABLATION N/A 06/14/2016   Procedure: LIVER MICROWAVE THERMAL  ABLATION;  Surgeon: Corrie Mckusick, DO;  Location: [facility];  Service: Anesthesiology;  Laterality: N/A;  . RADIOLOGY WITH ANESTHESIA Right 01/21/2018   Procedure: CT WITH ANESTHESIA/MICROWAVE THERMAL ABLATION OF LIVER;  Surgeon: Corrie Mckusick, DO;  Location: [facility];  Service: Radiology;  Laterality: Right;  . TUBAL LIGATION  1978     OB History   No obstetric history on file.     Family History  Problem Relation Age of Onset  . Cancer Mother        Brain cancer  . Cancer Father        Prostate cancer  . Diabetes Sister   . Hypertension Sister   . Liver disease Brother   . Diabetes Maternal Aunt   . Heart Problems Maternal Grandfather   . Cancer Paternal Grandfather        Unknown cancer  . Diabetes Sister     Social History   Tobacco Use  . Smoking status: Never Smoker  . Smokeless tobacco: Never Used  Substance Use Topics  . Alcohol use: No  . Drug use: No    Home Medications Prior to Admission medications   Medication Sig Start Date End Date Taking? Authorizing Provider  Cholecalciferol (VITAMIN D3) 5000 units CAPS Take 5,000 Units by mouth daily.    [provider]  cyanocobalamin 1000 MCG tablet Take 1,000 mcg by mouth daily.    [provider]  glucose blood (ONETOUCH ULTRA) test strip Check blood Sugar 3 x  /day before Meals 01/09/19   Unk Pinto, MD  lactulose (CHRONULAC) 10 GM/15ML solution Take 30 mLs (20 g  total) by mouth 3 (three) times daily. Patient taking differently: Take by mouth daily. Takes 20 mls. Daily. 04/03/16   Rolene Course, PA-C  Lancets (ONETOUCH DELICA PLUS [id number]) MISC 300 Wafers by Does not apply route 3 (three) times daily before meals. Check blood sugar 3 x  /day 01/09/19   Unk Pinto, MD  levothyroxine (SYNTHROID) 112 MCG tablet Take 1 tablet daily on an empty stomach with only water for 30 minutes & no Antacid meds, Calcium or Magnesium for 4 hours & avoid Biotin 12/02/18   Unk Pinto, MD  lidocaine-prilocaine (EMLA) cream Apply to affected area once 09/29/18   [name], MD  magnesium oxide (MAG-OX) 400 MG tablet Take 400 mg by mouth daily.     [provider]  meclizine (ANTIVERT) 25  MG tablet 1/2-1 pill up to 3 times daily as needed dizziness. 02/24/19   Liane Comber, NP  metFORMIN (GLUCOPHAGE) 500 MG tablet Take 1 tablet with Breakfast & Lunch and 2 tablets with supper for Diabetes 09/10/18   Unk Pinto, MD  ondansetron (ZOFRAN) 8 MG tablet Take 1 tablet (8 mg total) by mouth 2 (two) times daily as needed (Nausea or vomiting). 09/29/18   [name], MD  prochlorperazine (COMPAZINE) 10 MG tablet Take 1 tablet (10 mg total) by mouth every 6 (six) hours as needed (Nausea or vomiting). 09/29/18   [name], MD  rifaximin (XIFAXAN) 550 MG TABS tablet Take 550 mg by mouth 2 (two) times daily.    [provider]  rivaroxaban (XARELTO) 20 MG TABS tablet Take 1 tablet (20 mg total) by mouth daily with supper. 03/12/19   [place], Holli Humbles, NP  Rivaroxaban 15 & 20 MG TBPK Follow package directions: Take one 59m tablet by mouth twice a day. On day 22, switch to one 232mtablet once a day. Take with food. 03/11/19   [place], SaHolli HumblesNP  sitaGLIPtin (JANUVIA) 100 MG tablet Take 100 mg by mouth daily.    [provider]    Allergies    Metformin and related and Welchol [colesevelam hcl]  Review of Systems   Review of  Systems  Constitutional: Negative for chills and fever.  HENT: Positive for hearing loss. Negative for congestion, rhinorrhea and sore throat.   Eyes: Negative for visual disturbance.  Respiratory: Negative for cough and shortness of breath.   Cardiovascular: Negative for chest pain and leg swelling.  Gastrointestinal: Positive for blood in stool. Negative for abdominal pain, diarrhea, nausea and vomiting.  Genitourinary: Positive for hematuria. Negative for dysuria.  Musculoskeletal: Negative for back pain and neck pain.  Skin: Negative for rash.  Neurological: Negative for dizziness, light-headedness and headaches.  Hematological: Bruises/bleeds easily.  Psychiatric/Behavioral: Positive for confusion.    Physical Exam Updated Vital Signs BP 125/72 (BP Location: Right Arm)   Pulse 84   Temp 98.3 F (36.8 C) (Oral)   Resp 17   SpO2 96%   Physical Exam Vitals and nursing note reviewed.  Constitutional:      General: She is not in acute distress.    Appearance: Normal appearance. She is well-developed.  HENT:     Head: Normocephalic and atraumatic.  Eyes:     Extraocular Movements: Extraocular movements intact.     Conjunctiva/sclera: Conjunctivae normal.     Pupils: Pupils are equal, round, and reactive to light.  Cardiovascular:     Rate and Rhythm: Normal rate and regular rhythm.     Heart sounds: No murmur.  Pulmonary:     Effort: Pulmonary effort is normal. No respiratory distress.     Breath sounds: Normal breath sounds.  Abdominal:     Palpations: Abdomen is soft.     Tenderness: There is no abdominal tenderness.  Genitourinary:    Rectum: Normal. Guaiac result positive.     Comments: Rectal exam stool brown in color no gross blood.  No external hemorrhoids no prolapsed internal hemorrhoids.  It was heme positive but no gross blood.  Not black in color. Musculoskeletal:     Cervical back: Normal range of motion and neck supple.     Right lower leg: Edema present.      Comments: Patient still has fairly significant swelling to the right lower extremity.  But has good cap refill and dorsalis pedis  pulse is 1-2+ in both feet.  No swelling to the left leg.  Skin:    General: Skin is warm and dry.  Neurological:     General: No focal deficit present.     Mental Status: She is alert. Mental status is at baseline.     ED Results / Procedures / Treatments   Labs (all labs ordered are listed, but only abnormal results are displayed) Labs Reviewed  COMPREHENSIVE METABOLIC PANEL - Abnormal; Notable for the following components:      Result Value   CO2 21 (*)    Glucose, Bld 290 (*)    Total Protein 5.7 (*)    Albumin 2.5 (*)    AST 80 (*)    ALT 48 (*)    Alkaline Phosphatase 135 (*)    Total Bilirubin 2.2 (*)    All other components within normal limits  CBC WITH DIFFERENTIAL/PLATELET - Abnormal; Notable for the following components:   WBC 2.2 (*)    RBC 2.96 (*)    Hemoglobin 10.9 (*)    HCT 32.7 (*)    MCV 110.5 (*)    MCH 36.8 (*)    RDW 16.1 (*)    Platelets 87 (*)    Neutro Abs 1.2 (*)    Lymphs Abs 0.5 (*)    All other components within normal limits  PROTIME-INR - Abnormal; Notable for the following components:   Prothrombin Time 39.0 (*)    INR 4.0 (*)    All other components within normal limits  URINALYSIS, ROUTINE W REFLEX MICROSCOPIC - Abnormal; Notable for the following components:   Specific Gravity, Urine >1.030 (*)    Glucose, UA >=500 (*)    Hgb urine dipstick TRACE (*)    All other components within normal limits  OCCULT BLOOD X 1 CARD TO LAB, STOOL - Abnormal; Notable for the following components:   Fecal Occult Bld POSITIVE (*)    All other components within normal limits  URINALYSIS, MICROSCOPIC (REFLEX) - Abnormal; Notable for the following components:   Bacteria, UA FEW (*)    All other components within normal limits  LIPASE, BLOOD    EKG None  Radiology No results found.  Procedures Procedures  (including critical care time)  Medications Ordered in ED Medications - No data to display  ED Course  I have reviewed the triage vital signs and the nursing notes.  Pertinent labs & imaging results that were available during my care of the patient were reviewed by me and considered in my medical decision making (see chart for details).    MDM Rules/Calculators/A&P                      Work-up here no significant change in her hemoglobin.  Patient does have heme positive stool but no melena no gross blood.  Also no blood in the urine urine not consistent with urinary tract infection.  Patient recently started on Xarelto for the right deep vein thrombosis.  Patient without any significant changes in her breathing or significant chest pain.  Patient's labs pretty much baseline.  We will have her follow back up with her hematologist oncologist.  Precautions provided to return for gross blood in the toilet water 2 or 3 times a day.  But no evidence of any significant GI bleed here today.    Final Clinical Impression(s) / ED Diagnoses Final diagnoses:  Heme positive stool    Rx / DC Orders ED Discharge  Orders    None       Fredia Sorrow, MD 03/15/19 2126

## 2019-03-16 ENCOUNTER — Telehealth: Payer: Self-pay | Admitting: Hematology & Oncology

## 2019-03-16 NOTE — Telephone Encounter (Signed)
Called and spoke with patient regarding appointments added per 12/22 sch msg

## 2019-03-17 ENCOUNTER — Other Ambulatory Visit: Payer: Self-pay

## 2019-03-17 ENCOUNTER — Inpatient Hospital Stay: Payer: PPO

## 2019-03-17 ENCOUNTER — Inpatient Hospital Stay (HOSPITAL_BASED_OUTPATIENT_CLINIC_OR_DEPARTMENT_OTHER): Payer: PPO | Admitting: Hematology & Oncology

## 2019-03-17 ENCOUNTER — Other Ambulatory Visit: Payer: Self-pay | Admitting: Radiology

## 2019-03-17 ENCOUNTER — Encounter: Payer: Self-pay | Admitting: Hematology & Oncology

## 2019-03-17 VITALS — BP 128/59 | HR 97 | Temp 97.7°F | Resp 19 | Wt 138.8 lb

## 2019-03-17 DIAGNOSIS — I82401 Acute embolism and thrombosis of unspecified deep veins of right lower extremity: Secondary | ICD-10-CM | POA: Diagnosis not present

## 2019-03-17 DIAGNOSIS — C220 Liver cell carcinoma: Secondary | ICD-10-CM | POA: Diagnosis not present

## 2019-03-17 DIAGNOSIS — D509 Iron deficiency anemia, unspecified: Secondary | ICD-10-CM

## 2019-03-17 DIAGNOSIS — K7581 Nonalcoholic steatohepatitis (NASH): Secondary | ICD-10-CM

## 2019-03-17 DIAGNOSIS — E1169 Type 2 diabetes mellitus with other specified complication: Secondary | ICD-10-CM

## 2019-03-17 DIAGNOSIS — K7460 Unspecified cirrhosis of liver: Secondary | ICD-10-CM

## 2019-03-17 DIAGNOSIS — E1122 Type 2 diabetes mellitus with diabetic chronic kidney disease: Secondary | ICD-10-CM

## 2019-03-17 DIAGNOSIS — Z5112 Encounter for antineoplastic immunotherapy: Secondary | ICD-10-CM | POA: Diagnosis not present

## 2019-03-17 DIAGNOSIS — N182 Chronic kidney disease, stage 2 (mild): Secondary | ICD-10-CM

## 2019-03-17 DIAGNOSIS — Z95828 Presence of other vascular implants and grafts: Secondary | ICD-10-CM

## 2019-03-17 DIAGNOSIS — Z8505 Personal history of malignant neoplasm of liver: Secondary | ICD-10-CM

## 2019-03-17 LAB — CBC WITH DIFFERENTIAL (CANCER CENTER ONLY)
Abs Immature Granulocytes: 0.01 10*3/uL (ref 0.00–0.07)
Basophils Absolute: 0 10*3/uL (ref 0.0–0.1)
Basophils Relative: 1 %
Eosinophils Absolute: 0.1 10*3/uL (ref 0.0–0.5)
Eosinophils Relative: 3 %
HCT: 29.4 % — ABNORMAL LOW (ref 36.0–46.0)
Hemoglobin: 9.8 g/dL — ABNORMAL LOW (ref 12.0–15.0)
Immature Granulocytes: 0 %
Lymphocytes Relative: 20 %
Lymphs Abs: 0.5 10*3/uL — ABNORMAL LOW (ref 0.7–4.0)
MCH: 36.4 pg — ABNORMAL HIGH (ref 26.0–34.0)
MCHC: 33.3 g/dL (ref 30.0–36.0)
MCV: 109.3 fL — ABNORMAL HIGH (ref 80.0–100.0)
Monocytes Absolute: 0.3 10*3/uL (ref 0.1–1.0)
Monocytes Relative: 14 %
Neutro Abs: 1.5 10*3/uL — ABNORMAL LOW (ref 1.7–7.7)
Neutrophils Relative %: 62 %
Platelet Count: 83 10*3/uL — ABNORMAL LOW (ref 150–400)
RBC: 2.69 MIL/uL — ABNORMAL LOW (ref 3.87–5.11)
RDW: 15.9 % — ABNORMAL HIGH (ref 11.5–15.5)
WBC Count: 2.4 10*3/uL — ABNORMAL LOW (ref 4.0–10.5)
nRBC: 0 % (ref 0.0–0.2)

## 2019-03-17 LAB — LIPID PANEL
Cholesterol: 136 mg/dL (ref 0–200)
HDL: 34 mg/dL — ABNORMAL LOW (ref >40)
LDL Cholesterol: 78 mg/dL (ref 0–99)
Total CHOL/HDL Ratio: 4 RATIO
Triglycerides: 121 mg/dL (ref <150)
VLDL: 24 mg/dL (ref 0–40)

## 2019-03-17 LAB — CMP (CANCER CENTER ONLY)
ALT: 43 U/L (ref 0–44)
AST: 73 U/L — ABNORMAL HIGH (ref 15–41)
Albumin: 2.7 g/dL — ABNORMAL LOW (ref 3.5–5.0)
Alkaline Phosphatase: 119 U/L (ref 38–126)
Anion gap: 10 (ref 5–15)
BUN: 14 mg/dL (ref 8–23)
CO2: 20 mmol/L — ABNORMAL LOW (ref 22–32)
Calcium: 8.9 mg/dL (ref 8.9–10.3)
Chloride: 105 mmol/L (ref 98–111)
Creatinine: 0.51 mg/dL (ref 0.44–1.00)
GFR, Est AFR Am: >60 mL/min (ref >60)
GFR, Estimated: >60 mL/min (ref >60)
Glucose, Bld: 220 mg/dL — ABNORMAL HIGH (ref 70–99)
Potassium: 3.8 mmol/L (ref 3.5–5.1)
Sodium: 135 mmol/L (ref 135–145)
Total Bilirubin: 2.1 mg/dL — ABNORMAL HIGH (ref 0.3–1.2)
Total Protein: 5.6 g/dL — ABNORMAL LOW (ref 6.5–8.1)

## 2019-03-17 LAB — LACTATE DEHYDROGENASE: LDH: 393 U/L — ABNORMAL HIGH (ref 98–192)

## 2019-03-17 LAB — AMMONIA: Ammonia: 79 umol/L — ABNORMAL HIGH (ref 9–35)

## 2019-03-17 MED ORDER — SODIUM CHLORIDE 0.9% FLUSH
10.0000 mL | INTRAVENOUS | Status: DC | PRN
  Administered 2019-03-17: 10 mL via INTRAVENOUS
  Filled 2019-03-17: qty 10

## 2019-03-17 MED ORDER — HEPARIN SOD (PORK) LOCK FLUSH 100 UNIT/ML IV SOLN
500.0000 [IU] | Freq: Once | INTRAVENOUS | Status: AC
  Administered 2019-03-17: 500 [IU] via INTRAVENOUS
  Filled 2019-03-17: qty 5

## 2019-03-17 NOTE — Progress Notes (Signed)
Hematology and Oncology Follow Up Visit  Beverly Davis [id number] Jun 27, 1940 78 y.o. 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 is here today for for early visit.  Unfortunately, we found that she had a blood clot in her right leg last week.  She has swelling in her legs.  We went ahead and got a Doppler.  This showed a thrombus in the distal aspect of the right femoral vein extending through the popliteal vein and right peroneal/posterior tibial vein.  We put her on Xarelto.  Unfortunately, about 3 to 4 days later, she began to have bleeding.  She initially had epistaxis.  She then had melena.  We had her go to the emergency room.  She was discharged from the emergency room as they felt she was stable.  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  I spoke to her gastroenterologist.  Actually, I spoke to his partner, Dr. Collene Mares.  They will get her in for an endoscopy next week.  She will need to have a IVC filter placed.  I do still think that she is going to be a candidate for anticoagulation.  I spoke with Dr. Markus Daft of interventional radiology.  As always, they do a fantastic job and will get her in on Thursday, 03/18/2019, for an IVC filter.  She has dementia.  She has very little memory.  Thankfully we could find her sister and her sister was with her.  We gave all the information to her sister.  I think she will need a compression stocking for the right leg.  I gave her sister a prescription for the  compression stocking and the address of the company that will fit this and make this for her.  There is no obvious melena right now.  There is no hematochezia.  She has had a little cough.  This is a dry cough.  Para she has had a thoracentesis done which was from her cirrhosis and not malignant.  She really wants to be home for Christmas.  I understand this.  As far as her hepatocellular carcinoma is concerned, I think this is holding relatively steady for right now.  Her last alpha-fetoprotein was 200.  Currently, her performance status is ECOG 1.  Medications:  Allergies as of 03/17/2019      Reactions   Metformin And Related Other (See Comments)   Unknown   Welchol [colesevelam Hcl] Other (See Comments)   Unknown      Medication List       Accurate as of March 17, 2019  2:01 PM. If you have any questions, ask your nurse or doctor.        STOP taking these medications   Rivaroxaban 15 & 20 MG Tbpk Stopped by: [name], MD   rivaroxaban 20 MG Tabs tablet Commonly known as: Xarelto Stopped by: [name], MD     TAKE these medications   cyanocobalamin 1000 MCG tablet Take 1,000 mcg by mouth daily.   Januvia 100 MG tablet Generic drug: sitaGLIPtin Take 100 mg  by mouth daily.   lactulose 10 GM/15ML solution Commonly known as: CHRONULAC Take 30 mLs (20 g total) by mouth 3 (three) times daily.   levothyroxine 112 MCG tablet Commonly known as: Synthroid Take 1 tablet daily on an empty stomach with only water for 30 minutes & no Antacid meds, Calcium or Magnesium for 4 hours & avoid Biotin   lidocaine-prilocaine cream Commonly known as: EMLA Apply to affected area once   magnesium oxide 400 MG tablet Commonly known as: MAG-OX Take 400 mg by mouth daily.   meclizine 25 MG tablet Commonly known as: ANTIVERT 1/2-1 pill up to 3 times daily as needed dizziness.   metFORMIN 500 MG tablet Commonly known as: Glucophage Take 1 tablet with Breakfast  & Lunch and 2 tablets with supper for Diabetes   ondansetron 8 MG tablet Commonly known as: Zofran Take 1 tablet (8 mg total) by mouth 2 (two) times daily as needed (Nausea or vomiting).   OneTouch Delica Plus [id number] Misc 300 Wafers by Does not apply route 3 (three) times daily before meals. Check blood sugar 3 x  /day   OneTouch Ultra test strip Generic drug: glucose blood Check blood Sugar 3 x  /day before Meals   prochlorperazine 10 MG tablet Commonly known as: COMPAZINE Take 1 tablet (10 mg total) by mouth every 6 (six) hours as needed (Nausea or vomiting).   rifaximin 550 MG Tabs tablet Commonly known as: XIFAXAN Take 550 mg by mouth 2 (two) times daily.   Vitamin D3 125 MCG (5000 UT) Caps Take 5,000 Units by mouth daily.       Allergies:  Allergies  Allergen Reactions  . Metformin And Related Other (See Comments)    Unknown  . Welchol [Colesevelam Hcl] Other (See Comments)    Unknown    Past Medical History, Surgical history, Social history, and Family History were reviewed and updated.  Review of Systems: Review of Systems  Constitutional: Positive for malaise/fatigue.  HENT: Negative.   Eyes: Negative.   Respiratory: Positive for shortness of breath.   Cardiovascular: Positive for leg swelling.  Gastrointestinal: Positive for constipation.  Genitourinary: Negative.   Musculoskeletal: Positive for back pain.  Skin: Negative.   Neurological: Negative.   Endo/Heme/Allergies: Negative.   Psychiatric/Behavioral: Positive for memory loss.     Physical Exam:  weight is 138 lb 12.8 oz (63 kg). Her temporal temperature is 97.7 F (36.5 C). Her blood pressure is 128/59 (abnormal) and her pulse is 97. Her respiration is 19 and oxygen saturation is 99%.   Wt Readings from Last 3 Encounters:  03/17/19 138 lb 12.8 oz (63 kg)  02/25/19 139 lb 1.9 oz (63.1 kg)  02/25/19 139 lb 1.9 oz (63.1 kg)    Physical Exam Vitals reviewed.  HENT:     Head:  Normocephalic and atraumatic.  Eyes:     Pupils: Pupils are equal, round, and reactive to light.  Cardiovascular:     Rate and Rhythm: Normal rate and regular rhythm.     Heart sounds: Normal heart sounds.     Comments: Cardiac exam regular rate and rhythm with a normal S1 and S2.  She has a 1/6 systolic ejection murmur. Pulmonary:     Effort: Pulmonary effort is normal.     Breath sounds: Normal breath sounds.     Comments: Pulmonary exam shows decreased breath sounds on the right side.  She has decent breath sounds on the left side.  No wheezing is noted. Abdominal:  General: Bowel sounds are normal.     Palpations: Abdomen is soft.     Comments: Abdominal exam shows a somewhat distended abdomen.  She has what appears to be ascites.  There is no guarding.  She has no tenderness.  There is no obvious hepatomegaly or splenomegaly.  Musculoskeletal:        General: No tenderness or deformity. Normal range of motion.     Cervical back: Normal range of motion.     Comments: Extremities shows some mild edema in her ankles.  She has decent range of motion of her joints.  There is no joint swelling.  Lymphadenopathy:     Cervical: No cervical adenopathy.  Skin:    General: Skin is warm and dry.     Findings: No erythema or rash.  Neurological:     Mental Status: She is alert and oriented to person, place, and time.  Psychiatric:        Behavior: Behavior normal.        Thought Content: Thought content normal.        Judgment: Judgment normal.      Lab Results  Component Value Date   WBC 2.4 (L) 03/17/2019   HGB 9.8 (L) 03/17/2019   HCT 29.4 (L) 03/17/2019   MCV 109.3 (H) 03/17/2019   PLT 83 (L) 03/17/2019   Lab Results  Component Value Date   FERRITIN 85 01/15/2019   IRON 110 01/15/2019   TIBC 353 01/15/2019   UIBC 243 01/15/2019   IRONPCTSAT 31 01/15/2019   Lab Results  Component Value Date   RETICCTPCT 1.2 09/05/2015   RBC 2.69 (L) 03/17/2019   RETICCTABS 40,080  09/05/2015   No results found for: KPAFRELGTCHN, LAMBDASER, KAPLAMBRATIO No results found for: Kandis Cocking, [facility] Lab Results  Component Value Date   TOTALPROTELP 7.1 02/03/2008   ALBUMINELP 54.8 (L) 02/03/2008   A1GS 4.2 02/03/2008   A2GS 9.5 02/03/2008   BETS 8.1 (H) 02/03/2008   BETA2SER 5.8 02/03/2008   GAMS 17.6 02/03/2008   MSPIKE NOT DET 02/03/2008   SPEI * 02/03/2008     Chemistry      Component Value Date/Time   NA 135 03/17/2019 1150   NA 139 01/01/2017 1034   K 3.8 03/17/2019 1150   K 4.1 01/01/2017 1034   CL 105 03/17/2019 1150   CL 107 01/01/2017 1034   CO2 20 (L) 03/17/2019 1150   CO2 29 01/01/2017 1034   BUN 14 03/17/2019 1150   BUN 9 01/01/2017 1034   CREATININE 0.51 03/17/2019 1150   CREATININE 0.71 01/04/2019 1526      Component Value Date/Time   CALCIUM 8.9 03/17/2019 1150   CALCIUM 9.4 01/01/2017 1034   ALKPHOS 119 03/17/2019 1150   ALKPHOS 81 01/01/2017 1034   AST 73 (H) 03/17/2019 1150   ALT 43 03/17/2019 1150   ALT 48 (H) 01/01/2017 1034   BILITOT 2.1 (H) 03/17/2019 1150       Impression and Plan: Ms. 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  She now has a thrombus in the right leg.  This I think is probably a byproduct of the Avastin that she is on and also her having hepatocellular carcinoma.  We will certainly not be able to give her any more Avastin.  This is very complicated.  The fact that she had the bleeding is still has what I suspect is a significant thrombus in the right  leg is going to lead her to have a significant chance of postphlebitic syndrome.  Unfortunately, I think that we are not can be able to anticoagulate her.  She is not a candidate for thrombolytic therapy.  Again, interventional radiology is going above and beyond the "call of duty" and getting the filter put in tomorrow.  It will be interesting to see what endoscopy will show.  Again,  her sister was with her and her sister understands completely what is going on.  I would like to try to get her back to see [place] in another couple weeks just to see how things are going.  Para we spent about 45 minutes with her today.  This was incredibly complicated given and all the phone calls that we had to make and how we had to arrange for procedures to be done.  [name], MD 12/23/20202:01 PM

## 2019-03-18 ENCOUNTER — Ambulatory Visit (HOSPITAL_COMMUNITY)
Admission: RE | Admit: 2019-03-18 | Discharge: 2019-03-18 | Disposition: A | Discharge status: Home / Self Care | Payer: PPO | Source: Ambulatory Visit | Attending: Hematology & Oncology | Admitting: Hematology & Oncology

## 2019-03-18 ENCOUNTER — Encounter (HOSPITAL_COMMUNITY): Payer: Self-pay

## 2019-03-18 DIAGNOSIS — E039 Hypothyroidism, unspecified: Secondary | ICD-10-CM | POA: Diagnosis not present

## 2019-03-18 DIAGNOSIS — Z8505 Personal history of malignant neoplasm of liver: Secondary | ICD-10-CM | POA: Insufficient documentation

## 2019-03-18 DIAGNOSIS — Z79890 Hormone replacement therapy (postmenopausal): Secondary | ICD-10-CM | POA: Insufficient documentation

## 2019-03-18 DIAGNOSIS — Z79899 Other long term (current) drug therapy: Secondary | ICD-10-CM | POA: Insufficient documentation

## 2019-03-18 DIAGNOSIS — F0390 Unspecified dementia without behavioral disturbance: Secondary | ICD-10-CM | POA: Insufficient documentation

## 2019-03-18 DIAGNOSIS — Z7901 Long term (current) use of anticoagulants: Secondary | ICD-10-CM | POA: Insufficient documentation

## 2019-03-18 DIAGNOSIS — I82401 Acute embolism and thrombosis of unspecified deep veins of right lower extremity: Secondary | ICD-10-CM | POA: Diagnosis not present

## 2019-03-18 DIAGNOSIS — Z853 Personal history of malignant neoplasm of breast: Secondary | ICD-10-CM | POA: Insufficient documentation

## 2019-03-18 DIAGNOSIS — Z7984 Long term (current) use of oral hypoglycemic drugs: Secondary | ICD-10-CM | POA: Diagnosis not present

## 2019-03-18 DIAGNOSIS — E119 Type 2 diabetes mellitus without complications: Secondary | ICD-10-CM | POA: Insufficient documentation

## 2019-03-18 HISTORY — PX: IR IVC FILTER PLMT / S&I /IMG GUID/MOD SED: IMG701

## 2019-03-18 LAB — CBC WITH DIFFERENTIAL/PLATELET
Abs Immature Granulocytes: 0.02 10*3/uL (ref 0.00–0.07)
Basophils Absolute: 0 10*3/uL (ref 0.0–0.1)
Basophils Relative: 1 %
Eosinophils Absolute: 0.1 10*3/uL (ref 0.0–0.5)
Eosinophils Relative: 3 %
HCT: 33.5 % — ABNORMAL LOW (ref 36.0–46.0)
Hemoglobin: 11 g/dL — ABNORMAL LOW (ref 12.0–15.0)
Immature Granulocytes: 1 %
Lymphocytes Relative: 17 %
Lymphs Abs: 0.5 10*3/uL — ABNORMAL LOW (ref 0.7–4.0)
MCH: 36.7 pg — ABNORMAL HIGH (ref 26.0–34.0)
MCHC: 32.8 g/dL (ref 30.0–36.0)
MCV: 111.7 fL — ABNORMAL HIGH (ref 80.0–100.0)
Monocytes Absolute: 0.3 10*3/uL (ref 0.1–1.0)
Monocytes Relative: 12 %
Neutro Abs: 1.9 10*3/uL (ref 1.7–7.7)
Neutrophils Relative %: 66 %
Platelets: 95 10*3/uL — ABNORMAL LOW (ref 150–400)
RBC: 3 MIL/uL — ABNORMAL LOW (ref 3.87–5.11)
RDW: 16.4 % — ABNORMAL HIGH (ref 11.5–15.5)
WBC: 2.8 10*3/uL — ABNORMAL LOW (ref 4.0–10.5)
nRBC: 0 % (ref 0.0–0.2)

## 2019-03-18 LAB — HEMOGLOBIN A1C
Hgb A1c MFr Bld: 5.2 % (ref 4.8–5.6)
Mean Plasma Glucose: 103 mg/dL

## 2019-03-18 LAB — BASIC METABOLIC PANEL
Anion gap: 13 (ref 5–15)
BUN: 12 mg/dL (ref 8–23)
CO2: 19 mmol/L — ABNORMAL LOW (ref 22–32)
Calcium: 8.9 mg/dL (ref 8.9–10.3)
Chloride: 105 mmol/L (ref 98–111)
Creatinine, Ser: 0.54 mg/dL (ref 0.44–1.00)
GFR calc Af Amer: >60 mL/min (ref >60)
GFR calc non Af Amer: >60 mL/min (ref >60)
Glucose, Bld: 142 mg/dL — ABNORMAL HIGH (ref 70–99)
Potassium: 3.9 mmol/L (ref 3.5–5.1)
Sodium: 137 mmol/L (ref 135–145)

## 2019-03-18 LAB — AFP TUMOR MARKER: AFP, Serum, Tumor Marker: 176 ng/mL — ABNORMAL HIGH (ref 0.0–8.3)

## 2019-03-18 LAB — PROTIME-INR
INR: 2.2 — ABNORMAL HIGH (ref 0.8–1.2)
Prothrombin Time: 24 seconds — ABNORMAL HIGH (ref 11.4–15.2)

## 2019-03-18 LAB — GLUCOSE, CAPILLARY: Glucose-Capillary: 126 mg/dL — ABNORMAL HIGH (ref 70–99)

## 2019-03-18 LAB — TSH: TSH: 1.195 u[IU]/mL (ref 0.308–3.960)

## 2019-03-18 LAB — IRON AND TIBC
Iron: 72 ug/dL (ref 41–142)
Saturation Ratios: 23 % (ref 21–57)
TIBC: 311 ug/dL (ref 236–444)
UIBC: 239 ug/dL (ref 120–384)

## 2019-03-18 LAB — FERRITIN: Ferritin: 84 ng/mL (ref 11–307)

## 2019-03-18 LAB — T4: T4, Total: 10 ug/dL (ref 4.5–12.0)

## 2019-03-18 MED ORDER — SODIUM CHLORIDE 0.9 % IV SOLN: INTRAVENOUS | Status: DC

## 2019-03-18 MED ORDER — IOHEXOL 300 MG/ML  SOLN
50.0000 mL | Freq: Once | INTRAMUSCULAR | Status: AC | PRN
  Administered 2019-03-18: 10:00:00 15 mL via INTRAVENOUS

## 2019-03-18 MED ORDER — IOHEXOL 300 MG/ML  SOLN
50.0000 mL | Freq: Once | INTRAMUSCULAR | Status: AC | PRN
  Administered 2019-03-18: 24 mL via INTRAVENOUS

## 2019-03-18 MED ORDER — FENTANYL CITRATE (PF) 100 MCG/2ML IJ SOLN
INTRAMUSCULAR | Status: AC | PRN
  Administered 2019-03-18: 50 ug via INTRAVENOUS

## 2019-03-18 MED ORDER — MIDAZOLAM HCL 2 MG/2ML IJ SOLN
INTRAMUSCULAR | Status: AC | PRN
  Administered 2019-03-18: 0.5 mg via INTRAVENOUS
  Administered 2019-03-18: 1 mg via INTRAVENOUS
  Administered 2019-03-18: 0.5 mg via INTRAVENOUS

## 2019-03-18 MED ORDER — LIDOCAINE HCL (PF) 1 % IJ SOLN
INTRAMUSCULAR | Status: AC | PRN
  Administered 2019-03-18: 10 mL

## 2019-03-18 MED ORDER — LIDOCAINE HCL 1 % IJ SOLN
INTRAMUSCULAR | Status: AC
  Filled 2019-03-18: qty 20

## 2019-03-18 MED ORDER — MIDAZOLAM HCL 2 MG/2ML IJ SOLN
INTRAMUSCULAR | Status: AC
  Filled 2019-03-18: qty 2

## 2019-03-18 MED ORDER — FENTANYL CITRATE (PF) 100 MCG/2ML IJ SOLN
INTRAMUSCULAR | Status: AC
  Filled 2019-03-18: qty 2

## 2019-03-18 NOTE — Procedures (Signed)
Interventional Radiology Procedure Note  Procedure: IVC filter placement  Complications: None  Estimated Blood Loss: < 10 mL  Findings: IVC widely patent. [place] IVC filter placed in infrarenal IVC.  Venetia Night. Kathlene Cote, M.D Pager:  [phone]

## 2019-03-18 NOTE — Progress Notes (Signed)
Spoke to patients husband, Jone Panebianco.  He is patients driver today.  Updated him on estimated d/c time and informed him we would call him when patient is back from procedure.  He voiced understanding.

## 2019-03-18 NOTE — Discharge Instructions (Addendum)
Inferior Vena Cava Filter Insertion, Care After This sheet gives you information about how to care for yourself after your procedure. Your health care provider may also give you more specific instructions. If you have problems or questions, contact your health care provider. What can I expect after the procedure? After your procedure, it is common to have:  Mild pain in the area where the filter was inserted.  Mild bruising in the area where the filter was inserted. Follow these instructions at home: Insertion site care   Follow instructions from your health care provider about how to take care of the site where a catheter was inserted at your neck or groin (insertion site). Make sure you: ? Wash your hands with soap and water before you change your bandage (dressing). If soap and water are not available, use hand sanitizer. ? Change your dressing as told by your health care provider.  Check your insertion site every day for signs of infection. Check for: ? More redness, swelling, or pain. ? More fluid or blood. ? Warmth. ? Pus or a bad smell.  Keep the insertion site clean and dry.  Do not shower, bathe, use a hot tub, or let the dressing get wet until your health care provider approves. General instructions  Take over-the-counter and prescription medicines only as told by your health care provider.  Avoid heavy lifting or hard activities for 48 hours after the procedure or as told by your health care provider.  Do not drive for 24 hours if you were given a medicine to help you relax (sedative).  Do not drive or use heavy machinery while taking prescription pain medicine.  Do not go back to school or work until your health care provider approves.  Keep all follow-up visits as told by your health care provider. This is important. Contact a health care provider if:  You have more redness, swelling, or pain around your insertion site.  You have more fluid or blood coming from  your insertion site.  Your insertion site feels warm to the touch.  You have pus or a bad smell coming from your insertion site.  You have a fever.  You are dizzy.  You have nausea and vomiting.  You develop a rash. Get help right away if:  You develop chest pain, a cough, or difficulty breathing.  You develop shortness of breath, feel faint, or pass out.  You cough up blood.  You have severe pain in your abdomen.  You develop swelling and discoloration or pain in your legs.  Your legs become pale and cold or blue.  You develop weakness, difficulty moving your arms or legs, or balance problems.  You develop problems with speech or vision. These symptoms may represent a serious problem that is an emergency. Do not wait to see if the symptoms will go away. Get medical help right away. Call your local emergency services (911 in the [place] Do not drive yourself to the hospital. Summary  After your insertion procedure, it is common to have mild pain and bruising.  Do not shower, bathe, use a hot tub, or let the dressing get wet until your health care provider approves.  Every day, check for signs of infection where a catheter was inserted at your neck or groin (insertion site). This information is not intended to replace advice given to you by your health care provider. Make sure you discuss any questions you have with your health care provider. Document Released: 12/30/2012 Document Revised:  02/21/2017 Document Reviewed: 01/31/2016 Elsevier Patient Education  [place]. Moderate Conscious Sedation, Adult, Care After These instructions provide you with information about caring for yourself after your procedure. Your health care provider may also give you more specific instructions. Your treatment has been planned according to current medical practices, but problems sometimes occur. Call your health care provider if you have any problems or questions after your  procedure. What can I expect after the procedure? After your procedure, it is common:  To feel sleepy for several hours.  To feel clumsy and have poor balance for several hours.  To have poor judgment for several hours.  To vomit if you eat too soon. Follow these instructions at home: For at least 24 hours after the procedure:   Do not: ? Participate in activities where you could fall or become injured. ? Drive. ? Use heavy machinery. ? Drink alcohol. ? Take sleeping pills or medicines that cause drowsiness. ? Make important decisions or sign legal documents. ? Take care of children on your own.  Rest. Eating and drinking  Follow the diet recommended by your health care provider.  If you vomit: ? Drink water, juice, or soup when you can drink without vomiting. ? Make sure you have little or no nausea before eating solid foods. General instructions  Have a responsible adult stay with you until you are awake and alert.  Take over-the-counter and prescription medicines only as told by your health care provider.  If you smoke, do not smoke without supervision.  Keep all follow-up visits as told by your health care provider. This is important. Contact a health care provider if:  You keep feeling nauseous or you keep vomiting.  You feel light-headed.  You develop a rash.  You have a fever. Get help right away if:  You have trouble breathing. This information is not intended to replace advice given to you by your health care provider. Make sure you discuss any questions you have with your health care provider. Document Released: 12/30/2012 Document Revised: 02/21/2017 Document Reviewed: 07/01/2015 Elsevier Patient Education  2020 [employer].

## 2019-03-18 NOTE — H&P (Signed)
Referring Physician(s): Ennever,Peter R  Supervising Physician: Aletta Edouard  Patient Status:  [facility]  Chief Complaint: "I'm getting a filter"   Subjective: Patient familiar to IR service from bland embolization of a right hepatocellular carcinoma on 05/01/2016, microwave ablation of the right hepatocellular carcinoma in March 2018 and microwave ablation of a recurrence of the [facility] in 2019.  She also had a right chest wall Port-A-Cath placed on 09/28/2018 and right thoracentesis on 02/09/19.  She has a history of NASH cirrhosis, remote right breast cancer in 2008 and now with progressive hepatocellular carcinoma.  She was recently found to have a right lower extremity DVT on 03/11/2019, placed on Xarelto with subsequent development of melena/epistaxis and some bleeding from her gums.  Request now received from oncology for IVC filter placement.  She currently denies fever, headache, chest pain, worsening dyspnea, nausea, vomiting.  She does have occasional cough, some intermittent abdominal and back discomfort.  Additional medical history as below.  Past Medical History:  Diagnosis Date  . Breast cancer ([place]) 05/2006   Right breast cancer  . Cirrhosis ([place])   . Dementia ([place])   . Diabetes mellitus without complication ([place])    type 2  . Fibroids    PT IS UNAWARE   . Goals of care, counseling/discussion 09/17/2018  . Hepatocellular carcinoma ([place]) 05/08/2016  . Hypothyroidism   . Osteopenia   . Personal history of radiation therapy 2008  . Thrombocytopenia ([place])   . Thyroid disease    Past Surgical History:  Procedure Laterality Date  . ABDOMINAL HYSTERECTOMY     TAH BSO  . BREAST LUMPECTOMY Right 2008  . BREAST LUMPECTOMY WITH AXILLARY LYMPH NODE BIOPSY Right 06/25/2006  . CHOLECYSTECTOMY    . IR GENERIC HISTORICAL  03/28/2016   IR RADIOLOGIST EVAL & MGMT 03/28/2016 Corrie Mckusick, DO [facility] INTERV RAD  . IR GENERIC HISTORICAL  05/01/2016   IR ANGIOGRAM VISCERAL SELECTIVE 05/01/2016  Corrie Mckusick, DO [facility]  . IR GENERIC HISTORICAL  05/01/2016   IR ANGIOGRAM VISCERAL SELECTIVE 05/01/2016 Corrie Mckusick, DO [facility]  . IR GENERIC HISTORICAL  05/01/2016   IR ANGIOGRAM SELECTIVE EACH ADDITIONAL VESSEL 05/01/2016 Corrie Mckusick, DO [facility]  . IR GENERIC HISTORICAL  05/01/2016   IR ANGIOGRAM SELECTIVE EACH ADDITIONAL VESSEL 05/01/2016 Corrie Mckusick, DO [facility]  . IR GENERIC HISTORICAL  05/01/2016   IR ANGIOGRAM SELECTIVE EACH ADDITIONAL VESSEL 05/01/2016 Corrie Mckusick, DO [facility]  . IR GENERIC HISTORICAL  05/01/2016   IR EMBO TUMOR ORGAN ISCHEMIA INFARCT INC GUIDE ROADMAPPING 05/01/2016 Corrie Mckusick, DO [facility]  . IR GENERIC HISTORICAL  05/01/2016   IR CT SPINE LTD 05/01/2016 Corrie Mckusick, DO [facility]  . IR GENERIC HISTORICAL  05/01/2016   IR US GUIDE VASC ACCESS RIGHT 05/01/2016 Corrie Mckusick, DO [facility]  . IR GENERIC HISTORICAL  05/01/2016   IR ANGIOGRAM SELECTIVE EACH ADDITIONAL VESSEL 05/01/2016 Corrie Mckusick, DO [facility]  . IR IMAGING GUIDED PORT INSERTION  09/28/2018  . IR RADIOLOGIST EVAL & MGMT  07/31/2016  . IR RADIOLOGIST EVAL & MGMT  12/25/2016  . IR RADIOLOGIST EVAL & MGMT  12/10/2017  . IR RADIOLOGIST EVAL & MGMT  04/21/2018  . IR RADIOLOGIST EVAL & MGMT  08/14/2018  . RADIOFREQUENCY ABLATION N/A 06/14/2016   Procedure: LIVER MICROWAVE THERMAL  ABLATION;  Surgeon: Corrie Mckusick, DO;  Location: [facility];  Service: Anesthesiology;  Laterality: N/A;  . RADIOLOGY WITH ANESTHESIA Right 01/21/2018   Procedure: CT  WITH ANESTHESIA/MICROWAVE THERMAL ABLATION OF LIVER;  Surgeon: Corrie Mckusick, DO;  Location: [facility];  Service: Radiology;  Laterality: Right;  . TUBAL LIGATION  1978       Allergies: Metformin and related and Welchol [colesevelam hcl]  Medications: Prior to Admission medications   Medication Sig Start Date End Date Taking? Authorizing Provider  Cholecalciferol (VITAMIN D3) 5000 units CAPS Take 5,000 Units by mouth daily.   Yes [provider]  cyanocobalamin 1000 MCG tablet Take 1,000 mcg by mouth daily.   Yes [provider]  levothyroxine (SYNTHROID) 112 MCG tablet Take 1 tablet daily on an empty stomach with only water for 30 minutes & no Antacid meds, Calcium or Magnesium for 4 hours & avoid Biotin 12/02/18  Yes Unk Pinto, MD  magnesium oxide (MAG-OX) 400 MG tablet Take 400 mg by mouth daily.    Yes [provider]  metFORMIN (GLUCOPHAGE) 500 MG tablet Take 1 tablet with Breakfast & Lunch and 2 tablets with supper for Diabetes 09/10/18  Yes Unk Pinto, MD  rifaximin (XIFAXAN) 550 MG TABS tablet Take 550 mg by mouth 2 (two) times daily.   Yes [provider]  sitaGLIPtin (JANUVIA) 100 MG tablet Take 100 mg by mouth daily.   Yes [provider]  glucose blood (ONETOUCH ULTRA) test strip Check blood Sugar 3 x  /day before Meals 01/09/19   Unk Pinto, MD  lactulose (CHRONULAC) 10 GM/15ML solution Take 30 mLs (20 g total) by mouth 3 (three) times daily. Patient not taking: Reported on 03/17/2019 04/03/16   Rolene Course, PA-C  Lancets [facility] DELICA PLUS [id number]) MISC 300 Wafers by Does not apply route 3 (three) times daily before meals. Check blood sugar 3 x  /day 01/09/19   Unk Pinto, MD  lidocaine-prilocaine (EMLA) cream Apply to affected area once Patient not taking: Reported on 03/17/2019 09/29/18   [name], MD  meclizine (ANTIVERT) 25 MG tablet 1/2-1 pill up to 3 times daily as needed dizziness. Patient not taking: Reported on 03/17/2019 02/24/19   Liane Comber, NP  ondansetron (ZOFRAN) 8 MG tablet Take 1 tablet (8 mg total) by mouth 2 (two) times daily as needed (Nausea or vomiting). Patient not taking: Reported on 03/17/2019 09/29/18   [name], MD  prochlorperazine (COMPAZINE) 10 MG tablet Take 1 tablet (10 mg total) by mouth every 6 (six) hours as needed (Nausea or vomiting). Patient not taking: Reported on 03/17/2019 09/29/18   [name], MD     Vital Signs: BP 122/86   Pulse (!) 102   Temp 98.2 F (36.8 C) (Oral)   Resp 16   SpO2 98%   Physical Exam: awake, alert.  Chest with diminished breath sounds right base, left clear.  Clean, intact right chest wall Port-A-Cath.  Heart with regular rate and rhythm.  Abdomen soft, positive bowel sounds, currently nontender, mild distention.  Significant edema right lower extremity, mild left lower extremity edema.  Imaging: No results found.  Labs:  CBC: Recent Labs    02/05/19 1212 02/25/19 1115 03/15/19 1601 03/17/19 1150  WBC 2.8* 2.5* 2.2* 2.4*  HGB 10.8* 10.7* 10.9* 9.8*  HCT 32.6* 32.1* 32.7* 29.4*  PLT 77* 88* 87* 83*    COAGS: Recent Labs    07/29/18 1116 09/28/18 1100 03/15/19 1601  INR 1.2* 1.2 4.0*    BMP: Recent Labs    02/05/19 1212 02/25/19 1115 03/15/19 1601 03/17/19 1150  NA 137 137 136 135  K 4.1  3.8 3.5 3.8  CL 108 105 106 105  CO2 18* 21* 21* 20*  GLUCOSE 154* 189* 290* 220*  BUN 18 11 12 14   CALCIUM 9.0 8.9 9.1 8.9  CREATININE 0.61 0.65 0.62 0.51  GFRNONAA >60 >60 >60 >60  GFRAA >60 >60 >60 >60    LIVER FUNCTION TESTS: Recent Labs    02/05/19 1212 02/25/19 1115 03/15/19 1601 03/17/19 1150  BILITOT 2.3* 2.3* 2.2* 2.1*  AST 81* 73* 80* 73*  ALT 54* 47* 48* 43  ALKPHOS 165* 146* 135* 119  PROT 5.8* 6.0* 5.7* 5.6*  ALBUMIN 3.0* 2.9* 2.5* 2.7*    Assessment and Plan: Pt with history of NASH cirrhosis, remote right breast cancer in 2008 and now with progressive hepatocellular carcinoma.  She is status post prior bland embolization as well as microwave ablation x2 of the hepatocellular carcinoma in 2018/2019.  She was recently found to have a right lower extremity DVT on 03/11/2019, placed on Xarelto with subsequent development of melena/epistaxis and some bleeding from her gums.  Request now received from oncology for IVC filter placement. Risks and benefits discussed with the patient/spouse including, but not  limited to bleeding, infection, contrast induced renal failure, filter fracture or migration which can lead to emergency surgery or even death, strut penetration with damage or irritation to adjacent structures and caval thrombosis.  All of the patient's questions were answered, patient is agreeable to proceed. Consent signed and in chart.      Electronically Signed: D. Rowe Robert, PA-C 03/18/2019, 8:25 AM   I spent a total of 25 minutes at the the patient's bedside AND on the patient's hospital floor or unit, greater than 50% of which was counseling/coordinating care for inferior vena cava filter placement

## 2019-03-22 DIAGNOSIS — K7290 Hepatic failure, unspecified without coma: Secondary | ICD-10-CM | POA: Diagnosis not present

## 2019-03-22 DIAGNOSIS — K7460 Unspecified cirrhosis of liver: Secondary | ICD-10-CM | POA: Diagnosis not present

## 2019-03-22 DIAGNOSIS — R195 Other fecal abnormalities: Secondary | ICD-10-CM | POA: Diagnosis not present

## 2019-03-24 ENCOUNTER — Encounter (HOSPITAL_BASED_OUTPATIENT_CLINIC_OR_DEPARTMENT_OTHER): Payer: Self-pay

## 2019-03-24 ENCOUNTER — Telehealth: Payer: Self-pay

## 2019-03-24 ENCOUNTER — Inpatient Hospital Stay: Payer: PPO

## 2019-03-24 ENCOUNTER — Ambulatory Visit (HOSPITAL_BASED_OUTPATIENT_CLINIC_OR_DEPARTMENT_OTHER)
Admission: RE | Admit: 2019-03-24 | Discharge: 2019-03-24 | Disposition: A | Discharge status: Home / Self Care | Payer: PPO | Source: Ambulatory Visit | Attending: Family | Admitting: Family

## 2019-03-24 ENCOUNTER — Inpatient Hospital Stay (HOSPITAL_BASED_OUTPATIENT_CLINIC_OR_DEPARTMENT_OTHER): Payer: PPO | Admitting: Hematology & Oncology

## 2019-03-24 ENCOUNTER — Other Ambulatory Visit: Payer: Self-pay

## 2019-03-24 VITALS — BP 126/69 | HR 90 | Temp 97.3°F | Resp 18

## 2019-03-24 DIAGNOSIS — Z8505 Personal history of malignant neoplasm of liver: Secondary | ICD-10-CM

## 2019-03-24 DIAGNOSIS — K7581 Nonalcoholic steatohepatitis (NASH): Secondary | ICD-10-CM

## 2019-03-24 DIAGNOSIS — C220 Liver cell carcinoma: Secondary | ICD-10-CM

## 2019-03-24 DIAGNOSIS — E785 Hyperlipidemia, unspecified: Secondary | ICD-10-CM

## 2019-03-24 DIAGNOSIS — Z8249 Family history of ischemic heart disease and other diseases of the circulatory system: Secondary | ICD-10-CM | POA: Diagnosis not present

## 2019-03-24 DIAGNOSIS — Z5112 Encounter for antineoplastic immunotherapy: Secondary | ICD-10-CM | POA: Diagnosis not present

## 2019-03-24 DIAGNOSIS — K7460 Unspecified cirrhosis of liver: Secondary | ICD-10-CM

## 2019-03-24 DIAGNOSIS — E1122 Type 2 diabetes mellitus with diabetic chronic kidney disease: Secondary | ICD-10-CM

## 2019-03-24 DIAGNOSIS — I82401 Acute embolism and thrombosis of unspecified deep veins of right lower extremity: Secondary | ICD-10-CM

## 2019-03-24 DIAGNOSIS — E1169 Type 2 diabetes mellitus with other specified complication: Secondary | ICD-10-CM

## 2019-03-24 LAB — CBC WITH DIFFERENTIAL (CANCER CENTER ONLY)
Abs Immature Granulocytes: 0.01 10*3/uL (ref 0.00–0.07)
Basophils Absolute: 0 10*3/uL (ref 0.0–0.1)
Basophils Relative: 1 %
Eosinophils Absolute: 0 10*3/uL (ref 0.0–0.5)
Eosinophils Relative: 1 %
HCT: 30.9 % — ABNORMAL LOW (ref 36.0–46.0)
Hemoglobin: 10.2 g/dL — ABNORMAL LOW (ref 12.0–15.0)
Immature Granulocytes: 0 %
Lymphocytes Relative: 16 %
Lymphs Abs: 0.5 10*3/uL — ABNORMAL LOW (ref 0.7–4.0)
MCH: 35.8 pg — ABNORMAL HIGH (ref 26.0–34.0)
MCHC: 33 g/dL (ref 30.0–36.0)
MCV: 108.4 fL — ABNORMAL HIGH (ref 80.0–100.0)
Monocytes Absolute: 0.4 10*3/uL (ref 0.1–1.0)
Monocytes Relative: 13 %
Neutro Abs: 2 10*3/uL (ref 1.7–7.7)
Neutrophils Relative %: 69 %
Platelet Count: 82 10*3/uL — ABNORMAL LOW (ref 150–400)
RBC: 2.85 MIL/uL — ABNORMAL LOW (ref 3.87–5.11)
RDW: 15.9 % — ABNORMAL HIGH (ref 11.5–15.5)
WBC Count: 2.9 10*3/uL — ABNORMAL LOW (ref 4.0–10.5)
nRBC: 0 % (ref 0.0–0.2)

## 2019-03-24 LAB — CMP (CANCER CENTER ONLY)
ALT: 46 U/L — ABNORMAL HIGH (ref 0–44)
AST: 75 U/L — ABNORMAL HIGH (ref 15–41)
Albumin: 2.8 g/dL — ABNORMAL LOW (ref 3.5–5.0)
Alkaline Phosphatase: 148 U/L — ABNORMAL HIGH (ref 38–126)
Anion gap: 10 (ref 5–15)
BUN: 12 mg/dL (ref 8–23)
CO2: 22 mmol/L (ref 22–32)
Calcium: 9.3 mg/dL (ref 8.9–10.3)
Chloride: 104 mmol/L (ref 98–111)
Creatinine: 0.48 mg/dL (ref 0.44–1.00)
GFR, Est AFR Am: >60 mL/min (ref >60)
GFR, Estimated: >60 mL/min (ref >60)
Glucose, Bld: 137 mg/dL — ABNORMAL HIGH (ref 70–99)
Potassium: 4 mmol/L (ref 3.5–5.1)
Sodium: 136 mmol/L (ref 135–145)
Total Bilirubin: 2.9 mg/dL — ABNORMAL HIGH (ref 0.3–1.2)
Total Protein: 5.9 g/dL — ABNORMAL LOW (ref 6.5–8.1)

## 2019-03-24 LAB — HEMOGLOBIN A1C
Hgb A1c MFr Bld: 4.9 % (ref 4.8–5.6)
Mean Plasma Glucose: 93.93 mg/dL

## 2019-03-24 LAB — LIPID PANEL
Cholesterol: 151 mg/dL (ref 0–200)
HDL: 38 mg/dL — ABNORMAL LOW (ref >40)
LDL Cholesterol: 93 mg/dL (ref 0–99)
Total CHOL/HDL Ratio: 4 RATIO
Triglycerides: 99 mg/dL (ref <150)
VLDL: 20 mg/dL (ref 0–40)

## 2019-03-24 LAB — LACTATE DEHYDROGENASE: LDH: 410 U/L — ABNORMAL HIGH (ref 98–192)

## 2019-03-24 MED ORDER — SODIUM CHLORIDE 0.9 % IV SOLN
Freq: Once | INTRAVENOUS | Status: AC
  Filled 2019-03-24: qty 250

## 2019-03-24 MED ORDER — SODIUM CHLORIDE 0.9 % IV SOLN
1200.0000 mg | Freq: Once | INTRAVENOUS | Status: AC
  Administered 2019-03-24: 1200 mg via INTRAVENOUS
  Filled 2019-03-24: qty 20

## 2019-03-24 MED ORDER — SODIUM CHLORIDE 0.9% FLUSH
10.0000 mL | INTRAVENOUS | Status: DC | PRN
  Administered 2019-03-24: 10 mL
  Filled 2019-03-24: qty 10

## 2019-03-24 MED ORDER — FUROSEMIDE 40 MG PO TABS: 40.0000 mg | ORAL_TABLET | Freq: Every day | ORAL | 4 refills | Status: DC

## 2019-03-24 MED ORDER — HEPARIN SOD (PORK) LOCK FLUSH 100 UNIT/ML IV SOLN
500.0000 [IU] | Freq: Once | INTRAVENOUS | Status: AC | PRN
  Administered 2019-03-24: 500 [IU]
  Filled 2019-03-24: qty 5

## 2019-03-24 MED ORDER — IOHEXOL 300 MG/ML  SOLN
100.0000 mL | Freq: Once | INTRAMUSCULAR | Status: AC | PRN
  Administered 2019-03-24: 100 mL via INTRAVENOUS

## 2019-03-24 NOTE — Telephone Encounter (Signed)
Patient completed form for [employer] patient assistance placed in box of Baxter Flattery, Development worker, community. dph

## 2019-03-24 NOTE — Progress Notes (Signed)
OK to treat with today's platelets value per Dr. Marin Olp.

## 2019-03-24 NOTE — Progress Notes (Signed)
Hematology and Oncology Follow Up Visit  Beverly Davis [id number] 05-25-1940 78 y.o. 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  Since he was last here, she has had problems.  She unfortunately developed a thrombus in the right leg.  We tried her on anticoagulation and she began to bleed.  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  She has seen her gastroenterologist.  He is going to hold on any endoscopic procedure for right now.  We did get a filter put into her.  She still has a swelling in her legs.  She is not on any type of diuretic.  We will try her on some Lasix to see if this might help take down some of the swelling.  I had given her a prescription for a fitted compression stocking for her right leg.  She has not yet gotten this done.  She did have a CT scan done today.  Thankfully, there is no evidence of disease progression.  She has cirrhosis.  She has splenomegaly.  She has a stable enlarged portacaval node.  She has increasing ascites.  She has increased in another right pleural effusion.  The actual liver primary is partially calcified and measures 4.4 x 4.1 x 5.8 cm.  She will need another thoracentesis in another paracentesis.  I really think that her issues are going come down to her cirrhosis worsening before her cancer progresses.  Of note, we cannot use Avastin given that she has thromboembolic disease.  She has her dementia.   Her son comes in with her which is always nice to have.  He is always on top of things.  He is a really good support system for her.  She is as nice is can be.  She is very worried about the cost of everything that she does.  As such, we have to factor cost into any decisions that we recommend.  Her appetite seems to be okay.  She is having no problems with nausea or vomiting.  There is no cough.  There is no shortness of breath.  There is no chest wall pain.  Overall, her performance status is ECOG 1-2.   Medications:  Allergies as of 03/24/2019      Reactions   Metformin And Related Other (See Comments)   Unknown   Welchol [colesevelam Hcl] Other (See Comments)   Unknown      Medication List       Accurate as of March 24, 2019  1:36 PM. If you have any questions, ask your nurse or doctor.        cyanocobalamin 1000 MCG tablet Take 1,000 mcg by mouth daily.   Januvia 100 MG tablet Generic drug: sitaGLIPtin Take 100 mg by mouth daily.   lactulose 10 GM/15ML solution Commonly known as: CHRONULAC Take 30 mLs (20 g total) by mouth 3 (three) times daily.   levothyroxine 112 MCG tablet Commonly known as: Synthroid Take 1 tablet daily on an empty stomach with only water for 30  minutes & no Antacid meds, Calcium or Magnesium for 4 hours & avoid Biotin   lidocaine-prilocaine cream Commonly known as: EMLA Apply to affected area once   magnesium oxide 400 MG tablet Commonly known as: MAG-OX Take 400 mg by mouth daily.   meclizine 25 MG tablet Commonly known as: ANTIVERT 1/2-1 pill up to 3 times daily as needed dizziness.   metFORMIN 500 MG tablet Commonly known as: Glucophage Take 1 tablet with Breakfast & Lunch and 2 tablets with supper for Diabetes   ondansetron 8 MG tablet Commonly known as: Zofran Take 1 tablet (8 mg total) by mouth 2 (two) times daily as needed (Nausea or vomiting).   OneTouch Delica Plus [id number] Misc 300 Wafers by Does not apply route 3  (three) times daily before meals. Check blood sugar 3 x  /day   OneTouch Ultra test strip Generic drug: glucose blood Check blood Sugar 3 x  /day before Meals   prochlorperazine 10 MG tablet Commonly known as: COMPAZINE Take 1 tablet (10 mg total) by mouth every 6 (six) hours as needed (Nausea or vomiting).   rifaximin 550 MG Tabs tablet Commonly known as: XIFAXAN Take 550 mg by mouth 2 (two) times daily.   Vitamin D3 125 MCG (5000 UT) Caps Take 5,000 Units by mouth daily.       Allergies:  Allergies  Allergen Reactions  . Metformin And Related Other (See Comments)    Unknown  . Welchol [Colesevelam Hcl] Other (See Comments)    Unknown    Past Medical History, Surgical history, Social history, and Family History were reviewed and updated.  Review of Systems: Review of Systems  Constitutional: Positive for malaise/fatigue.  HENT: Negative.   Eyes: Negative.   Respiratory: Positive for shortness of breath.   Cardiovascular: Positive for leg swelling.  Gastrointestinal: Positive for constipation.  Genitourinary: Negative.   Musculoskeletal: Positive for back pain.  Skin: Negative.   Neurological: Negative.   Endo/Heme/Allergies: Negative.   Psychiatric/Behavioral: Positive for memory loss.     Physical Exam:  temporal temperature is 97.3 F (36.3 C) (abnormal). Her blood pressure is 126/69 and her pulse is 90. Her respiration is 18 and oxygen saturation is 100%.   Wt Readings from Last 3 Encounters:  03/17/19 138 lb 12.8 oz (63 kg)  02/25/19 139 lb 1.9 oz (63.1 kg)  02/25/19 139 lb 1.9 oz (63.1 kg)    Physical Exam Vitals reviewed.  HENT:     Head: Normocephalic and atraumatic.  Eyes:     Pupils: Pupils are equal, round, and reactive to light.  Cardiovascular:     Rate and Rhythm: Normal rate and regular rhythm.     Heart sounds: Normal heart sounds.     Comments: Cardiac exam regular rate and rhythm with a normal S1 and S2.  She has a 1/6 systolic  ejection murmur. Pulmonary:     Effort: Pulmonary effort is normal.     Breath sounds: Normal breath sounds.     Comments: Pulmonary exam shows decreased breath sounds on the right side.  She has decent breath sounds on the left side.  No wheezing is noted. Abdominal:     General: Bowel sounds are normal.     Palpations: Abdomen is soft.     Comments: Abdominal exam shows a somewhat distended abdomen.  She has what appears to be ascites.  There is no guarding.  She has no tenderness.  There is no obvious hepatomegaly or splenomegaly.  Musculoskeletal:  General: No tenderness or deformity. Normal range of motion.     Cervical back: Normal range of motion.     Comments: Extremities shows some mild edema in her ankles.  She has decent range of motion of her joints.  There is no joint swelling.  Lymphadenopathy:     Cervical: No cervical adenopathy.  Skin:    General: Skin is warm and dry.     Findings: No erythema or rash.  Neurological:     Mental Status: She is alert and oriented to person, place, and time.  Psychiatric:        Behavior: Behavior normal.        Thought Content: Thought content normal.        Judgment: Judgment normal.      Lab Results  Component Value Date   WBC 2.9 (L) 03/24/2019   HGB 10.2 (L) 03/24/2019   HCT 30.9 (L) 03/24/2019   MCV 108.4 (H) 03/24/2019   PLT 82 (L) 03/24/2019   Lab Results  Component Value Date   FERRITIN 84 03/17/2019   IRON 72 03/17/2019   TIBC 311 03/17/2019   UIBC 239 03/17/2019   IRONPCTSAT 23 03/17/2019   Lab Results  Component Value Date   RETICCTPCT 1.2 09/05/2015   RBC 2.85 (L) 03/24/2019   RETICCTABS 40,080 09/05/2015   No results found for: KPAFRELGTCHN, LAMBDASER, KAPLAMBRATIO No results found for: Kandis Cocking, [facility] Lab Results  Component Value Date   TOTALPROTELP 7.1 02/03/2008   ALBUMINELP 54.8 (L) 02/03/2008   A1GS 4.2 02/03/2008   A2GS 9.5 02/03/2008   BETS 8.1 (H) 02/03/2008   BETA2SER 5.8  02/03/2008   GAMS 17.6 02/03/2008   MSPIKE NOT DET 02/03/2008   SPEI * 02/03/2008     Chemistry      Component Value Date/Time   NA 136 03/24/2019 1130   NA 139 01/01/2017 1034   K 4.0 03/24/2019 1130   K 4.1 01/01/2017 1034   CL 104 03/24/2019 1130   CL 107 01/01/2017 1034   CO2 22 03/24/2019 1130   CO2 29 01/01/2017 1034   BUN 12 03/24/2019 1130   BUN 9 01/01/2017 1034   CREATININE 0.48 03/24/2019 1130   CREATININE 0.71 01/04/2019 1526      Component Value Date/Time   CALCIUM 9.3 03/24/2019 1130   CALCIUM 9.4 01/01/2017 1034   ALKPHOS 148 (H) 03/24/2019 1130   ALKPHOS 81 01/01/2017 1034   AST 75 (H) 03/24/2019 1130   ALT 46 (H) 03/24/2019 1130   ALT 48 (H) 01/01/2017 1034   BILITOT 2.9 (H) 03/24/2019 1130       Impression and Plan: Beverly Davis is a very pleasant 78yo caucasian female with 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  I think we will go ahead with her treatment today.  We will just use immunotherapy.  If we find that she does have progressive disease, we can we switch her over to oral therapy.  Of note, her last alpha-fetoprotein was 176.  The CT scan is somewhat reassuring.  We will probably not need another CAT scan for another 3 months.  We will set her up with a thoracentesis and a paracentesis.  As always, we spent about 45 minutes with her today.    This was incredibly complicated given and all the phone calls that we had to make and how we had to arrange for procedures to be done.  [name], MD 12/30/20201:36 PM

## 2019-03-25 ENCOUNTER — Other Ambulatory Visit: Payer: Self-pay | Admitting: Family

## 2019-03-25 ENCOUNTER — Telehealth: Payer: Self-pay | Admitting: Hematology & Oncology

## 2019-03-25 LAB — AFP TUMOR MARKER: AFP, Serum, Tumor Marker: 196 ng/mL — ABNORMAL HIGH (ref 0.0–8.3)

## 2019-03-25 NOTE — Telephone Encounter (Signed)
No LOS 12/30

## 2019-03-29 ENCOUNTER — Telehealth: Payer: Self-pay | Admitting: *Deleted

## 2019-03-29 NOTE — Telephone Encounter (Signed)
Returned patient's phone calls regarding appointments. Reviewed the next appointments, date and time with her. She verbalized understanding.

## 2019-03-30 ENCOUNTER — Telehealth: Payer: Self-pay | Admitting: *Deleted

## 2019-03-30 NOTE — Telephone Encounter (Signed)
Called son, [name], and informed him that the thoracentesis and paracentesis has been scheduled. He is aware of the dates and times. He verbalized understanding.

## 2019-04-05 ENCOUNTER — Other Ambulatory Visit: Payer: Self-pay | Admitting: Hematology & Oncology

## 2019-04-05 ENCOUNTER — Ambulatory Visit (HOSPITAL_COMMUNITY)
Admission: RE | Admit: 2019-04-05 | Discharge: 2019-04-05 | Disposition: A | Discharge status: Home / Self Care | Payer: PPO | Source: Ambulatory Visit | Attending: Hematology & Oncology | Admitting: Hematology & Oncology

## 2019-04-05 ENCOUNTER — Other Ambulatory Visit (HOSPITAL_COMMUNITY)
Admission: RE | Admit: 2019-04-05 | Discharge: 2019-04-05 | Disposition: A | Discharge status: Home / Self Care | Payer: PPO | Source: Ambulatory Visit | Attending: Hematology & Oncology | Admitting: Hematology & Oncology

## 2019-04-05 DIAGNOSIS — Z01818 Encounter for other preprocedural examination: Secondary | ICD-10-CM | POA: Diagnosis not present

## 2019-04-05 DIAGNOSIS — K7460 Unspecified cirrhosis of liver: Secondary | ICD-10-CM | POA: Insufficient documentation

## 2019-04-05 DIAGNOSIS — Z20822 Contact with and (suspected) exposure to covid-19: Secondary | ICD-10-CM | POA: Insufficient documentation

## 2019-04-05 DIAGNOSIS — R188 Other ascites: Secondary | ICD-10-CM | POA: Diagnosis not present

## 2019-04-05 DIAGNOSIS — K7581 Nonalcoholic steatohepatitis (NASH): Secondary | ICD-10-CM

## 2019-04-05 MED ORDER — LIDOCAINE HCL 1 % IJ SOLN
INTRAMUSCULAR | Status: AC
  Filled 2019-04-05: qty 20

## 2019-04-05 NOTE — Progress Notes (Signed)
Patient with history of [place] who presented today for therapeutic paracentesis due to increased ascites noted on most recent CT.  Beverly Davis stated to me as soon as I walked in that she did not really want to have this procedure, however she was amenable to me looking at her abdomen. She denies abdominal pain or dyspnea - her only complaint is RLE swelling due to known DVT. [place] of all four abdominal quadrants today shows a very small amount of perihepatic fluid which is potentially amenable to percutaneous drainage however with significant increase in risk to the patient. Additionally, most recent labs show plt 88 and INR 2.2 which also increase the risk of bleeding.   After review of imaging with patient she stated again that she did not want to have any procedure today.   No procedure performed, [place] images are available for review under imaging tab on Epic.   Per chart she is planned for right thoracentesis on 1/14 here at [facility] - limited right chest [place] was performed per patient request which shows adequate pleural fluid today. She will keep her appointment for Covid testing today and return to IR on 1/14 for thoracentesis.  Candiss Norse, PA-C

## 2019-04-06 LAB — NOVEL CORONAVIRUS, NAA (HOSP ORDER, SEND-OUT TO REF LAB; TAT 18-24 HRS): SARS-CoV-2, NAA: NOT DETECTED

## 2019-04-08 ENCOUNTER — Other Ambulatory Visit: Payer: Self-pay

## 2019-04-08 ENCOUNTER — Ambulatory Visit (HOSPITAL_COMMUNITY)
Admission: RE | Admit: 2019-04-08 | Discharge: 2019-04-08 | Disposition: A | Discharge status: Home / Self Care | Payer: PPO | Source: Ambulatory Visit | Attending: Student | Admitting: Student

## 2019-04-08 ENCOUNTER — Ambulatory Visit (HOSPITAL_COMMUNITY)
Admission: RE | Admit: 2019-04-08 | Discharge: 2019-04-08 | Disposition: A | Discharge status: Home / Self Care | Payer: PPO | Source: Ambulatory Visit | Attending: Hematology & Oncology | Admitting: Hematology & Oncology

## 2019-04-08 ENCOUNTER — Other Ambulatory Visit: Payer: Self-pay | Admitting: Hematology & Oncology

## 2019-04-08 DIAGNOSIS — J90 Pleural effusion, not elsewhere classified: Secondary | ICD-10-CM | POA: Insufficient documentation

## 2019-04-08 DIAGNOSIS — J948 Other specified pleural conditions: Secondary | ICD-10-CM | POA: Diagnosis not present

## 2019-04-08 DIAGNOSIS — K7581 Nonalcoholic steatohepatitis (NASH): Secondary | ICD-10-CM

## 2019-04-08 DIAGNOSIS — K7460 Unspecified cirrhosis of liver: Secondary | ICD-10-CM

## 2019-04-08 DIAGNOSIS — Z9889 Other specified postprocedural states: Secondary | ICD-10-CM

## 2019-04-08 HISTORY — PX: IR THORACENTESIS ASP PLEURAL SPACE W/IMG GUIDE: IMG5380

## 2019-04-08 LAB — LACTATE DEHYDROGENASE, PLEURAL OR PERITONEAL FLUID: LD, Fluid: 61 U/L — ABNORMAL HIGH (ref 3–23)

## 2019-04-08 LAB — ALBUMIN, PLEURAL OR PERITONEAL FLUID: Albumin, Fluid: <1 g/dL

## 2019-04-08 MED ORDER — LIDOCAINE HCL 1 % IJ SOLN
INTRAMUSCULAR | Status: DC | PRN
  Administered 2019-04-08: 5 mL

## 2019-04-08 NOTE — Procedures (Signed)
PROCEDURE SUMMARY:  Successful image-guided right thoracentesis. Yielded 1.0 liter of clear gold fluid. Patient tolerated procedure well. No immediate complications. EBL = 0 mL.  Specimen was sent for labs. CXR ordered.  Please see imaging section of Epic for full dictation.   Matayah Reyburn PA-C 04/08/2019 1:16 PM

## 2019-04-09 LAB — CYTOLOGY - NON PAP

## 2019-04-12 ENCOUNTER — Telehealth: Payer: Self-pay | Admitting: *Deleted

## 2019-04-12 NOTE — Telephone Encounter (Signed)
As noted below by Dr. Marin Olp, I informed the patient that the fluid that was removed does NOT contain any cancer cells. The fluid is due to cirrhosis of the liver. She verbalized understanding.

## 2019-04-12 NOTE — Telephone Encounter (Signed)
-----   Message from [name], MD sent at 04/12/2019  7:02 AM EST ----- Call - the fluid that was removed does NOT contain any cancer cells!!  The fluid is due to cirrhosis of the liver.  Laurey Arrow

## 2019-04-14 ENCOUNTER — Inpatient Hospital Stay: Payer: PPO | Attending: Hematology & Oncology | Admitting: Family

## 2019-04-14 ENCOUNTER — Other Ambulatory Visit: Payer: Self-pay

## 2019-04-14 ENCOUNTER — Encounter: Payer: Self-pay | Admitting: Family

## 2019-04-14 ENCOUNTER — Inpatient Hospital Stay: Payer: PPO

## 2019-04-14 ENCOUNTER — Telehealth: Payer: Self-pay | Admitting: Hematology & Oncology

## 2019-04-14 ENCOUNTER — Other Ambulatory Visit: Payer: Self-pay | Admitting: *Deleted

## 2019-04-14 VITALS — BP 127/55 | HR 102 | Temp 97.3°F | Resp 20 | Ht 62.0 in | Wt 140.0 lb

## 2019-04-14 DIAGNOSIS — Z95828 Presence of other vascular implants and grafts: Secondary | ICD-10-CM

## 2019-04-14 DIAGNOSIS — M7989 Other specified soft tissue disorders: Secondary | ICD-10-CM

## 2019-04-14 DIAGNOSIS — C220 Liver cell carcinoma: Secondary | ICD-10-CM | POA: Insufficient documentation

## 2019-04-14 DIAGNOSIS — N3941 Urge incontinence: Secondary | ICD-10-CM | POA: Diagnosis not present

## 2019-04-14 DIAGNOSIS — R399 Unspecified symptoms and signs involving the genitourinary system: Secondary | ICD-10-CM

## 2019-04-14 DIAGNOSIS — K7581 Nonalcoholic steatohepatitis (NASH): Secondary | ICD-10-CM | POA: Diagnosis not present

## 2019-04-14 DIAGNOSIS — Z170 Estrogen receptor positive status [ER+]: Secondary | ICD-10-CM | POA: Diagnosis not present

## 2019-04-14 DIAGNOSIS — K7460 Unspecified cirrhosis of liver: Secondary | ICD-10-CM

## 2019-04-14 DIAGNOSIS — Z8505 Personal history of malignant neoplasm of liver: Secondary | ICD-10-CM

## 2019-04-14 DIAGNOSIS — Z5112 Encounter for antineoplastic immunotherapy: Secondary | ICD-10-CM | POA: Diagnosis not present

## 2019-04-14 DIAGNOSIS — Z79899 Other long term (current) drug therapy: Secondary | ICD-10-CM | POA: Diagnosis not present

## 2019-04-14 LAB — URINALYSIS, COMPLETE (UACMP) WITH MICROSCOPIC
Bilirubin Urine: NEGATIVE
Glucose, UA: >=500 mg/dL — AB
Ketones, ur: NEGATIVE mg/dL
Leukocytes,Ua: NEGATIVE
Nitrite: NEGATIVE
Protein, ur: NEGATIVE mg/dL
Specific Gravity, Urine: 1.025 (ref 1.005–1.030)
pH: 6 (ref 5.0–8.0)

## 2019-04-14 LAB — CMP (CANCER CENTER ONLY)
ALT: 39 U/L (ref 0–44)
AST: 67 U/L — ABNORMAL HIGH (ref 15–41)
Albumin: 2.7 g/dL — ABNORMAL LOW (ref 3.5–5.0)
Alkaline Phosphatase: 135 U/L — ABNORMAL HIGH (ref 38–126)
Anion gap: 12 (ref 5–15)
BUN: 13 mg/dL (ref 8–23)
CO2: 23 mmol/L (ref 22–32)
Calcium: 8.7 mg/dL — ABNORMAL LOW (ref 8.9–10.3)
Chloride: 101 mmol/L (ref 98–111)
Creatinine: 0.63 mg/dL (ref 0.44–1.00)
GFR, Est AFR Am: >60 mL/min (ref >60)
GFR, Estimated: >60 mL/min (ref >60)
Glucose, Bld: 342 mg/dL — ABNORMAL HIGH (ref 70–99)
Potassium: 3.3 mmol/L — ABNORMAL LOW (ref 3.5–5.1)
Sodium: 136 mmol/L (ref 135–145)
Total Bilirubin: 2.5 mg/dL — ABNORMAL HIGH (ref 0.3–1.2)
Total Protein: 6 g/dL — ABNORMAL LOW (ref 6.5–8.1)

## 2019-04-14 LAB — CBC WITH DIFFERENTIAL (CANCER CENTER ONLY)
Abs Immature Granulocytes: 0.01 10*3/uL (ref 0.00–0.07)
Basophils Absolute: 0 10*3/uL (ref 0.0–0.1)
Basophils Relative: 1 %
Eosinophils Absolute: 0.1 10*3/uL (ref 0.0–0.5)
Eosinophils Relative: 2 %
HCT: 29.5 % — ABNORMAL LOW (ref 36.0–46.0)
Hemoglobin: 9.7 g/dL — ABNORMAL LOW (ref 12.0–15.0)
Immature Granulocytes: 0 %
Lymphocytes Relative: 14 %
Lymphs Abs: 0.4 10*3/uL — ABNORMAL LOW (ref 0.7–4.0)
MCH: 35.4 pg — ABNORMAL HIGH (ref 26.0–34.0)
MCHC: 32.9 g/dL (ref 30.0–36.0)
MCV: 107.7 fL — ABNORMAL HIGH (ref 80.0–100.0)
Monocytes Absolute: 0.3 10*3/uL (ref 0.1–1.0)
Monocytes Relative: 11 %
Neutro Abs: 2.2 10*3/uL (ref 1.7–7.7)
Neutrophils Relative %: 72 %
Platelet Count: 85 10*3/uL — ABNORMAL LOW (ref 150–400)
RBC: 2.74 MIL/uL — ABNORMAL LOW (ref 3.87–5.11)
RDW: 16 % — ABNORMAL HIGH (ref 11.5–15.5)
WBC Count: 3 10*3/uL — ABNORMAL LOW (ref 4.0–10.5)
nRBC: 0 % (ref 0.0–0.2)

## 2019-04-14 LAB — LACTATE DEHYDROGENASE: LDH: 374 U/L — ABNORMAL HIGH (ref 98–192)

## 2019-04-14 MED ORDER — SODIUM CHLORIDE 0.9% FLUSH
10.0000 mL | INTRAVENOUS | Status: DC | PRN
  Administered 2019-04-14: 14:00:00 10 mL via INTRAVENOUS
  Filled 2019-04-14: qty 10

## 2019-04-14 MED ORDER — HEPARIN SOD (PORK) LOCK FLUSH 100 UNIT/ML IV SOLN
500.0000 [IU] | Freq: Once | INTRAVENOUS | Status: AC
  Administered 2019-04-14: 500 [IU] via INTRAVENOUS
  Filled 2019-04-14: qty 5

## 2019-04-14 MED ORDER — SODIUM CHLORIDE 0.9 % IV SOLN
1200.0000 mg | Freq: Once | INTRAVENOUS | Status: AC
  Administered 2019-04-14: 1200 mg via INTRAVENOUS
  Filled 2019-04-14: qty 20

## 2019-04-14 MED ORDER — SODIUM CHLORIDE 0.9 % IV SOLN
Freq: Once | INTRAVENOUS | Status: AC
  Filled 2019-04-14: qty 250

## 2019-04-14 MED ORDER — FUROSEMIDE 40 MG PO TABS: 20.0000 mg | ORAL_TABLET | Freq: Every day | ORAL | 4 refills | Status: DC

## 2019-04-14 NOTE — Patient Instructions (Signed)
Atezolizumab injection What is this medicine? ATEZOLIZUMAB (a te zoe LIZ ue mab) is a monoclonal antibody. It is used to treat bladder cancer (urothelial cancer), non-small cell lung cancer, small cell lung cancer, and breast cancer. This medicine may be used for other purposes; ask your health care provider or pharmacist if you have questions. COMMON BRAND NAME(S): Tecentriq What should I tell my health care provider before I take this medicine? They need to know if you have any of these conditions:  diabetes  immune system problems  infection  inflammatory bowel disease  liver disease  lung or breathing disease  lupus  nervous system problems like myasthenia gravis or Guillain-Barre syndrome  organ transplant  an unusual or allergic reaction to atezolizumab, other medicines, foods, dyes, or preservatives  pregnant or trying to get pregnant  breast-feeding How should I use this medicine? This medicine is for infusion into a vein. It is given by a health care professional in a hospital or clinic setting. A special MedGuide will be given to you before each treatment. Be sure to read this information carefully each time. Talk to your pediatrician regarding the use of this medicine in children. Special care may be needed. Overdosage: If you think you have taken too much of this medicine contact a poison control center or emergency room at once. NOTE: This medicine is only for you. Do not share this medicine with others. What if I miss a dose? It is important not to miss your dose. Call your doctor or health care professional if you are unable to keep an appointment. What may interact with this medicine? Interactions have not been studied. This list may not describe all possible interactions. Give your health care provider a list of all the medicines, herbs, non-prescription drugs, or dietary supplements you use. Also tell them if you smoke, drink alcohol, or use illegal drugs.  Some items may interact with your medicine. What should I watch for while using this medicine? Your condition will be monitored carefully while you are receiving this medicine. You may need blood work done while you are taking this medicine. Do not become pregnant while taking this medicine or for at least 5 months after stopping it. Women should inform their doctor if they wish to become pregnant or think they might be pregnant. There is a potential for serious side effects to an unborn child. Talk to your health care professional or pharmacist for more information. Do not breast-feed an infant while taking this medicine or for at least 5 months after the last dose. What side effects may I notice from receiving this medicine? Side effects that you should report to your doctor or health care professional as soon as possible:  allergic reactions like skin rash, itching or hives, swelling of the face, lips, or tongue  black, tarry stools  bloody or watery diarrhea  breathing problems  changes in vision  chest pain or chest tightness  chills  facial flushing  fever  headache  signs and symptoms of high blood sugar such as dizziness; dry mouth; dry skin; fruity breath; nausea; stomach pain; increased hunger or thirst; increased urination  signs and symptoms of liver injury like dark yellow or brown urine; general ill feeling or flu-like symptoms; light-colored stools; loss of appetite; nausea; right upper belly pain; unusually weak or tired; yellowing of the eyes or skin  stomach pain  trouble passing urine or change in the amount of urine Side effects that usually do not require medical  attention (report to your doctor or health care professional if they continue or are bothersome):  cough  diarrhea  joint pain  muscle pain  muscle weakness  tiredness  weight loss This list may not describe all possible side effects. Call your doctor for medical advice about side  effects. You may report side effects to FDA at 1-800-FDA-1088. Where should I keep my medicine? This drug is given in a hospital or clinic and will not be stored at home. NOTE: This sheet is a summary. It may not cover all possible information. If you have questions about this medicine, talk to your doctor, pharmacist, or health care provider.  2020 Elsevier/Gold Standard (2017-06-13 09:33:38) Bevacizumab injection What is this medicine? BEVACIZUMAB (be va SIZ yoo mab) is a monoclonal antibody. It is used to treat many types of cancer. This medicine may be used for other purposes; ask your health care provider or pharmacist if you have questions. COMMON BRAND NAME(S): Avastin, MVASI, Zirabev What should I tell my health care provider before I take this medicine? They need to know if you have any of these conditions:  diabetes  heart disease  high blood pressure  history of coughing up blood  prior anthracycline chemotherapy (e.g., doxorubicin, daunorubicin, epirubicin)  recent or ongoing radiation therapy  recent or planning to have surgery  stroke  an unusual or allergic reaction to bevacizumab, hamster proteins, mouse proteins, other medicines, foods, dyes, or preservatives  pregnant or trying to get pregnant  breast-feeding How should I use this medicine? This medicine is for infusion into a vein. It is given by a health care professional in a hospital or clinic setting. Talk to your pediatrician regarding the use of this medicine in children. Special care may be needed. Overdosage: If you think you have taken too much of this medicine contact a poison control center or emergency room at once. NOTE: This medicine is only for you. Do not share this medicine with others. What if I miss a dose? It is important not to miss your dose. Call your doctor or health care professional if you are unable to keep an appointment. What may interact with this medicine? Interactions are  not expected. This list may not describe all possible interactions. Give your health care provider a list of all the medicines, herbs, non-prescription drugs, or dietary supplements you use. Also tell them if you smoke, drink alcohol, or use illegal drugs. Some items may interact with your medicine. What should I watch for while using this medicine? Your condition will be monitored carefully while you are receiving this medicine. You will need important blood work and urine testing done while you are taking this medicine. This medicine may increase your risk to bruise or bleed. Call your doctor or health care professional if you notice any unusual bleeding. This medicine should be started at least 28 days following major surgery and the site of the surgery should be totally healed. Check with your doctor before scheduling dental work or surgery while you are receiving this treatment. Talk to your doctor if you have recently had surgery or if you have a wound that has not healed. Do not become pregnant while taking this medicine or for 6 months after stopping it. Women should inform their doctor if they wish to become pregnant or think they might be pregnant. There is a potential for serious side effects to an unborn child. Talk to your health care professional or pharmacist for more information. Do not breast-feed  an infant while taking this medicine and for 6 months after the last dose. This medicine has caused ovarian failure in some women. This medicine may interfere with the ability to have a child. You should talk to your doctor or health care professional if you are concerned about your fertility. What side effects may I notice from receiving this medicine? Side effects that you should report to your doctor or health care professional as soon as possible:  allergic reactions like skin rash, itching or hives, swelling of the face, lips, or tongue  chest pain or chest tightness  chills  coughing  up blood  high fever  seizures  severe constipation  signs and symptoms of bleeding such as bloody or black, tarry stools; red or dark-brown urine; spitting up blood or brown material that looks like coffee grounds; red spots on the skin; unusual bruising or bleeding from the eye, gums, or nose  signs and symptoms of a blood clot such as breathing problems; chest pain; severe, sudden headache; pain, swelling, warmth in the leg  signs and symptoms of a stroke like changes in vision; confusion; trouble speaking or understanding; severe headaches; sudden numbness or weakness of the face, arm or leg; trouble walking; dizziness; loss of balance or coordination  stomach pain  sweating  swelling of legs or ankles  vomiting  weight gain Side effects that usually do not require medical attention (report to your doctor or health care professional if they continue or are bothersome):  back pain  changes in taste  decreased appetite  dry skin  nausea  tiredness This list may not describe all possible side effects. Call your doctor for medical advice about side effects. You may report side effects to FDA at 1-800-FDA-1088. Where should I keep my medicine? This drug is given in a hospital or clinic and will not be stored at home. NOTE: This sheet is a summary. It may not cover all possible information. If you have questions about this medicine, talk to your doctor, pharmacist, or health care provider.  2020 Elsevier/Gold Standard (2016-03-08 14:33:29)

## 2019-04-14 NOTE — Telephone Encounter (Signed)
Appointments scheduled calendar/avs printed per 1/20 los

## 2019-04-14 NOTE — Progress Notes (Signed)
Okay to treat today with elevated LFTs and low pltc.  Pt has cirrhosis. Also okay to treat with HR = 102 per Dr. Marin Olp.

## 2019-04-14 NOTE — Progress Notes (Signed)
Hematology and Oncology Follow Up Visit  Beverly Davis [id number] 11-Dec-1940 79 y.o. 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 She is doing fairly well but states that she has had urinary frequency ad urgency causing incontinence at times. This may be related to the Lasix she is taking but we will get a UA and culture to rule out UTI.  She had a liter of fluid removed with thoracentesis last week. Cytology on this was negative. She states that she does not feel and better or any worse after having this done.  No fever, chills, n/v, cough, rash, dizziness, SOB, chest pain, palpitations or changes in bowel or bladder habits.  She has occasional episodes of abdominal pain. AFP several weeks ago was up to 196.  Her appetite comes and goes. She states that she fills up faster when she does eat. She states that she is hydrating properly. Her weight is stable.  She states that she had a little old blood when she brushed her teeth this morning. No other blood loss noted. No petechiae.  The swelling in her lower extremities appears stable. This is a little pronounced in the right leg where she has the DVT.  No tenderness, numbness or tingling in her extremities.  No falls or syncopal episodes to report. She is using a cane when ambulating for added support because she does feel off balance at times.   ECOG Performance Status: 1 - Symptomatic but  completely ambulatory  Medications:  Allergies as of 04/14/2019      Reactions   Metformin And Related Other (See Comments)   Unknown   Welchol [colesevelam Hcl] Other (See Comments)   Unknown      Medication List       Accurate as of April 14, 2019 11:34 AM. If you have any questions, ask your nurse or doctor.        cyanocobalamin 1000 MCG tablet Take 1,000 mcg by mouth daily.   furosemide 40 MG tablet Commonly known as: LASIX Take 1 tablet (40 mg total) by mouth daily.   Januvia 100 MG tablet Generic drug: sitaGLIPtin Take 100 mg by mouth daily.   lactulose 10 GM/15ML solution Commonly known as: CHRONULAC Take 30 mLs (20 g total) by mouth 3 (three) times daily.   levothyroxine 112 MCG tablet Commonly known as: Synthroid Take 1 tablet daily on an empty stomach with only water for 30 minutes & no Antacid meds, Calcium or Magnesium for 4 hours & avoid Biotin   lidocaine-prilocaine cream Commonly known as: EMLA Apply to affected area once   magnesium oxide 400 MG tablet Commonly known as: MAG-OX Take 400 mg by mouth daily.   meclizine 25 MG tablet Commonly known as: ANTIVERT 1/2-1 pill up to 3 times daily as needed dizziness.   metFORMIN 500 MG tablet Commonly known as: Glucophage Take 1 tablet with Breakfast & Lunch and 2 tablets with supper for Diabetes   ondansetron 8 MG tablet Commonly known as: Zofran Take 1 tablet (  8 mg total) by mouth 2 (two) times daily as needed (Nausea or vomiting).   OneTouch Delica Plus Lancet33G Misc 300 Wafers by Does not apply route 3 (three) times daily before meals. Check blood sugar 3 x  /day   OneTouch Ultra test strip Generic drug: glucose blood Check blood Sugar 3 x  /day before Meals   prochlorperazine 10 MG tablet Commonly known as: COMPAZINE Take 1 tablet (10 mg total) by mouth every 6 (six) hours as needed (Nausea or vomiting).   rifaximin 550 MG Tabs tablet Commonly known as: XIFAXAN Take 550 mg by mouth  2 (two) times daily.   Vitamin D3 125 MCG (5000 UT) Caps Take 5,000 Units by mouth daily.       Allergies:  Allergies  Allergen Reactions  . Metformin And Related Other (See Comments)    Unknown  . Welchol [Colesevelam Hcl] Other (See Comments)    Unknown    Past Medical History, Surgical history, Social history, and Family History were reviewed and updated.  Review of Systems: All other 10 point review of systems is negative.   Physical Exam:  height is 5' 2" (1.575 m) and weight is 140 lb (63.5 kg). Her temporal temperature is 97.3 F (36.3 C) (abnormal). Her blood pressure is 127/55 (abnormal) and her pulse is 102 (abnormal). Her respiration is 20 and oxygen saturation is 100%.   Wt Readings from Last 3 Encounters:  04/14/19 140 lb (63.5 kg)  03/17/19 138 lb 12.8 oz (63 kg)  02/25/19 139 lb 1.9 oz (63.1 kg)    Ocular: Sclerae unicteric, pupils equal, round and reactive to light Ear-nose-throat: Oropharynx clear, dentition fair Lymphatic: No cervical or supraclavicular adenopathy Lungs no rales or rhonchi, good excursion bilaterally Heart regular rate and rhythm, no murmur appreciated Abd soft, nontender, positive bowel sounds, no liver or spleen tip palpated on exam, no fluid wave  MSK no focal spinal tenderness, no joint edema Neuro: non-focal, well-oriented, appropriate affect Breasts: Deferred   Lab Results  Component Value Date   WBC 3.0 (L) 04/14/2019   HGB 9.7 (L) 04/14/2019   HCT 29.5 (L) 04/14/2019   MCV 107.7 (H) 04/14/2019   PLT 85 (L) 04/14/2019   Lab Results  Component Value Date   FERRITIN 84 03/17/2019   IRON 72 03/17/2019   TIBC 311 03/17/2019   UIBC 239 03/17/2019   IRONPCTSAT 23 03/17/2019   Lab Results  Component Value Date   RETICCTPCT 1.2 09/05/2015   RBC 2.74 (L) 04/14/2019   RETICCTABS 40,080 09/05/2015   No results found for: KPAFRELGTCHN, LAMBDASER, KAPLAMBRATIO No results found for: IGGSERUM, IGA, IGMSERUM Lab Results    Component Value Date   TOTALPROTELP 7.1 02/03/2008   ALBUMINELP 54.8 (L) 02/03/2008   A1GS 4.2 02/03/2008   A2GS 9.5 02/03/2008   BETS 8.1 (H) 02/03/2008   BETA2SER 5.8 02/03/2008   GAMS 17.6 02/03/2008   MSPIKE NOT DET 02/03/2008   SPEI * 02/03/2008     Chemistry      Component Value Date/Time   NA 136 03/24/2019 1130   NA 139 01/01/2017 1034   K 4.0 03/24/2019 1130   K 4.1 01/01/2017 1034   CL 104 03/24/2019 1130   CL 107 01/01/2017 1034   CO2 22 03/24/2019 1130   CO2 29 01/01/2017 1034   BUN 12 03/24/2019 1130   BUN 9 01/01/2017 1034   CREATININE 0.48 03/24/2019 1130   CREATININE 0.71 01/04/2019 1526      Component Value Date/Time     CALCIUM 9.3 03/24/2019 1130   CALCIUM 9.4 01/01/2017 1034   ALKPHOS 148 (H) 03/24/2019 1130   ALKPHOS 81 01/01/2017 1034   AST 75 (H) 03/24/2019 1130   ALT 46 (H) 03/24/2019 1130   ALT 48 (H) 01/01/2017 1034   BILITOT 2.9 (H) 03/24/2019 1130       Impression and Plan: Beverly Davis is a very pleasant 78yo caucasian female with 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 We will proceed with treatment today, Tecentriq only, as planned per Dr. Ennever.  AFPis pending along with TSH.  CT scan in another 2 months.  She will let us know if she develops in symptoms to suggest she needs another thoracentesis or paracentesis.  Urinalysis and culture are pending. I did decrease her lasix to 20 mg PO daily.  She promises to contact our office with any questions or concerns. We can certainly see her sooner if needed.    , NP 1/20/202111:34 AM  

## 2019-04-15 ENCOUNTER — Telehealth: Payer: Self-pay | Admitting: *Deleted

## 2019-04-15 ENCOUNTER — Ambulatory Visit: Payer: Self-pay | Admitting: Physician Assistant

## 2019-04-15 LAB — T4: T4, Total: 10.2 ug/dL (ref 4.5–12.0)

## 2019-04-15 LAB — URINE CULTURE: Culture: <10000 — AB

## 2019-04-15 LAB — TSH: TSH: 0.427 u[IU]/mL (ref 0.308–3.960)

## 2019-04-15 LAB — AFP TUMOR MARKER: AFP, Serum, Tumor Marker: 184 ng/mL — ABNORMAL HIGH (ref 0.0–8.3)

## 2019-04-15 NOTE — Telephone Encounter (Signed)
Call received from patient requesting to review her appt schedule.  Appointments reviewed with patient.  Patient notified per order of S. [place] NP that "urine hasn't grown anything of significance requiring treatment at this time. No antibiotic needed. Pt reminded that her dose of Lasix has been cut in half. This hopefully will reduce the urgency and frequency she is experiencing. Thank you!!!!!"  Pt verbalized an understanding to take 0.5 mg tablet (20 mg) daily.  Patient appreciative of assistance and has no further questions or concerns at this time.

## 2019-04-19 ENCOUNTER — Telehealth: Payer: Self-pay | Admitting: *Deleted

## 2019-04-19 NOTE — Telephone Encounter (Signed)
Message received from patient's son, [name] stating that pt had SOB and respirations of 32 yesterday and is requesting call to check on patient.  Call placed to patient and patient instructed to go to the ER per order of Dr. Marin Olp.  Pt states that her SOB is better today and that she is not going to the ER.  Pt denies any chest pain, abd pain or nausea.  She does state that she has a slight cough with a small amount of white sputum which is not new.  Pt appreciative of call and requests a call to her son as well. Call placed to [facility] per pt.'s request.  [name] notified of Dr. Antonieta Pert order that he would like for pt to go to the ED, but pt is refusing to go d/t she states SOB is better today.  Instructed Jamie to call office back with any other concerns of patient. [name] appreciative of call and has no questions at this time.  Dr. Marin Olp notified.

## 2019-04-22 ENCOUNTER — Telehealth: Payer: Self-pay | Admitting: Hematology & Oncology

## 2019-04-22 ENCOUNTER — Encounter: Payer: Self-pay | Admitting: General Practice

## 2019-04-22 ENCOUNTER — Telehealth: Payer: Self-pay | Admitting: *Deleted

## 2019-04-22 ENCOUNTER — Other Ambulatory Visit: Payer: Self-pay | Admitting: *Deleted

## 2019-04-22 NOTE — Progress Notes (Signed)
[place] CSW Progress Notes  Call from son, Sierria Bruney.  States patient has been successfully managing her financial affairs for "years", balances checkbook, pays bills on time and so on.  No concerns re her ability to manage her finances.  Pt received unexpected notice yesterday of being sent to collections for unpaid bill for treatment at [facility].  Pt states she never received bill.  Son advised to assist mother in contacting billing office to ensure charges have been correctly applied.  He did so - called back to state that he talked w billing, patient does owe amount she has been informed she does.  She will make payments.  Also advised about process of applying for Medicaid - if eligible, patient's copays and deductibles would be covered as would medications.  Son states that patient has resisted filling prescriptions and/or going for medical visits due to financial concerns.  She pays her "doctor bills first." If Medicaid eligible, patient would have greater access to care.  Also advised that RN make a [facility] referral for outpatient case management - this can address multiple issues related to access to medications and care.  Will follow up in two weeks w family to assess progress.  Edwyna Shell, LCSW Clinical Social Worker Phone:  [phone] Cell:  [phone]

## 2019-04-22 NOTE — Progress Notes (Signed)
[place] CSW Progress Notes  Request from [place] RN to assist patient w obtaining Medicare and Medicaid.  Reviewed chart - she already has [employer] Medicare.  Called patient, states that she has been sent to collection agency for unpaid medical bills from "somewhere in [place]  Michela Pitcher it was for services provided by Dr Marin Olp in June 2020.  "I thought I was keeping up with my payments at [facility], I haven't been receiving any bills."  Advised to call Billing Office to try to determine what to do.  Message sent to [place] to get further information.  CSW is unable to reach patient's son, VMs are full on both numbers.  Advised patient that CSW can email an online application for Medicaid - patient advised that she can have both Medicare and Medicaid.  Karel Jarvis, Estate manager/land agent, asked to mail her application for Medicaid.  If approved, Medicaid may help w copays and deductibles.  Patient also advised to call [facility] billing for a complete understanding of what she owes, whether insurance company has paid their entire portion.  Patient advised that getting the help of a family member may be advisable as this can be a complicated process.  Edwyna Shell, LCSW Clinical Social Worker Phone:  [phone] Cell:  [phone]

## 2019-04-22 NOTE — Telephone Encounter (Signed)
Call received from patient concerned about a bill she received.  Pt instructed to call the phone number on her bill for assistance.  Pt confused regarding insurance and bill she received.  Pt states that she will talk with Baxter Flattery at her next appt regarding financial concerns.  Message sent to CSW, Edwyna Shell for pt assistance and she recommends referral to [facility] and that she will also contact pt.  Anne's assistance much appreciated.

## 2019-04-22 NOTE — Telephone Encounter (Signed)
Mailed pt DSS Medicaid appl advising as such: Please complete attached Medicaid application and drop it off to your local [place] or mail below. Due to Covid, call first before you go, they may be taking appointments only.   [facility] [address] [place], [place] [address] Phone: [phone]  Get a family member or your son to help you complete this form.  If you have any questions, please feel free to contact me. Otilio Carpen, [place] [phone] #[phone]

## 2019-04-29 ENCOUNTER — Telehealth: Payer: Self-pay | Admitting: Hematology & Oncology

## 2019-04-29 NOTE — Telephone Encounter (Signed)
Patient called requesting that 2/10 appt times be moved to later in the morning due to transportation.  Appts moved as requested by patient

## 2019-05-03 ENCOUNTER — Telehealth: Payer: Self-pay | Admitting: *Deleted

## 2019-05-03 NOTE — Telephone Encounter (Signed)
Received a call from [place], [place] son who sees her on a regular basis.  Beverly Davis has an appt with Judson Roch on Wednesday and [name] wanted to let us know how she is doing.  States that patient has been very weak, tired to the point that she couldn't pick up her purse yesterday.  Feels as if this has gotten worse with each treatment. She is SOB, and always tired. She has fallen a few times and dizzy at times.  He is asking about holding treatment so she can recover.  I told him thatthis was absolutely an option and can be discussed at appointment.  Also questioned a scan might need to be ordered.  Told patient that I would relay above information to [facility] NP so she is aware for next treatment.

## 2019-05-05 ENCOUNTER — Telehealth: Payer: Self-pay | Admitting: *Deleted

## 2019-05-05 ENCOUNTER — Inpatient Hospital Stay: Payer: PPO | Admitting: Hematology & Oncology

## 2019-05-05 ENCOUNTER — Other Ambulatory Visit: Payer: Self-pay

## 2019-05-05 ENCOUNTER — Inpatient Hospital Stay: Payer: PPO

## 2019-05-05 ENCOUNTER — Encounter: Payer: Self-pay | Admitting: Family

## 2019-05-05 ENCOUNTER — Telehealth: Payer: Self-pay | Admitting: Hematology & Oncology

## 2019-05-05 ENCOUNTER — Inpatient Hospital Stay: Payer: PPO | Attending: Hematology & Oncology

## 2019-05-05 ENCOUNTER — Inpatient Hospital Stay (HOSPITAL_BASED_OUTPATIENT_CLINIC_OR_DEPARTMENT_OTHER): Payer: PPO | Admitting: Family

## 2019-05-05 VITALS — BP 122/61 | HR 74 | Temp 97.3°F | Resp 19 | Ht 62.0 in | Wt 136.6 lb

## 2019-05-05 DIAGNOSIS — C220 Liver cell carcinoma: Secondary | ICD-10-CM | POA: Insufficient documentation

## 2019-05-05 DIAGNOSIS — K7460 Unspecified cirrhosis of liver: Secondary | ICD-10-CM

## 2019-05-05 DIAGNOSIS — R42 Dizziness and giddiness: Secondary | ICD-10-CM | POA: Insufficient documentation

## 2019-05-05 DIAGNOSIS — I82411 Acute embolism and thrombosis of right femoral vein: Secondary | ICD-10-CM | POA: Diagnosis not present

## 2019-05-05 DIAGNOSIS — K7682 Hepatic encephalopathy: Secondary | ICD-10-CM

## 2019-05-05 DIAGNOSIS — Z853 Personal history of malignant neoplasm of breast: Secondary | ICD-10-CM | POA: Insufficient documentation

## 2019-05-05 DIAGNOSIS — Z170 Estrogen receptor positive status [ER+]: Secondary | ICD-10-CM | POA: Diagnosis not present

## 2019-05-05 DIAGNOSIS — R0602 Shortness of breath: Secondary | ICD-10-CM | POA: Insufficient documentation

## 2019-05-05 DIAGNOSIS — K7290 Hepatic failure, unspecified without coma: Secondary | ICD-10-CM

## 2019-05-05 DIAGNOSIS — B370 Candidal stomatitis: Secondary | ICD-10-CM | POA: Diagnosis not present

## 2019-05-05 DIAGNOSIS — Z79899 Other long term (current) drug therapy: Secondary | ICD-10-CM | POA: Diagnosis not present

## 2019-05-05 DIAGNOSIS — E032 Hypothyroidism due to medicaments and other exogenous substances: Secondary | ICD-10-CM | POA: Diagnosis not present

## 2019-05-05 DIAGNOSIS — Z8505 Personal history of malignant neoplasm of liver: Secondary | ICD-10-CM

## 2019-05-05 DIAGNOSIS — Z5112 Encounter for antineoplastic immunotherapy: Secondary | ICD-10-CM | POA: Diagnosis not present

## 2019-05-05 DIAGNOSIS — Z7901 Long term (current) use of anticoagulants: Secondary | ICD-10-CM | POA: Insufficient documentation

## 2019-05-05 DIAGNOSIS — K7581 Nonalcoholic steatohepatitis (NASH): Secondary | ICD-10-CM

## 2019-05-05 DIAGNOSIS — R05 Cough: Secondary | ICD-10-CM | POA: Insufficient documentation

## 2019-05-05 DIAGNOSIS — R002 Palpitations: Secondary | ICD-10-CM | POA: Diagnosis not present

## 2019-05-05 DIAGNOSIS — Z9181 History of falling: Secondary | ICD-10-CM | POA: Insufficient documentation

## 2019-05-05 LAB — COMPREHENSIVE METABOLIC PANEL
ALT: 35 U/L (ref 0–44)
AST: 60 U/L — ABNORMAL HIGH (ref 15–41)
Albumin: 2.6 g/dL — ABNORMAL LOW (ref 3.5–5.0)
Alkaline Phosphatase: 139 U/L — ABNORMAL HIGH (ref 38–126)
Anion gap: 12 (ref 5–15)
BUN: 10 mg/dL (ref 8–23)
CO2: 22 mmol/L (ref 22–32)
Calcium: 9.1 mg/dL (ref 8.9–10.3)
Chloride: 105 mmol/L (ref 98–111)
Creatinine, Ser: 0.64 mg/dL (ref 0.44–1.00)
GFR calc Af Amer: >60 mL/min (ref >60)
GFR calc non Af Amer: >60 mL/min (ref >60)
Glucose, Bld: 181 mg/dL — ABNORMAL HIGH (ref 70–99)
Potassium: 3 mmol/L — CL (ref 3.5–5.1)
Sodium: 139 mmol/L (ref 135–145)
Total Bilirubin: 2.5 mg/dL — ABNORMAL HIGH (ref 0.3–1.2)
Total Protein: 5.7 g/dL — ABNORMAL LOW (ref 6.5–8.1)

## 2019-05-05 LAB — CBC WITH DIFFERENTIAL/PLATELET
Abs Immature Granulocytes: 0 10*3/uL (ref 0.00–0.07)
Basophils Absolute: 0 10*3/uL (ref 0.0–0.1)
Basophils Relative: 1 %
Eosinophils Absolute: 0.1 10*3/uL (ref 0.0–0.5)
Eosinophils Relative: 2 %
HCT: 29.4 % — ABNORMAL LOW (ref 36.0–46.0)
Hemoglobin: 9.8 g/dL — ABNORMAL LOW (ref 12.0–15.0)
Immature Granulocytes: 0 %
Lymphocytes Relative: 14 %
Lymphs Abs: 0.4 10*3/uL — ABNORMAL LOW (ref 0.7–4.0)
MCH: 35.8 pg — ABNORMAL HIGH (ref 26.0–34.0)
MCHC: 33.3 g/dL (ref 30.0–36.0)
MCV: 107.3 fL — ABNORMAL HIGH (ref 80.0–100.0)
Monocytes Absolute: 0.3 10*3/uL (ref 0.1–1.0)
Monocytes Relative: 11 %
Neutro Abs: 2 10*3/uL (ref 1.7–7.7)
Neutrophils Relative %: 72 %
Platelets: 80 10*3/uL — ABNORMAL LOW (ref 150–400)
RBC: 2.74 MIL/uL — ABNORMAL LOW (ref 3.87–5.11)
RDW: 17 % — ABNORMAL HIGH (ref 11.5–15.5)
WBC: 2.8 10*3/uL — ABNORMAL LOW (ref 4.0–10.5)
nRBC: 0 % (ref 0.0–0.2)

## 2019-05-05 LAB — LACTATE DEHYDROGENASE: LDH: 394 U/L — ABNORMAL HIGH (ref 98–192)

## 2019-05-05 MED ORDER — SODIUM CHLORIDE 0.9 % IV SOLN
1200.0000 mg | Freq: Once | INTRAVENOUS | Status: AC
  Administered 2019-05-05: 1200 mg via INTRAVENOUS
  Filled 2019-05-05: qty 20

## 2019-05-05 MED ORDER — NYSTATIN 100000 UNIT/ML MT SUSP: 5.0000 mL | Freq: Four times a day (QID) | OROMUCOSAL | 1 refills | Status: DC | PRN

## 2019-05-05 MED ORDER — SODIUM CHLORIDE 0.9% FLUSH
10.0000 mL | INTRAVENOUS | Status: DC | PRN
  Administered 2019-05-05: 10 mL
  Filled 2019-05-05: qty 10

## 2019-05-05 MED ORDER — SODIUM CHLORIDE 0.9 % IV SOLN
Freq: Once | INTRAVENOUS | Status: AC
  Filled 2019-05-05: qty 250

## 2019-05-05 MED ORDER — HEPARIN SOD (PORK) LOCK FLUSH 100 UNIT/ML IV SOLN
500.0000 [IU] | Freq: Once | INTRAVENOUS | Status: AC | PRN
  Administered 2019-05-05: 500 [IU]
  Filled 2019-05-05: qty 5

## 2019-05-05 NOTE — Progress Notes (Addendum)
Hematology and Oncology Follow Up Visit  Beverly Davis [id number] 1940/12/04 79 y.o. 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 Coke is here today for follow-up and treatment. She is still getting along pretty well but had a fall last week while trying to turn down a ceiling fan. She said she stood quickly got dizzy and fell. She denies losing consciousness.  The swelling in both legs is still present. More significant in the right leg where she had the blood clot. Compression stockings have been ordered.  She states that she has a film on her tongue and clears her throat. We will get her onto some nystatin swish and swallow liquid.  She has occasional SOB and palpitations with over exertion and will take a break to rest. She states that abdominal discomfort is intermittent. No pain today on exam. Abdominal is soft but mildly distended.   AFP last month was 184.  She keeps a dry cough. She notes minimal blood in her sputum when she gets up in the mornings. No other blood loss noted. No bruising or petechiae.  No fever, chills, n/v, rash, chest pain or changes in bowel or bladder habits.  She states that she is taking her Lactulose as prescribed.  No tenderness, numbness or tingling in her extremities.  No falls or syncope.  Her appetite comes and goes but she is staying well hydrated. Her weight is stable.   ECOG Performance Status: 1 - Symptomatic but completely  ambulatory  Medications:  Allergies as of 05/05/2019      Reactions   Metformin And Related Other (See Comments)   Unknown   Welchol [colesevelam Hcl] Other (See Comments)   Unknown      Medication List       Accurate as of May 05, 2019 12:45 PM. If you have any questions, ask your nurse or doctor.        cyanocobalamin 1000 MCG tablet Take 1,000 mcg by mouth daily.   furosemide 40 MG tablet Commonly known as: LASIX Take 0.5 tablets (20 mg total) by mouth daily.   Januvia 100 MG tablet Generic drug: sitaGLIPtin Take 100 mg by mouth daily.   lactulose 10 GM/15ML solution Commonly known as: CHRONULAC Take 30 mLs (20 g total) by mouth 3 (three) times daily.   levothyroxine 112 MCG tablet Commonly known as: Synthroid Take 1 tablet daily on an empty stomach with only water for 30 minutes & no Antacid meds, Calcium or Magnesium for 4 hours & avoid Biotin   lidocaine-prilocaine cream Commonly known as: EMLA Apply to affected area once   magnesium oxide 400 MG tablet Commonly known as: MAG-OX Take 400 mg by mouth daily.   meclizine 25 MG tablet Commonly known as: ANTIVERT 1/2-1 pill up to 3 times daily as needed dizziness.   metFORMIN 500 MG tablet Commonly known as: Glucophage Take 1 tablet with Breakfast & Lunch and 2 tablets with supper for Diabetes   ondansetron 8 MG tablet Commonly known as: Zofran Take 1 tablet (8 mg total) by mouth 2 (  two) times daily as needed (Nausea or vomiting).   OneTouch Delica Plus [id number] Misc 300 Wafers by Does not apply route 3 (three) times daily before meals. Check blood sugar 3 x  /day   OneTouch Ultra test strip Generic drug: glucose blood Check blood Sugar 3 x  /day before Meals   prochlorperazine 10 MG tablet Commonly known as: COMPAZINE Take 1 tablet (10 mg total) by mouth every 6 (six) hours as needed (Nausea or vomiting).   rifaximin 550 MG Tabs tablet Commonly known as: XIFAXAN Take 550 mg by mouth 2 (two)  times daily.   Vitamin D3 125 MCG (5000 UT) Caps Take 5,000 Units by mouth daily.       Allergies:  Allergies  Allergen Reactions  . Metformin And Related Other (See Comments)    Unknown  . Welchol [Colesevelam Hcl] Other (See Comments)    Unknown    Past Medical History, Surgical history, Social history, and Family History were reviewed and updated.  Review of Systems: All other 10 point review of systems is negative.   Physical Exam:  height is _0  (1.575 m) and weight is 136 lb 9.6 oz (62 kg). Her temporal temperature is 97.3 F (36.3 C) (abnormal). Her blood pressure is 122/61 and her pulse is 74. Her respiration is 19 and oxygen saturation is 100%.   Wt Readings from Last 3 Encounters:  05/05/19 136 lb 9.6 oz (62 kg)  04/14/19 140 lb (63.5 kg)  03/17/19 138 lb 12.8 oz (63 kg)    Ocular: Sclerae unicteric, pupils equal, round and reactive to light Ear-nose-throat: Oropharynx clear, dentition fair Lymphatic: No cervical or supraclavicular adenopathy Lungs no rales or rhonchi, good excursion bilaterally Heart regular rate and rhythm, no murmur appreciated Abd soft, nontender, positive bowel sounds, no liver or spleen tip palpated on exam, no fluid wave  MSK no focal spinal tenderness, no joint edema Neuro: non-focal, well-oriented, appropriate affect Breasts: Deferred   Lab Results  Component Value Date   WBC 2.8 (L) 05/05/2019   HGB 9.8 (L) 05/05/2019   HCT 29.4 (L) 05/05/2019   MCV 107.3 (H) 05/05/2019   PLT 80 (L) 05/05/2019   Lab Results  Component Value Date   FERRITIN 84 03/17/2019   IRON 72 03/17/2019   TIBC 311 03/17/2019   UIBC 239 03/17/2019   IRONPCTSAT 23 03/17/2019   Lab Results  Component Value Date   RETICCTPCT 1.2 09/05/2015   RBC 2.74 (L) 05/05/2019   RETICCTABS 40,080 09/05/2015   No results found for: KPAFRELGTCHN, LAMBDASER, KAPLAMBRATIO No results found for: Kandis Cocking, [facility] Lab Results  Component Value Date    TOTALPROTELP 7.1 02/03/2008   ALBUMINELP 54.8 (L) 02/03/2008   A1GS 4.2 02/03/2008   A2GS 9.5 02/03/2008   BETS 8.1 (H) 02/03/2008   BETA2SER 5.8 02/03/2008   GAMS 17.6 02/03/2008   MSPIKE NOT DET 02/03/2008   SPEI * 02/03/2008     Chemistry      Component Value Date/Time   NA 139 05/05/2019 1045   NA 139 01/01/2017 1034   K 3.0 (LL) 05/05/2019 1045   K 4.1 01/01/2017 1034   CL 105 05/05/2019 1045   CL 107 01/01/2017 1034   CO2 22 05/05/2019 1045   CO2 29 01/01/2017 1034   BUN 10 05/05/2019 1045   BUN 9 01/01/2017 1034   CREATININE 0.64 05/05/2019 1045   CREATININE 0.63 04/14/2019 1100   CREATININE 0.71 01/04/2019 1526      Component Value Date/Time  CALCIUM 9.1 05/05/2019 1045   CALCIUM 9.4 01/01/2017 1034   ALKPHOS 139 (H) 05/05/2019 1045   ALKPHOS 81 01/01/2017 1034   AST 60 (H) 05/05/2019 1045   AST 67 (H) 04/14/2019 1100   ALT 35 05/05/2019 1045   ALT 39 04/14/2019 1100   ALT 48 (H) 01/01/2017 1034   BILITOT 2.5 (H) 05/05/2019 1045   BILITOT 2.5 (H) 04/14/2019 1100       Impression and Plan: Ms. Mallozzi is a very pleasant 79yo caucasian female with 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 We will proceed with treatment today as planned.  Repeat scans in 3 weeks.  Discuss potassium of 3.0 with Dr. Marin Olp and no replacement needed at this time.  Nystatin suspension [facility].  We will plan to see her back in another 3 weeks.  Both she and her family know to contact our office with any questions or concerns and to go to the ED in an emergency.   Laverna Peace, NP 2/10/202112:45 PM

## 2019-05-05 NOTE — Patient Instructions (Signed)
[facility] Discharge Instructions for Patients Receiving Chemotherapy  Today you received the following chemotherapy agents Tecentriq  To help prevent nausea and vomiting after your treatment, we encourage you to take your nausea medication as prescribed by MD.   If you develop nausea and vomiting that is not controlled by your nausea medication, call the clinic.   BELOW ARE SYMPTOMS THAT SHOULD BE REPORTED IMMEDIATELY:  *FEVER GREATER THAN 100.5 F  *CHILLS WITH OR WITHOUT FEVER  NAUSEA AND VOMITING THAT IS NOT CONTROLLED WITH YOUR NAUSEA MEDICATION  *UNUSUAL SHORTNESS OF BREATH  *UNUSUAL BRUISING OR BLEEDING  TENDERNESS IN MOUTH AND THROAT WITH OR WITHOUT PRESENCE OF ULCERS  *URINARY PROBLEMS  *BOWEL PROBLEMS  UNUSUAL RASH Items with * indicate a potential emergency and should be followed up as soon as possible.  Feel free to call the clinic should you have any questions or concerns. The clinic phone number is [phone].  Please show the [place] at check-in to the Emergency Department and triage nurse.

## 2019-05-05 NOTE — Progress Notes (Signed)
Okay to treat today with pltc 80. Dr. Marin Olp is changing the parameters in the treatment plan to reflect okay to treat with pltc > 75.

## 2019-05-05 NOTE — Patient Instructions (Signed)

## 2019-05-05 NOTE — Telephone Encounter (Signed)
Jory Ee NP notified of potassium-3.0.  No new orders received at this time.

## 2019-05-05 NOTE — Telephone Encounter (Signed)
Appointments scheduled calendar/avs printed per 2/10 los

## 2019-05-06 LAB — T4: T4, Total: 10.2 ug/dL (ref 4.5–12.0)

## 2019-05-06 LAB — TSH: TSH: 1.509 u[IU]/mL (ref 0.308–3.960)

## 2019-05-12 ENCOUNTER — Telehealth: Payer: Self-pay | Admitting: General Practice

## 2019-05-12 ENCOUNTER — Other Ambulatory Visit: Payer: Self-pay | Admitting: Family

## 2019-05-12 DIAGNOSIS — C220 Liver cell carcinoma: Secondary | ICD-10-CM

## 2019-05-12 NOTE — Telephone Encounter (Signed)
[place] CSW Progress Notes  Call to son to assess progress on goals from last call.  Per son, patient has arranged payment plan for past due bill.  Is applying for Medicaid, has not heard decision yet.  Has filled prescriptions, but has also expressed concerns about side effects.  Provider asked to discuss medications w patient so that patient concerns can be addressed by medical team.  CSW also recommended [facility] referral - do not see that this has been done - will contact [facility] staff to discuss their wishes re referral.  Edwyna Shell, LCSW Clinical Social Worker Phone:  [phone] Cell:  [phone]

## 2019-05-13 ENCOUNTER — Inpatient Hospital Stay: Payer: PPO | Admitting: General Practice

## 2019-05-14 ENCOUNTER — Telehealth: Payer: Self-pay | Admitting: General Practice

## 2019-05-14 ENCOUNTER — Encounter: Payer: Self-pay | Admitting: General Practice

## 2019-05-14 NOTE — Telephone Encounter (Signed)
[place] CSW Progress Notes  Call from [place], [place] CM, wants more information on referral made to [facility], wants to know patient needs.  Concerned that initial referral was due to financial distress.  CSW clarified that concerns related to coordinator of care in the outpatient setting, multiple medications and conditions, concerns about patient not getting medications/accessing care.  Also referral to [place] NP for more clarification.  Edwyna Shell, LCSW Clinical Social Worker Phone:  [phone]

## 2019-05-19 ENCOUNTER — Other Ambulatory Visit: Payer: Self-pay | Admitting: Adult Health

## 2019-05-19 ENCOUNTER — Encounter: Payer: Self-pay | Admitting: Adult Health

## 2019-05-19 ENCOUNTER — Ambulatory Visit: Payer: Self-pay | Admitting: Physician Assistant

## 2019-05-19 ENCOUNTER — Other Ambulatory Visit: Payer: Self-pay

## 2019-05-19 ENCOUNTER — Ambulatory Visit (INDEPENDENT_AMBULATORY_CARE_PROVIDER_SITE_OTHER): Payer: PPO | Admitting: Adult Health

## 2019-05-19 VITALS — BP 112/68 | HR 90 | Temp 97.5°F | Ht 62.0 in | Wt 144.0 lb

## 2019-05-19 DIAGNOSIS — E559 Vitamin D deficiency, unspecified: Secondary | ICD-10-CM

## 2019-05-19 DIAGNOSIS — R011 Cardiac murmur, unspecified: Secondary | ICD-10-CM | POA: Diagnosis not present

## 2019-05-19 DIAGNOSIS — I82411 Acute embolism and thrombosis of right femoral vein: Secondary | ICD-10-CM

## 2019-05-19 DIAGNOSIS — Z0000 Encounter for general adult medical examination without abnormal findings: Secondary | ICD-10-CM

## 2019-05-19 DIAGNOSIS — R188 Other ascites: Secondary | ICD-10-CM

## 2019-05-19 DIAGNOSIS — J90 Pleural effusion, not elsewhere classified: Secondary | ICD-10-CM | POA: Insufficient documentation

## 2019-05-19 DIAGNOSIS — R151 Fecal smearing: Secondary | ICD-10-CM

## 2019-05-19 DIAGNOSIS — S12090D Other displaced fracture of first cervical vertebra, subsequent encounter for fracture with routine healing: Secondary | ICD-10-CM

## 2019-05-19 DIAGNOSIS — K7682 Hepatic encephalopathy: Secondary | ICD-10-CM

## 2019-05-19 DIAGNOSIS — D61818 Other pancytopenia: Secondary | ICD-10-CM

## 2019-05-19 DIAGNOSIS — Z853 Personal history of malignant neoplasm of breast: Secondary | ICD-10-CM

## 2019-05-19 DIAGNOSIS — E1169 Type 2 diabetes mellitus with other specified complication: Secondary | ICD-10-CM | POA: Diagnosis not present

## 2019-05-19 DIAGNOSIS — K7290 Hepatic failure, unspecified without coma: Secondary | ICD-10-CM

## 2019-05-19 DIAGNOSIS — R911 Solitary pulmonary nodule: Secondary | ICD-10-CM | POA: Diagnosis not present

## 2019-05-19 DIAGNOSIS — E538 Deficiency of other specified B group vitamins: Secondary | ICD-10-CM

## 2019-05-19 DIAGNOSIS — M810 Age-related osteoporosis without current pathological fracture: Secondary | ICD-10-CM

## 2019-05-19 DIAGNOSIS — K7581 Nonalcoholic steatohepatitis (NASH): Secondary | ICD-10-CM | POA: Diagnosis not present

## 2019-05-19 DIAGNOSIS — I10 Essential (primary) hypertension: Secondary | ICD-10-CM | POA: Diagnosis not present

## 2019-05-19 DIAGNOSIS — E785 Hyperlipidemia, unspecified: Secondary | ICD-10-CM

## 2019-05-19 DIAGNOSIS — R6889 Other general symptoms and signs: Secondary | ICD-10-CM | POA: Diagnosis not present

## 2019-05-19 DIAGNOSIS — Z0001 Encounter for general adult medical examination with abnormal findings: Secondary | ICD-10-CM

## 2019-05-19 DIAGNOSIS — E1122 Type 2 diabetes mellitus with diabetic chronic kidney disease: Secondary | ICD-10-CM | POA: Diagnosis not present

## 2019-05-19 DIAGNOSIS — K7460 Unspecified cirrhosis of liver: Secondary | ICD-10-CM

## 2019-05-19 DIAGNOSIS — E032 Hypothyroidism due to medicaments and other exogenous substances: Secondary | ICD-10-CM | POA: Diagnosis not present

## 2019-05-19 DIAGNOSIS — R3981 Functional urinary incontinence: Secondary | ICD-10-CM

## 2019-05-19 DIAGNOSIS — C220 Liver cell carcinoma: Secondary | ICD-10-CM | POA: Diagnosis not present

## 2019-05-19 DIAGNOSIS — Z1231 Encounter for screening mammogram for malignant neoplasm of breast: Secondary | ICD-10-CM

## 2019-05-19 DIAGNOSIS — N182 Chronic kidney disease, stage 2 (mild): Secondary | ICD-10-CM

## 2019-05-19 NOTE — Progress Notes (Signed)
MEDICARE ANNUAL WELLNESS VISIT AND FOLLOW UP  Assessment:    Encounter for Medicare annual wellness exam 1 year  Essential hypertension Continue medications Monitor blood pressure at home; call if consistently over 130/80 Continue DASH diet.   Reminder to go to the ER if any CP, SOB, nausea, dizziness, severe HA, changes vision/speech, left arm numbness and tingling and jaw pain.  Hepatocellular carcinoma ([place]) Followed by Dr. Marin Olp and Dr. Benson Norway  Hepatoma [facility]) Continue follow up  Hepatic Encephalopathy ([place]) Continue with lactulose Has labs routinely by cancer center  Type 2 diabetes mellitus with stage 2 chronic kidney disease, without long-term current use of insulin [facility]) Education: Reviewed 'ABCs' of diabetes management (respective goals in parentheses):  A1C (<7), blood pressure (<130/80), and cholesterol (LDL <70) Eye Exam yearly and Dental Exam every 6 months. Dietary recommendations Physical Activity recommendations If next A1C remains well controlled D/c januvia due to cost burden  CKD 2 associated with T2DM (HCC) Increase fluids, avoid NSAIDS, monitor sugars, will monitor  Hyperlipidemia, associated with T2DM ([place]) Not on meds secondary to liver disease Continue low cholesterol diet and exercise.  Check lipid panel routinely, defer today as last check <90 days  Hypothyroidism, unspecified type continue medications the same pending lab results reminded to take on an empty stomach 30-78mns before food.  check TSH level  Pancytopenia (HCC) CBC, followed by Dr. EMarin Olp Vitamin D deficiency At goal at recent check; continue to recommend supplementation for goal of 60-100 Defer vitamin D level  Medication management CBC, CMP/GFR, magnesium   History of breast cancer S/p lumpectomy, continue with annual mammograms Follows with Dr. EMarin Olp Pulmonary nodule Recently imaged in 02/2019; continue monitoring  C1 compression fracture (HCC) Poor  candidate for surgery; monitor; avoid heavy lifting; pain significantly improved/resolved  Acute DVT of RLE ([place] Xarelto d/c'd due to GI bleed; s/p IVC filter 03/18/2019; monitor   Osteoporosis, unspecified osteoporosis type, unspecified pathological fracture presence Off fosamax will repeat DEXA 09/2019 Had GI bleed, suspect for esophageal varices, dementia/encephalopathy, poor candidate for fosamax; cost limits prolia Will encourage calcium/vitamin D, weight bearing exercises  Recurrent R pleural effusion Pending possible repeat thoracentesis per oncology; monitor Notify office if progressive dyspnea   Frequent labs via cancer center; last A1C and lipids were <90 days and well controlled; defer labs today  Over 40 minutes of exam, counseling, chart review and critical decision making was performed Future Appointments  Date Time Provider DBasin 05/25/2019  9:45 AM [facility] LAB [facility] None  05/25/2019 10:00 AM [facility] INJ NURSE [facility] None  05/25/2019 10:00 AM [facility]  05/25/2019 10:30 AM [facility]  05/25/2019 11:00 AM En[place], PRudell Cobb MD [facility] None  05/25/2019 11:30 AM [facility] B1 [facility] None  09/02/2019 10:00 AM MUnk Pinto MD [facility] None     Plan:   During the course of the visit the patient was educated and counseled about appropriate screening and preventive services including:    Pneumococcal vaccine   Prevnar 13  Influenza vaccine  Td vaccine  Screening electrocardiogram  Bone densitometry screening  Colorectal cancer screening  Diabetes screening  Glaucoma screening  Nutrition counseling   Advanced directives: requested   Subjective:  Beverly OUBREis a 79y.o. female who presents for Medicare Annual Wellness Visit and 3 month follow up.   Patient has remote hx/o Rt Breast cancer in 2008. 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 In Nov 2017 she was discovered to have [facility] and in Feb 2018, she had Radio-embolization of a Rt Hepatic Lobe HCC, then followed in March 2018 with a thermal microwave ablation. She has history of asymptomatic portal vein hypertension.  Increase in AFP prompted an MRI liver that demonstrated reoccurence, she is s/p  CT-guided tissue ablation of right liver on 01/21/2018. She has been underoing Tecentriq/Avastin -- started on07/09/2018- s/p cycle8- Avastin d/c'ed due to thromboembolic disease of R popliteal/peroneal vein. She was on Xarelto 20 mg po q day -- stopped on 03/16/2019 due to GI bleed, IVC filter placed on 03/18/2019.    She follows with Dr. Benson Norway and Marin Olp. Also follows for incidental pulmonary nodules and pancytopenia. Recently with distended abdomen/ascites; also known R pleural effusion; have been discussing possible need for repeat thoracentesis/paracentesis. She is on lactulose due to hepatic encephalopathy, has diarrhea with occasional soiling with straining. Wears pull ups. Couldn't afford xifaxan. She does have bedside commode to use, but per husband "she is too stubborn to use it."   BMI is Body mass index is 26.34 kg/m., she has not been working on diet and exercise. Wt Readings from Last 3 Encounters:  05/19/19 144 lb (65.3 kg)  05/05/19 136 lb 9.6 oz (62 kg)  04/14/19 140 lb (63.5 kg)   Today their BP is BP: 112/68 She does not workout. She denies chest pain, dizziness. She does report getting short of breath with pulling herself up in bed or using walker to go up and down stairs.   She is not on cholesterol medication due to her liver disease. Her cholesterol is not at goal though fairly controlled LDL at <100. The cholesterol last visit was:   Lab Results  Component Value Date   CHOL 151 03/24/2019   HDL 38 (L) 03/24/2019   LDLCALC 93 03/24/2019   TRIG 99 03/24/2019   CHOLHDL 4.0 03/24/2019   . She has been working on diet for T2 diabetes  (A1C 9.4% in 2017) recently very well controlled on metformin 1500 mg daily, Januvia 100 mg daily, and denies foot ulcerations, increased appetite, nausea, paresthesia of the feet, polydipsia, polyuria, visual disturbances, vomiting and weight loss. She does check fasting blood sugars and presents with log today; ranges 130-140's. Last A1C in the office was:  Lab Results  Component Value Date   HGBA1C 4.9 03/24/2019  Some question of accuracy, would expect higher A1C with recorded fasting values.  She has CKD 2 associated with T2DM monitored closely:  Lab Results  Component Value Date   GFRNONAA >60 05/05/2019   Patient is on Vitamin D supplement.   Lab Results  Component Value Date   VD25OH 76 07/29/2018     She is on thyroid medication. Her medication was not changed last visit, M,W,F 1 pill and every other day 1/2 pill.  Lab Results  Component Value Date   TSH 1.509 05/05/2019      Medication Review: Current Outpatient Medications on File Prior to Visit  Medication Sig Dispense Refill  . Cholecalciferol (VITAMIN D3) 5000 units CAPS Take 5,000 Units by mouth daily.    . cyanocobalamin 1000 MCG tablet Take 1,000 mcg by mouth daily.    . furosemide (LASIX) 40 MG tablet Take 0.5 tablets (20 mg total) by mouth daily. 30 tablet 4  . glucose blood (ONETOUCH ULTRA) test strip Check blood Sugar 3 x  /day before Meals 300 each 3  . lactulose (CHRONULAC)  10 GM/15ML solution Take 30 mLs (20 g total) by mouth 3 (three) times daily. 240 mL 3  . Lancets (ONETOUCH DELICA PLUS ERDEYC14G) MISC 300 Wafers by Does not apply route 3 (three) times daily before meals. Check blood sugar 3 x  /day 300 each 3  . levothyroxine (SYNTHROID) 112 MCG tablet Take 1 tablet daily on an empty stomach with only water for 30 minutes & no Antacid meds, Calcium or Magnesium for 4 hours & avoid Biotin 90 tablet 3  . lidocaine-prilocaine (EMLA) cream Apply to affected area once 30 g 3  . magnesium oxide (MAG-OX) 400 MG  tablet Take 400 mg by mouth daily.     . meclizine (ANTIVERT) 25 MG tablet 1/2-1 pill up to 3 times daily as needed dizziness. 30 tablet 0  . metFORMIN (GLUCOPHAGE) 500 MG tablet Take 1 tablet with Breakfast & Lunch and 2 tablets with supper for Diabetes 360 tablet 3  . nystatin (MYCOSTATIN) 100000 UNIT/ML suspension Take 5 mLs (500,000 Units total) by mouth 4 (four) times daily as needed. Swish and swallow. 473 mL 1  . ondansetron (ZOFRAN) 8 MG tablet Take 1 tablet (8 mg total) by mouth 2 (two) times daily as needed (Nausea or vomiting). 30 tablet 1  . prochlorperazine (COMPAZINE) 10 MG tablet Take 1 tablet (10 mg total) by mouth every 6 (six) hours as needed (Nausea or vomiting). 30 tablet 1  . rifaximin (XIFAXAN) 550 MG TABS tablet Take 550 mg by mouth 2 (two) times daily.    . sitaGLIPtin (JANUVIA) 100 MG tablet Take 100 mg by mouth daily.     Current Facility-Administered Medications on File Prior to Visit  Medication Dose Route Frequency Provider Last Rate Last Admin  . sodium chloride flush (NS) 0.9 % injection 10 mL  10 mL Intravenous PRN [name], MD   10 mL at 02/05/19 1424    Allergies  Allergen Reactions  . Metformin And Related Other (See Comments)    Unknown  . Welchol [Colesevelam Hcl] Other (See Comments)    Unknown    Current Problems (verified) Patient Active Problem List   Diagnosis Date Noted  . DVT (deep venous thrombosis) ([place]) 03/11/2019  . Functional urinary incontinence 01/11/2019  . Compression fracture of C1 vertebra ([place]) 01/07/2019  . Murmur 01/04/2019  . Goals of care, counseling/discussion 09/17/2018  . History of hepatocellular carcinoma 07/29/2018  . Liver cirrhosis secondary to NASH ([place]) 07/29/2018  . FHx: heart disease 07/29/2018  . B12 deficiency 07/29/2018  . Pulmonary nodule 04/08/2018  . Hepatoma ([place]) 01/21/2018  . Poor compliance 11/26/2017  . Hepatocellular carcinoma ([place]) 05/08/2016  . Pancytopenia ([place])   . Hepatic  encephalopathy ([place]) 03/18/2016  . Hyperlipidemia associated with type 2 diabetes mellitus ([place]) 12/14/2014  . Hypothyroidism 05/18/2014  . Essential hypertension 05/18/2014  . Vitamin D deficiency 05/18/2014  . Type 2 diabetes mellitus with stage 2 chronic kidney disease, without long-term current use of insulin ([place]) 01/12/2014  . Osteoporosis   . History of right breast cancer 09/30/2012    Screening Tests Immunization History  Administered Date(s) Administered  . Influenza Split 03/06/2011, 01/06/2013  . Influenza, High Dose Seasonal PF 01/12/2014, 12/14/2014, 12/27/2015, 01/22/2017, 12/17/2017, 01/14/2019  . Pneumococcal Conjugate-13 01/12/2014  . Pneumococcal Polysaccharide-23 08/30/2014  . Tdap 06/10/2012   Preventative care: Last colonoscopy: 2016 Benson Norway) Mammogram:  05/2017,  Dexa:09/2017 osteopenia -2.4,   Prior vaccinations: TD or Tdap: 2014  Influenza: 12/2018  Pneumococcal: 2015 Prevnar13: 2016 Shingles/Zostavax: Declines  Names  of Other Physician/Practitioners you currently use: 1. [facility] here for primary care 2. Dr. Prudencio Burly, eye doctor, last visit -  2020, has had R cataract, monitoring left cataract  3. Dr. Belva Agee, dentist, last visit 2020  Patient Care Team: Unk Pinto, MD as PCP - General (Internal Medicine) Carol Ada, MD as Consulting Physician (Gastroenterology) Inda Castle, MD (Inactive) as Consulting Physician (Gastroenterology) [name], MD as Consulting Physician (Oncology)  SURGICAL HISTORY She  has a past surgical history that includes Breast lumpectomy with axillary lymph node biopsy (Right, 06/25/2006); Cholecystectomy; Abdominal hysterectomy; Tubal ligation (1978); ir generic historical (03/28/2016); ir generic historical (05/01/2016); ir generic historical (05/01/2016); ir generic historical (05/01/2016); ir generic historical (05/01/2016); ir generic historical (05/01/2016); ir generic historical  (05/01/2016); ir generic historical (05/01/2016); ir generic historical (05/01/2016); ir generic historical (05/01/2016); Radiofrequency ablation (N/A, 06/14/2016); IR Radiologist Eval & Mgmt (07/31/2016); IR Radiologist Eval & Mgmt (12/25/2016); Breast lumpectomy (Right, 2008); IR Radiologist Eval & Mgmt (12/10/2017); Radiology with anesthesia (Right, 01/21/2018); IR Radiologist Eval & Mgmt (04/21/2018); IR Radiologist Eval & Mgmt (08/14/2018); IR IMAGING GUIDED PORT INSERTION (09/28/2018); IR IVC FILTER PLMT / S&I /IMG GUID/MOD SED (03/18/2019); and IR THORACENTESIS ASP PLEURAL SPACE W/IMG GUIDE (04/08/2019).   FAMILY HISTORY Her family history includes Cancer in her father, mother, and paternal grandfather; Diabetes in her maternal aunt, sister, and sister; Heart Problems in her maternal grandfather; Hypertension in her sister; Liver disease in her brother.   SOCIAL HISTORY She  reports that she has never smoked. She has never used smokeless tobacco. She reports that she does not drink alcohol or use drugs.   MEDICARE WELLNESS OBJECTIVES: Physical activity: Current Exercise Habits: The patient does not participate in regular exercise at present, Exercise limited by: Other - see comments Cardiac risk factors: Cardiac Risk Factors include: advanced age (>55mn, >>44women);dyslipidemia;diabetes mellitus;hypertension;obesity (BMI >30kg/m2);sedentary lifestyle Depression/mood screen:   [facility]  Decreased Interest 0  Down, Depressed, Hopeless 0  PHQ - 2 Score 0  Some recent data might be hidden    ADLs:  In your present state of health, do you have any difficulty performing the following activities: 05/19/2019 03/18/2019  Hearing? Y N  Comment can't afford hearing aids -  Vision? N N  Difficulty concentrating or making decisions? YTempie Donning Walking or climbing stairs? N N  Comment uses walker and cane -  Dressing or bathing? N N  Doing errands, shopping? N -  Comment husband drives -   Some recent data might be hidden     Cognitive Testing  Alert? Yes  Normal Appearance?Yes  Oriented to person? Yes  Place? Yes   Time? Yes  Recall of three objects?  No 2/3  Can perform simple calculations? Yes  Displays appropriate judgment?Yes  Can read the correct time from a watch face?Yes  EOL planning: Does Patient Have a Medical Advance Directive?: No Type of Advance Directive: Healthcare Power of Attorney, Living will Does patient want to make changes to medical advance directive?: No - Patient declined Copy of [place] Chart?: No - copy requested Would patient like information on creating a medical advance directive?: No - Patient declined  Review of Systems  Constitutional: Negative for malaise/fatigue and weight loss.  HENT: Negative for hearing loss and tinnitus.   Eyes: Negative for blurred vision and double vision.  Respiratory: Negative for cough, sputum production, shortness of breath and wheezing.   Cardiovascular: Negative  for chest pain, palpitations, orthopnea, claudication, leg swelling and PND.  Gastrointestinal: Positive for diarrhea. Negative for abdominal pain, blood in stool, constipation, heartburn, melena, nausea and vomiting.  Genitourinary: Negative.   Musculoskeletal: Negative for falls, joint pain and myalgias.  Skin: Negative for rash.  Neurological: Negative for dizziness, tingling, sensory change, weakness and headaches.  Endo/Heme/Allergies: Negative for polydipsia.  Psychiatric/Behavioral: Negative.  Negative for depression, memory loss, substance abuse and suicidal ideas. The patient is not nervous/anxious and does not have insomnia.   All other systems reviewed and are negative.    Objective:     Today's Vitals   05/19/19 1448  BP: 112/68  Pulse: 90  Temp: (!) 97.5 F (36.4 C)  SpO2: 97%  Weight: 144 lb (65.3 kg)  Height: 5' 2"  (1.575 m)   Body mass index is 26.34 kg/m.  General appearance: alert, no  distress, WD/WN, female HEENT: normocephalic, sclerae anicteric, TMs pearly, nares patent, no discharge or erythema, pharynx normal Oral cavity: MMM, no lesions Neck: supple, no lymphadenopathy, no thyromegaly, no masses Heart: RRR, normal S1, S2, 2/6 systolic murmur without radiation, loudest over L 2nd ICS.  Lungs: CTA bilaterally, no wheezes, rhonchi, or rales; diminished RLL Abdomen: +bs, rounded, firm, no fluid wave, no palpable masses or organomegaly Musculoskeletal: nontender, no obvious deformity, mild 1+ pitting L ankle, 2+ pitting and mild warmth R ankle and lower leg.  Extremities: no edema, no cyanosis, no clubbing Pulses: 2+ symmetric, upper and lower extremities, normal cap refill Neurological: alert, oriented x 3, CN2-12 intact, strength normal upper extremities and lower extremities, sensation normal throughout, DTRs 2+ throughout, no cerebellar signs, gait is slow/stead. Slowed cognition/mental thought.  Psychiatric: normal affect, behavior normal, pleasant   Medicare Attestation I have personally reviewed: The patient's medical and social history Their use of alcohol, tobacco or illicit drugs Their current medications and supplements The patient's functional ability including ADLs,fall risks, home safety risks, cognitive, and hearing and visual impairment Diet and physical activities Evidence for depression or mood disorders  The patient's weight, height, BMI, and visual acuity have been recorded in the chart.  I have made referrals, counseling, and provided education to the patient based on review of the above and I have provided the patient with a written personalized care plan for preventive services.     Izora Ribas, NP   05/19/2019

## 2019-05-19 NOTE — Patient Instructions (Addendum)
  Beverly Davis , Thank you for taking time to come for your Medicare Wellness Visit. I appreciate your ongoing commitment to your health goals. Please review the following plan we discussed and let me know if I can assist you in the future.   These are the goals we discussed: Goals    . Exercise - 15-30 min of walking daily       This is a list of the screening recommended for you and due dates:  Health Maintenance  Topic Date Due  . Complete foot exam   07/29/2019  . Urine Protein Check  07/29/2019  . Hemoglobin A1C  09/22/2019  . Eye exam for diabetics  11/25/2019  . Tetanus Vaccine  06/11/2022  . Flu Shot  Completed  . DEXA scan (bone density measurement)  Completed  . Pneumonia vaccines  Completed      Please schedule mammogram and bone density in July 2021  HOW TO SCHEDULE A MAMMOGRAM  [facility]  7 a.m.-6:30 p.m., Monday 7 a.m.-5 p.m., Tuesday-Friday Schedule an appointment by calling [phone].

## 2019-05-25 ENCOUNTER — Inpatient Hospital Stay: Payer: PPO | Attending: Hematology & Oncology

## 2019-05-25 ENCOUNTER — Ambulatory Visit (HOSPITAL_BASED_OUTPATIENT_CLINIC_OR_DEPARTMENT_OTHER)
Admission: RE | Admit: 2019-05-25 | Discharge: 2019-05-25 | Disposition: A | Discharge status: Home / Self Care | Payer: PPO | Source: Ambulatory Visit | Attending: Family | Admitting: Family

## 2019-05-25 ENCOUNTER — Inpatient Hospital Stay: Payer: PPO

## 2019-05-25 ENCOUNTER — Encounter: Payer: Self-pay | Admitting: Hematology & Oncology

## 2019-05-25 ENCOUNTER — Other Ambulatory Visit: Payer: Self-pay

## 2019-05-25 ENCOUNTER — Inpatient Hospital Stay (HOSPITAL_BASED_OUTPATIENT_CLINIC_OR_DEPARTMENT_OTHER): Payer: PPO | Admitting: Hematology & Oncology

## 2019-05-25 VITALS — BP 113/73 | HR 72 | Temp 97.0°F | Resp 18 | Ht 62.0 in | Wt 139.0 lb

## 2019-05-25 DIAGNOSIS — C220 Liver cell carcinoma: Secondary | ICD-10-CM

## 2019-05-25 DIAGNOSIS — Z853 Personal history of malignant neoplasm of breast: Secondary | ICD-10-CM | POA: Diagnosis not present

## 2019-05-25 DIAGNOSIS — R5381 Other malaise: Secondary | ICD-10-CM | POA: Diagnosis not present

## 2019-05-25 DIAGNOSIS — Z170 Estrogen receptor positive status [ER+]: Secondary | ICD-10-CM | POA: Insufficient documentation

## 2019-05-25 DIAGNOSIS — Z8619 Personal history of other infectious and parasitic diseases: Secondary | ICD-10-CM | POA: Diagnosis not present

## 2019-05-25 DIAGNOSIS — Z79899 Other long term (current) drug therapy: Secondary | ICD-10-CM | POA: Diagnosis not present

## 2019-05-25 DIAGNOSIS — Z452 Encounter for adjustment and management of vascular access device: Secondary | ICD-10-CM | POA: Insufficient documentation

## 2019-05-25 DIAGNOSIS — E032 Hypothyroidism due to medicaments and other exogenous substances: Secondary | ICD-10-CM

## 2019-05-25 DIAGNOSIS — K7460 Unspecified cirrhosis of liver: Secondary | ICD-10-CM | POA: Diagnosis not present

## 2019-05-25 DIAGNOSIS — R5383 Other fatigue: Secondary | ICD-10-CM | POA: Insufficient documentation

## 2019-05-25 DIAGNOSIS — R188 Other ascites: Secondary | ICD-10-CM | POA: Insufficient documentation

## 2019-05-25 DIAGNOSIS — J90 Pleural effusion, not elsewhere classified: Secondary | ICD-10-CM | POA: Diagnosis not present

## 2019-05-25 DIAGNOSIS — K7290 Hepatic failure, unspecified without coma: Secondary | ICD-10-CM

## 2019-05-25 DIAGNOSIS — R197 Diarrhea, unspecified: Secondary | ICD-10-CM | POA: Insufficient documentation

## 2019-05-25 DIAGNOSIS — K7682 Hepatic encephalopathy: Secondary | ICD-10-CM

## 2019-05-25 DIAGNOSIS — K7581 Nonalcoholic steatohepatitis (NASH): Secondary | ICD-10-CM | POA: Diagnosis not present

## 2019-05-25 DIAGNOSIS — I82411 Acute embolism and thrombosis of right femoral vein: Secondary | ICD-10-CM

## 2019-05-25 LAB — CBC WITH DIFFERENTIAL (CANCER CENTER ONLY)
Abs Immature Granulocytes: 0.01 10*3/uL (ref 0.00–0.07)
Basophils Absolute: 0 10*3/uL (ref 0.0–0.1)
Basophils Relative: 1 %
Eosinophils Absolute: 0 10*3/uL (ref 0.0–0.5)
Eosinophils Relative: 2 %
HCT: 28.3 % — ABNORMAL LOW (ref 36.0–46.0)
Hemoglobin: 9.4 g/dL — ABNORMAL LOW (ref 12.0–15.0)
Immature Granulocytes: 0 %
Lymphocytes Relative: 21 %
Lymphs Abs: 0.5 10*3/uL — ABNORMAL LOW (ref 0.7–4.0)
MCH: 35.9 pg — ABNORMAL HIGH (ref 26.0–34.0)
MCHC: 33.2 g/dL (ref 30.0–36.0)
MCV: 108 fL — ABNORMAL HIGH (ref 80.0–100.0)
Monocytes Absolute: 0.3 10*3/uL (ref 0.1–1.0)
Monocytes Relative: 13 %
Neutro Abs: 1.6 10*3/uL — ABNORMAL LOW (ref 1.7–7.7)
Neutrophils Relative %: 63 %
Platelet Count: 86 10*3/uL — ABNORMAL LOW (ref 150–400)
RBC: 2.62 MIL/uL — ABNORMAL LOW (ref 3.87–5.11)
RDW: 17.4 % — ABNORMAL HIGH (ref 11.5–15.5)
WBC Count: 2.5 10*3/uL — ABNORMAL LOW (ref 4.0–10.5)
nRBC: 0 % (ref 0.0–0.2)

## 2019-05-25 LAB — CMP (CANCER CENTER ONLY)
ALT: 32 U/L (ref 0–44)
AST: 56 U/L — ABNORMAL HIGH (ref 15–41)
Albumin: 2.7 g/dL — ABNORMAL LOW (ref 3.5–5.0)
Alkaline Phosphatase: 135 U/L — ABNORMAL HIGH (ref 38–126)
Anion gap: 8 (ref 5–15)
BUN: 10 mg/dL (ref 8–23)
CO2: 23 mmol/L (ref 22–32)
Calcium: 8.7 mg/dL — ABNORMAL LOW (ref 8.9–10.3)
Chloride: 106 mmol/L (ref 98–111)
Creatinine: 0.54 mg/dL (ref 0.44–1.00)
GFR, Est AFR Am: >60 mL/min (ref >60)
GFR, Estimated: >60 mL/min (ref >60)
Glucose, Bld: 125 mg/dL — ABNORMAL HIGH (ref 70–99)
Potassium: 3.7 mmol/L (ref 3.5–5.1)
Sodium: 137 mmol/L (ref 135–145)
Total Bilirubin: 2.5 mg/dL — ABNORMAL HIGH (ref 0.3–1.2)
Total Protein: 6.1 g/dL — ABNORMAL LOW (ref 6.5–8.1)

## 2019-05-25 LAB — AMMONIA: Ammonia: 44 umol/L — ABNORMAL HIGH (ref 9–35)

## 2019-05-25 LAB — LACTATE DEHYDROGENASE: LDH: 362 U/L — ABNORMAL HIGH (ref 98–192)

## 2019-05-25 MED ORDER — SPIRONOLACTONE 50 MG PO TABS: 50.0000 mg | ORAL_TABLET | Freq: Two times a day (BID) | ORAL | 4 refills | Status: DC

## 2019-05-25 MED ORDER — IOHEXOL 300 MG/ML  SOLN
100.0000 mL | Freq: Once | INTRAMUSCULAR | Status: AC | PRN
  Administered 2019-05-25: 100 mL via INTRAVENOUS

## 2019-05-25 NOTE — Patient Instructions (Signed)

## 2019-05-25 NOTE — Progress Notes (Signed)
Hematology and Oncology Follow Up Visit  Beverly Davis [id number] Dec 25, 1940 79 y.o. 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 is here today for follow-up.  Unfortunately, I think what has situation now in which regard to have to think about transitioning her over to comfort care.  I really believe that her cirrhosis and consequent problems from cirrhosis will take her long before any cancer will.  She had her CAT scans done today.  The CAT scan showed that there was really no growth in her liver cancer.  She does have the cirrhosis.  She has a lot of varices.  Thankfully she is not bleeding.  She does have a large right pleural effusion.  She has ascites.  I talked her about getting a peritoneal drainage catheter placed.  I think this is reasonable.  This will help prevent her from having to have needles put in her all the time.  Hopefully will help prevent her from having the ascites go up to her right pleural space.  She also has a lot of swelling in her legs.  Again I am sure this is all from the ascites and her cirrhosis.  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  I will call in some spironolactone for her (50 mg p.o. twice daily) and we will see if that can help.  She still does not have the compression stockings for her legs.  I told her that the cirrhosis is good going to continue in it we will continue to weaken her and cause problems for her.  I told her that I  thought that her prognosis is going to be probably less than 6 months.  Her sister was with her and her sister was helps out.  [name] just is not ready for hospice yet.  I really think hospice would be a very good idea for her.  Unfortunately, I do still think she is at that point where hospice would be welcome by her.  I think her performance status is no better than ECOG 3.  I guess she is eating okay.  She is having no nausea or vomiting.  She is having some diarrhea.  I did talk to her about end-of-life issues.  She does not want to be kept alive on a machine.  I totally agree with this.  I told her that if she were to be put on a ventilator and kept alive, she would never come off as her body would be too weak for this to happen and it would be up to her family to turn the ventilator off.  She does not want this to happen.  As such, she is a DO NOT RESUSCITATE.    Medications:  Allergies as of 05/25/2019      Reactions   Metformin And Related Other (See Comments)   Unknown   Welchol [colesevelam Hcl] Other (See Comments)   Unknown      Medication List       Accurate as of May 25, 2019 12:48  PM. If you have any questions, ask your nurse or doctor.        STOP taking these medications   lidocaine-prilocaine cream Commonly known as: EMLA Stopped by: [name], MD   ondansetron 8 MG tablet Commonly known as: Zofran Stopped by: [name], MD   prochlorperazine 10 MG tablet Commonly known as: COMPAZINE Stopped by: [name], MD     TAKE these medications   cyanocobalamin 1000 MCG tablet Take 1,000 mcg by mouth daily.   furosemide 40 MG tablet Commonly known as: LASIX Take 0.5 tablets (20 mg total) by mouth daily.   Januvia 100 MG tablet Generic drug: sitaGLIPtin Take 100 mg by mouth daily.   lactulose 10 GM/15ML solution Commonly known as: CHRONULAC Take 30 mLs (20 g total) by mouth 3 (three) times daily.   levothyroxine 112 MCG  tablet Commonly known as: Synthroid Take 1 tablet daily on an empty stomach with only water for 30 minutes & no Antacid meds, Calcium or Magnesium for 4 hours & avoid Biotin   magnesium oxide 400 MG tablet Commonly known as: MAG-OX Take 400 mg by mouth daily.   meclizine 25 MG tablet Commonly known as: ANTIVERT 1/2-1 pill up to 3 times daily as needed dizziness.   metFORMIN 500 MG tablet Commonly known as: Glucophage Take 1 tablet with Breakfast & Lunch and 2 tablets with supper for Diabetes   nystatin 100000 UNIT/ML suspension Commonly known as: MYCOSTATIN Take 5 mLs (500,000 Units total) by mouth 4 (four) times daily as needed. Swish and swallow.   OneTouch Delica Plus [id number] Misc 300 Wafers by Does not apply route 3 (three) times daily before meals. Check blood sugar 3 x  /day   OneTouch Ultra test strip Generic drug: glucose blood Check blood Sugar 3 x  /day before Meals   spironolactone 50 MG tablet Commonly known as: Aldactone Take 1 tablet (50 mg total) by mouth 2 (two) times daily. Started by: [name], MD   Vitamin D3 125 MCG (5000 UT) Caps Take 5,000 Units by mouth daily.       Allergies:  Allergies  Allergen Reactions  . Metformin And Related Other (See Comments)    Unknown  . Welchol [Colesevelam Hcl] Other (See Comments)    Unknown    Past Medical History, Surgical history, Social history, and Family History were reviewed and updated.  Review of Systems: Review of Systems  Constitutional: Positive for malaise/fatigue.  HENT: Negative.   Eyes: Negative.   Respiratory: Positive for shortness of breath.   Cardiovascular: Positive for palpitations and leg swelling.  Gastrointestinal: Positive for abdominal pain and diarrhea.  Genitourinary: Negative.   Musculoskeletal: Negative.   Skin: Negative.   Neurological: Negative.   Endo/Heme/Allergies: Negative.   Psychiatric/Behavioral: Negative.      Physical Exam:  height is 5' 2"   (1.575 m) and weight is 139 lb (63 kg). Her temporal temperature is 97 F (36.1 C) (abnormal). Her blood pressure is 113/73 and her pulse is 72. Her respiration is 18 and oxygen saturation is 99%.   Wt Readings from Last 3 Encounters:  05/25/19 139 lb (63 kg)  05/25/19 139 lb (63 kg)  05/19/19 144 lb (65.3 kg)    Physical Exam Vitals reviewed.  Constitutional:      Comments: Overall, this is a weak appearing white female.  She is in a wheelchair.  She has abdominal distention from ascites.  She has 3+ edema in her legs from fluid  retention from her ascites and cirrhosis.  HENT:     Head: Normocephalic and atraumatic.  Eyes:     Pupils: Pupils are equal, round, and reactive to light.  Cardiovascular:     Rate and Rhythm: Normal rate and regular rhythm.     Heart sounds: Normal heart sounds.  Pulmonary:     Effort: Pulmonary effort is normal.     Breath sounds: Normal breath sounds.     Comments: Pulmonary exam shows decreased breath sounds on the right side.  Left lung sounds pretty good with good air movement. Abdominal:     General: Bowel sounds are normal.     Palpations: Abdomen is soft.     Comments: Abdominal exam is distended.  She has a fluid wave.  There is no guarding or rebound tenderness.  She has no obvious abdominal mass.  She has good bowel sounds which might be a little bit decreased.  It is hard to palpate her liver or spleen tip.  Musculoskeletal:        General: No tenderness or deformity. Normal range of motion.     Cervical back: Normal range of motion.     Comments: Extremities shows 3+ edema in her legs bilaterally.  This is pitting edema.  Lymphadenopathy:     Cervical: No cervical adenopathy.  Skin:    General: Skin is warm and dry.     Findings: No erythema or rash.  Neurological:     Mental Status: She is alert and oriented to person, place, and time.  Psychiatric:        Behavior: Behavior normal.        Thought Content: Thought content normal.         Judgment: Judgment normal.      Lab Results  Component Value Date   WBC 2.5 (L) 05/25/2019   HGB 9.4 (L) 05/25/2019   HCT 28.3 (L) 05/25/2019   MCV 108.0 (H) 05/25/2019   PLT 86 (L) 05/25/2019   Lab Results  Component Value Date   FERRITIN 84 03/17/2019   IRON 72 03/17/2019   TIBC 311 03/17/2019   UIBC 239 03/17/2019   IRONPCTSAT 23 03/17/2019   Lab Results  Component Value Date   RETICCTPCT 1.2 09/05/2015   RBC 2.62 (L) 05/25/2019   RETICCTABS 40,080 09/05/2015   No results found for: KPAFRELGTCHN, LAMBDASER, KAPLAMBRATIO No results found for: Kandis Cocking, [facility] Lab Results  Component Value Date   TOTALPROTELP 7.1 02/03/2008   ALBUMINELP 54.8 (L) 02/03/2008   A1GS 4.2 02/03/2008   A2GS 9.5 02/03/2008   BETS 8.1 (H) 02/03/2008   BETA2SER 5.8 02/03/2008   GAMS 17.6 02/03/2008   MSPIKE NOT DET 02/03/2008   SPEI * 02/03/2008     Chemistry      Component Value Date/Time   NA 137 05/25/2019 1040   NA 139 01/01/2017 1034   K 3.7 05/25/2019 1040   K 4.1 01/01/2017 1034   CL 106 05/25/2019 1040   CL 107 01/01/2017 1034   CO2 23 05/25/2019 1040   CO2 29 01/01/2017 1034   BUN 10 05/25/2019 1040   BUN 9 01/01/2017 1034   CREATININE 0.54 05/25/2019 1040   CREATININE 0.71 01/04/2019 1526      Component Value Date/Time   CALCIUM 8.7 (L) 05/25/2019 1040   CALCIUM 9.4 01/01/2017 1034   ALKPHOS 135 (H) 05/25/2019 1040   ALKPHOS 81 01/01/2017 1034   AST 56 (H) 05/25/2019 1040   ALT 32 05/25/2019 1040  ALT 48 (H) 01/01/2017 1034   BILITOT 2.5 (H) 05/25/2019 1040       Impression and Plan: Ms. Greis is a very pleasant 79yo caucasian female with 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  At this point, we are going to begin to focus on quality of life and comfort care.  I really think that it is going to her cirrhosis that we will take her long before any cancer will.  She said that she sees her gastroenterologist sometime in March.  I  am not sure what he will be able to do for her.  We will see if radiology can place a peritoneal catheter for drainage.  Hopefully this will decrease the amount of needlesticks that she needs.  She also needs to have a another thoracentesis.  I had to spend about 50 minutes with her today.  A lot of times, had to repeat what I said.  She quite a few times asked the same question.  I understand this.  She probably has a little bit of dementia.  Then we really want quality of life as are primary goal now.  I think that she will need hospice within a couple months.  We will see if the Aldactone might help her a little bit.  Thankfully, her sister was with her who understands everything well.  I am sure that Ms. Leaf will not remember a lot of what I told her.  I will have to talk to her son by phone and let him know what is going on.  We will have her come back in about 3 weeks to see how she is doing.    [name], MD 3/2/202112:48 PM

## 2019-05-26 LAB — AFP TUMOR MARKER: AFP, Serum, Tumor Marker: 244 ng/mL — ABNORMAL HIGH (ref 0.0–8.3)

## 2019-05-26 LAB — TSH: TSH: 2.139 u[IU]/mL (ref 0.308–3.960)

## 2019-05-27 ENCOUNTER — Telehealth: Payer: Self-pay | Admitting: Hematology & Oncology

## 2019-05-27 NOTE — Telephone Encounter (Signed)
Called and LMVM for patient with Date/Time/Location of Covid Test , Thoracentesis & Paracentesis . I asked that she call back to confirm these appointments

## 2019-05-27 NOTE — Telephone Encounter (Signed)
Called and spoke with [name] patients son and advised him of all appointments that I have scheduled for Covid  Thoracentesis & Paracentesis.  He was appreciative of this information

## 2019-05-28 ENCOUNTER — Ambulatory Visit (HOSPITAL_COMMUNITY): Payer: PPO

## 2019-05-31 ENCOUNTER — Ambulatory Visit (HOSPITAL_COMMUNITY): Payer: PPO

## 2019-05-31 ENCOUNTER — Other Ambulatory Visit (HOSPITAL_COMMUNITY)
Admission: RE | Admit: 2019-05-31 | Discharge: 2019-05-31 | Disposition: A | Discharge status: Home / Self Care | Payer: PPO | Source: Ambulatory Visit | Attending: Hematology & Oncology | Admitting: Hematology & Oncology

## 2019-05-31 DIAGNOSIS — Z01812 Encounter for preprocedural laboratory examination: Secondary | ICD-10-CM | POA: Diagnosis not present

## 2019-05-31 DIAGNOSIS — Z20822 Contact with and (suspected) exposure to covid-19: Secondary | ICD-10-CM | POA: Diagnosis not present

## 2019-05-31 LAB — SARS CORONAVIRUS 2 (TAT 6-24 HRS): SARS Coronavirus 2: NEGATIVE

## 2019-06-01 ENCOUNTER — Telehealth: Payer: Self-pay | Admitting: *Deleted

## 2019-06-01 NOTE — Telephone Encounter (Signed)
Call received from patient wanting to know if Dr. Marin Olp would like for her to take both Lasix and Aldactone.  Call placed back to patient to notify her per order of Dr. Marin Olp to continue both Lasix and Aldactone.  Pt appreciative of call back and has no further questions or concerns at this time.

## 2019-06-02 ENCOUNTER — Ambulatory Visit (HOSPITAL_COMMUNITY): Payer: PPO

## 2019-06-03 ENCOUNTER — Ambulatory Visit (HOSPITAL_COMMUNITY)
Admission: RE | Admit: 2019-06-03 | Discharge: 2019-06-03 | Disposition: A | Discharge status: Home / Self Care | Payer: PPO | Source: Ambulatory Visit | Attending: Hematology & Oncology | Admitting: Hematology & Oncology

## 2019-06-03 ENCOUNTER — Other Ambulatory Visit: Payer: Self-pay

## 2019-06-03 ENCOUNTER — Ambulatory Visit (HOSPITAL_COMMUNITY)
Admission: RE | Admit: 2019-06-03 | Discharge: 2019-06-03 | Disposition: A | Discharge status: Home / Self Care | Payer: PPO | Source: Ambulatory Visit | Attending: Student | Admitting: Student

## 2019-06-03 DIAGNOSIS — J90 Pleural effusion, not elsewhere classified: Secondary | ICD-10-CM | POA: Insufficient documentation

## 2019-06-03 DIAGNOSIS — Z9889 Other specified postprocedural states: Secondary | ICD-10-CM | POA: Diagnosis not present

## 2019-06-03 DIAGNOSIS — K7460 Unspecified cirrhosis of liver: Secondary | ICD-10-CM | POA: Diagnosis not present

## 2019-06-03 DIAGNOSIS — Z8505 Personal history of malignant neoplasm of liver: Secondary | ICD-10-CM | POA: Diagnosis not present

## 2019-06-03 DIAGNOSIS — C220 Liver cell carcinoma: Secondary | ICD-10-CM

## 2019-06-03 MED ORDER — LIDOCAINE HCL 1 % IJ SOLN
INTRAMUSCULAR | Status: AC
  Filled 2019-06-03: qty 20

## 2019-06-03 NOTE — Procedures (Addendum)
PROCEDURE SUMMARY:  Successful image-guided right thoracentesis. Yielded 900 milliliters of hazy gold fluid. Procedure was stopped after 900 mL due to patient coughing. Patient tolerated procedure well. No immediate complications. EBL = 0 mL.  Specimen was not sent for labs. CXR ordered.  Please see imaging section of Epic for full dictation.   Earley Abide PA-C 06/03/2019 2:32 PM

## 2019-06-10 ENCOUNTER — Other Ambulatory Visit: Payer: PPO

## 2019-06-10 ENCOUNTER — Other Ambulatory Visit: Payer: Self-pay

## 2019-06-10 ENCOUNTER — Ambulatory Visit (HOSPITAL_COMMUNITY)
Admission: RE | Admit: 2019-06-10 | Discharge: 2019-06-10 | Disposition: A | Discharge status: Home / Self Care | Payer: PPO | Source: Ambulatory Visit | Attending: Hematology & Oncology | Admitting: Hematology & Oncology

## 2019-06-10 ENCOUNTER — Ambulatory Visit: Payer: PPO | Admitting: Hematology & Oncology

## 2019-06-10 DIAGNOSIS — C220 Liver cell carcinoma: Secondary | ICD-10-CM | POA: Insufficient documentation

## 2019-06-10 DIAGNOSIS — R188 Other ascites: Secondary | ICD-10-CM | POA: Diagnosis not present

## 2019-06-10 MED ORDER — LIDOCAINE HCL 1 % IJ SOLN
INTRAMUSCULAR | Status: AC
  Filled 2019-06-10: qty 20

## 2019-06-10 NOTE — Progress Notes (Signed)
IR requested by Dr. Marin Olp for possible image-guided paracentesis.  Limited abdominal US revealed no fluid that could be safely accessed with procedure. Images sent to Dr. Vernard Gambles for review. Informed patient that procedure will not occur today. All questions answered and concerns addressed. Will make Dr. Marin Olp aware.  IR available in future if needed.   Bea Graff Sunni Richardson, PA-C 06/10/2019, 10:22 AM

## 2019-06-17 ENCOUNTER — Ambulatory Visit: Payer: PPO | Admitting: Hematology & Oncology

## 2019-06-17 ENCOUNTER — Other Ambulatory Visit: Payer: PPO

## 2019-06-21 ENCOUNTER — Telehealth: Payer: Self-pay | Admitting: Family

## 2019-06-21 ENCOUNTER — Other Ambulatory Visit: Payer: Self-pay

## 2019-06-21 ENCOUNTER — Inpatient Hospital Stay: Payer: PPO

## 2019-06-21 ENCOUNTER — Encounter: Payer: Self-pay | Admitting: Family

## 2019-06-21 ENCOUNTER — Inpatient Hospital Stay (HOSPITAL_BASED_OUTPATIENT_CLINIC_OR_DEPARTMENT_OTHER): Payer: PPO | Admitting: Family

## 2019-06-21 VITALS — BP 106/55 | HR 73 | Temp 97.5°F | Resp 18

## 2019-06-21 DIAGNOSIS — J90 Pleural effusion, not elsewhere classified: Secondary | ICD-10-CM | POA: Diagnosis not present

## 2019-06-21 DIAGNOSIS — K7682 Hepatic encephalopathy: Secondary | ICD-10-CM

## 2019-06-21 DIAGNOSIS — K7290 Hepatic failure, unspecified without coma: Secondary | ICD-10-CM | POA: Diagnosis not present

## 2019-06-21 DIAGNOSIS — C220 Liver cell carcinoma: Secondary | ICD-10-CM

## 2019-06-21 DIAGNOSIS — Z95828 Presence of other vascular implants and grafts: Secondary | ICD-10-CM

## 2019-06-21 LAB — CBC WITH DIFFERENTIAL (CANCER CENTER ONLY)
Abs Immature Granulocytes: 0.02 10*3/uL (ref 0.00–0.07)
Basophils Absolute: 0 10*3/uL (ref 0.0–0.1)
Basophils Relative: 2 %
Eosinophils Absolute: 0.1 10*3/uL (ref 0.0–0.5)
Eosinophils Relative: 3 %
HCT: 29.7 % — ABNORMAL LOW (ref 36.0–46.0)
Hemoglobin: 9.7 g/dL — ABNORMAL LOW (ref 12.0–15.0)
Immature Granulocytes: 1 %
Lymphocytes Relative: 18 %
Lymphs Abs: 0.5 10*3/uL — ABNORMAL LOW (ref 0.7–4.0)
MCH: 35.8 pg — ABNORMAL HIGH (ref 26.0–34.0)
MCHC: 32.7 g/dL (ref 30.0–36.0)
MCV: 109.6 fL — ABNORMAL HIGH (ref 80.0–100.0)
Monocytes Absolute: 0.3 10*3/uL (ref 0.1–1.0)
Monocytes Relative: 11 %
Neutro Abs: 1.8 10*3/uL (ref 1.7–7.7)
Neutrophils Relative %: 65 %
Platelet Count: 91 10*3/uL — ABNORMAL LOW (ref 150–400)
RBC: 2.71 MIL/uL — ABNORMAL LOW (ref 3.87–5.11)
RDW: 15.1 % (ref 11.5–15.5)
WBC Count: 2.7 10*3/uL — ABNORMAL LOW (ref 4.0–10.5)
nRBC: 0 % (ref 0.0–0.2)

## 2019-06-21 LAB — CMP (CANCER CENTER ONLY)
ALT: 35 U/L (ref 0–44)
AST: 53 U/L — ABNORMAL HIGH (ref 15–41)
Albumin: 2.9 g/dL — ABNORMAL LOW (ref 3.5–5.0)
Alkaline Phosphatase: 144 U/L — ABNORMAL HIGH (ref 38–126)
Anion gap: 6 (ref 5–15)
BUN: 17 mg/dL (ref 8–23)
CO2: 24 mmol/L (ref 22–32)
Calcium: 9.7 mg/dL (ref 8.9–10.3)
Chloride: 104 mmol/L (ref 98–111)
Creatinine: 0.77 mg/dL (ref 0.44–1.00)
GFR, Est AFR Am: >60 mL/min (ref >60)
GFR, Estimated: >60 mL/min (ref >60)
Glucose, Bld: 317 mg/dL — ABNORMAL HIGH (ref 70–99)
Potassium: 4.4 mmol/L (ref 3.5–5.1)
Sodium: 134 mmol/L — ABNORMAL LOW (ref 135–145)
Total Bilirubin: 1.4 mg/dL — ABNORMAL HIGH (ref 0.3–1.2)
Total Protein: 6.4 g/dL — ABNORMAL LOW (ref 6.5–8.1)

## 2019-06-21 LAB — AMMONIA: Ammonia: 108 umol/L — ABNORMAL HIGH (ref 9–35)

## 2019-06-21 MED ORDER — HEPARIN SOD (PORK) LOCK FLUSH 100 UNIT/ML IV SOLN
500.0000 [IU] | Freq: Once | INTRAVENOUS | Status: AC
  Administered 2019-06-21: 500 [IU] via INTRAVENOUS
  Filled 2019-06-21: qty 5

## 2019-06-21 MED ORDER — SODIUM CHLORIDE 0.9% FLUSH
10.0000 mL | INTRAVENOUS | Status: DC | PRN
  Filled 2019-06-21: qty 10

## 2019-06-21 MED ORDER — SODIUM CHLORIDE 0.9% FLUSH
10.0000 mL | Freq: Once | INTRAVENOUS | Status: AC
  Administered 2019-06-21: 10 mL via INTRAVENOUS
  Filled 2019-06-21: qty 10

## 2019-06-21 MED ORDER — HEPARIN SOD (PORK) LOCK FLUSH 100 UNIT/ML IV SOLN
500.0000 [IU] | Freq: Once | INTRAVENOUS | Status: DC
  Filled 2019-06-21: qty 5

## 2019-06-21 NOTE — Patient Instructions (Signed)

## 2019-06-21 NOTE — Progress Notes (Signed)
Hematology and Oncology Follow Up Visit  Beverly Davis [id number] [birth year] 79 y.o. 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 is here today with her sister for follow-up. She is now comfort care and doing fairly well. She is fatigued and weak at times. She has had bouts with dizziness/spinning of the room at random intervals that sound like possible vertigo. She does have poorly controlled diabetes as well as a chronically elevated ammonia level (108 today).  She has not taken her Lactulose in 2 days as she had company yesterday and had her appointment today. She was concerned with being able to make it to the bathroom.  She has not been taking her lasix because she has to to go to the bathroom often. I did advise that she take as prescribed and same with Lactulose.  She is pleasantly confused at times and usually easy to direct.  She had 900 ml of fluid removed vis thoracentesis on 06/03/2019. No paracentesis needed the following week.  We discussed at length her having a permanent catheter placed for draining of pleural fluid when needed and she is agreeable to this.  We also discussed hospice and how helpful they can be early on in her stages of comfort care. She verbalized understanding but is still not ready for a referral. She is ok with home health coming in to educate and assist with the drainage catheter.  No fever, chills, n/v, cough, rash,  SOB, chest pain, palpitations, abdominal pain or changes in bowel or bladder habits.  She has chronic swelling in her right lower extremity associated with the hitsory of thrombus and is doing well on Xarelto. She denies any episodes of bleeding. No bruising or petechiae noted.  She is currently using a cane when ambulating for added support and denies any falls or syncopal episodes.  She states that her appetite comes and goes. She is drink a glucerna daily and trying to hydrated.   ECOG Performance Status: 1 - Symptomatic but completely ambulatory  Medications:  Allergies as of 06/21/2019      Reactions   Metformin And Related Other (See Comments)   Unknown   Welchol [colesevelam Hcl] Other (See Comments)   Unknown      Medication List       Accurate as of June 21, 2019 11:05 AM. If you have any questions, ask your nurse or doctor.        cyanocobalamin 1000 MCG tablet Take 1,000 mcg by mouth daily.   furosemide 40 MG tablet Commonly known as: LASIX Take 0.5 tablets (20 mg total) by mouth daily.   Januvia 100 MG tablet Generic drug: sitaGLIPtin Take 100 mg by mouth daily.   lactulose 10 GM/15ML solution Commonly known as: CHRONULAC Take 30 mLs (20 g total) by mouth 3 (three) times daily.   levothyroxine 112 MCG tablet Commonly known as: Synthroid Take 1 tablet daily on an empty stomach with only water for 30 minutes & no Antacid meds, Calcium or Magnesium  for 4 hours & avoid Biotin   magnesium oxide 400 MG tablet Commonly known as: MAG-OX Take 400 mg by mouth daily.   meclizine 25 MG tablet Commonly known as: ANTIVERT 1/2-1 pill up to 3 times daily as needed dizziness.   metFORMIN 500 MG tablet Commonly known as: Glucophage Take 1 tablet with Breakfast & Lunch and 2 tablets with supper for Diabetes   nystatin 100000 UNIT/ML suspension Commonly known as: MYCOSTATIN Take 5 mLs (500,000 Units total) by mouth 4 (four) times daily as needed. Swish and swallow.     OneTouch Delica Plus [id number] Misc 300 Wafers by Does not apply route 3 (three) times daily before meals. Check blood sugar 3 x  /day   OneTouch Ultra test strip Generic drug: glucose blood Check blood Sugar 3 x  /day before Meals   spironolactone 50 MG tablet Commonly known as: Aldactone Take 1 tablet (50 mg total) by mouth 2 (two) times daily.   Vitamin D3 125 MCG (5000 UT) Caps Take 5,000 Units by mouth daily.       Allergies:  Allergies  Allergen Reactions   Metformin And Related Other (See Comments)    Unknown   Welchol [Colesevelam Hcl] Other (See Comments)    Unknown    Past Medical History, Surgical history, Social history, and Family History were reviewed and updated.  Review of Systems: All other 10 point review of systems is negative.   Physical Exam:  vitals were not taken for this visit.   Wt Readings from Last 3 Encounters:  05/25/19 139 lb (63 kg)  05/25/19 139 lb (63 kg)  05/19/19 144 lb (65.3 kg)    Ocular: Sclerae unicteric, pupils equal, round and reactive to light Ear-nose-throat: Oropharynx clear, dentition fair Lymphatic: No cervical or supraclavicular adenopathy Lungs no rales or rhonchi, good excursion bilaterally Heart regular rate and rhythm, no murmur appreciated Abd soft, nontender, positive bowel sounds, no liver or spleen tip palpated on exam, no fluid wave  MSK no focal spinal tenderness, no joint edema Neuro: non-focal, well-oriented, appropriate affect Breasts: Deferred   Lab Results  Component Value Date   WBC 2.7 (L) 06/21/2019   HGB 9.7 (L) 06/21/2019   HCT 29.7 (L) 06/21/2019   MCV 109.6 (H) 06/21/2019   PLT 91 (L) 06/21/2019   Lab Results  Component Value Date   FERRITIN 84 03/17/2019   IRON 72 03/17/2019   TIBC 311 03/17/2019   UIBC 239 03/17/2019   IRONPCTSAT 23 03/17/2019   Lab Results  Component Value Date   RETICCTPCT 1.2 09/05/2015   RBC 2.71 (L) 06/21/2019   RETICCTABS 40,080 09/05/2015   No  results found for: KPAFRELGTCHN, LAMBDASER, KAPLAMBRATIO No results found for: Kandis Cocking [facility] Lab Results  Component Value Date   TOTALPROTELP 7.1 02/03/2008   ALBUMINELP 54.8 (L) 02/03/2008   A1GS 4.2 02/03/2008   A2GS 9.5 02/03/2008   BETS 8.1 (H) 02/03/2008   BETA2SER 5.8 02/03/2008   GAMS 17.6 02/03/2008   MSPIKE NOT DET 02/03/2008   SPEI * 02/03/2008     Chemistry      Component Value Date/Time   NA 137 05/25/2019 1040   NA 139 01/01/2017 1034   K 3.7 05/25/2019 1040   K 4.1 01/01/2017 1034   CL 106 05/25/2019 1040   CL 107 01/01/2017 1034   CO2 23 05/25/2019 1040   CO2 29 01/01/2017 1034   BUN 10 05/25/2019 1040   BUN 9 01/01/2017 1034   CREATININE 0.54 05/25/2019  1040   CREATININE 0.71 01/04/2019 1526      Component Value Date/Time   CALCIUM 8.7 (L) 05/25/2019 1040   CALCIUM 9.4 01/01/2017 1034   ALKPHOS 135 (H) 05/25/2019 1040   ALKPHOS 81 01/01/2017 1034   AST 56 (H) 05/25/2019 1040   ALT 32 05/25/2019 1040   ALT 48 (H) 01/01/2017 1034   BILITOT 2.5 (H) 05/25/2019 1040       Impression and Plan: Ms. Wadley is a very pleasant 79yo caucasian female with 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  AFP on 05/25/2019 was 244.  She is now on comfort care and is agreeable to have a permanent drainage catheter placed for thoracentesis PRN. I have placed the order for the procedure as well as an order for home health education and assistance. Both she and her family will benefit from this.  As mentioned above she is still not ready for hospice to come in her home.  We will continue to follow along closely and plan to see her back in another month.  They will contact our office with any questions or concerns. We can certainly see her sooner if needed.  Greater than 50% of her 30-40 minute visit was spent counseling and coordinating care.   Laverna Peace, NP 3/29/202111:05 AM

## 2019-06-21 NOTE — Telephone Encounter (Signed)
Appointments scheduled avs/calendar printed per 3/29 los

## 2019-06-22 LAB — AFP TUMOR MARKER: AFP, Serum, Tumor Marker: 696 ng/mL — ABNORMAL HIGH (ref 0.0–8.3)

## 2019-06-23 DIAGNOSIS — K7460 Unspecified cirrhosis of liver: Secondary | ICD-10-CM | POA: Diagnosis not present

## 2019-06-23 DIAGNOSIS — K7290 Hepatic failure, unspecified without coma: Secondary | ICD-10-CM | POA: Diagnosis not present

## 2019-06-23 DIAGNOSIS — C229 Malignant neoplasm of liver, not specified as primary or secondary: Secondary | ICD-10-CM | POA: Diagnosis not present

## 2019-06-25 ENCOUNTER — Telehealth: Payer: Self-pay | Admitting: Hematology & Oncology

## 2019-06-25 NOTE — Telephone Encounter (Signed)
Patient called very confused about appointments for 4/8 and Plurex Catheter placement appointments.  I explained to her exactly where and what time her appointments were scheduled.  I also called and LMVM for her son [name] with appt date/time/location info.

## 2019-06-26 ENCOUNTER — Other Ambulatory Visit: Payer: Self-pay | Admitting: Family

## 2019-06-26 DIAGNOSIS — C220 Liver cell carcinoma: Secondary | ICD-10-CM

## 2019-06-26 DIAGNOSIS — Z95828 Presence of other vascular implants and grafts: Secondary | ICD-10-CM

## 2019-06-26 MED ORDER — LIDOCAINE-PRILOCAINE 2.5-2.5 % EX CREA: 1.0000 "application " | TOPICAL_CREAM | CUTANEOUS | 0 refills | Status: DC | PRN

## 2019-06-28 ENCOUNTER — Other Ambulatory Visit (HOSPITAL_COMMUNITY)
Admission: RE | Admit: 2019-06-28 | Discharge: 2019-06-28 | Disposition: A | Discharge status: Home / Self Care | Payer: PPO | Source: Ambulatory Visit | Attending: Hematology & Oncology | Admitting: Hematology & Oncology

## 2019-06-28 DIAGNOSIS — Z01812 Encounter for preprocedural laboratory examination: Secondary | ICD-10-CM | POA: Diagnosis not present

## 2019-06-28 DIAGNOSIS — Z20822 Contact with and (suspected) exposure to covid-19: Secondary | ICD-10-CM | POA: Insufficient documentation

## 2019-06-28 LAB — SARS CORONAVIRUS 2 (TAT 6-24 HRS): SARS Coronavirus 2: NEGATIVE

## 2019-06-29 ENCOUNTER — Other Ambulatory Visit: Payer: Self-pay | Admitting: Radiology

## 2019-07-01 ENCOUNTER — Other Ambulatory Visit: Payer: Self-pay | Admitting: Family

## 2019-07-01 ENCOUNTER — Ambulatory Visit (HOSPITAL_COMMUNITY)
Admission: RE | Admit: 2019-07-01 | Discharge: 2019-07-01 | Disposition: A | Discharge status: Home / Self Care | Payer: PPO | Source: Ambulatory Visit | Attending: Hematology & Oncology | Admitting: Hematology & Oncology

## 2019-07-01 ENCOUNTER — Other Ambulatory Visit: Payer: Self-pay

## 2019-07-01 ENCOUNTER — Ambulatory Visit (HOSPITAL_COMMUNITY)
Admission: RE | Admit: 2019-07-01 | Discharge: 2019-07-01 | Disposition: A | Discharge status: Home / Self Care | Payer: PPO | Source: Ambulatory Visit | Attending: Family | Admitting: Family

## 2019-07-01 ENCOUNTER — Encounter (HOSPITAL_COMMUNITY): Payer: Self-pay

## 2019-07-01 DIAGNOSIS — Z8709 Personal history of other diseases of the respiratory system: Secondary | ICD-10-CM | POA: Insufficient documentation

## 2019-07-01 DIAGNOSIS — J90 Pleural effusion, not elsewhere classified: Secondary | ICD-10-CM

## 2019-07-01 DIAGNOSIS — J948 Other specified pleural conditions: Secondary | ICD-10-CM | POA: Diagnosis not present

## 2019-07-01 DIAGNOSIS — C220 Liver cell carcinoma: Secondary | ICD-10-CM | POA: Insufficient documentation

## 2019-07-01 LAB — CBC WITH DIFFERENTIAL/PLATELET
Abs Immature Granulocytes: 0 10*3/uL (ref 0.00–0.07)
Basophils Absolute: 0 10*3/uL (ref 0.0–0.1)
Basophils Relative: 1 %
Eosinophils Absolute: 0.1 10*3/uL (ref 0.0–0.5)
Eosinophils Relative: 3 %
HCT: 28.5 % — ABNORMAL LOW (ref 36.0–46.0)
Hemoglobin: 9.2 g/dL — ABNORMAL LOW (ref 12.0–15.0)
Immature Granulocytes: 0 %
Lymphocytes Relative: 22 %
Lymphs Abs: 0.6 10*3/uL — ABNORMAL LOW (ref 0.7–4.0)
MCH: 36.4 pg — ABNORMAL HIGH (ref 26.0–34.0)
MCHC: 32.3 g/dL (ref 30.0–36.0)
MCV: 112.6 fL — ABNORMAL HIGH (ref 80.0–100.0)
Monocytes Absolute: 0.3 10*3/uL (ref 0.1–1.0)
Monocytes Relative: 9 %
Neutro Abs: 1.7 10*3/uL (ref 1.7–7.7)
Neutrophils Relative %: 65 %
Platelets: 92 10*3/uL — ABNORMAL LOW (ref 150–400)
RBC: 2.53 MIL/uL — ABNORMAL LOW (ref 3.87–5.11)
RDW: 15.6 % — ABNORMAL HIGH (ref 11.5–15.5)
WBC: 2.7 10*3/uL — ABNORMAL LOW (ref 4.0–10.5)
nRBC: 0 % (ref 0.0–0.2)

## 2019-07-01 LAB — BASIC METABOLIC PANEL
Anion gap: 5 (ref 5–15)
BUN: 16 mg/dL (ref 8–23)
CO2: 22 mmol/L (ref 22–32)
Calcium: 8.6 mg/dL — ABNORMAL LOW (ref 8.9–10.3)
Chloride: 112 mmol/L — ABNORMAL HIGH (ref 98–111)
Creatinine, Ser: 0.69 mg/dL (ref 0.44–1.00)
GFR calc Af Amer: >60 mL/min (ref >60)
GFR calc non Af Amer: >60 mL/min (ref >60)
Glucose, Bld: 136 mg/dL — ABNORMAL HIGH (ref 70–99)
Potassium: 3.7 mmol/L (ref 3.5–5.1)
Sodium: 139 mmol/L (ref 135–145)

## 2019-07-01 LAB — PROTIME-INR
INR: 1.3 — ABNORMAL HIGH (ref 0.8–1.2)
Prothrombin Time: 15.9 seconds — ABNORMAL HIGH (ref 11.4–15.2)

## 2019-07-01 MED ORDER — LIDOCAINE HCL 1 % IJ SOLN
INTRAMUSCULAR | Status: AC
  Filled 2019-07-01: qty 20

## 2019-07-01 MED ORDER — HEPARIN SOD (PORK) LOCK FLUSH 100 UNIT/ML IV SOLN
INTRAVENOUS | Status: AC
  Filled 2019-07-01: qty 5

## 2019-07-01 MED ORDER — CEFAZOLIN SODIUM-DEXTROSE 2-4 GM/100ML-% IV SOLN: 2.0000 g | INTRAVENOUS | Status: DC

## 2019-07-01 MED ORDER — HEPARIN SOD (PORK) LOCK FLUSH 100 UNIT/ML IV SOLN
500.0000 [IU] | INTRAVENOUS | Status: AC | PRN
  Administered 2019-07-01: 16:00:00 500 [IU]

## 2019-07-01 MED ORDER — FENTANYL CITRATE (PF) 100 MCG/2ML IJ SOLN
INTRAMUSCULAR | Status: AC
  Filled 2019-07-01: qty 2

## 2019-07-01 MED ORDER — MIDAZOLAM HCL 2 MG/2ML IJ SOLN
INTRAMUSCULAR | Status: AC
  Filled 2019-07-01: qty 4

## 2019-07-01 MED ORDER — CEFAZOLIN SODIUM-DEXTROSE 2-4 GM/100ML-% IV SOLN
INTRAVENOUS | Status: AC
  Filled 2019-07-01: qty 100

## 2019-07-01 MED ORDER — SODIUM CHLORIDE 0.9 % IV SOLN: INTRAVENOUS | Status: DC

## 2019-07-01 NOTE — Progress Notes (Signed)
Pt returned from radiology. Report given that pt did not have to have the procedure and no sedation given.

## 2019-07-01 NOTE — H&P (Signed)
Referring Physician(s): Ennever,P  Supervising Physician: Jacqulynn Cadet  Patient Status: [facility]  Chief Complaint: "I'm having a chest catheter put in"    Subjective: Patient familiar to IR service from bland embolization of a right hepatocellular carcinoma on 05/01/2016, microwave ablation of the right hepatocellular carcinoma in March 2018 and microwave ablation of a recurrence of the [facility] in 2019.  She also had a right chest wall Port-A-Cath placed on 09/28/2018 and right thoracentesis on 02/09/19.  She has a history of NASH cirrhosis, remote right breast cancer in 2008 and now with progressive hepatocellular carcinoma.  She was diagnosed with right lower extremity DVT on 03/11/2019, placed on Xarelto with subsequent development of melena/epistaxis and some bleeding from her gums.  She underwent IVC filter placement on 03/18/2019.  She has since undergone additional right thoracenteses, latest on 06/03/2019 yielding 900 cc.  She is now on comfort care and presents today for right Pleurx catheter placement for recurrent symptomatic right pleural effusion.  She currently denies fever, headache, chest pain, worsening dyspnea, cough, abdominal/back pain, nausea, vomiting or bleeding.  Past Medical History:  Diagnosis Date  . Breast cancer ([place]) 05/2006   Right breast cancer  . Cirrhosis ([place])   . Dementia ([place])   . Diabetes mellitus without complication ([place])    type 2  . Fibroids    PT IS UNAWARE   . Goals of care, counseling/discussion 09/17/2018  . Hepatocellular carcinoma ([place]) 05/08/2016  . Hypothyroidism   . Osteopenia   . Personal history of radiation therapy 2008  . Thrombocytopenia ([place])   . Thyroid disease    Past Surgical History:  Procedure Laterality Date  . ABDOMINAL HYSTERECTOMY     TAH BSO  . BREAST LUMPECTOMY Right 2008  . BREAST LUMPECTOMY WITH AXILLARY LYMPH NODE BIOPSY Right 06/25/2006  . CHOLECYSTECTOMY    . IR GENERIC HISTORICAL  03/28/2016   IR RADIOLOGIST  EVAL & MGMT 03/28/2016 Corrie Mckusick, DO [facility] INTER[facility]  . IR GENERIC HISTORICAL  05/01/2016   IR ANGIOGRAM VISCERAL SELECTIVE 05/01/2016 Corrie Mckusick, DO [facility]  . IR GENERIC HISTORICAL  05/01/2016   IR ANGIOGRAM VISCERAL SELECTIVE 05/01/2016 Corrie Mckusick, DO [facility]  . IR GENERIC HISTORICAL  05/01/2016   IR ANGIOGRAM SELECTIVE EACH ADDITIONAL VESSEL 05/01/2016 Corrie Mckusick, DO [facility]  . IR GENERIC HISTORICAL  05/01/2016   IR ANGIOGRAM SELECTIVE EACH ADDITIONAL VESSEL 05/01/2016 Corrie Mckusick, DO [facility]  . IR GENERIC HISTORICAL  05/01/2016   IR ANGIOGRAM SELECTIVE EACH ADDITIONAL VESSEL 05/01/2016 Corrie Mckusick, DO [facility]  . IR GENERIC HISTORICAL  05/01/2016   IR EMBO TUMOR ORGAN ISCHEMIA INFARCT INC GUIDE ROADMAPPING 05/01/2016 Corrie Mckusick, DO [facility]  . IR GENERIC HISTORICAL  05/01/2016   IR CT SPINE LTD 05/01/2016 Corrie Mckusick, DO [facility]  . IR GENERIC HISTORICAL  05/01/2016   IR US GUIDE VASC ACCESS RIGHT 05/01/2016 Corrie Mckusick, DO [facility]  . IR GENERIC HISTORICAL  05/01/2016   IR ANGIOGRAM SELECTIVE EACH ADDITIONAL VESSEL 05/01/2016 Corrie Mckusick, DO [facility]  . IR IMAGING GUIDED PORT INSERTION  09/28/2018  . IR IVC FILTER PLMT / S&I /IMG GUID/MOD SED  03/18/2019  . IR RADIOLOGIST EVAL & MGMT  07/31/2016  . IR RADIOLOGIST EVAL & MGMT  12/25/2016  . IR RADIOLOGIST EVAL & MGMT  12/10/2017  . IR RADIOLOGIST EVAL & MGMT  04/21/2018  . IR RADIOLOGIST EVAL & MGMT  08/14/2018  . IR THORACENTESIS ASP PLEURAL SPACE W/IMG  GUIDE  04/08/2019  . RADIOFREQUENCY ABLATION N/A 06/14/2016   Procedure: LIVER MICROWAVE THERMAL  ABLATION;  Surgeon: Corrie Mckusick, DO;  Location: [facility];  Service: Anesthesiology;  Laterality: N/A;  . RADIOLOGY WITH ANESTHESIA Right 01/21/2018   Procedure: CT WITH ANESTHESIA/MICROWAVE THERMAL ABLATION OF LIVER;  Surgeon: Corrie Mckusick, DO;  Location: [facility];  Service: Radiology;  Laterality: Right;  . TUBAL LIGATION  1978      Allergies: Metformin and  related and Welchol [colesevelam hcl]  Medications: Prior to Admission medications   Medication Sig Start Date End Date Taking? Authorizing Provider  Cholecalciferol (VITAMIN D3) 5000 units CAPS Take 5,000 Units by mouth daily.   Yes [provider]  cyanocobalamin 1000 MCG tablet Take 1,000 mcg by mouth daily.   Yes [provider]  furosemide (LASIX) 40 MG tablet Take 0.5 tablets (20 mg total) by mouth daily. 04/14/19  Yes [place], Holli Humbles, NP  glucose blood (ONETOUCH ULTRA) test strip Check blood Sugar 3 x  /day before Meals 01/09/19  Yes Unk Pinto, MD  lactulose (CHRONULAC) 10 GM/15ML solution Take 30 mLs (20 g total) by mouth 3 (three) times daily. 04/03/16  Yes [place]ll[place], Loma Sousa, PA-C  Lancets (ONETOUCH DELICA PLUS [id number]) MISC 300 Wafers by Does not apply route 3 (three) times daily before meals. Check blood sugar 3 x  /day 01/09/19  Yes Unk Pinto, MD  levothyroxine (SYNTHROID) 112 MCG tablet Take 1 tablet daily on an empty stomach with only water for 30 minutes & no Antacid meds, Calcium or Magnesium for 4 hours & avoid Biotin 12/02/18  Yes Unk Pinto, MD  magnesium oxide (MAG-OX) 400 MG tablet Take 400 mg by mouth daily.    Yes [provider]  metFORMIN (GLUCOPHAGE) 500 MG tablet Take 1 tablet with Breakfast & Lunch and 2 tablets with supper for Diabetes 09/10/18  Yes Unk Pinto, MD  nystatin (MYCOSTATIN) 100000 UNIT/ML suspension Take 5 mLs (500,000 Units total) by mouth 4 (four) times daily as needed. Swish and swallow. 05/05/19  Yes [place], Holli Humbles, NP  sitaGLIPtin (JANUVIA) 100 MG tablet Take 100 mg by mouth daily.   Yes [provider]  spironolactone (ALDACTONE) 50 MG tablet Take 1 tablet (50 mg total) by mouth 2 (two) times daily. 05/25/19  Yes [name], MD  lidocaine-prilocaine (EMLA) cream Apply 1 application topically as needed. Apply to port  30 minutes before lab or port flush appointment. 06/26/19    [place], Holli Humbles, NP  meclizine (ANTIVERT) 25 MG tablet 1/2-1 pill up to 3 times daily as needed dizziness. 02/24/19   Liane Comber, NP  prochlorperazine (COMPAZINE) 10 MG tablet Take 1 tablet (10 mg total) by mouth every 6 (six) hours as needed (Nausea or vomiting). 09/29/18 05/25/19  [name], MD     Vital Signs: BP (!) 117/51   Pulse 61   Temp 98 F (36.7 C) (Oral)   Resp 16   SpO2 100%   Physical Exam awake, alert.  Chest with some occasional inspiratory wheezing, slightly diminished at right base, left clear.  Clean, intact right chest wall Port-A-Cath.  Heart with regular rate and rhythm.  Abdomen soft, positive bowel sounds, nontender.  Bilateral lower extremity edema noted  Imaging: No results found.  Labs:  CBC: Recent Labs    04/14/19 1100 05/05/19 1045 05/25/19 1040 06/21/19 1052  WBC 3.0* 2.8* 2.5* 2.7*  HGB 9.7* 9.8* 9.4* 9.7*  HCT 29.5* 29.4* 28.3* 29.7*  PLT 85* 80* 86* 91*  COAGS: Recent Labs    07/29/18 1116 09/28/18 1100 03/15/19 1601 03/18/19 0810  INR 1.2* 1.2 4.0* 2.2*    BMP: Recent Labs    04/14/19 1100 05/05/19 1045 05/25/19 1040 06/21/19 1052  NA 136 139 137 134*  K 3.3* 3.0* 3.7 4.4  CL 101 105 106 104  CO2 23 22 23 24   GLUCOSE 342* 181* 125* 317*  BUN 13 10 10 17   CALCIUM 8.7* 9.1 8.7* 9.7  CREATININE 0.63 0.64 0.54 0.77  GFRNONAA >60 >60 >60 >60  GFRAA >60 >60 >60 >60    LIVER FUNCTION TESTS: Recent Labs    04/14/19 1100 05/05/19 1045 05/25/19 1040 06/21/19 1052  BILITOT 2.5* 2.5* 2.5* 1.4*  AST 67* 60* 56* 53*  ALT 39 35 32 35  ALKPHOS 135* 139* 135* 144*  PROT 6.0* 5.7* 6.1* 6.4*  ALBUMIN 2.7* 2.6* 2.7* 2.9*    Assessment and Plan: 55 to female with hx bland embolization of a right hepatocellular carcinoma on 05/01/2016, microwave ablation of the right hepatocellular carcinoma in March 2018 and microwave ablation of a recurrence of the [facility] in 2019.  She also had a right chest wall Port-A-Cath placed  on 09/28/2018 and right thoracentesis on 02/09/19.  She has a history of NASH cirrhosis, remote right breast cancer in 2008 and now with progressive hepatocellular carcinoma.  She was diagnosed with right lower extremity DVT on 03/11/2019, placed on Xarelto with subsequent development of melena/epistaxis and some bleeding from her gums.  She underwent IVC filter placement on 03/18/2019.  She has since undergone additional right thoracenteses, latest on 06/03/2019 yielding 900 cc.  She is now on comfort care and presents today for right Pleurx catheter placement for recurrent symptomatic right pleural effusion.  Details/risks of procedure, including but not limited to, internal bleeding, infection, injury to adjacent structures, pneumothorax, inability to place drain due to minimal fluid discussed with patient with her understanding and consent.   Electronically Signed: D. Rowe Robert, PA-C 07/01/2019, 1:35 PM   I spent a total of 25 minutes at the the patient's bedside AND on the patient's hospital floor or unit, greater than 50% of which was counseling/coordinating care for right Pleurx catheter placement

## 2019-07-02 ENCOUNTER — Telehealth: Payer: Self-pay | Admitting: *Deleted

## 2019-07-02 LAB — GLUCOSE, CAPILLARY: Glucose-Capillary: 130 mg/dL — ABNORMAL HIGH (ref 70–99)

## 2019-07-02 NOTE — Telephone Encounter (Signed)
Call received from patient to inform Dr. Marin Olp that pleurx catheter was not able to be placed d/t US showed little fluid.  Dr. Marin Olp notified.

## 2019-07-12 ENCOUNTER — Telehealth: Payer: Self-pay | Admitting: Internal Medicine

## 2019-07-12 NOTE — Telephone Encounter (Signed)
Januvia samples available and up front for pick up, patient notified.

## 2019-07-12 NOTE — Telephone Encounter (Signed)
requesting januvia samples.

## 2019-07-20 ENCOUNTER — Inpatient Hospital Stay: Payer: PPO

## 2019-07-20 ENCOUNTER — Other Ambulatory Visit: Payer: Self-pay

## 2019-07-20 ENCOUNTER — Inpatient Hospital Stay (HOSPITAL_BASED_OUTPATIENT_CLINIC_OR_DEPARTMENT_OTHER): Payer: PPO | Admitting: Family

## 2019-07-20 ENCOUNTER — Encounter: Payer: Self-pay | Admitting: Family

## 2019-07-20 ENCOUNTER — Inpatient Hospital Stay: Payer: PPO | Attending: Hematology & Oncology

## 2019-07-20 VITALS — BP 123/53 | HR 70 | Temp 97.5°F | Resp 16 | Ht 62.0 in | Wt 125.0 lb

## 2019-07-20 DIAGNOSIS — Z7984 Long term (current) use of oral hypoglycemic drugs: Secondary | ICD-10-CM | POA: Diagnosis not present

## 2019-07-20 DIAGNOSIS — K7682 Hepatic encephalopathy: Secondary | ICD-10-CM

## 2019-07-20 DIAGNOSIS — Z79899 Other long term (current) drug therapy: Secondary | ICD-10-CM | POA: Diagnosis not present

## 2019-07-20 DIAGNOSIS — Z86718 Personal history of other venous thrombosis and embolism: Secondary | ICD-10-CM | POA: Diagnosis not present

## 2019-07-20 DIAGNOSIS — C220 Liver cell carcinoma: Secondary | ICD-10-CM

## 2019-07-20 DIAGNOSIS — K7460 Unspecified cirrhosis of liver: Secondary | ICD-10-CM | POA: Diagnosis not present

## 2019-07-20 DIAGNOSIS — R634 Abnormal weight loss: Secondary | ICD-10-CM | POA: Insufficient documentation

## 2019-07-20 DIAGNOSIS — Z853 Personal history of malignant neoplasm of breast: Secondary | ICD-10-CM | POA: Diagnosis not present

## 2019-07-20 DIAGNOSIS — J90 Pleural effusion, not elsewhere classified: Secondary | ICD-10-CM

## 2019-07-20 DIAGNOSIS — K7581 Nonalcoholic steatohepatitis (NASH): Secondary | ICD-10-CM | POA: Diagnosis not present

## 2019-07-20 DIAGNOSIS — K7290 Hepatic failure, unspecified without coma: Secondary | ICD-10-CM

## 2019-07-20 DIAGNOSIS — R600 Localized edema: Secondary | ICD-10-CM | POA: Diagnosis not present

## 2019-07-20 LAB — CBC WITH DIFFERENTIAL (CANCER CENTER ONLY)
Abs Immature Granulocytes: 0.01 10*3/uL (ref 0.00–0.07)
Basophils Absolute: 0 10*3/uL (ref 0.0–0.1)
Basophils Relative: 1 %
Eosinophils Absolute: 0.1 10*3/uL (ref 0.0–0.5)
Eosinophils Relative: 2 %
HCT: 27.7 % — ABNORMAL LOW (ref 36.0–46.0)
Hemoglobin: 9.3 g/dL — ABNORMAL LOW (ref 12.0–15.0)
Immature Granulocytes: 0 %
Lymphocytes Relative: 21 %
Lymphs Abs: 0.6 10*3/uL — ABNORMAL LOW (ref 0.7–4.0)
MCH: 36.8 pg — ABNORMAL HIGH (ref 26.0–34.0)
MCHC: 33.6 g/dL (ref 30.0–36.0)
MCV: 109.5 fL — ABNORMAL HIGH (ref 80.0–100.0)
Monocytes Absolute: 0.3 10*3/uL (ref 0.1–1.0)
Monocytes Relative: 12 %
Neutro Abs: 1.7 10*3/uL (ref 1.7–7.7)
Neutrophils Relative %: 64 %
Platelet Count: 106 10*3/uL — ABNORMAL LOW (ref 150–400)
RBC: 2.53 MIL/uL — ABNORMAL LOW (ref 3.87–5.11)
RDW: 14.9 % (ref 11.5–15.5)
WBC Count: 2.7 10*3/uL — ABNORMAL LOW (ref 4.0–10.5)
nRBC: 0 % (ref 0.0–0.2)

## 2019-07-20 LAB — CMP (CANCER CENTER ONLY)
ALT: 36 U/L (ref 0–44)
AST: 56 U/L — ABNORMAL HIGH (ref 15–41)
Albumin: 3 g/dL — ABNORMAL LOW (ref 3.5–5.0)
Alkaline Phosphatase: 141 U/L — ABNORMAL HIGH (ref 38–126)
Anion gap: 6 (ref 5–15)
BUN: 15 mg/dL (ref 8–23)
CO2: 22 mmol/L (ref 22–32)
Calcium: 9.5 mg/dL (ref 8.9–10.3)
Chloride: 108 mmol/L (ref 98–111)
Creatinine: 0.66 mg/dL (ref 0.44–1.00)
GFR, Est AFR Am: >60 mL/min (ref >60)
GFR, Estimated: >60 mL/min (ref >60)
Glucose, Bld: 150 mg/dL — ABNORMAL HIGH (ref 70–99)
Potassium: 4.1 mmol/L (ref 3.5–5.1)
Sodium: 136 mmol/L (ref 135–145)
Total Bilirubin: 1.2 mg/dL (ref 0.3–1.2)
Total Protein: 6.3 g/dL — ABNORMAL LOW (ref 6.5–8.1)

## 2019-07-20 LAB — AMMONIA: Ammonia: 102 umol/L — ABNORMAL HIGH (ref 9–35)

## 2019-07-20 NOTE — Progress Notes (Signed)
Hematology and Oncology Follow Up Visit  Beverly Davis [id number] November 04, 1940 79 y.o. 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 Her weight continues to go down (14 lbs since last visit) but she states that she is doing well.  She denies fatigue at this time.  She states that her appetite has improved and she is staying well hydrated. She has some discomfort in the left and right lower back flanks that comes and goes.  AFP last month was up to 696. Ammonia level is 102.  She went on 4/8 for [place] but did not have enough pleural fluid to prompt placing the drain.  No fever, chills, n/v, cough, rash, dizziness, SOB, chest pain, palpitations, abdominal pain/bloating or changes in bowel or bladder habits.  No tenderness, numbness or tingling in her extremities.  The chronic swelling in both ankles is stable with it being a little more prominent on the right.  Pedal pulses are 2+.  No falls or syncopal episodes to report.   ECOG Performance Status: 1 - Symptomatic but completely ambulatory  Medications:  Allergies as of 07/20/2019      Reactions   Metformin And Related Other (See Comments)   Unknown   Welchol [colesevelam Hcl] Other (See Comments)   Unknown      Medication List       Accurate as of July 20, 2019  1:35 PM. If you have any questions, ask your nurse or doctor.          cyanocobalamin 1000 MCG tablet Take 1,000 mcg by mouth daily.   furosemide 40 MG tablet Commonly known as: LASIX Take 0.5 tablets (20 mg total) by mouth daily.   Januvia 100 MG tablet Generic drug: sitaGLIPtin Take 100 mg by mouth daily.   lactulose 10 GM/15ML solution Commonly known as: CHRONULAC Take 30 mLs (20 g total) by mouth 3 (three) times daily.   levothyroxine 112 MCG tablet Commonly known as: Synthroid Take 1 tablet daily on an empty stomach with only water for 30 minutes & no Antacid meds, Calcium or Magnesium for 4 hours & avoid Biotin   lidocaine-prilocaine cream Commonly known as: EMLA Apply 1 application topically as needed. Apply to port  30 minutes before lab or port flush appointment.   magnesium oxide 400 MG tablet Commonly known as: MAG-OX Take 400 mg by mouth daily.   meclizine 25 MG tablet Commonly known as: ANTIVERT 1/2-1 pill up to 3 times daily as needed dizziness.   metFORMIN 500 MG tablet Commonly known as: Glucophage Take 1 tablet with Breakfast & Lunch and 2 tablets with supper for Diabetes   nystatin 100000 UNIT/ML suspension Commonly known as: MYCOSTATIN Take 5 mLs (500,000 Units total) by mouth 4 (four) times daily as needed. Swish and swallow.   OneTouch Delica Plus [id number] Misc 300 Wafers by Does not apply route 3 (three) times daily before meals. Check blood sugar 3 x  /day   OneTouch Ultra test strip Generic drug: glucose blood Check  blood Sugar 3 x  /day before Meals   spironolactone 50 MG tablet Commonly known as: Aldactone Take 1 tablet (50 mg total) by mouth 2 (two) times daily.   Vitamin D3 125 MCG (5000 UT) Caps Take 5,000 Units by mouth daily.       Allergies:  Allergies  Allergen Reactions  . Metformin And Related Other (See Comments)    Unknown  . Welchol [Colesevelam Hcl] Other (See Comments)    Unknown    Past Medical History, Surgical history, Social history, and Family History were reviewed and  updated.  Review of Systems: All other 10 point review of systems is negative.   Physical Exam:  vitals were not taken for this visit.   Wt Readings from Last 3 Encounters:  07/20/19 125 lb (56.7 kg)  05/25/19 139 lb (63 kg)  05/25/19 139 lb (63 kg)    Ocular: Sclerae unicteric, pupils equal, round and reactive to light Ear-nose-throat: Oropharynx clear, dentition fair Lymphatic: No cervical or supraclavicular adenopathy Lungs no rales or rhonchi, good excursion bilaterally Heart regular rate and rhythm, no murmur appreciated Abd soft, nontender, positive bowel sounds, no liver or spleen tip palpated on exam, no fluid wave  MSK no focal spinal tenderness, no joint edema Neuro: non-focal, well-oriented, appropriate affect Breasts: Deferred   Lab Results  Component Value Date   WBC 2.7 (L) 07/01/2019   HGB 9.2 (L) 07/01/2019   HCT 28.5 (L) 07/01/2019   MCV 112.6 (H) 07/01/2019   PLT 92 (L) 07/01/2019   Lab Results  Component Value Date   FERRITIN 84 03/17/2019   IRON 72 03/17/2019   TIBC 311 03/17/2019   UIBC 239 03/17/2019   IRONPCTSAT 23 03/17/2019   Lab Results  Component Value Date   RETICCTPCT 1.2 09/05/2015   RBC 2.53 (L) 07/01/2019   RETICCTABS 40,080 09/05/2015   No results found for: KPAFRELGTCHN, LAMBDASER, KAPLAMBRATIO No results found for: Kandis Cocking, [facility] Lab Results  Component Value Date   TOTALPROTELP 7.1 02/03/2008   ALBUMINELP 54.8 (L) 02/03/2008   A1GS 4.2 02/03/2008   A2GS 9.5 02/03/2008   BETS 8.1 (H) 02/03/2008   BETA2SER 5.8 02/03/2008   GAMS 17.6 02/03/2008   MSPIKE NOT DET 02/03/2008   SPEI * 02/03/2008     Chemistry      Component Value Date/Time   NA 139 07/01/2019 1302   NA 139 01/01/2017 1034   K 3.7 07/01/2019 1302   K 4.1 01/01/2017 1034   CL 112 (H) 07/01/2019 1302   CL 107 01/01/2017 1034   CO2 22 07/01/2019 1302   CO2 29 01/01/2017 1034   BUN 16 07/01/2019 1302   BUN 9 01/01/2017 1034   CREATININE 0.69  07/01/2019 1302   CREATININE 0.77 06/21/2019 1052   CREATININE 0.71 01/04/2019 1526      Component Value Date/Time   CALCIUM 8.6 (L) 07/01/2019 1302   CALCIUM 9.4 01/01/2017 1034   ALKPHOS 144 (H) 06/21/2019 1052   ALKPHOS 81 01/01/2017 1034   AST 53 (H) 06/21/2019 1052   ALT 35 06/21/2019 1052   ALT 48 (H) 01/01/2017 1034   BILITOT 1.4 (H) 06/21/2019 1052       Impression and Plan: Beverly Davis is a very pleasant 79yo caucasian female with 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  She is now on comfort care and seems to be doing fairly well.  She is still not ready for hospice to come in her home.  We will plan to  see her again in another 6 weeks for follow-up and port flush.  We will continue to follow along closely and plan to see her back in another month.  They will contact our office with any questions or concerns. We can certainly see her sooner if needed.   Laverna Peace, NP 4/27/20211:35 PM

## 2019-07-21 LAB — AFP TUMOR MARKER: AFP, Serum, Tumor Marker: 619 ng/mL — ABNORMAL HIGH (ref 0.0–8.3)

## 2019-07-26 ENCOUNTER — Telehealth: Payer: Self-pay | Admitting: *Deleted

## 2019-07-26 NOTE — Telephone Encounter (Signed)
Patient called and requested Januvia 100 mg samples, but none is available, at this time. Per Liane Comber, NP, the patient was advised to leave off the [place], keep a record of her blood sugars and call back if the level stays above 150 consistently. Per A. Corbett, a less expensive RX can be sent in, if needed.  Patient is aware and expressed understanding.

## 2019-09-01 ENCOUNTER — Encounter: Payer: Self-pay | Admitting: Internal Medicine

## 2019-09-01 NOTE — Patient Instructions (Signed)

## 2019-09-01 NOTE — Progress Notes (Signed)
Annual Screening/Preventative Visit & Comprehensive Evaluation &  Examination     This very nice 79 y.o. MWF  presents for a Screening /Preventative Visit & comprehensive evaluation and management of multiple medical co-morbidities.  Patient has been followed for HTN, HLD, T2_NIDDM  and Vitamin D Deficiency.     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 [[She was treated with embolization 05/01/2016  and subsequent ablation to liver lesions 06/14/2016. In Oct 2019, she was treated with CT Ablation by microwave to recurrence in the Rt hepatic lobe.]] She's also had hx/o RLE DVT with intolerance to Xarelto and had IVC filter placed (Dec 2020).       Patient has been followed for labile HTN.  Patient's BP has been controlled at home and patient denies any cardiac symptoms as chest pain, palpitations, shortness of breath, dizziness or ankle swelling. Today's BP is at goal  -  108/60.      Patient's hyperlipidemia is controlled with diet . Last lipids were at goal:  Lab Results  Component Value Date   CHOL 151 03/24/2019   HDL 38 (L) 03/24/2019   LDLCALC 93 03/24/2019   TRIG 99 03/24/2019   CHOLHDL 4.0 03/24/2019       Patient has hx/o T2_NIDDM  Circa 2007 and patient denies reactive hypoglycemic symptoms, visual blurring, diabetic polys or paresthesias. Last A1c was Normal & at goal: Lab Results  Component Value Date   HGBA1C 4.9 03/24/2019       Patient has been on thyroid replacement since 2000. Finally, patient has history of Vitamin D Deficiency ("35" / 2015) and last Vitamin D was at goal:  Lab Results  Component Value Date   VD25OH 76 07/29/2018    Current Outpatient Medications on File Prior to Visit  Medication Sig   Cholecalciferol (VITAMIN D3) 5000 units CAPS Take 5,000 Units by mouth daily.   cyanocobalamin 1000 MCG tablet Take 1,000  mcg by mouth daily.   glucose blood (ONETOUCH ULTRA) test strip Check blood Sugar 3 x  /day before Meals   lactulose (CHRONULAC) 10 GM/15ML solution Take 30 mLs (20 g total) by mouth 3 (three) times daily.   Lancets (ONETOUCH DELICA PLUS [id number]) MISC 300 Wafers by Does not apply route 3 (three) times daily before meals. Check blood sugar 3 x  /day   levothyroxine (SYNTHROID) 112 MCG tablet Take 1 tablet daily on an empty stomach with only water for 30 minutes & no Antacid meds, Calcium or Magnesium for 4 hours & avoid Biotin   lidocaine-prilocaine (EMLA) cream Apply 1 application topically as needed. Apply to port  30 minutes before lab or port flush appointment.   magnesium oxide (MAG-OX) 400 MG tablet Take 400 mg by mouth daily.    meclizine (ANTIVERT) 25 MG tablet 1/2-1 pill up to 3 times daily as needed dizziness.   metFORMIN (GLUCOPHAGE) 500 MG tablet Take 1 tablet with Breakfast & Lunch and 2 tablets with supper for Diabetes   nystatin (MYCOSTATIN) 100000 UNIT/ML suspension Take 5 mLs (500,000 Units total) by mouth 4 (four) times daily as needed. Swish and swallow.   sitaGLIPtin (JANUVIA) 100 MG tablet Take 100 mg by mouth daily.   spironolactone (ALDACTONE) 50 MG tablet Take 1 tablet (50 mg total) by mouth 2 (two) times daily.   furosemide (LASIX) 40 MG tablet Take 0.5 tablets (20 mg total)  by mouth daily. (Patient not taking: Reported on 09/02/2019)   [DISCONTINUED] prochlorperazine (COMPAZINE) 10 MG tablet Take 1 tablet (10 mg total) by mouth every 6 (six) hours as needed (Nausea or vomiting).   No current facility-administered medications on file prior to visit.   Allergies  Allergen Reactions   Metformin And Related Other (See Comments)    Unknown Reaction   Welchol [Colesevelam Hcl] Other (See Comments)    Unknown Reaction   Past Medical History:  Diagnosis Date   Breast cancer ([place]) 05/2006   Right breast cancer   Cirrhosis ([place])    Dementia ([place])     Diabetes mellitus without complication ([place])    type 2   Fibroids    PT IS UNAWARE    Goals of care, counseling/discussion 09/17/2018   Hepatocellular carcinoma ([place]) 05/08/2016   Hypothyroidism    Osteopenia    Personal history of radiation therapy 2008   Thrombocytopenia ([place])    Thyroid disease    Health Maintenance  Topic Date Due   COVID-19 Vaccine (1) Never done   FOOT EXAM  07/29/2019   URINE MICROALBUMIN  07/29/2019   HEMOGLOBIN A1C  09/22/2019   INFLUENZA VACCINE  10/24/2019   OPHTHALMOLOGY EXAM  11/25/2019   TETANUS/TDAP  06/11/2022   DEXA SCAN  Completed   Hepatitis C Screening  Completed   PNA vac Low Risk Adult  Completed   Immunization History  Administered Date(s) Administered   Influenza Split 03/06/2011, 01/06/2013   Influenza, High Dose Seasonal PF 01/12/2014, 12/14/2014, 12/27/2015, 01/22/2017, 12/17/2017, 01/14/2019   Pneumococcal Conjugate-13 01/12/2014   Pneumococcal Polysaccharide-23 08/30/2014   Tdap 06/10/2012    Last Colon - 06/20/2004  Last MGM- f/u  scheduled 09/28/2019   Past Surgical History:  Procedure Laterality Date   ABDOMINAL HYSTERECTOMY     TAH BSO   BREAST LUMPECTOMY Right 2008   BREAST LUMPECTOMY WITH AXILLARY LYMPH NODE BIOPSY Right 06/25/2006   CHOLECYSTECTOMY     IR GENERIC HISTORICAL  03/28/2016   IR RADIOLOGIST EVAL & MGMT 03/28/2016 Corrie Mckusick, DO [facility]   IR GENERIC HISTORICAL  05/01/2016   IR ANGIOGRAM VISCERAL SELECTIVE 05/01/2016 Corrie Mckusick, DO [facility]   IR GENERIC HISTORICAL  05/01/2016   IR ANGIOGRAM VISCERAL SELECTIVE 05/01/2016 Corrie Mckusick, DO [facility]   IR GENERIC HISTORICAL  05/01/2016   IR ANGIOGRAM SELECTIVE EACH ADDITIONAL VESSEL 05/01/2016 Corrie Mckusick, DO [facility]   IR GENERIC HISTORICAL  05/01/2016   IR ANGIOGRAM SELECTIVE EACH ADDITIONAL VESSEL 05/01/2016 Corrie Mckusick, DO [facility]   IR GENERIC HISTORICAL  05/01/2016   IR ANGIOGRAM SELECTIVE EACH ADDITIONAL  VESSEL 05/01/2016 Corrie Mckusick, DO [facility]   IR GENERIC HISTORICAL  05/01/2016   IR EMBO TUMOR ORGAN ISCHEMIA INFARCT INC GUIDE ROADMAPPING 05/01/2016 Corrie Mckusick, DO [facility]   IR GENERIC HISTORICAL  05/01/2016   IR CT SPINE LTD 05/01/2016 Corrie Mckusick, DO [facility]   IR GENERIC HISTORICAL  05/01/2016   IR US GUIDE VASC ACCESS RIGHT 05/01/2016 Corrie Mckusick, DO [facility]   IR GENERIC HISTORICAL  05/01/2016   IR ANGIOGRAM SELECTIVE EACH ADDITIONAL VESSEL 05/01/2016 Corrie Mckusick, DO [facility]   IR IMAGING GUIDED PORT INSERTION  09/28/2018   IR IVC FILTER PLMT / S&I /IMG GUID/MOD SED  03/18/2019   IR RADIOLOGIST EVAL & MGMT  07/31/2016   IR RADIOLOGIST EVAL & MGMT  12/25/2016   IR RADIOLOGIST EVAL & MGMT  12/10/2017   IR RADIOLOGIST EVAL & MGMT  04/21/2018   IR RADIOLOGIST EVAL & MGMT  08/14/2018   IR THORACENTESIS ASP PLEURAL SPACE W/IMG GUIDE  04/08/2019   RADIOFREQUENCY ABLATION N/A 06/14/2016   Procedure: LIVER MICROWAVE THERMAL  ABLATION;  Surgeon: Corrie Mckusick, DO;  Location: [facility];  Service: Anesthesiology;  Laterality: N/A;   RADIOLOGY WITH ANESTHESIA Right 01/21/2018   Procedure: CT WITH ANESTHESIA/MICROWAVE THERMAL ABLATION OF LIVER;  Surgeon: Corrie Mckusick, DO;  Location: [facility];  Service: Radiology;  Laterality: Right;   TUBAL LIGATION  1978   Family History  Problem Relation Age of Onset   Cancer Mother        Brain cancer   Cancer Father        Prostate cancer   Diabetes Sister    Hypertension Sister    Liver disease Brother    Diabetes Maternal Aunt    Heart Problems Maternal Grandfather    Cancer Paternal Grandfather        Unknown cancer   Diabetes Sister    Social History   Tobacco Use   Smoking status: Never Smoker   Smokeless tobacco: Never Used  Scientific laboratory technician Use: Never used  Substance Use Topics   Alcohol use: No   Drug use: No    ROS Constitutional: Denies fever, chills, weight loss/gain, headaches, insomnia,  night  sweats, and change in appetite. Does c/o fatigue. Eyes: Denies redness, blurred vision, diplopia, discharge, itchy, watery eyes.  ENT: Denies discharge, congestion, post nasal drip, epistaxis, sore throat, earache, hearing loss, dental pain, Tinnitus, Vertigo, Sinus pain, snoring.  Cardio: Denies chest pain, palpitations, irregular heartbeat, syncope, dyspnea, diaphoresis, orthopnea, PND, claudication, edema Respiratory: denies cough, dyspnea, DOE, pleurisy, hoarseness, laryngitis, wheezing.  Gastrointestinal: Denies dysphagia, heartburn, reflux, water brash, pain, cramps, nausea, vomiting, bloating, diarrhea, constipation, hematemesis, melena, hematochezia, jaundice, hemorrhoids Genitourinary: Denies dysuria, frequency, urgency, nocturia, hesitancy, discharge, hematuria, flank pain Breast: Breast lumps, nipple discharge, bleeding.  Musculoskeletal: Denies arthralgia, myalgia, stiffness, Jt. Swelling, pain, limp, and strain/sprain. Denies falls. Skin: Denies puritis, rash, hives, warts, acne, eczema, changing in skin lesion Neuro: No weakness, tremor, incoordination, spasms, paresthesia, pain Psychiatric: Denies confusion, memory loss, sensory loss. Denies Depression. Endocrine: Denies change in weight, skin, hair change, nocturia, and paresthesia, diabetic polys, visual blurring, hyper / hypo glycemic episodes.  Heme/Lymph: No excessive bleeding, bruising, enlarged lymph nodes.  Physical Exam  BP 108/60    Pulse 64    Temp (!) 97 F (36.1 C)    Resp 16    Ht 5' 2"  (1.575 m)    Wt 122 lb 6.4 oz (55.5 kg)    BMI 22.39 kg/m   General Appearance: Well nourished, well groomed and in no apparent distress.  Eyes: PERRLA, EOMs, conjunctiva no swelling or erythema, normal fundi and vessels. Sinuses: No frontal/maxillary tenderness ENT/Mouth: EACs patent / TMs  nl. Nares clear without erythema, swelling, mucoid exudates. Oral hygiene is good. No erythema, swelling, or exudate. Tongue normal,  non-obstructing. Tonsils not swollen or erythematous. Hearing normal.  Neck: Supple, thyroid not palpable. No bruits, nodes or JVD. Respiratory: Respiratory effort normal.  BS equal and clear bilateral without rales, rhonci, wheezing or stridor. Cardio: Heart sounds are normal with regular rate and rhythm and no murmurs, rubs or gallops. Peripheral pulses are normal and equal bilaterally without edema. No aortic or femoral bruits. Chest: symmetric with normal excursions and percussion. Breasts: Symmetric, without lumps, nipple discharge, retractions, or fibrocystic changes.  Abdomen: Flat, soft with bowel sounds active. Nontender, no guarding,  rebound, hernias, masses, or organomegaly.  Lymphatics: Non tender without lymphadenopathy.  Musculoskeletal: Full ROM all peripheral extremities, joint stability, 5/5 strength, and normal gait. Skin: Warm and dry without rashes, lesions, cyanosis, clubbing or  ecchymosis.  Neuro: Cranial nerves intact, reflexes equal bilaterally. Normal muscle tone, no cerebellar symptoms. Sensation intact.  Pysch: Alert and oriented X 3, normal affect, Insight and Judgment appropriate.   Assessment and Plan  1. Annual Preventative Screening Examination  - CBC with Differential/Platelet - COMPLETE METABOLIC PANEL WITH GFR - Magnesium  2. Labile hypertension  - EKG 12-Lead - Urinalysis, Routine w reflex microscopic - TSH  3. Hyperlipidemia associated with type 2 diabetes mellitus (HCC)  - EKG 12-Lead - TSH  4. Type 2 diabetes mellitus with stage 2 chronic kidney disease, without long-term current use of insulin (HCC)  - EKG 12-Lead - Urinalysis, Routine w reflex microscopic - Hemoglobin A1c  5. Vitamin D deficiency  - VITAMIN D 25 Hydroxy  6. Hepatocellular carcinoma ([place])  - CBC with Differential/Platelet - COMPLETE METABOLIC PANEL WITH GFR  7. Liver cirrhosis secondary to NASH (HCC)  - COMPLETE METABOLIC PANEL WITH GFR  8.  Hypothyroidism  - TSH  9. Fatigue, unspecified type  - TSH  10. Screening for ischemic heart disease  - EKG 12-Lead  11. FHx: heart disease  - EKG 12-Lead  12. Medication management  - Uric acid - CBC with Differential/Platelet - COMPLETE METABOLIC PANEL WITH GFR - Magnesium - TSH - Hemoglobin A1c - VITAMIN D 25 Hydroxy  13. Screening for colorectal cancer  - POC Hemoccult Bld/Stl          Patient was counseled in prudent diet to achieve/maintain BMI less than 25 for weight control, BP monitoring, regular exercise and medications. Discussed med's effects and SE's. Screening labs and tests as requested with regular follow-up as recommended. Over 40 minutes of exam, counseling, chart review and high complex critical decision making was performed.   Kirtland Bouchard, MD

## 2019-09-02 ENCOUNTER — Ambulatory Visit (INDEPENDENT_AMBULATORY_CARE_PROVIDER_SITE_OTHER): Payer: PPO | Admitting: Internal Medicine

## 2019-09-02 ENCOUNTER — Encounter: Payer: PPO | Admitting: Internal Medicine

## 2019-09-02 ENCOUNTER — Other Ambulatory Visit: Payer: Self-pay

## 2019-09-02 VITALS — BP 108/60 | HR 64 | Temp 97.0°F | Resp 16 | Ht 62.0 in | Wt 122.4 lb

## 2019-09-02 DIAGNOSIS — K7460 Unspecified cirrhosis of liver: Secondary | ICD-10-CM | POA: Diagnosis not present

## 2019-09-02 DIAGNOSIS — Z136 Encounter for screening for cardiovascular disorders: Secondary | ICD-10-CM | POA: Diagnosis not present

## 2019-09-02 DIAGNOSIS — Z1211 Encounter for screening for malignant neoplasm of colon: Secondary | ICD-10-CM

## 2019-09-02 DIAGNOSIS — Z79899 Other long term (current) drug therapy: Secondary | ICD-10-CM

## 2019-09-02 DIAGNOSIS — N182 Chronic kidney disease, stage 2 (mild): Secondary | ICD-10-CM | POA: Diagnosis not present

## 2019-09-02 DIAGNOSIS — E1122 Type 2 diabetes mellitus with diabetic chronic kidney disease: Secondary | ICD-10-CM | POA: Diagnosis not present

## 2019-09-02 DIAGNOSIS — K7581 Nonalcoholic steatohepatitis (NASH): Secondary | ICD-10-CM

## 2019-09-02 DIAGNOSIS — C220 Liver cell carcinoma: Secondary | ICD-10-CM | POA: Diagnosis not present

## 2019-09-02 DIAGNOSIS — R0989 Other specified symptoms and signs involving the circulatory and respiratory systems: Secondary | ICD-10-CM | POA: Diagnosis not present

## 2019-09-02 DIAGNOSIS — E1169 Type 2 diabetes mellitus with other specified complication: Secondary | ICD-10-CM | POA: Diagnosis not present

## 2019-09-02 DIAGNOSIS — E785 Hyperlipidemia, unspecified: Secondary | ICD-10-CM | POA: Diagnosis not present

## 2019-09-02 DIAGNOSIS — I10 Essential (primary) hypertension: Secondary | ICD-10-CM | POA: Diagnosis not present

## 2019-09-02 DIAGNOSIS — E039 Hypothyroidism, unspecified: Secondary | ICD-10-CM

## 2019-09-02 DIAGNOSIS — Z0001 Encounter for general adult medical examination with abnormal findings: Secondary | ICD-10-CM | POA: Diagnosis not present

## 2019-09-02 DIAGNOSIS — R5383 Other fatigue: Secondary | ICD-10-CM

## 2019-09-02 DIAGNOSIS — E559 Vitamin D deficiency, unspecified: Secondary | ICD-10-CM

## 2019-09-02 DIAGNOSIS — Z0000 Encounter for general adult medical examination without abnormal findings: Secondary | ICD-10-CM

## 2019-09-02 DIAGNOSIS — Z1212 Encounter for screening for malignant neoplasm of rectum: Secondary | ICD-10-CM

## 2019-09-02 DIAGNOSIS — Z8249 Family history of ischemic heart disease and other diseases of the circulatory system: Secondary | ICD-10-CM | POA: Diagnosis not present

## 2019-09-03 ENCOUNTER — Encounter: Payer: Self-pay | Admitting: Hematology & Oncology

## 2019-09-03 ENCOUNTER — Inpatient Hospital Stay: Payer: PPO

## 2019-09-03 ENCOUNTER — Inpatient Hospital Stay (HOSPITAL_BASED_OUTPATIENT_CLINIC_OR_DEPARTMENT_OTHER): Payer: PPO | Admitting: Hematology & Oncology

## 2019-09-03 ENCOUNTER — Other Ambulatory Visit: Payer: Self-pay | Admitting: Internal Medicine

## 2019-09-03 ENCOUNTER — Inpatient Hospital Stay: Payer: PPO | Attending: Hematology & Oncology

## 2019-09-03 VITALS — BP 115/51 | HR 74 | Temp 97.1°F | Resp 18 | Ht 62.0 in | Wt 123.0 lb

## 2019-09-03 DIAGNOSIS — C220 Liver cell carcinoma: Secondary | ICD-10-CM

## 2019-09-03 DIAGNOSIS — R12 Heartburn: Secondary | ICD-10-CM | POA: Diagnosis not present

## 2019-09-03 DIAGNOSIS — K7460 Unspecified cirrhosis of liver: Secondary | ICD-10-CM | POA: Insufficient documentation

## 2019-09-03 DIAGNOSIS — Z853 Personal history of malignant neoplasm of breast: Secondary | ICD-10-CM | POA: Insufficient documentation

## 2019-09-03 DIAGNOSIS — Z95828 Presence of other vascular implants and grafts: Secondary | ICD-10-CM

## 2019-09-03 DIAGNOSIS — E039 Hypothyroidism, unspecified: Secondary | ICD-10-CM

## 2019-09-03 DIAGNOSIS — J90 Pleural effusion, not elsewhere classified: Secondary | ICD-10-CM

## 2019-09-03 DIAGNOSIS — K7581 Nonalcoholic steatohepatitis (NASH): Secondary | ICD-10-CM | POA: Insufficient documentation

## 2019-09-03 DIAGNOSIS — R002 Palpitations: Secondary | ICD-10-CM | POA: Diagnosis not present

## 2019-09-03 DIAGNOSIS — R0602 Shortness of breath: Secondary | ICD-10-CM | POA: Insufficient documentation

## 2019-09-03 DIAGNOSIS — Z7901 Long term (current) use of anticoagulants: Secondary | ICD-10-CM | POA: Diagnosis not present

## 2019-09-03 DIAGNOSIS — Z7984 Long term (current) use of oral hypoglycemic drugs: Secondary | ICD-10-CM | POA: Diagnosis not present

## 2019-09-03 DIAGNOSIS — R110 Nausea: Secondary | ICD-10-CM | POA: Diagnosis not present

## 2019-09-03 DIAGNOSIS — K7682 Hepatic encephalopathy: Secondary | ICD-10-CM

## 2019-09-03 DIAGNOSIS — Z79899 Other long term (current) drug therapy: Secondary | ICD-10-CM | POA: Insufficient documentation

## 2019-09-03 LAB — COMPLETE METABOLIC PANEL WITH GFR
AG Ratio: 1.1 (calc) (ref 1.0–2.5)
ALT: 34 U/L — ABNORMAL HIGH (ref 6–29)
AST: 46 U/L — ABNORMAL HIGH (ref 10–35)
Albumin: 3.4 g/dL — ABNORMAL LOW (ref 3.6–5.1)
Alkaline phosphatase (APISO): 144 U/L (ref 37–153)
BUN: 17 mg/dL (ref 7–25)
CO2: 24 mmol/L (ref 20–32)
Calcium: 10.1 mg/dL (ref 8.6–10.4)
Chloride: 107 mmol/L (ref 98–110)
Creat: 0.75 mg/dL (ref 0.60–0.93)
GFR, Est African American: 89 mL/min/{1.73_m2} (ref >=60)
GFR, Est Non African American: 77 mL/min/{1.73_m2} (ref >=60)
Globulin: 3.1 g/dL (calc) (ref 1.9–3.7)
Glucose, Bld: 161 mg/dL — ABNORMAL HIGH (ref 65–99)
Potassium: 4.4 mmol/L (ref 3.5–5.3)
Sodium: 138 mmol/L (ref 135–146)
Total Bilirubin: 1.5 mg/dL — ABNORMAL HIGH (ref 0.2–1.2)
Total Protein: 6.5 g/dL (ref 6.1–8.1)

## 2019-09-03 LAB — CMP (CANCER CENTER ONLY)
ALT: 31 U/L (ref 0–44)
AST: 45 U/L — ABNORMAL HIGH (ref 15–41)
Albumin: 3.2 g/dL — ABNORMAL LOW (ref 3.5–5.0)
Alkaline Phosphatase: 126 U/L (ref 38–126)
Anion gap: 7 (ref 5–15)
BUN: 17 mg/dL (ref 8–23)
CO2: 23 mmol/L (ref 22–32)
Calcium: 9.7 mg/dL (ref 8.9–10.3)
Chloride: 106 mmol/L (ref 98–111)
Creatinine: 0.76 mg/dL (ref 0.44–1.00)
GFR, Est AFR Am: >60 mL/min (ref >60)
GFR, Estimated: >60 mL/min (ref >60)
Glucose, Bld: 285 mg/dL — ABNORMAL HIGH (ref 70–99)
Potassium: 4.4 mmol/L (ref 3.5–5.1)
Sodium: 136 mmol/L (ref 135–145)
Total Bilirubin: 1.2 mg/dL (ref 0.3–1.2)
Total Protein: 6.4 g/dL — ABNORMAL LOW (ref 6.5–8.1)

## 2019-09-03 LAB — HEMOGLOBIN A1C
Hgb A1c MFr Bld: 6.2 % of total Hgb — ABNORMAL HIGH (ref <5.7)
Mean Plasma Glucose: 131 (calc)
eAG (mmol/L): 7.3 (calc)

## 2019-09-03 LAB — CBC WITH DIFFERENTIAL/PLATELET
Absolute Monocytes: 447 cells/uL (ref 200–950)
Basophils Absolute: 21 cells/uL (ref 0–200)
Basophils Relative: 0.7 %
Eosinophils Absolute: 51 cells/uL (ref 15–500)
Eosinophils Relative: 1.7 %
HCT: 30.6 % — ABNORMAL LOW (ref 35.0–45.0)
Hemoglobin: 10.2 g/dL — ABNORMAL LOW (ref 11.7–15.5)
Lymphs Abs: 465 cells/uL — ABNORMAL LOW (ref 850–3900)
MCH: 36.4 pg — ABNORMAL HIGH (ref 27.0–33.0)
MCHC: 33.3 g/dL (ref 32.0–36.0)
MCV: 109.3 fL — ABNORMAL HIGH (ref 80.0–100.0)
MPV: 11.5 fL (ref 7.5–12.5)
Monocytes Relative: 14.9 %
Neutro Abs: 2016 cells/uL (ref 1500–7800)
Neutrophils Relative %: 67.2 %
Platelets: 75 10*3/uL — ABNORMAL LOW (ref 140–400)
RBC: 2.8 10*6/uL — ABNORMAL LOW (ref 3.80–5.10)
RDW: 13.4 % (ref 11.0–15.0)
Total Lymphocyte: 15.5 %
WBC: 3 10*3/uL — ABNORMAL LOW (ref 3.8–10.8)

## 2019-09-03 LAB — URINALYSIS, ROUTINE W REFLEX MICROSCOPIC
Bilirubin Urine: NEGATIVE
Glucose, UA: NEGATIVE
Hgb urine dipstick: NEGATIVE
Ketones, ur: NEGATIVE
Leukocytes,Ua: NEGATIVE
Nitrite: NEGATIVE
Protein, ur: NEGATIVE
Specific Gravity, Urine: 1.023 (ref 1.001–1.03)
pH: <=5 (ref 5.0–8.0)

## 2019-09-03 LAB — CBC WITH DIFFERENTIAL (CANCER CENTER ONLY)
Abs Immature Granulocytes: 0.01 10*3/uL (ref 0.00–0.07)
Basophils Absolute: 0 10*3/uL (ref 0.0–0.1)
Basophils Relative: 0 %
Eosinophils Absolute: 0 10*3/uL (ref 0.0–0.5)
Eosinophils Relative: 2 %
HCT: 28.2 % — ABNORMAL LOW (ref 36.0–46.0)
Hemoglobin: 9.4 g/dL — ABNORMAL LOW (ref 12.0–15.0)
Immature Granulocytes: 0 %
Lymphocytes Relative: 17 %
Lymphs Abs: 0.4 10*3/uL — ABNORMAL LOW (ref 0.7–4.0)
MCH: 36.6 pg — ABNORMAL HIGH (ref 26.0–34.0)
MCHC: 33.3 g/dL (ref 30.0–36.0)
MCV: 109.7 fL — ABNORMAL HIGH (ref 80.0–100.0)
Monocytes Absolute: 0.2 10*3/uL (ref 0.1–1.0)
Monocytes Relative: 10 %
Neutro Abs: 1.7 10*3/uL (ref 1.7–7.7)
Neutrophils Relative %: 71 %
Platelet Count: 61 10*3/uL — ABNORMAL LOW (ref 150–400)
RBC: 2.57 MIL/uL — ABNORMAL LOW (ref 3.87–5.11)
RDW: 14.2 % (ref 11.5–15.5)
WBC Count: 2.4 10*3/uL — ABNORMAL LOW (ref 4.0–10.5)
nRBC: 0 % (ref 0.0–0.2)

## 2019-09-03 LAB — URIC ACID: Uric Acid, Serum: 3.8 mg/dL (ref 2.5–7.0)

## 2019-09-03 LAB — TSH: TSH: 0.14 mIU/L — ABNORMAL LOW (ref 0.40–4.50)

## 2019-09-03 LAB — AMMONIA: Ammonia: 110 umol/L — ABNORMAL HIGH (ref 9–35)

## 2019-09-03 LAB — VITAMIN D 25 HYDROXY (VIT D DEFICIENCY, FRACTURES): Vit D, 25-Hydroxy: 77 ng/mL (ref 30–100)

## 2019-09-03 LAB — MAGNESIUM: Magnesium: 1.8 mg/dL (ref 1.5–2.5)

## 2019-09-03 MED ORDER — SODIUM CHLORIDE 0.9% FLUSH
10.0000 mL | Freq: Once | INTRAVENOUS | Status: AC
  Administered 2019-09-03: 10 mL via INTRAVENOUS
  Filled 2019-09-03: qty 10

## 2019-09-03 MED ORDER — HEPARIN SOD (PORK) LOCK FLUSH 100 UNIT/ML IV SOLN
500.0000 [IU] | Freq: Once | INTRAVENOUS | Status: AC
  Administered 2019-09-03: 500 [IU] via INTRAVENOUS
  Filled 2019-09-03: qty 5

## 2019-09-03 NOTE — Progress Notes (Signed)
Hematology and Oncology Follow Up Visit  Beverly Davis [id number] 1940-04-22 79 y.o. 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  I must say, that she actually looks pretty good.  She is somewhat alert.  I know that there is some episodes where she does get a little bit "foggy."  Her weight is actually holding pretty steady.  She seems to be eating a little bit better.  She is not having diarrhea.  There is no bleeding.  She has had no shortness of breath.  She has had no problems with headache.  She has had little bit of leg swelling but she is on Aldactone.  Her last alpha-fetoprotein was steady at 619.  Currently, I would say her performance status is ECOG 2.   Medications:  Allergies as of 09/03/2019      Reactions   Metformin And Related Other (See Comments)   Unknown Reaction   Welchol [colesevelam Hcl] Other (See Comments)   Unknown Reaction      Medication List       Accurate as of September 03, 2019 12:05 PM. If you have any questions, ask your nurse or doctor.        cyanocobalamin 1000 MCG tablet Take 1,000 mcg by mouth daily.   furosemide 40 MG tablet Commonly known as: LASIX Take 0.5 tablets (20 mg total) by mouth daily.   Januvia 100 MG tablet Generic drug: sitaGLIPtin Take 100 mg by mouth daily.   lactulose 10 GM/15ML solution Commonly known as: CHRONULAC Take 30 mLs  (20 g total) by mouth 3 (three) times daily.   levothyroxine 112 MCG tablet Commonly known as: Synthroid Take 1 tablet daily on an empty stomach with only water for 30 minutes & no Antacid meds, Calcium or Magnesium for 4 hours & avoid Biotin   lidocaine-prilocaine cream Commonly known as: EMLA Apply 1 application topically as needed. Apply to port  30 minutes before lab or port flush appointment.   magnesium oxide 400 MG tablet Commonly known as: MAG-OX Take 400 mg by mouth daily.   meclizine 25 MG tablet Commonly known as: ANTIVERT 1/2-1 pill up to 3 times daily as needed dizziness.   metFORMIN 500 MG tablet Commonly known as: Glucophage Take 1 tablet with Breakfast & Lunch and 2 tablets with supper for Diabetes   nystatin 100000 UNIT/ML suspension Commonly known as: MYCOSTATIN Take 5 mLs (500,000 Units total) by mouth 4 (four) times daily as needed. Swish and swallow.   OneTouch Delica Plus [id number] Misc 300 Wafers by Does not apply route 3 (three) times daily before meals. Check blood sugar 3 x  /day   OneTouch Ultra test strip Generic drug: glucose blood Check blood Sugar 3 x  /day before Meals   spironolactone 50 MG tablet Commonly known as: Aldactone Take 1 tablet (50 mg total) by mouth 2 (two) times daily.   Vitamin D3 125 MCG (5000 UT) Caps Take 5,000 Units by mouth daily.  Allergies:  Allergies  Allergen Reactions  . Metformin And Related Other (See Comments)    Unknown Reaction  . Welchol [Colesevelam Hcl] Other (See Comments)    Unknown Reaction    Past Medical History, Surgical history, Social history, and Family History were reviewed and updated.  Review of Systems: Review of Systems  Constitutional: Negative.   HENT: Negative.   Eyes: Negative.   Respiratory: Positive for shortness of breath.   Cardiovascular: Positive for palpitations.  Gastrointestinal: Positive for heartburn and nausea.  Genitourinary: Negative.   Musculoskeletal:  Negative.   Skin: Negative.   Neurological: Negative.   Endo/Heme/Allergies: Negative.   Psychiatric/Behavioral: Negative.      Physical Exam:  height is 5' 2"  (1.575 m) and weight is 123 lb (55.8 kg). Her temporal temperature is 97.1 F (36.2 C) (abnormal). Her blood pressure is 115/51 (abnormal) and her pulse is 74. Her respiration is 18 and oxygen saturation is 100%.   Wt Readings from Last 3 Encounters:  09/03/19 123 lb (55.8 kg)  09/02/19 122 lb 6.4 oz (55.5 kg)  07/20/19 125 lb (56.7 kg)    Physical Exam Vitals reviewed.  HENT:     Head: Normocephalic and atraumatic.  Eyes:     Pupils: Pupils are equal, round, and reactive to light.  Cardiovascular:     Rate and Rhythm: Normal rate and regular rhythm.     Heart sounds: Normal heart sounds.  Pulmonary:     Effort: Pulmonary effort is normal.     Breath sounds: Normal breath sounds.  Abdominal:     General: Bowel sounds are normal.     Palpations: Abdomen is soft.  Musculoskeletal:        General: No tenderness or deformity. Normal range of motion.     Cervical back: Normal range of motion.  Lymphadenopathy:     Cervical: No cervical adenopathy.  Skin:    General: Skin is warm and dry.     Findings: No erythema or rash.  Neurological:     Mental Status: She is alert and oriented to person, place, and time.  Psychiatric:        Behavior: Behavior normal.        Thought Content: Thought content normal.        Judgment: Judgment normal.      Lab Results  Component Value Date   WBC 2.4 (L) 09/03/2019   HGB 9.4 (L) 09/03/2019   HCT 28.2 (L) 09/03/2019   MCV 109.7 (H) 09/03/2019   PLT 61 (L) 09/03/2019   Lab Results  Component Value Date   FERRITIN 84 03/17/2019   IRON 72 03/17/2019   TIBC 311 03/17/2019   UIBC 239 03/17/2019   IRONPCTSAT 23 03/17/2019   Lab Results  Component Value Date   RETICCTPCT 1.2 09/05/2015   RBC 2.57 (L) 09/03/2019   RETICCTABS 40,080 09/05/2015   No results found for:  KPAFRELGTCHN, LAMBDASER, KAPLAMBRATIO No results found for: Kandis Cocking, IGMSERUM Lab Results  Component Value Date   TOTALPROTELP 7.1 02/03/2008   ALBUMINELP 54.8 (L) 02/03/2008   A1GS 4.2 02/03/2008   A2GS 9.5 02/03/2008   BETS 8.1 (H) 02/03/2008   BETA2SER 5.8 02/03/2008   GAMS 17.6 02/03/2008   MSPIKE NOT DET 02/03/2008   SPEI * 02/03/2008     Chemistry      Component Value Date/Time   NA 136 09/03/2019 1130   NA 139 01/01/2017 1034   K 4.4 09/03/2019 1130   K 4.1 01/01/2017 1034  CL 106 09/03/2019 1130   CL 107 01/01/2017 1034   CO2 23 09/03/2019 1130   CO2 29 01/01/2017 1034   BUN 17 09/03/2019 1130   BUN 9 01/01/2017 1034   CREATININE 0.76 09/03/2019 1130   CREATININE 0.75 09/02/2019 1544      Component Value Date/Time   CALCIUM 9.7 09/03/2019 1130   CALCIUM 9.4 01/01/2017 1034   ALKPHOS 126 09/03/2019 1130   ALKPHOS 81 01/01/2017 1034   AST 45 (H) 09/03/2019 1130   ALT 31 09/03/2019 1130   ALT 48 (H) 01/01/2017 1034   BILITOT 1.2 09/03/2019 1130       Impression and Plan: Ms. Boesen is a very sweet 79 year old white female.  She has NASH cirrhosis with hepatocellular carcinoma.  She has had progressive disease.  We are just maintaining her and focusing on her quality of life.  I told her that she CANNOT drive.  No she does not like this but I told her that I just do not feel it is safe for her to drive.  She could easily get confused and then cause an accident and could hurt herself or somebody else.  Her brother totally agrees with this.  I would like to think that she is relatively stable right now.  I think we can probably do her back in 6 weeks.  We will see how things are going to 6 weeks.  We will see what her tumor marker is.  If, for some reason, the alpha-fetoprotein is better, I may want to get a CT scan to see if she may have had a delayed response to immunotherapy.    [name], MD 6/11/202112:05 PM

## 2019-09-03 NOTE — Patient Instructions (Signed)
Tunneled Central Venous Catheter Flushing Guide  It is important to flush your tunneled central venous catheter each time you use it, both before and after you use it. Flushing your catheter will help prevent it from clogging. What are the risks? Risks may include:  Infection.  Air getting into the catheter and bloodstream. Supplies needed:  A clean pair of gloves.  A disinfecting wipe. Use an alcohol wipe, chlorhexidine wipe, or iodine wipe as told by your health care provider.  A 10 mL syringe that has been prefilled with saline solution.  An empty 10 mL syringe, if a substance called heparin was injected into your catheter. How to flush your catheter When you flush your catheter, make sure you follow any specific instructions from your health care provider or the manufacturer. These are general guidelines. Flushing your catheter before use If there is heparin in your catheter: 1. Wash your hands with soap and water. 2. Put on gloves. 3. Scrub the injection cap for a minimum of 15 seconds with a disinfecting wipe. 4. Unclamp the catheter. 5. Attach the empty syringe to the injection cap. 6. Pull the syringe plunger back and withdraw 10 mL of blood. 7. Place the syringe into an appropriate waste container. 8. Scrub the injection cap for 15 seconds with a disinfecting wipe. 9. Attach the prefilled syringe to the injection cap. 10. Flush the catheter by pushing the plunger forward until all the liquid from the syringe is in the catheter. 11. Remove the syringe from the injection cap. 12. Clamp the catheter. If there is no heparin in your catheter: 1. Wash your hands with soap and water. 2. Put on gloves. 3. Scrub the injection cap for 15 seconds with a disinfecting wipe. 4. Unclamp the catheter. 5. Attach the prefilled syringe to the injection cap. 6. Flush the catheter by pushing the plunger forward until 5 mL of the liquid from the syringe is in the catheter. 7. Pull back on  the syringe until you see blood in the catheter. 8. If you have been asked to collect any blood, follow your health care provider's instructions. Otherwise, flush the catheter with the rest of the solution from the syringe. 9. Remove the syringe from the injection cap. 10. Clamp the catheter.  Flushing your catheter after use 1. Wash your hands with soap and water. 2. Put on gloves. 3. Scrub the injection cap for 15 seconds with a disinfecting wipe. 4. Unclamp the catheter. 5. Attach the prefilled syringe to the injection cap. 6. Flush the catheter by pushing the plunger forward until all of the liquid from the syringe is in the catheter. 7. Remove the syringe from the injection cap. 8. Clamp the catheter. Problems and solutions  If blood cannot be completely cleared from the injection cap, you may need to have the injection cap replaced.  If the catheter is difficult to flush, use the pulsing method. The pulsing method involves pushing only a few milliliters of solution into the catheter at a time and pausing between pushes.  If you do not see blood in the catheter when you pull back on the syringe, change your body position, such as by raising your arms above your head. Take a deep breath and cough. Then, pull back on the syringe. If you still do not see blood, flush the catheter with a small amount of solution. Then, change positions again and take a breath or cough. Pull back on the syringe again. If you still do not see  blood, finish flushing the catheter and contact your health care provider. Do not use your catheter until your health care provider says it is okay. General tips  Have someone help you flush your catheter, if possible.  Do not force fluid through your catheter.  Do not use a syringe that is larger or smaller than 10 mL. Using a smaller syringe can make the catheter burst.  Do not use your catheter without flushing it first if it has heparin in it. Contact a health  care provider if:  You cannot see any blood in the catheter when you flush it before using it.  Your catheter is difficult to flush. Get help right away if:  You cannot flush the catheter.  The catheter leaks when you flush it or when there is fluid in it.  There are cracks or breaks in the catheter. Summary  It is important to flush your tunneled central venous catheter each time you use it, both before and after you use it.  Scrub the injection cap for 15 seconds with a disinfecting wipe before and after you flush it.  When you flush your catheter, make sure you follow any specific instructions from your health care provider or the manufacturer.  Get help right away if you cannot flush the catheter. This information is not intended to replace advice given to you by your health care provider. Make sure you discuss any questions you have with your health care provider. Document Revised: 12/04/2018 Document Reviewed: 05/27/2018 Elsevier Patient Education  [place].

## 2019-09-04 LAB — AFP TUMOR MARKER: AFP, Serum, Tumor Marker: 594 ng/mL — ABNORMAL HIGH (ref 0.0–8.3)

## 2019-09-06 ENCOUNTER — Encounter: Payer: Self-pay | Admitting: *Deleted

## 2019-09-08 ENCOUNTER — Telehealth: Payer: Self-pay | Admitting: *Deleted

## 2019-09-08 NOTE — Telephone Encounter (Signed)
Call received from patient wanting to know if she should continue Spironolactone.  Pt notified per order of Dr. Marin Olp to continue Spironolactone as ordered twice a day.  Pt appreciative of assistance and has no further questions at this time.

## 2019-09-28 ENCOUNTER — Ambulatory Visit
Admission: RE | Admit: 2019-09-28 | Discharge: 2019-09-28 | Disposition: A | Discharge status: Home / Self Care | Payer: PPO | Source: Ambulatory Visit | Attending: Adult Health | Admitting: Adult Health

## 2019-09-28 ENCOUNTER — Other Ambulatory Visit: Payer: Self-pay

## 2019-09-28 DIAGNOSIS — Z780 Asymptomatic menopausal state: Secondary | ICD-10-CM | POA: Diagnosis not present

## 2019-09-28 DIAGNOSIS — Z1231 Encounter for screening mammogram for malignant neoplasm of breast: Secondary | ICD-10-CM | POA: Diagnosis not present

## 2019-09-28 DIAGNOSIS — M810 Age-related osteoporosis without current pathological fracture: Secondary | ICD-10-CM

## 2019-10-06 ENCOUNTER — Ambulatory Visit (INDEPENDENT_AMBULATORY_CARE_PROVIDER_SITE_OTHER): Payer: PPO | Admitting: *Deleted

## 2019-10-06 ENCOUNTER — Other Ambulatory Visit: Payer: Self-pay

## 2019-10-06 ENCOUNTER — Other Ambulatory Visit: Payer: Self-pay | Admitting: Internal Medicine

## 2019-10-06 DIAGNOSIS — E039 Hypothyroidism, unspecified: Secondary | ICD-10-CM | POA: Diagnosis not present

## 2019-10-06 LAB — TSH: TSH: 0.27 mIU/L — ABNORMAL LOW (ref 0.40–4.50)

## 2019-10-06 NOTE — Progress Notes (Signed)
===========================================================  -   Thyroid Hormone still too high in Blood, So...............  - Decrease to 1/2 tablet Thyroxine 112 mcg every day - 7 days /week = 3.5 tabs /week  - NV - 1 month to recheck TSH betw Aug 4-14

## 2019-10-06 NOTE — Progress Notes (Signed)
Patient is here for a NV to recheck a TSH.  She is taking Levothyroxine 112 mcg 1 tablet on MWF and 1/2 tablets the other 4 days.

## 2019-10-15 ENCOUNTER — Other Ambulatory Visit: Payer: Self-pay

## 2019-10-15 ENCOUNTER — Telehealth: Payer: Self-pay | Admitting: *Deleted

## 2019-10-15 ENCOUNTER — Inpatient Hospital Stay: Payer: PPO

## 2019-10-15 ENCOUNTER — Other Ambulatory Visit: Payer: Self-pay | Admitting: Internal Medicine

## 2019-10-15 ENCOUNTER — Encounter: Payer: Self-pay | Admitting: Family

## 2019-10-15 ENCOUNTER — Inpatient Hospital Stay: Payer: PPO | Attending: Hematology & Oncology | Admitting: Family

## 2019-10-15 VITALS — BP 117/58 | HR 65 | Temp 97.6°F | Resp 18 | Ht 62.0 in | Wt 127.0 lb

## 2019-10-15 DIAGNOSIS — K7290 Hepatic failure, unspecified without coma: Secondary | ICD-10-CM

## 2019-10-15 DIAGNOSIS — Z452 Encounter for adjustment and management of vascular access device: Secondary | ICD-10-CM | POA: Insufficient documentation

## 2019-10-15 DIAGNOSIS — Z8719 Personal history of other diseases of the digestive system: Secondary | ICD-10-CM | POA: Insufficient documentation

## 2019-10-15 DIAGNOSIS — E1122 Type 2 diabetes mellitus with diabetic chronic kidney disease: Secondary | ICD-10-CM

## 2019-10-15 DIAGNOSIS — Z9221 Personal history of antineoplastic chemotherapy: Secondary | ICD-10-CM | POA: Insufficient documentation

## 2019-10-15 DIAGNOSIS — K7581 Nonalcoholic steatohepatitis (NASH): Secondary | ICD-10-CM | POA: Diagnosis not present

## 2019-10-15 DIAGNOSIS — K7460 Unspecified cirrhosis of liver: Secondary | ICD-10-CM | POA: Diagnosis not present

## 2019-10-15 DIAGNOSIS — C220 Liver cell carcinoma: Secondary | ICD-10-CM | POA: Diagnosis not present

## 2019-10-15 DIAGNOSIS — Z86718 Personal history of other venous thrombosis and embolism: Secondary | ICD-10-CM | POA: Insufficient documentation

## 2019-10-15 DIAGNOSIS — Z79899 Other long term (current) drug therapy: Secondary | ICD-10-CM | POA: Insufficient documentation

## 2019-10-15 DIAGNOSIS — K7682 Hepatic encephalopathy: Secondary | ICD-10-CM

## 2019-10-15 DIAGNOSIS — D509 Iron deficiency anemia, unspecified: Secondary | ICD-10-CM

## 2019-10-15 DIAGNOSIS — N182 Chronic kidney disease, stage 2 (mild): Secondary | ICD-10-CM

## 2019-10-15 DIAGNOSIS — Z95828 Presence of other vascular implants and grafts: Secondary | ICD-10-CM

## 2019-10-15 LAB — CBC WITH DIFFERENTIAL (CANCER CENTER ONLY)
Abs Immature Granulocytes: 0.03 10*3/uL (ref 0.00–0.07)
Basophils Absolute: 0 10*3/uL (ref 0.0–0.1)
Basophils Relative: 0 %
Eosinophils Absolute: 0 10*3/uL (ref 0.0–0.5)
Eosinophils Relative: 2 %
HCT: 27.8 % — ABNORMAL LOW (ref 36.0–46.0)
Hemoglobin: 9.1 g/dL — ABNORMAL LOW (ref 12.0–15.0)
Immature Granulocytes: 1 %
Lymphocytes Relative: 21 %
Lymphs Abs: 0.5 10*3/uL — ABNORMAL LOW (ref 0.7–4.0)
MCH: 35.4 pg — ABNORMAL HIGH (ref 26.0–34.0)
MCHC: 32.7 g/dL (ref 30.0–36.0)
MCV: 108.2 fL — ABNORMAL HIGH (ref 80.0–100.0)
Monocytes Absolute: 0.3 10*3/uL (ref 0.1–1.0)
Monocytes Relative: 11 %
Neutro Abs: 1.5 10*3/uL — ABNORMAL LOW (ref 1.7–7.7)
Neutrophils Relative %: 65 %
Platelet Count: 70 10*3/uL — ABNORMAL LOW (ref 150–400)
RBC: 2.57 MIL/uL — ABNORMAL LOW (ref 3.87–5.11)
RDW: 14 % (ref 11.5–15.5)
WBC Count: 2.4 10*3/uL — ABNORMAL LOW (ref 4.0–10.5)
nRBC: 0 % (ref 0.0–0.2)

## 2019-10-15 LAB — CMP (CANCER CENTER ONLY)
ALT: 34 U/L (ref 0–44)
AST: 45 U/L — ABNORMAL HIGH (ref 15–41)
Albumin: 3.3 g/dL — ABNORMAL LOW (ref 3.5–5.0)
Alkaline Phosphatase: 127 U/L — ABNORMAL HIGH (ref 38–126)
Anion gap: 7 (ref 5–15)
BUN: 16 mg/dL (ref 8–23)
CO2: 25 mmol/L (ref 22–32)
Calcium: 9.9 mg/dL (ref 8.9–10.3)
Chloride: 103 mmol/L (ref 98–111)
Creatinine: 0.76 mg/dL (ref 0.44–1.00)
GFR, Est AFR Am: >60 mL/min (ref >60)
GFR, Estimated: >60 mL/min (ref >60)
Glucose, Bld: 407 mg/dL — ABNORMAL HIGH (ref 70–99)
Potassium: 4.3 mmol/L (ref 3.5–5.1)
Sodium: 135 mmol/L (ref 135–145)
Total Bilirubin: 1.3 mg/dL — ABNORMAL HIGH (ref 0.3–1.2)
Total Protein: 6.2 g/dL — ABNORMAL LOW (ref 6.5–8.1)

## 2019-10-15 LAB — AMMONIA: Ammonia: 160 umol/L — ABNORMAL HIGH (ref 9–35)

## 2019-10-15 MED ORDER — HEPARIN SOD (PORK) LOCK FLUSH 100 UNIT/ML IV SOLN
500.0000 [IU] | Freq: Once | INTRAVENOUS | Status: AC
  Administered 2019-10-15: 500 [IU] via INTRAVENOUS
  Filled 2019-10-15: qty 5

## 2019-10-15 MED ORDER — GLIPIZIDE 5 MG PO TABS: ORAL_TABLET | ORAL | 0 refills | Status: DC

## 2019-10-15 MED ORDER — SODIUM CHLORIDE 0.9% FLUSH
10.0000 mL | Freq: Once | INTRAVENOUS | Status: AC
  Administered 2019-10-15: 10 mL via INTRAVENOUS
  Filled 2019-10-15: qty 10

## 2019-10-15 NOTE — Telephone Encounter (Signed)
Patient called and reported blood sugars in mornings have been over 200 for several days. Patient advised to started an new RX for Glipizide 5 mg 1/2 tablet 3 times daily with meals. RX was sent to her pharmacy by Dr Melford Aase.

## 2019-10-15 NOTE — Progress Notes (Signed)
Hematology and Oncology Follow Up Visit  ALYSIAH SUPPA [id number] November 03, 1940 79 y.o. 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 Goh is here today with her husband for follow-up. She is doing fairly well. She still feels dizzy and off balance at times.  She has some "brain fog" at times. Ammonia level today is 160. Both she and her husband state that she is taking her Lactulose as prescribed.  She ambulates with a cane for added support. She states that she has not had any recent falls.  She states that she has frequent urination at night and sometimes can't make it to the bathroom in time. She is currently on Spirolactone BID.  No syncope.  No fever, chills, n/v, cough, rash, SOB, chest pain, palpitations, abdominal pain or changes in bowel or bladder habits.  Puffiness in the lower extremities seems a little better but still more significant in the right ankle. Pedal pulses are 2+. No numbness or tingling in her extremities.   ECOG Performance Status: 1 - Symptomatic but completely ambulatory  Medications:  Allergies as of 10/15/2019      Reactions   Metformin And Related Other (See Comments)   Unknown Reaction   Welchol [colesevelam Hcl] Other (See Comments)   Unknown Reaction      Medication List       Accurate as of October 15, 2019  1:26 PM. If you have any questions, ask your nurse or doctor.        cyanocobalamin 1000 MCG tablet Take  1,000 mcg by mouth daily.   furosemide 40 MG tablet Commonly known as: LASIX Take 0.5 tablets (20 mg total) by mouth daily.   glipiZIDE 5 MG tablet Commonly known as: GLUCOTROL Take 1/2 to 1 tablet 3 x /day with Meals for Diabetes Started by: Kirtland Bouchard, MD   Januvia 100 MG tablet Generic drug: sitaGLIPtin Take 100 mg by mouth daily.   lactulose 10 GM/15ML solution Commonly known as: CHRONULAC Take 30 mLs (20 g total) by mouth 3 (three) times daily.   levothyroxine 112 MCG tablet Commonly known as: Synthroid Take 1 tablet daily on an empty stomach with only water for 30 minutes & no Antacid meds, Calcium or Magnesium for 4 hours & avoid Biotin   lidocaine-prilocaine cream Commonly known as: EMLA Apply 1 application topically as needed. Apply to port  30 minutes before lab or port flush appointment.   magnesium oxide 400 MG tablet Commonly known as: MAG-OX Take 400 mg by mouth daily.   meclizine 25 MG tablet Commonly known as: ANTIVERT 1/2-1 pill up to 3 times daily as needed dizziness.   metFORMIN 500 MG tablet Commonly known as: Glucophage Take 1 tablet with Breakfast & Lunch and 2 tablets with supper for Diabetes   nystatin 100000 UNIT/ML suspension Commonly known as: MYCOSTATIN Take 5 mLs (500,000 Units total) by mouth 4 (four) times daily as needed. Swish and swallow.   OneTouch Delica Plus [id number] Misc 300 Wafers by Does not apply route 3 (three) times daily before meals. Check  blood sugar 3 x  /day   OneTouch Ultra test strip Generic drug: glucose blood Check blood Sugar 3 x  /day before Meals   spironolactone 50 MG tablet Commonly known as: Aldactone Take 1 tablet (50 mg total) by mouth 2 (two) times daily.   Vitamin D3 125 MCG (5000 UT) Caps Take 5,000 Units by mouth daily.       Allergies:  Allergies  Allergen Reactions  . Metformin And Related Other (See Comments)    Unknown Reaction  . Welchol [Colesevelam Hcl] Other (See Comments)      Unknown Reaction    Past Medical History, Surgical history, Social history, and Family History were reviewed and updated.  Review of Systems: All other 10 point review of systems is negative.   Physical Exam:  vitals were not taken for this visit.   Wt Readings from Last 3 Encounters:  10/06/19 126 lb 6.4 oz (57.3 kg)  09/03/19 123 lb (55.8 kg)  09/02/19 122 lb 6.4 oz (55.5 kg)    Ocular: Sclerae unicteric, pupils equal, round and reactive to light Ear-nose-throat: Oropharynx clear, dentition fair Lymphatic: No cervical or supraclavicular adenopathy Lungs no rales or rhonchi, good excursion bilaterally Heart regular rate and rhythm, no murmur appreciated Abd soft, nontender, positive bowel sounds, no liver or spleen tip palpated on exam, no fluid wave  MSK no focal spinal tenderness, no joint edema Neuro: non-focal, well-oriented, appropriate affect Breasts: Deferred   Lab Results  Component Value Date   WBC 2.4 (L) 09/03/2019   HGB 9.4 (L) 09/03/2019   HCT 28.2 (L) 09/03/2019   MCV 109.7 (H) 09/03/2019   PLT 61 (L) 09/03/2019   Lab Results  Component Value Date   FERRITIN 84 03/17/2019   IRON 72 03/17/2019   TIBC 311 03/17/2019   UIBC 239 03/17/2019   IRONPCTSAT 23 03/17/2019   Lab Results  Component Value Date   RETICCTPCT 1.2 09/05/2015   RBC 2.57 (L) 09/03/2019   RETICCTABS 40,080 09/05/2015   No results found for: KPAFRELGTCHN, LAMBDASER, KAPLAMBRATIO No results found for: Kandis Cocking, IGMSERUM Lab Results  Component Value Date   TOTALPROTELP 7.1 02/03/2008   ALBUMINELP 54.8 (L) 02/03/2008   A1GS 4.2 02/03/2008   A2GS 9.5 02/03/2008   BETS 8.1 (H) 02/03/2008   BETA2SER 5.8 02/03/2008   GAMS 17.6 02/03/2008   MSPIKE NOT DET 02/03/2008   SPEI * 02/03/2008     Chemistry      Component Value Date/Time   NA 136 09/03/2019 1130   NA 139 01/01/2017 1034   K 4.4 09/03/2019 1130   K 4.1 01/01/2017 1034   CL 106 09/03/2019 1130   CL 107  01/01/2017 1034   CO2 23 09/03/2019 1130   CO2 29 01/01/2017 1034   BUN 17 09/03/2019 1130   BUN 9 01/01/2017 1034   CREATININE 0.76 09/03/2019 1130   CREATININE 0.75 09/02/2019 1544      Component Value Date/Time   CALCIUM 9.7 09/03/2019 1130   CALCIUM 9.4 01/01/2017 1034   ALKPHOS 126 09/03/2019 1130   ALKPHOS 81 01/01/2017 1034   AST 45 (H) 09/03/2019 1130   ALT 31 09/03/2019 1130   ALT 48 (H) 01/01/2017 1034   BILITOT 1.2 09/03/2019 1130       Impression and Plan: Ms. Riche is a very sweet 79 year old white female.  She has NASH cirrhosis with hepatocellular carcinoma, progressive disease. We are now focused on comfort care and quality of life.  She is alert and  oriented at this time. She does not seem to be confused and was able to go over her medication list with me in detail.  Her ammonia level is up to 160. We will just continue to watch for now. AFP at her last visit was 594. Today's result is pending.  Blood glucose was also elevated. She states that she is still on Metformin but recently was taken off of the Januvia. Have forwarded to her PCP.  We will have her cut back on her spirolactone to 50 mg PO daily.  We will plan to see her again in another 6 weeks.  They will contact our office with any questions or concerns. We can certainly see her sooner if needed.   Laverna Peace, NP 7/23/20211:26 PM

## 2019-10-15 NOTE — Patient Instructions (Signed)
Tunneled Central Venous Catheter Flushing Guide  It is important to flush your tunneled central venous catheter each time you use it, both before and after you use it. Flushing your catheter will help prevent it from clogging. What are the risks? Risks may include:  Infection.  Air getting into the catheter and bloodstream. Supplies needed:  A clean pair of gloves.  A disinfecting wipe. Use an alcohol wipe, chlorhexidine wipe, or iodine wipe as told by your health care provider.  A 10 mL syringe that has been prefilled with saline solution.  An empty 10 mL syringe, if a substance called heparin was injected into your catheter. How to flush your catheter When you flush your catheter, make sure you follow any specific instructions from your health care provider or the manufacturer. These are general guidelines. Flushing your catheter before use If there is heparin in your catheter: 1. Wash your hands with soap and water. 2. Put on gloves. 3. Scrub the injection cap for a minimum of 15 seconds with a disinfecting wipe. 4. Unclamp the catheter. 5. Attach the empty syringe to the injection cap. 6. Pull the syringe plunger back and withdraw 10 mL of blood. 7. Place the syringe into an appropriate waste container. 8. Scrub the injection cap for 15 seconds with a disinfecting wipe. 9. Attach the prefilled syringe to the injection cap. 10. Flush the catheter by pushing the plunger forward until all the liquid from the syringe is in the catheter. 11. Remove the syringe from the injection cap. 12. Clamp the catheter. If there is no heparin in your catheter: 1. Wash your hands with soap and water. 2. Put on gloves. 3. Scrub the injection cap for 15 seconds with a disinfecting wipe. 4. Unclamp the catheter. 5. Attach the prefilled syringe to the injection cap. 6. Flush the catheter by pushing the plunger forward until 5 mL of the liquid from the syringe is in the catheter. 7. Pull back on  the syringe until you see blood in the catheter. 8. If you have been asked to collect any blood, follow your health care provider's instructions. Otherwise, flush the catheter with the rest of the solution from the syringe. 9. Remove the syringe from the injection cap. 10. Clamp the catheter.  Flushing your catheter after use 1. Wash your hands with soap and water. 2. Put on gloves. 3. Scrub the injection cap for 15 seconds with a disinfecting wipe. 4. Unclamp the catheter. 5. Attach the prefilled syringe to the injection cap. 6. Flush the catheter by pushing the plunger forward until all of the liquid from the syringe is in the catheter. 7. Remove the syringe from the injection cap. 8. Clamp the catheter. Problems and solutions  If blood cannot be completely cleared from the injection cap, you may need to have the injection cap replaced.  If the catheter is difficult to flush, use the pulsing method. The pulsing method involves pushing only a few milliliters of solution into the catheter at a time and pausing between pushes.  If you do not see blood in the catheter when you pull back on the syringe, change your body position, such as by raising your arms above your head. Take a deep breath and cough. Then, pull back on the syringe. If you still do not see blood, flush the catheter with a small amount of solution. Then, change positions again and take a breath or cough. Pull back on the syringe again. If you still do not see  blood, finish flushing the catheter and contact your health care provider. Do not use your catheter until your health care provider says it is okay. General tips  Have someone help you flush your catheter, if possible.  Do not force fluid through your catheter.  Do not use a syringe that is larger or smaller than 10 mL. Using a smaller syringe can make the catheter burst.  Do not use your catheter without flushing it first if it has heparin in it. Contact a health  care provider if:  You cannot see any blood in the catheter when you flush it before using it.  Your catheter is difficult to flush. Get help right away if:  You cannot flush the catheter.  The catheter leaks when you flush it or when there is fluid in it.  There are cracks or breaks in the catheter. Summary  It is important to flush your tunneled central venous catheter each time you use it, both before and after you use it.  Scrub the injection cap for 15 seconds with a disinfecting wipe before and after you flush it.  When you flush your catheter, make sure you follow any specific instructions from your health care provider or the manufacturer.  Get help right away if you cannot flush the catheter. This information is not intended to replace advice given to you by your health care provider. Make sure you discuss any questions you have with your health care provider. Document Revised: 12/04/2018 Document Reviewed: 05/27/2018 Elsevier Patient Education  [place].

## 2019-10-16 LAB — AFP TUMOR MARKER: AFP, Serum, Tumor Marker: 570 ng/mL — ABNORMAL HIGH (ref 0.0–8.3)

## 2019-10-27 ENCOUNTER — Other Ambulatory Visit: Payer: Self-pay | Admitting: Internal Medicine

## 2019-10-28 ENCOUNTER — Other Ambulatory Visit: Payer: Self-pay | Admitting: *Deleted

## 2019-10-28 DIAGNOSIS — Z1211 Encounter for screening for malignant neoplasm of colon: Secondary | ICD-10-CM

## 2019-10-28 DIAGNOSIS — Z1212 Encounter for screening for malignant neoplasm of rectum: Secondary | ICD-10-CM | POA: Diagnosis not present

## 2019-10-28 LAB — POC HEMOCCULT BLD/STL (HOME/3-CARD/SCREEN)
Card #2 Fecal Occult Blod, POC: NEGATIVE
Card #3 Fecal Occult Blood, POC: NEGATIVE
Fecal Occult Blood, POC: NEGATIVE

## 2019-11-02 ENCOUNTER — Ambulatory Visit (INDEPENDENT_AMBULATORY_CARE_PROVIDER_SITE_OTHER): Payer: PPO

## 2019-11-02 ENCOUNTER — Other Ambulatory Visit: Payer: Self-pay

## 2019-11-02 DIAGNOSIS — E032 Hypothyroidism due to medicaments and other exogenous substances: Secondary | ICD-10-CM

## 2019-11-02 DIAGNOSIS — E039 Hypothyroidism, unspecified: Secondary | ICD-10-CM | POA: Diagnosis not present

## 2019-11-02 LAB — TSH: TSH: 12.27 mIU/L — ABNORMAL HIGH (ref 0.40–4.50)

## 2019-11-02 NOTE — Progress Notes (Signed)
Patient presents to the office for a nurse visit to have labs done to check TSH levels. Vitals taken and recorded.

## 2019-11-03 ENCOUNTER — Other Ambulatory Visit: Payer: Self-pay | Admitting: Internal Medicine

## 2019-11-03 DIAGNOSIS — E039 Hypothyroidism, unspecified: Secondary | ICD-10-CM

## 2019-11-03 MED ORDER — LEVOTHYROXINE SODIUM 100 MCG PO TABS: ORAL_TABLET | ORAL | 1 refills | Status: DC

## 2019-11-03 NOTE — Progress Notes (Signed)
============================================================  -   Currently taking 1/2 tab thyroxine 112 mcg da = 66 mg / day   - Send in new Rx for 100 mcg /day and recommend   - Repeat TSH at 9/16 OV w/Ashley

## 2019-11-26 ENCOUNTER — Inpatient Hospital Stay: Payer: PPO

## 2019-11-26 ENCOUNTER — Other Ambulatory Visit: Payer: Self-pay | Admitting: *Deleted

## 2019-11-26 ENCOUNTER — Encounter: Payer: Self-pay | Admitting: Hematology & Oncology

## 2019-11-26 ENCOUNTER — Other Ambulatory Visit: Payer: Self-pay

## 2019-11-26 ENCOUNTER — Inpatient Hospital Stay: Payer: PPO | Attending: Hematology & Oncology

## 2019-11-26 ENCOUNTER — Inpatient Hospital Stay (HOSPITAL_BASED_OUTPATIENT_CLINIC_OR_DEPARTMENT_OTHER): Payer: PPO | Admitting: Hematology & Oncology

## 2019-11-26 VITALS — BP 128/51 | HR 68 | Temp 98.2°F | Resp 20 | Wt 132.1 lb

## 2019-11-26 DIAGNOSIS — Z7901 Long term (current) use of anticoagulants: Secondary | ICD-10-CM | POA: Insufficient documentation

## 2019-11-26 DIAGNOSIS — R351 Nocturia: Secondary | ICD-10-CM | POA: Insufficient documentation

## 2019-11-26 DIAGNOSIS — K7460 Unspecified cirrhosis of liver: Secondary | ICD-10-CM | POA: Insufficient documentation

## 2019-11-26 DIAGNOSIS — Z95828 Presence of other vascular implants and grafts: Secondary | ICD-10-CM

## 2019-11-26 DIAGNOSIS — K7581 Nonalcoholic steatohepatitis (NASH): Secondary | ICD-10-CM | POA: Diagnosis not present

## 2019-11-26 DIAGNOSIS — Z79899 Other long term (current) drug therapy: Secondary | ICD-10-CM | POA: Diagnosis not present

## 2019-11-26 DIAGNOSIS — R0602 Shortness of breath: Secondary | ICD-10-CM | POA: Insufficient documentation

## 2019-11-26 DIAGNOSIS — C220 Liver cell carcinoma: Secondary | ICD-10-CM | POA: Diagnosis not present

## 2019-11-26 DIAGNOSIS — R110 Nausea: Secondary | ICD-10-CM | POA: Insufficient documentation

## 2019-11-26 DIAGNOSIS — R12 Heartburn: Secondary | ICD-10-CM | POA: Insufficient documentation

## 2019-11-26 DIAGNOSIS — R002 Palpitations: Secondary | ICD-10-CM | POA: Insufficient documentation

## 2019-11-26 DIAGNOSIS — Z853 Personal history of malignant neoplasm of breast: Secondary | ICD-10-CM | POA: Diagnosis not present

## 2019-11-26 DIAGNOSIS — K7682 Hepatic encephalopathy: Secondary | ICD-10-CM

## 2019-11-26 LAB — CBC WITH DIFFERENTIAL (CANCER CENTER ONLY)
Abs Immature Granulocytes: 0.01 10*3/uL (ref 0.00–0.07)
Basophils Absolute: 0 10*3/uL (ref 0.0–0.1)
Basophils Relative: 0 %
Eosinophils Absolute: 0.1 10*3/uL (ref 0.0–0.5)
Eosinophils Relative: 2 %
HCT: 27.5 % — ABNORMAL LOW (ref 36.0–46.0)
Hemoglobin: 9.1 g/dL — ABNORMAL LOW (ref 12.0–15.0)
Immature Granulocytes: 0 %
Lymphocytes Relative: 22 %
Lymphs Abs: 0.5 10*3/uL — ABNORMAL LOW (ref 0.7–4.0)
MCH: 36.3 pg — ABNORMAL HIGH (ref 26.0–34.0)
MCHC: 33.1 g/dL (ref 30.0–36.0)
MCV: 109.6 fL — ABNORMAL HIGH (ref 80.0–100.0)
Monocytes Absolute: 0.3 10*3/uL (ref 0.1–1.0)
Monocytes Relative: 12 %
Neutro Abs: 1.4 10*3/uL — ABNORMAL LOW (ref 1.7–7.7)
Neutrophils Relative %: 64 %
Platelet Count: 90 10*3/uL — ABNORMAL LOW (ref 150–400)
RBC: 2.51 MIL/uL — ABNORMAL LOW (ref 3.87–5.11)
RDW: 15.3 % (ref 11.5–15.5)
WBC Count: 2.3 10*3/uL — ABNORMAL LOW (ref 4.0–10.5)
nRBC: 0 % (ref 0.0–0.2)

## 2019-11-26 LAB — CMP (CANCER CENTER ONLY)
ALT: 49 U/L — ABNORMAL HIGH (ref 0–44)
AST: 59 U/L — ABNORMAL HIGH (ref 15–41)
Albumin: 3.1 g/dL — ABNORMAL LOW (ref 3.5–5.0)
Alkaline Phosphatase: 133 U/L — ABNORMAL HIGH (ref 38–126)
Anion gap: 7 (ref 5–15)
BUN: 13 mg/dL (ref 8–23)
CO2: 25 mmol/L (ref 22–32)
Calcium: 9.9 mg/dL (ref 8.9–10.3)
Chloride: 107 mmol/L (ref 98–111)
Creatinine: 0.88 mg/dL (ref 0.44–1.00)
GFR, Est AFR Am: >60 mL/min (ref >60)
GFR, Estimated: >60 mL/min (ref >60)
Glucose, Bld: 244 mg/dL — ABNORMAL HIGH (ref 70–99)
Potassium: 3.9 mmol/L (ref 3.5–5.1)
Sodium: 139 mmol/L (ref 135–145)
Total Bilirubin: 1.4 mg/dL — ABNORMAL HIGH (ref 0.3–1.2)
Total Protein: 6.2 g/dL — ABNORMAL LOW (ref 6.5–8.1)

## 2019-11-26 LAB — AMMONIA: Ammonia: 148 umol/L — ABNORMAL HIGH (ref 9–35)

## 2019-11-26 MED ORDER — MIRABEGRON ER 25 MG PO TB24: 25.0000 mg | ORAL_TABLET | Freq: Every day | ORAL | 3 refills | Status: AC

## 2019-11-26 MED ORDER — SODIUM CHLORIDE 0.9% FLUSH
10.0000 mL | Freq: Once | INTRAVENOUS | Status: AC
  Administered 2019-11-26: 10 mL via INTRAVENOUS
  Filled 2019-11-26: qty 10

## 2019-11-26 MED ORDER — HEPARIN SOD (PORK) LOCK FLUSH 100 UNIT/ML IV SOLN
500.0000 [IU] | Freq: Once | INTRAVENOUS | Status: AC
  Administered 2019-11-26: 500 [IU] via INTRAVENOUS
  Filled 2019-11-26: qty 5

## 2019-11-26 NOTE — Progress Notes (Signed)
Hematology and Oncology Follow Up Visit  Beverly Davis [id number] [birth year] 79 y.o. 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  Unfortunately, she comes alone because of the restrictions for the coronavirus.  As I like she may need cataract surgery.  From my point of view, I do not see a problem with her getting cataract surgery.  She still wants to drive.  I just do not think it is safe for her to drive.  I told her that she is not allowed to drive.  She is apparently having some problems with nocturia.  I am sure this is probably from the diuretics that she takes.  She wants to try to slow down going at nighttime.  I will have to see what we can try to do for this.  Her last alpha-fetoprotein back in July was 570.  This actually is holding pretty stable.  Her ammonia is still quite high.  Today, the ammonia was 148.  I do think that her mental status really is any different.  Her blood sugar is still quite high which is no surprise.  Her bilirubin is 1.4 which is low and stable.  She is eating okay.  She is having no problems with bleeding.  She is having no problems with fever.  There is no cough or shortness of breath.  She does not feel any fullness in her abdomen.  I would have to say that overall, her performance status is probably ECOG 2-3.  Medications:  Allergies as of 11/26/2019      Reactions    Metformin And Related Other (See Comments)   Unknown Reaction   Welchol [colesevelam Hcl] Other (See Comments)   Unknown Reaction      Medication List       Accurate as of November 26, 2019  2:54 PM. If you have any questions, ask your nurse or doctor.        cyanocobalamin 1000 MCG tablet Take 1,000 mcg by mouth daily.   furosemide 40 MG tablet Commonly known as: LASIX Take 0.5 tablets (20 mg total) by mouth daily.   glipiZIDE 5 MG tablet Commonly known as: GLUCOTROL Take 1/2 to 1 tablet 3 x /day with Meals for Diabetes   Januvia 100 MG tablet Generic drug: sitaGLIPtin Take 100 mg by mouth daily.   lactulose 10 GM/15ML solution Commonly known as: CHRONULAC Take 30 mLs (20 g total) by mouth 3 (three) times daily.   levothyroxine 100 MCG tablet Commonly known as: Synthroid Take 1 tablet daily on an empty stomach with only water for 30 minutes & no Antacid meds, Calcium or Magnesium for 4 hours & avoid Biotin   lidocaine-prilocaine cream Commonly known as: EMLA Apply 1 application topically as needed. Apply to port  30 minutes before lab or port flush appointment.   magnesium oxide 400 MG tablet Commonly known as: MAG-OX Take 400 mg by mouth daily.   meclizine 25 MG tablet Commonly known as: ANTIVERT 1/2-1 pill up to 3  times daily as needed dizziness.   metFORMIN 500 MG tablet Commonly known as: GLUCOPHAGE Take 1 tablet 2 x /day with Breakfast & Lunch and 2 tablets with Supper for Diabetes   nystatin 100000 UNIT/ML suspension Commonly known as: MYCOSTATIN Take 5 mLs (500,000 Units total) by mouth 4 (four) times daily as needed. Swish and swallow.   OneTouch Delica Plus [id number] Misc 300 Wafers by Does not apply route 3 (three) times daily before meals. Check blood sugar 3 x  /day   OneTouch Ultra test strip Generic drug: glucose blood Check blood Sugar 3 x  /day before Meals   spironolactone 50 MG tablet Commonly known as: Aldactone Take 1 tablet (50 mg  total) by mouth 2 (two) times daily.   Vitamin D3 125 MCG (5000 UT) Caps Take 5,000 Units by mouth daily.       Allergies:  Allergies  Allergen Reactions  . Metformin And Related Other (See Comments)    Unknown Reaction  . Welchol [Colesevelam Hcl] Other (See Comments)    Unknown Reaction    Past Medical History, Surgical history, Social history, and Family History were reviewed and updated.  Review of Systems: Review of Systems  Constitutional: Negative.   HENT: Negative.   Eyes: Negative.   Respiratory: Positive for shortness of breath.   Cardiovascular: Positive for palpitations.  Gastrointestinal: Positive for heartburn and nausea.  Genitourinary: Negative.   Musculoskeletal: Negative.   Skin: Negative.   Neurological: Negative.   Endo/Heme/Allergies: Negative.   Psychiatric/Behavioral: Negative.      Physical Exam:  weight is 132 lb 1.3 oz (59.9 kg). Her oral temperature is 98.2 F (36.8 C). Her blood pressure is 128/51 (abnormal) and her pulse is 68. Her respiration is 20 and oxygen saturation is 100%.   Wt Readings from Last 3 Encounters:  11/26/19 132 lb 1.3 oz (59.9 kg)  11/02/19 131 lb (59.4 kg)  10/15/19 127 lb (57.6 kg)    Physical Exam Vitals reviewed.  HENT:     Head: Normocephalic and atraumatic.  Eyes:     Pupils: Pupils are equal, round, and reactive to light.  Cardiovascular:     Rate and Rhythm: Normal rate and regular rhythm.     Heart sounds: Normal heart sounds.  Pulmonary:     Effort: Pulmonary effort is normal.     Breath sounds: Normal breath sounds.  Abdominal:     General: Bowel sounds are normal.     Palpations: Abdomen is soft.  Musculoskeletal:        General: No tenderness or deformity. Normal range of motion.     Cervical back: Normal range of motion.  Lymphadenopathy:     Cervical: No cervical adenopathy.  Skin:    General: Skin is warm and dry.     Findings: No erythema or rash.  Neurological:     Mental Status:  She is alert and oriented to person, place, and time.  Psychiatric:        Behavior: Behavior normal.        Thought Content: Thought content normal.        Judgment: Judgment normal.      Lab Results  Component Value Date   WBC 2.3 (L) 11/26/2019   HGB 9.1 (L) 11/26/2019   HCT 27.5 (L) 11/26/2019   MCV 109.6 (H) 11/26/2019   PLT 90 (L) 11/26/2019   Lab Results  Component Value Date   FERRITIN 84 03/17/2019   IRON 72 03/17/2019   TIBC 311  03/17/2019   UIBC 239 03/17/2019   IRONPCTSAT 23 03/17/2019   Lab Results  Component Value Date   RETICCTPCT 1.2 09/05/2015   RBC 2.51 (L) 11/26/2019   RETICCTABS 40,080 09/05/2015   No results found for: KPAFRELGTCHN, LAMBDASER, KAPLAMBRATIO No results found for: Kandis Cocking, [facility] Lab Results  Component Value Date   TOTALPROTELP 7.1 02/03/2008   ALBUMINELP 54.8 (L) 02/03/2008   A1GS 4.2 02/03/2008   A2GS 9.5 02/03/2008   BETS 8.1 (H) 02/03/2008   BETA2SER 5.8 02/03/2008   GAMS 17.6 02/03/2008   MSPIKE NOT DET 02/03/2008   SPEI * 02/03/2008     Chemistry      Component Value Date/Time   NA 139 11/26/2019 1400   NA 139 01/01/2017 1034   K 3.9 11/26/2019 1400   K 4.1 01/01/2017 1034   CL 107 11/26/2019 1400   CL 107 01/01/2017 1034   CO2 25 11/26/2019 1400   CO2 29 01/01/2017 1034   BUN 13 11/26/2019 1400   BUN 9 01/01/2017 1034   CREATININE 0.88 11/26/2019 1400   CREATININE 0.75 09/02/2019 1544      Component Value Date/Time   CALCIUM 9.9 11/26/2019 1400   CALCIUM 9.4 01/01/2017 1034   ALKPHOS 133 (H) 11/26/2019 1400   ALKPHOS 81 01/01/2017 1034   AST 59 (H) 11/26/2019 1400   ALT 49 (H) 11/26/2019 1400   ALT 48 (H) 01/01/2017 1034   BILITOT 1.4 (H) 11/26/2019 1400       Impression and Plan: Beverly Davis is a very sweet 79 year old white female.  She has NASH cirrhosis with hepatocellular carcinoma.  She has had progressive disease.  We are just maintaining her and focusing on her quality of life.  I  am just happy that her quality of life is doing fairly well right now.  This is certainly a positive for her.  I am not sure how much we can really do to help prevent the nocturia.  I think as long she is taking diuretics, she probably will have nocturia.  As long as her blood sugars are high, she will have some urinary frequency.  We will try to get her back in another month.  I suppose I would not be surprised if she ended up in the hospital with some issue.      [name], MD 9/3/20212:54 PM

## 2019-11-26 NOTE — Patient Instructions (Signed)
Tunneled Central Venous Catheter Flushing Guide  It is important to flush your tunneled central venous catheter each time you use it, both before and after you use it. Flushing your catheter will help prevent it from clogging. What are the risks? Risks may include:  Infection.  Air getting into the catheter and bloodstream. Supplies needed:  A clean pair of gloves.  A disinfecting wipe. Use an alcohol wipe, chlorhexidine wipe, or iodine wipe as told by your health care provider.  A 10 mL syringe that has been prefilled with saline solution.  An empty 10 mL syringe, if a substance called heparin was injected into your catheter. How to flush your catheter When you flush your catheter, make sure you follow any specific instructions from your health care provider or the manufacturer. These are general guidelines. Flushing your catheter before use If there is heparin in your catheter: 1. Wash your hands with soap and water. 2. Put on gloves. 3. Scrub the injection cap for a minimum of 15 seconds with a disinfecting wipe. 4. Unclamp the catheter. 5. Attach the empty syringe to the injection cap. 6. Pull the syringe plunger back and withdraw 10 mL of blood. 7. Place the syringe into an appropriate waste container. 8. Scrub the injection cap for 15 seconds with a disinfecting wipe. 9. Attach the prefilled syringe to the injection cap. 10. Flush the catheter by pushing the plunger forward until all the liquid from the syringe is in the catheter. 11. Remove the syringe from the injection cap. 12. Clamp the catheter. If there is no heparin in your catheter: 1. Wash your hands with soap and water. 2. Put on gloves. 3. Scrub the injection cap for 15 seconds with a disinfecting wipe. 4. Unclamp the catheter. 5. Attach the prefilled syringe to the injection cap. 6. Flush the catheter by pushing the plunger forward until 5 mL of the liquid from the syringe is in the catheter. 7. Pull back on  the syringe until you see blood in the catheter. 8. If you have been asked to collect any blood, follow your health care provider's instructions. Otherwise, flush the catheter with the rest of the solution from the syringe. 9. Remove the syringe from the injection cap. 10. Clamp the catheter.  Flushing your catheter after use 1. Wash your hands with soap and water. 2. Put on gloves. 3. Scrub the injection cap for 15 seconds with a disinfecting wipe. 4. Unclamp the catheter. 5. Attach the prefilled syringe to the injection cap. 6. Flush the catheter by pushing the plunger forward until all of the liquid from the syringe is in the catheter. 7. Remove the syringe from the injection cap. 8. Clamp the catheter. Problems and solutions  If blood cannot be completely cleared from the injection cap, you may need to have the injection cap replaced.  If the catheter is difficult to flush, use the pulsing method. The pulsing method involves pushing only a few milliliters of solution into the catheter at a time and pausing between pushes.  If you do not see blood in the catheter when you pull back on the syringe, change your body position, such as by raising your arms above your head. Take a deep breath and cough. Then, pull back on the syringe. If you still do not see blood, flush the catheter with a small amount of solution. Then, change positions again and take a breath or cough. Pull back on the syringe again. If you still do not see  blood, finish flushing the catheter and contact your health care provider. Do not use your catheter until your health care provider says it is okay. General tips  Have someone help you flush your catheter, if possible.  Do not force fluid through your catheter.  Do not use a syringe that is larger or smaller than 10 mL. Using a smaller syringe can make the catheter burst.  Do not use your catheter without flushing it first if it has heparin in it. Contact a health  care provider if:  You cannot see any blood in the catheter when you flush it before using it.  Your catheter is difficult to flush. Get help right away if:  You cannot flush the catheter.  The catheter leaks when you flush it or when there is fluid in it.  There are cracks or breaks in the catheter. Summary  It is important to flush your tunneled central venous catheter each time you use it, both before and after you use it.  Scrub the injection cap for 15 seconds with a disinfecting wipe before and after you flush it.  When you flush your catheter, make sure you follow any specific instructions from your health care provider or the manufacturer.  Get help right away if you cannot flush the catheter. This information is not intended to replace advice given to you by your health care provider. Make sure you discuss any questions you have with your health care provider. Document Revised: 12/04/2018 Document Reviewed: 05/27/2018 Elsevier Patient Education  [employer].

## 2019-11-27 LAB — AFP TUMOR MARKER: AFP, Serum, Tumor Marker: 666 ng/mL — ABNORMAL HIGH (ref 0.0–8.3)

## 2019-12-01 ENCOUNTER — Telehealth: Payer: Self-pay | Admitting: *Deleted

## 2019-12-01 NOTE — Telephone Encounter (Signed)
Referral placed to [facility] per order of Dr. Marin Olp.

## 2019-12-01 NOTE — Telephone Encounter (Signed)
Message received from patient's son, [name] stating that pt is not able to walk, is unable to feed herself and is crying off and on.  [name] is requesting an order for hospice for pt at this time.  Call placed back to patients husband and he states that pt is better after eating a biscuit this morning and is in agreement for hospice care at this time, but requests that hospice contact patient's son [name].  Dr. Marin Olp notified and order received for hospice per Dr Marin Olp.

## 2019-12-09 ENCOUNTER — Other Ambulatory Visit: Payer: Self-pay

## 2019-12-09 ENCOUNTER — Ambulatory Visit (INDEPENDENT_AMBULATORY_CARE_PROVIDER_SITE_OTHER): Payer: PPO | Admitting: Adult Health

## 2019-12-09 ENCOUNTER — Encounter: Payer: Self-pay | Admitting: Adult Health

## 2019-12-09 VITALS — BP 120/76 | HR 72 | Temp 97.9°F | Wt 135.0 lb

## 2019-12-09 DIAGNOSIS — I10 Essential (primary) hypertension: Secondary | ICD-10-CM | POA: Diagnosis not present

## 2019-12-09 DIAGNOSIS — K7290 Hepatic failure, unspecified without coma: Secondary | ICD-10-CM | POA: Diagnosis not present

## 2019-12-09 DIAGNOSIS — Z6823 Body mass index (BMI) 23.0-23.9, adult: Secondary | ICD-10-CM

## 2019-12-09 DIAGNOSIS — K7682 Hepatic encephalopathy: Secondary | ICD-10-CM

## 2019-12-09 DIAGNOSIS — R911 Solitary pulmonary nodule: Secondary | ICD-10-CM

## 2019-12-09 DIAGNOSIS — E032 Hypothyroidism due to medicaments and other exogenous substances: Secondary | ICD-10-CM

## 2019-12-09 DIAGNOSIS — E1122 Type 2 diabetes mellitus with diabetic chronic kidney disease: Secondary | ICD-10-CM

## 2019-12-09 DIAGNOSIS — K7460 Unspecified cirrhosis of liver: Secondary | ICD-10-CM

## 2019-12-09 DIAGNOSIS — C220 Liver cell carcinoma: Secondary | ICD-10-CM

## 2019-12-09 DIAGNOSIS — K7581 Nonalcoholic steatohepatitis (NASH): Secondary | ICD-10-CM

## 2019-12-09 DIAGNOSIS — N182 Chronic kidney disease, stage 2 (mild): Secondary | ICD-10-CM | POA: Diagnosis not present

## 2019-12-09 DIAGNOSIS — E785 Hyperlipidemia, unspecified: Secondary | ICD-10-CM

## 2019-12-09 DIAGNOSIS — D61818 Other pancytopenia: Secondary | ICD-10-CM | POA: Diagnosis not present

## 2019-12-09 DIAGNOSIS — M7989 Other specified soft tissue disorders: Secondary | ICD-10-CM

## 2019-12-09 DIAGNOSIS — E1169 Type 2 diabetes mellitus with other specified complication: Secondary | ICD-10-CM | POA: Diagnosis not present

## 2019-12-09 DIAGNOSIS — E559 Vitamin D deficiency, unspecified: Secondary | ICD-10-CM | POA: Diagnosis not present

## 2019-12-09 DIAGNOSIS — E039 Hypothyroidism, unspecified: Secondary | ICD-10-CM

## 2019-12-09 MED ORDER — LEVOTHYROXINE SODIUM 100 MCG PO TABS: ORAL_TABLET | ORAL | 1 refills | Status: DC

## 2019-12-09 MED ORDER — FUROSEMIDE 40 MG PO TABS: 20.0000 mg | ORAL_TABLET | Freq: Every day | ORAL | 4 refills | Status: DC

## 2019-12-09 MED ORDER — SPIRONOLACTONE 50 MG PO TABS: 50.0000 mg | ORAL_TABLET | Freq: Two times a day (BID) | ORAL | 4 refills | Status: DC

## 2019-12-09 NOTE — Progress Notes (Signed)
3 MONTH FOLLOW UP  Assessment:    Essential hypertension Continue medications Monitor blood pressure at home; call if consistently over 130/80 Continue DASH diet.   Reminder to go to the ER if any CP, SOB, nausea, dizziness, severe HA, changes vision/speech, left arm numbness and tingling and jaw pain.  Hepatocellular carcinoma ([place]) Followed by Dr. Marin Olp and Dr. Benson Norway  [facility]) Continue follow up  Hepatic Encephalopathy ([place]) Continue with lactulose Has labs routinely by cancer center  Type 2 diabetes mellitus with stage 2 chronic kidney disease, without long-term current use of insulin ([place]) Education: Reviewed ABCs of diabetes management (respective goals in parentheses):  A1C (<7), blood pressure (<130/80), and cholesterol (LDL <70) Eye Exam yearly and Dental Exam every 6 months. Dietary recommendations Physical Activity recommendations If next A1C remains well controlled D/c januvia due to cost burden  CKD 2 associated with T2DM (HCC) Increase fluids, avoid NSAIDS, monitor sugars, will monitor  Hyperlipidemia, associated with T2DM ([place]) Not on meds secondary to liver disease Continue low cholesterol diet and exercise.  Check lipid panel routinely, defer today after discussion   Hypothyroidism, unspecified type continue medications the same pending lab results reminded to take on an empty stomach 30-57mns before food.  check TSH level  Pancytopenia (HCC) CBC, followed by Dr. EMarin Olp Vitamin D deficiency At goal at recent check; continue to recommend supplementation for goal of 60-100 Defer vitamin D level  Medication management CBC, CMP/GFR, magnesium   Pulmonary nodule Recently imaged in 05/2019; oncology following  [place] X[place] d/[place] due to GI bleed; s/p IVC filter 03/18/2019; monitor   Recurrent R pleural effusion Following closely with oncology; monitor No concerning sx today  Notify office if progressive dyspnea; may  repeat imaging and consider need for repeat thoracentesis   Over 40 minutes of exam, counseling, chart review and critical decision making was performed Future Appointments  Date Time Provider DWellington 01/03/2020  2:30 PM [facility] LAB [facility] None  01/03/2020  2:45 PM [facility] INJ NURSE [facility] None  01/03/2020  3:00 PM Ennever, PRudell Cobb MD [facility] None  03/14/2020  2:30 PM MUnk Pinto MD [facility] None  05/18/2020  3:00 PM CLiane Comber NP [facility] None  09/05/2020  2:00 PM MUnk Pinto MD [facility] None     Subjective:  Beverly WARNKEis a 79y.o. female who presents for 3 month follow up.   Patient has remote hx/o Rt Breast cancer in 2008. 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 [facility] Nov [facility], she had Radio-embolization of a Rt Hepatic Lobe HCC, then followed in March 2018 with a thermal microwave ablation. She has history of asymptomatic portal vein hypertension.  Increase in AFP prompted an MRI liver that demonstrated reoccurence, she is s/p  CT-guided tissue ablation of right liver on 01/21/2018. She has been underoing Tecentriq/Avastin -- started on07/09/2018- s/p cycle8- Avastin d/c'ed due to thromboembolic disease of R popliteal/peroneal vein. She was on Xarelto 20 mg po q day -- stopped on 03/16/2019 due to GI bleed, IVC filter placed on 03/18/2019.    She follows with Dr. HBenson Norwayand EMarin Olp Also follows for incidental pulmonary nodules and pancytopenia. Recently with distended abdomen/ascites; also recurrent R pleural effusion; has repeat thoracentesis/paracentesis but recently doing well.  On med list she has lasix 20 mg daily, spironolactone 25 mg BID; today  she is very confused about both of these medications,   She is on lactulose due to hepatic encephalopathy, has diarrhea with occasional soiling with straining.  Wears pull ups. Couldn't afford xifaxan. She does have bedside commode to use but doesn't like do so.   BMI is Body mass index is 24.69 kg/m., she has not been working on diet and exercise. Wt Readings from Last 3 Encounters:  12/09/19 135 lb (61.2 kg)  11/26/19 132 lb 1.3 oz (59.9 kg)  11/02/19 131 lb (59.4 kg)   Today their BP is BP: 120/76 She does not workout. She denies chest pain, dizziness, dyspnea.    She is not on cholesterol medication due to her liver disease. Her cholesterol is not at goal though fairly controlled LDL at <100. The cholesterol last visit was:   Lab Results  Component Value Date   CHOL 151 03/24/2019   HDL 38 (L) 03/24/2019   LDLCALC 93 03/24/2019   TRIG 99 03/24/2019   CHOLHDL 4.0 03/24/2019   . She has been working on diet for T2 diabetes (A1C 9.4% in 2017) recently on metformin 2000 mg daily, on glipizide 2.5 mg TID with meals and denies foot ulcerations, hypoglycemia , increased appetite, nausea, paresthesia of the feet, polydipsia, polyuria, visual disturbances, vomiting and weight loss. She does check fasting blood sugars and was running 170s, today was 102. Last A1C in the office was:  Lab Results  Component Value Date   HGBA1C 6.2 (H) 09/02/2019   She has CKD 2 associated with T2DM monitored closely:  Lab Results  Component Value Date   GFRNONAA >60 11/26/2019   Patient is on Vitamin D supplement.   Lab Results  Component Value Date   VD25OH 77 09/02/2019     She is on thyroid medication. Her medication was changed last visit, was supposed to increase from 1/2 tab 112 mcg daily, to 100 mcg daily but patient never picked up Lab Results  Component Value Date   TSH 12.27 (H) 11/02/2019      Medication Review: Current Outpatient Medications on File Prior to Visit  Medication Sig Dispense Refill   Cholecalciferol (VITAMIN D3) 5000 units CAPS Take 5,000 Units by mouth daily.     cyanocobalamin 1000 MCG tablet Take 1,000 mcg by mouth  daily.     glipiZIDE (GLUCOTROL) 5 MG tablet Take 1/2 to 1 tablet 3 x /day with Meals for Diabetes 270 tablet 0   glucose blood (ONETOUCH ULTRA) test strip Check blood Sugar 3 x  /day before Meals 300 each 3   lactulose (CHRONULAC) 10 GM/15ML solution Take 30 mLs (20 g total) by mouth 3 (three) times daily. 240 mL 3   Lancets (ONETOUCH DELICA PLUS [id number]) MISC 300 Wafers by Does not apply route 3 (three) times daily before meals. Check blood sugar 3 x  /day 300 each 3   levothyroxine (SYNTHROID) 100 MCG tablet Take 1 tablet daily on an empty stomach with only water for 30 minutes & no Antacid meds, Calcium or Magnesium for 4 hours & avoid Biotin 90 tablet 1   magnesium oxide (MAG-OX) 400 MG tablet Take 400 mg by mouth daily.      metFORMIN (GLUCOPHAGE) 500 MG tablet Take 1 tablet 2 x /day with Breakfast & Lunch and 2 tablets with Supper for Diabetes 360 tablet 0   mirabegron ER (MYRBETRIQ) 25 MG TB24 tablet Take 1 tablet (25 mg total) by mouth at bedtime. 30 tablet 3   [DISCONTINUED] prochlorperazine (COMPAZINE) 10  MG tablet Take 1 tablet (10 mg total) by mouth every 6 (six) hours as needed (Nausea or vomiting). 30 tablet 1   No current facility-administered medications on file prior to visit.    Allergies  Allergen Reactions   Metformin And Related Other (See Comments)    Unknown Reaction   Welchol [Colesevelam Hcl] Other (See Comments)    Unknown Reaction    Current Problems (verified) Patient Active Problem List   Diagnosis Date Noted   Recurrent right pleural effusion 05/19/2019   DVT (deep venous thrombosis) ([employer]) 03/11/2019   Functional urinary incontinence 01/11/2019   Compression fracture of C1 vertebra ([place]) 01/07/2019   Murmur 01/04/2019   Goals of care, counseling/discussion 09/17/2018   History of hepatocellular carcinoma 07/29/2018   Liver cirrhosis secondary to NASH ([place]) 07/29/2018   FHx: heart disease 07/29/2018   B12 deficiency 07/29/2018    Pulmonary nodule 04/08/2018   Hepatoma ([place]) 01/21/2018   Poor compliance 11/26/2017   Hepatocellular carcinoma ([place]) 05/08/2016   Pancytopenia ([place])    Hepatic encephalopathy ([place]) 03/18/2016   Hyperlipidemia associated with type 2 diabetes mellitus ([place]) 12/14/2014   Hypothyroidism 05/18/2014   Essential hypertension 05/18/2014   Vitamin D deficiency 05/18/2014   Type 2 diabetes mellitus with stage 2 chronic kidney disease, without long-term current use of insulin ([place]) 01/12/2014   Osteoporosis    History of right breast cancer 09/30/2012    SURGICAL HISTORY She  has a past surgical history that includes Breast lumpectomy with axillary lymph node biopsy (Right, 06/25/2006); Cholecystectomy; Abdominal hysterectomy; Tubal ligation (1978); ir generic historical (03/28/2016); ir generic historical (05/01/2016); ir generic historical (05/01/2016); ir generic historical (05/01/2016); ir generic historical (05/01/2016); ir generic historical (05/01/2016); ir generic historical (05/01/2016); ir generic historical (05/01/2016); ir generic historical (05/01/2016); ir generic historical (05/01/2016); Radiofrequency ablation (N/A, 06/14/2016); IR Radiologist Eval & Mgmt (07/31/2016); IR Radiologist Eval & Mgmt (12/25/2016); Breast lumpectomy (Right, 2008); IR Radiologist Eval & Mgmt (12/10/2017); Radiology with anesthesia (Right, 01/21/2018); IR Radiologist Eval & Mgmt (04/21/2018); IR Radiologist Eval & Mgmt (08/14/2018); IR IMAGING GUIDED PORT INSERTION (09/28/2018); IR IVC FILTER PLMT / S&I /IMG GUID/MOD SED (03/18/2019); and IR THORACENTESIS ASP PLEURAL SPACE W/IMG GUIDE (04/08/2019).   FAMILY HISTORY Her family history includes Cancer in her father, mother, and paternal grandfather; Diabetes in her maternal aunt, sister, and sister; Heart Problems in her maternal grandfather; Hypertension in her sister; Liver disease in her brother.   SOCIAL HISTORY She  reports that she has never smoked. She has never used smokeless  tobacco. She reports that she does not drink alcohol and does not use drugs.   Review of Systems  Constitutional: Negative for malaise/fatigue and weight loss.  HENT: Negative for hearing loss and tinnitus.   Eyes: Negative for blurred vision and double vision.  Respiratory: Negative for cough, sputum production, shortness of breath and wheezing.   Cardiovascular: Negative for chest pain, palpitations, orthopnea, claudication, leg swelling and PND.  Gastrointestinal: Positive for diarrhea. Negative for abdominal pain, blood in stool, constipation, heartburn, melena, nausea and vomiting.  Genitourinary: Negative.   Musculoskeletal: Negative for falls, joint pain and myalgias.  Skin: Negative for rash.  Neurological: Negative for dizziness, tingling, sensory change, weakness and headaches.  Endo/Heme/Allergies: Negative for polydipsia.  Psychiatric/Behavioral: Negative.  Negative for depression, memory loss, substance abuse and suicidal ideas. The patient is not nervous/anxious and does not have insomnia.   All other systems reviewed and are negative.    Objective:     Today's Vitals  12/09/19 1430  BP: 120/76  Pulse: 72  Temp: 97.9 F (36.6 C)  SpO2: 98%  Weight: 135 lb (61.2 kg)   Body mass index is 24.69 kg/m.  General appearance: alert, no distress, WD/WN, female HEENT: normocephalic, sclerae anicteric, TMs pearly, nares patent, no discharge or erythema, pharynx normal Oral cavity: MMM, no lesions Neck: supple, no lymphadenopathy, no thyromegaly, no masses Heart: RRR, normal S1, S2, 2/6 systolic murmur without radiation, loudest over L 2nd ICS.  Lungs: CTA bilaterally, no wheezes, rhonchi, or rales;  Abdomen: +bs, rounded, firm, no fluid wave, no palpable masses or organomegaly Musculoskeletal: nontender, no obvious deformity, no notable edema in ankles today Extremities: no edema, no cyanosis, no clubbing Pulses: 2+ symmetric, upper and lower extremities, normal cap  refill Neurological: alert, oriented x 3, CN2-12 intact, strength normal upper extremities and lower extremities, sensation normal throughout, DTRs 2+ throughout, no cerebellar signs, gait is slow/stead. Slowed cognition/mental thought.  Psychiatric: normal affect, behavior normal, pleasant    Izora Ribas, NP   12/09/2019

## 2019-12-09 NOTE — Patient Instructions (Addendum)
Stop the 1/2 tab of 112 mcg levothyroxine -   Start new dose (sent in to pharmacy) of 100 mcg - 1 tab daily first thing in the morning, on an empty stomach with water only, nothing else for 30-45 min.   Once you are on the new medication, please throw out the old dose     Levothyroxine tablets What is this medicine? LEVOTHYROXINE (lee voe thye ROX een) is a thyroid hormone. This medicine can improve symptoms of thyroid deficiency such as slow speech, lack of energy, weight gain, hair loss, dry skin, and feeling cold. It also helps to treat goiter (an enlarged thyroid gland). It is also used to treat some kinds of thyroid cancer along with surgery and other medicines. This medicine may be used for other purposes; ask your health care provider or pharmacist if you have questions. COMMON BRAND NAME(S): Estre, Euthyrox, Levo-T, Levothroid, Levoxyl, Synthroid, Thyro-Tabs, Unithroid What should I tell my health care provider before I take this medicine? They need to know if you have any of these conditions:  Addison's disease or other adrenal gland problem  angina  bone problems  diabetes  dieting or on a weight loss program  fertility problems  heart disease  pituitary gland problem  take medicines that treat or prevent blood clots  an unusual or allergic reaction to levothyroxine, thyroid hormones, other medicines, foods, dyes, or preservatives  pregnant or trying to get pregnant  breast-feeding How should I use this medicine? Take this medicine by mouth with plenty of water. It is best to take on an empty stomach, at least 30 minutes to one hour before breakfast. Avoid taking antacids containing aluminum or magnesium, simethicone, bile acid sequestrants, calcium carbonate, sodium polystyrene sulfonate, ferrous sulfate, sevelamer, lanthanum, or sucralfate within 4 hours of taking this medicine. Follow the directions on the prescription label. Take at the same time each day. Do  not take your medicine more often than directed. Contact your pediatrician regarding the use of this medicine in children. While this drug may be prescribed for children and infants as young as a few days of age for selected conditions, precautions do apply. For infants, you may crush the tablet and place in a small amount of (5 to 10 mL or 1 to 2 teaspoonfuls) of water, breast milk, or non-soy based infant formula. Do not mix with soy-based infant formula. Give as directed. Overdosage: If you think you have taken too much of this medicine contact a poison control center or emergency room at once. NOTE: This medicine is only for you. Do not share this medicine with others. What if I miss a dose? If you miss a dose, take it as soon as you can. If it is almost time for your next dose, take only that dose. Do not take double or extra doses. What may interact with this medicine?  amiodarone  antacids  anti-thyroid medicines  calcium supplements  carbamazepine  certain medicines for depression  certain medicines to treat cancer  cholestyramine  clofibrate  colesevelam  colestipol  digoxin  female hormones, like estrogens and birth control pills, patches, rings, or injections  iron supplements  ketamine  lanthanum  liquid nutrition products like Ensure  lithium  medicines for colds and breathing difficulties  medicines for diabetes  medicines or dietary supplements for weight loss  methadone  niacin  orlistat  oxandrolone  phenobarbital or other barbiturates  phenytoin  rifampin  sevelamer  simethicone  sodium polystyrene sulfonate  soy  isoflavones  steroid medicines like prednisone or cortisone  sucralfate  testosterone  theophylline  warfarin This list may not describe all possible interactions. Give your health care provider a list of all the medicines, herbs, non-prescription drugs, or dietary supplements you use. Also tell them if you  smoke, drink alcohol, or use illegal drugs. Some items may interact with your medicine. What should I watch for while using this medicine? Be sure to take this medicine with plenty of fluids. Some tablets may cause choking, gagging, or difficulty swallowing from the tablet getting stuck in your throat. Most of these problems disappear if the medicine is taken with the right amount of water or other fluids. Do not switch brands of this medicine unless your health care professional agrees with the change. Ask questions if you are uncertain. You will need regular exams and occasional blood tests to check the response to treatment. If you are receiving this medicine for an underactive thyroid, it may be several weeks before you notice an improvement. Check with your doctor or health care professional if your symptoms do not improve. It may be necessary for you to take this medicine for the rest of your life. Do not stop using this medicine unless your doctor or health care professional advises you to. This medicine can affect blood sugar levels. If you have diabetes, check your blood sugar as directed. You may lose some of your hair when you first start treatment. With time, this usually corrects itself. If you are going to have surgery, tell your doctor or health care professional that you are taking this medicine. What side effects may I notice from receiving this medicine? Side effects that you should report to your doctor or health care professional as soon as possible:  allergic reactions like skin rash, itching or hives, swelling of the face, lips, or tongue  anxious  breathing problems  changes in menstrual periods  chest pain  diarrhea  excessive sweating or intolerance to heat  fast or irregular heartbeat  leg cramps  nervousness  swelling of ankles, feet, or legs  tremors  trouble sleeping  vomiting Side effects that usually do not require medical attention (report to  your doctor or health care professional if they continue or are bothersome):  changes in appetite  headache  irritable  nausea  weight loss This list may not describe all possible side effects. Call your doctor for medical advice about side effects. You may report side effects to FDA at 1-800-FDA-1088. Where should I keep my medicine? Keep out of the reach of children. Store at room temperature between 15 and 30 degrees C (59 and 86 degrees F). Protect from light and moisture. Keep container tightly closed. Throw away any unused medicine after the expiration date. NOTE: This sheet is a summary. It may not cover all possible information. If you have questions about this medicine, talk to your doctor, pharmacist, or health care provider.  2020 Elsevier/Gold Standard (2018-10-16 15:09:06)

## 2019-12-10 LAB — TSH: TSH: 3.99 mIU/L (ref 0.40–4.50)

## 2019-12-10 LAB — MAGNESIUM: Magnesium: 1.7 mg/dL (ref 1.5–2.5)

## 2019-12-10 LAB — HEMOGLOBIN A1C
Hgb A1c MFr Bld: 6.5 % of total Hgb — ABNORMAL HIGH (ref <5.7)
Mean Plasma Glucose: 140 (calc)
eAG (mmol/L): 7.7 (calc)

## 2019-12-14 ENCOUNTER — Other Ambulatory Visit: Payer: Self-pay

## 2019-12-14 DIAGNOSIS — C220 Liver cell carcinoma: Secondary | ICD-10-CM

## 2019-12-14 DIAGNOSIS — M7989 Other specified soft tissue disorders: Secondary | ICD-10-CM

## 2019-12-14 DIAGNOSIS — K7581 Nonalcoholic steatohepatitis (NASH): Secondary | ICD-10-CM

## 2019-12-14 MED ORDER — FUROSEMIDE 20 MG PO TABS: 20.0000 mg | ORAL_TABLET | Freq: Every day | ORAL | 2 refills | Status: DC

## 2019-12-14 MED ORDER — SPIRONOLACTONE 25 MG PO TABS
25.0000 mg | ORAL_TABLET | Freq: Two times a day (BID) | ORAL | 2 refills | Status: DC
Start: 2019-12-14 — End: 2019-12-20

## 2019-12-16 DIAGNOSIS — E113293 Type 2 diabetes mellitus with mild nonproliferative diabetic retinopathy without macular edema, bilateral: Secondary | ICD-10-CM | POA: Diagnosis not present

## 2019-12-16 DIAGNOSIS — H349 Unspecified retinal vascular occlusion: Secondary | ICD-10-CM | POA: Diagnosis not present

## 2019-12-16 DIAGNOSIS — H52203 Unspecified astigmatism, bilateral: Secondary | ICD-10-CM | POA: Diagnosis not present

## 2019-12-16 DIAGNOSIS — H25041 Posterior subcapsular polar age-related cataract, right eye: Secondary | ICD-10-CM | POA: Diagnosis not present

## 2019-12-20 ENCOUNTER — Other Ambulatory Visit: Payer: Self-pay | Admitting: Internal Medicine

## 2019-12-21 LAB — HM DIABETES EYE EXAM

## 2019-12-28 ENCOUNTER — Encounter: Payer: Self-pay | Admitting: Internal Medicine

## 2020-01-03 ENCOUNTER — Inpatient Hospital Stay: Payer: Medicare Other | Attending: Hematology & Oncology | Admitting: Hematology & Oncology

## 2020-01-03 ENCOUNTER — Other Ambulatory Visit: Payer: Self-pay

## 2020-01-03 ENCOUNTER — Inpatient Hospital Stay: Payer: Medicare Other

## 2020-01-03 ENCOUNTER — Encounter: Payer: Self-pay | Admitting: Hematology & Oncology

## 2020-01-03 VITALS — Wt 129.0 lb

## 2020-01-03 VITALS — BP 104/54 | HR 62 | Temp 98.2°F | Resp 18

## 2020-01-03 DIAGNOSIS — C220 Liver cell carcinoma: Secondary | ICD-10-CM | POA: Diagnosis not present

## 2020-01-03 DIAGNOSIS — Z170 Estrogen receptor positive status [ER+]: Secondary | ICD-10-CM | POA: Insufficient documentation

## 2020-01-03 DIAGNOSIS — Z853 Personal history of malignant neoplasm of breast: Secondary | ICD-10-CM | POA: Insufficient documentation

## 2020-01-03 DIAGNOSIS — K7581 Nonalcoholic steatohepatitis (NASH): Secondary | ICD-10-CM | POA: Insufficient documentation

## 2020-01-03 DIAGNOSIS — F0390 Unspecified dementia without behavioral disturbance: Secondary | ICD-10-CM | POA: Insufficient documentation

## 2020-01-03 DIAGNOSIS — K7460 Unspecified cirrhosis of liver: Secondary | ICD-10-CM | POA: Diagnosis not present

## 2020-01-03 DIAGNOSIS — Z95828 Presence of other vascular implants and grafts: Secondary | ICD-10-CM

## 2020-01-03 LAB — CMP (CANCER CENTER ONLY)
ALT: 48 U/L — ABNORMAL HIGH (ref 0–44)
AST: 60 U/L — ABNORMAL HIGH (ref 15–41)
Albumin: 3.4 g/dL — ABNORMAL LOW (ref 3.5–5.0)
Alkaline Phosphatase: 133 U/L — ABNORMAL HIGH (ref 38–126)
Anion gap: 9 (ref 5–15)
BUN: 16 mg/dL (ref 8–23)
CO2: 25 mmol/L (ref 22–32)
Calcium: 10.2 mg/dL (ref 8.9–10.3)
Chloride: 102 mmol/L (ref 98–111)
Creatinine: 0.8 mg/dL (ref 0.44–1.00)
GFR, Estimated: >60 mL/min (ref >60)
Glucose, Bld: 206 mg/dL — ABNORMAL HIGH (ref 70–99)
Potassium: 4.2 mmol/L (ref 3.5–5.1)
Sodium: 136 mmol/L (ref 135–145)
Total Bilirubin: 1.3 mg/dL — ABNORMAL HIGH (ref 0.3–1.2)
Total Protein: 6.7 g/dL (ref 6.5–8.1)

## 2020-01-03 LAB — CBC WITH DIFFERENTIAL (CANCER CENTER ONLY)
Abs Immature Granulocytes: 0.01 10*3/uL (ref 0.00–0.07)
Basophils Absolute: 0 10*3/uL (ref 0.0–0.1)
Basophils Relative: 1 %
Eosinophils Absolute: 0.1 10*3/uL (ref 0.0–0.5)
Eosinophils Relative: 3 %
HCT: 29.8 % — ABNORMAL LOW (ref 36.0–46.0)
Hemoglobin: 10 g/dL — ABNORMAL LOW (ref 12.0–15.0)
Immature Granulocytes: 0 %
Lymphocytes Relative: 23 %
Lymphs Abs: 0.7 10*3/uL (ref 0.7–4.0)
MCH: 36.6 pg — ABNORMAL HIGH (ref 26.0–34.0)
MCHC: 33.6 g/dL (ref 30.0–36.0)
MCV: 109.2 fL — ABNORMAL HIGH (ref 80.0–100.0)
Monocytes Absolute: 0.4 10*3/uL (ref 0.1–1.0)
Monocytes Relative: 13 %
Neutro Abs: 1.9 10*3/uL (ref 1.7–7.7)
Neutrophils Relative %: 60 %
Platelet Count: 102 10*3/uL — ABNORMAL LOW (ref 150–400)
RBC: 2.73 MIL/uL — ABNORMAL LOW (ref 3.87–5.11)
RDW: 14.7 % (ref 11.5–15.5)
WBC Count: 3.1 10*3/uL — ABNORMAL LOW (ref 4.0–10.5)
nRBC: 0 % (ref 0.0–0.2)

## 2020-01-03 LAB — AMMONIA: Ammonia: 140 umol/L — ABNORMAL HIGH (ref 9–35)

## 2020-01-03 MED ORDER — HEPARIN SOD (PORK) LOCK FLUSH 100 UNIT/ML IV SOLN
500.0000 [IU] | Freq: Once | INTRAVENOUS | Status: AC
  Administered 2020-01-03: 500 [IU] via INTRAVENOUS
  Filled 2020-01-03: qty 5

## 2020-01-03 MED ORDER — SODIUM CHLORIDE 0.9% FLUSH
10.0000 mL | Freq: Once | INTRAVENOUS | Status: AC
  Administered 2020-01-03: 10 mL via INTRAVENOUS
  Filled 2020-01-03: qty 10

## 2020-01-03 NOTE — Patient Instructions (Signed)

## 2020-01-03 NOTE — Progress Notes (Signed)
Hematology and Oncology Follow Up Visit  Beverly Davis [id number] March 25, 1941 79 y.o. 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 Overall, she is about the same. There are occasions where she has not taken the lactulose. When she does not take the lactulose, her ammonia goes quite high and she gets confused. Thankfully, her family helps her out.  Her son came in with her today. He really helps out.  She has had no problems with pain.  About a week or so ago she had some bleeding. This appeared to be bleeding from the vaginal area. Is stopped after 3 days. She could have bled all that much has her hemoglobin is higher than in the past.  She has had no problems with diarrhea. I think that this might be a little bit of an issue with the lactulose.  I told her that she had to take both of the Lasix and the Aldactone.  She has had no cough or shortness of breath.  She does have the dementia. Again, her son has helped out quite a bit.  She has little bit of leg swelling which is no surprise.  Her last alpha-fetoprotein was 660.  Her appetite is okay. She has had no nausea or vomiting.  Overall, her performance status is ECOG 2-3.   Medications:  Allergies as of 01/03/2020      Reactions   Metformin And Related Other (See Comments)   Unknown Reaction   Welchol [colesevelam Hcl] Other (See Comments)   Unknown  Reaction      Medication List       Accurate as of January 03, 2020  4:01 PM. If you have any questions, ask your nurse or doctor.        Cal Mag Zinc +D3 Tabs Take 1 tablet by mouth daily.   cyanocobalamin 1000 MCG tablet Take 1,000 mcg by mouth daily.   Durezol 0.05 % Emul Generic drug: Difluprednate   furosemide 20 MG tablet Commonly known as: LASIX Take 1 tablet (20 mg total) by mouth daily.   glipiZIDE 5 MG tablet Commonly known as: GLUCOTROL Take 1/2 to 1 tablet 3 x /day with Meals for Diabetes   lactulose 10 GM/15ML solution Commonly known as: CHRONULAC Take 30 mLs (20 g total) by mouth 3 (three) times daily.   levothyroxine 100 MCG tablet Commonly known as: Synthroid Take 1 tablet daily on an empty stomach with only water for 30 minutes & no Antacid meds, Calcium or Magnesium for 4 hours & avoid Biotin   magnesium oxide 400 MG tablet Commonly known as: MAG-OX Take 400 mg by mouth daily.   metFORMIN 500 MG tablet Commonly known as: GLUCOPHAGE Take 1 tablet 2 x /day with Breakfast & Lunch and 2 tablets with Supper for Diabetes   mirabegron ER 25 MG Tb24 tablet Commonly known as: Myrbetriq Take 1 tablet (25 mg total) by mouth at bedtime.   moxifloxacin 0.5 % ophthalmic solution Commonly known as: VIGAMOX   OneTouch Delica Plus  Lancet33G Misc 300 Wafers by Does not apply route 3 (three) times daily before meals. Check blood sugar 3 x  /day   OneTouch Ultra test strip Generic drug: glucose blood Check blood Sugar 3 x  /day before Meals   Vitamin D3 125 MCG (5000 UT) Caps Take 5,000 Units by mouth daily.       Allergies:  Allergies  Allergen Reactions  . Metformin And Related Other (See Comments)    Unknown Reaction  . Welchol [Colesevelam Hcl] Other (See Comments)    Unknown Reaction    Past Medical History, Surgical history, Social history, and Family History were reviewed and updated.  Review of Systems: Review of Systems    Constitutional: Negative.   HENT: Negative.   Eyes: Negative.   Respiratory: Positive for shortness of breath.   Cardiovascular: Positive for palpitations.  Gastrointestinal: Positive for heartburn and nausea.  Genitourinary: Negative.   Musculoskeletal: Negative.   Skin: Negative.   Neurological: Negative.   Endo/Heme/Allergies: Negative.   Psychiatric/Behavioral: Negative.      Physical Exam:  weight is 129 lb (58.5 kg).   Wt Readings from Last 3 Encounters:  01/03/20 129 lb (58.5 kg)  12/09/19 135 lb (61.2 kg)  11/26/19 132 lb 1.3 oz (59.9 kg)    Physical Exam Vitals reviewed.  HENT:     Head: Normocephalic and atraumatic.  Eyes:     Pupils: Pupils are equal, round, and reactive to light.  Cardiovascular:     Rate and Rhythm: Normal rate and regular rhythm.     Heart sounds: Normal heart sounds.  Pulmonary:     Effort: Pulmonary effort is normal.     Breath sounds: Normal breath sounds.  Abdominal:     General: Bowel sounds are normal.     Palpations: Abdomen is soft.  Musculoskeletal:        General: No tenderness or deformity. Normal range of motion.     Cervical back: Normal range of motion.  Lymphadenopathy:     Cervical: No cervical adenopathy.  Skin:    General: Skin is warm and dry.     Findings: No erythema or rash.  Neurological:     Mental Status: She is alert and oriented to person, place, and time.  Psychiatric:        Behavior: Behavior normal.        Thought Content: Thought content normal.        Judgment: Judgment normal.      Lab Results  Component Value Date   WBC 3.1 (L) 01/03/2020   HGB 10.0 (L) 01/03/2020   HCT 29.8 (L) 01/03/2020   MCV 109.2 (H) 01/03/2020   PLT 102 (L) 01/03/2020   Lab Results  Component Value Date   FERRITIN 84 03/17/2019   IRON 72 03/17/2019   TIBC 311 03/17/2019   UIBC 239 03/17/2019   IRONPCTSAT 23 03/17/2019   Lab Results  Component Value Date   RETICCTPCT 1.2 09/05/2015   RBC 2.73 (L)  01/03/2020   RETICCTABS 40,080 09/05/2015   No results found for: KPAFRELGTCHN, LAMBDASER, KAPLAMBRATIO No results found for: Kandis Cocking, IGMSERUM Lab Results  Component Value Date   TOTALPROTELP 7.1 02/03/2008   ALBUMINELP 54.8 (L) 02/03/2008   A1GS 4.2 02/03/2008   A2GS 9.5 02/03/2008   BETS 8.1 (H) 02/03/2008   BETA2SER 5.8 02/03/2008   GAMS 17.6 02/03/2008   MSPIKE NOT DET 02/03/2008   SPEI * 02/03/2008     Chemistry  Component Value Date/Time   NA 136 01/03/2020 1450   NA 139 01/01/2017 1034   K 4.2 01/03/2020 1450   K 4.1 01/01/2017 1034   CL 102 01/03/2020 1450   CL 107 01/01/2017 1034   CO2 25 01/03/2020 1450   CO2 29 01/01/2017 1034   BUN 16 01/03/2020 1450   BUN 9 01/01/2017 1034   CREATININE 0.80 01/03/2020 1450   CREATININE 0.75 09/02/2019 1544      Component Value Date/Time   CALCIUM 10.2 01/03/2020 1450   CALCIUM 9.4 01/01/2017 1034   ALKPHOS 133 (H) 01/03/2020 1450   ALKPHOS 81 01/01/2017 1034   AST 60 (H) 01/03/2020 1450   ALT 48 (H) 01/03/2020 1450   ALT 48 (H) 01/01/2017 1034   BILITOT 1.3 (H) 01/03/2020 1450       Impression and Plan: Beverly Davis is a very sweet 79 year old white female.  She has NASH cirrhosis with hepatocellular carcinoma.  She has had progressive disease.  We are just maintaining her and focusing on her quality of life.  I am just happy that her quality of life is doing fairly well right now.  This is certainly a positive for her. I does want to make sure that she is able to have some function in her life. I know this is important for her.  I will plan to see her back in 6 weeks. I would like to get her back right before Thanksgiving.   [name], MD 10/11/20214:01 PM

## 2020-01-04 LAB — AFP TUMOR MARKER: AFP, Serum, Tumor Marker: 594 ng/mL — ABNORMAL HIGH (ref 0.0–8.3)

## 2020-01-06 ENCOUNTER — Other Ambulatory Visit: Payer: Self-pay

## 2020-01-06 ENCOUNTER — Ambulatory Visit (INDEPENDENT_AMBULATORY_CARE_PROVIDER_SITE_OTHER): Payer: PPO

## 2020-01-06 ENCOUNTER — Other Ambulatory Visit: Payer: Self-pay | Admitting: Adult Health

## 2020-01-06 VITALS — Temp 97.5°F

## 2020-01-06 DIAGNOSIS — Z23 Encounter for immunization: Secondary | ICD-10-CM | POA: Diagnosis not present

## 2020-01-12 DIAGNOSIS — C229 Malignant neoplasm of liver, not specified as primary or secondary: Secondary | ICD-10-CM | POA: Diagnosis not present

## 2020-01-12 DIAGNOSIS — K7290 Hepatic failure, unspecified without coma: Secondary | ICD-10-CM | POA: Diagnosis not present

## 2020-01-12 DIAGNOSIS — K7460 Unspecified cirrhosis of liver: Secondary | ICD-10-CM | POA: Diagnosis not present

## 2020-01-18 DIAGNOSIS — H25811 Combined forms of age-related cataract, right eye: Secondary | ICD-10-CM | POA: Diagnosis not present

## 2020-01-18 DIAGNOSIS — H2511 Age-related nuclear cataract, right eye: Secondary | ICD-10-CM | POA: Diagnosis not present

## 2020-01-18 DIAGNOSIS — H25041 Posterior subcapsular polar age-related cataract, right eye: Secondary | ICD-10-CM | POA: Diagnosis not present

## 2020-02-09 ENCOUNTER — Telehealth: Payer: Self-pay | Admitting: *Deleted

## 2020-02-09 NOTE — Telephone Encounter (Signed)
Message received from patient requesting a call back.  Call placed back to patient and patient had questions about the times of her appts for tomorrow.  Pt.'s appts reviewed with her and pt states that those times will work for her.  Pt appreciative of assistance and has no further questions at this time.

## 2020-02-10 ENCOUNTER — Telehealth: Payer: Self-pay

## 2020-02-10 ENCOUNTER — Inpatient Hospital Stay: Payer: Medicare Other

## 2020-02-10 ENCOUNTER — Other Ambulatory Visit: Payer: Self-pay | Admitting: *Deleted

## 2020-02-10 ENCOUNTER — Inpatient Hospital Stay: Payer: Medicare Other | Attending: Hematology & Oncology | Admitting: Hematology & Oncology

## 2020-02-10 ENCOUNTER — Other Ambulatory Visit: Payer: Self-pay

## 2020-02-10 VITALS — BP 114/52 | HR 71 | Temp 98.6°F | Resp 18 | Ht 62.0 in | Wt 125.0 lb

## 2020-02-10 DIAGNOSIS — Z170 Estrogen receptor positive status [ER+]: Secondary | ICD-10-CM | POA: Diagnosis not present

## 2020-02-10 DIAGNOSIS — K7581 Nonalcoholic steatohepatitis (NASH): Secondary | ICD-10-CM | POA: Insufficient documentation

## 2020-02-10 DIAGNOSIS — K7460 Unspecified cirrhosis of liver: Secondary | ICD-10-CM | POA: Insufficient documentation

## 2020-02-10 DIAGNOSIS — Z853 Personal history of malignant neoplasm of breast: Secondary | ICD-10-CM | POA: Diagnosis not present

## 2020-02-10 DIAGNOSIS — K7682 Hepatic encephalopathy: Secondary | ICD-10-CM

## 2020-02-10 DIAGNOSIS — K7290 Hepatic failure, unspecified without coma: Secondary | ICD-10-CM

## 2020-02-10 DIAGNOSIS — C220 Liver cell carcinoma: Secondary | ICD-10-CM | POA: Diagnosis not present

## 2020-02-10 DIAGNOSIS — Z79899 Other long term (current) drug therapy: Secondary | ICD-10-CM | POA: Diagnosis not present

## 2020-02-10 DIAGNOSIS — Z95828 Presence of other vascular implants and grafts: Secondary | ICD-10-CM

## 2020-02-10 LAB — CBC WITH DIFFERENTIAL (CANCER CENTER ONLY)
Abs Immature Granulocytes: 0 10*3/uL (ref 0.00–0.07)
Basophils Absolute: 0 10*3/uL (ref 0.0–0.1)
Basophils Relative: 1 %
Eosinophils Absolute: 0.1 10*3/uL (ref 0.0–0.5)
Eosinophils Relative: 3 %
HCT: 29.2 % — ABNORMAL LOW (ref 36.0–46.0)
Hemoglobin: 9.8 g/dL — ABNORMAL LOW (ref 12.0–15.0)
Immature Granulocytes: 0 %
Lymphocytes Relative: 18 %
Lymphs Abs: 0.5 10*3/uL — ABNORMAL LOW (ref 0.7–4.0)
MCH: 36.4 pg — ABNORMAL HIGH (ref 26.0–34.0)
MCHC: 33.6 g/dL (ref 30.0–36.0)
MCV: 108.6 fL — ABNORMAL HIGH (ref 80.0–100.0)
Monocytes Absolute: 0.4 10*3/uL (ref 0.1–1.0)
Monocytes Relative: 13 %
Neutro Abs: 1.8 10*3/uL (ref 1.7–7.7)
Neutrophils Relative %: 65 %
Platelet Count: 99 10*3/uL — ABNORMAL LOW (ref 150–400)
RBC: 2.69 MIL/uL — ABNORMAL LOW (ref 3.87–5.11)
RDW: 14.4 % (ref 11.5–15.5)
WBC Count: 2.7 10*3/uL — ABNORMAL LOW (ref 4.0–10.5)
nRBC: 0 % (ref 0.0–0.2)

## 2020-02-10 LAB — CMP (CANCER CENTER ONLY)
ALT: 43 U/L (ref 0–44)
AST: 52 U/L — ABNORMAL HIGH (ref 15–41)
Albumin: 3.2 g/dL — ABNORMAL LOW (ref 3.5–5.0)
Alkaline Phosphatase: 141 U/L — ABNORMAL HIGH (ref 38–126)
Anion gap: 9 (ref 5–15)
BUN: 16 mg/dL (ref 8–23)
CO2: 22 mmol/L (ref 22–32)
Calcium: 9.3 mg/dL (ref 8.9–10.3)
Chloride: 103 mmol/L (ref 98–111)
Creatinine: 0.76 mg/dL (ref 0.44–1.00)
GFR, Estimated: >60 mL/min (ref >60)
Glucose, Bld: 292 mg/dL — ABNORMAL HIGH (ref 70–99)
Potassium: 3.7 mmol/L (ref 3.5–5.1)
Sodium: 134 mmol/L — ABNORMAL LOW (ref 135–145)
Total Bilirubin: 1.1 mg/dL (ref 0.3–1.2)
Total Protein: 6.7 g/dL (ref 6.5–8.1)

## 2020-02-10 LAB — LACTATE DEHYDROGENASE: LDH: 198 U/L — ABNORMAL HIGH (ref 98–192)

## 2020-02-10 LAB — PREALBUMIN: Prealbumin: 9.3 mg/dL — ABNORMAL LOW (ref 18–38)

## 2020-02-10 MED ORDER — SODIUM CHLORIDE 0.9% FLUSH
10.0000 mL | Freq: Once | INTRAVENOUS | Status: AC
  Administered 2020-02-10: 10 mL via INTRAVENOUS
  Filled 2020-02-10: qty 10

## 2020-02-10 MED ORDER — HEPARIN SOD (PORK) LOCK FLUSH 100 UNIT/ML IV SOLN
500.0000 [IU] | Freq: Once | INTRAVENOUS | Status: AC
  Administered 2020-02-10: 500 [IU] via INTRAVENOUS
  Filled 2020-02-10: qty 5

## 2020-02-10 NOTE — Patient Instructions (Signed)
Tunneled Central Venous Catheter Flushing Guide  It is important to flush your tunneled central venous catheter each time you use it, both before and after you use it. Flushing your catheter will help prevent it from clogging. What are the risks? Risks may include:  Infection.  Air getting into the catheter and bloodstream. Supplies needed:  A clean pair of gloves.  A disinfecting wipe. Use an alcohol wipe, chlorhexidine wipe, or iodine wipe as told by your health care provider.  A 10 mL syringe that has been prefilled with saline solution.  An empty 10 mL syringe, if a substance called heparin was injected into your catheter. How to flush your catheter When you flush your catheter, make sure you follow any specific instructions from your health care provider or the manufacturer. These are general guidelines. Flushing your catheter before use If there is heparin in your catheter: 1. Wash your hands with soap and water. 2. Put on gloves. 3. Scrub the injection cap for a minimum of 15 seconds with a disinfecting wipe. 4. Unclamp the catheter. 5. Attach the empty syringe to the injection cap. 6. Pull the syringe plunger back and withdraw 10 mL of blood. 7. Place the syringe into an appropriate waste container. 8. Scrub the injection cap for 15 seconds with a disinfecting wipe. 9. Attach the prefilled syringe to the injection cap. 10. Flush the catheter by pushing the plunger forward until all the liquid from the syringe is in the catheter. 11. Remove the syringe from the injection cap. 12. Clamp the catheter. If there is no heparin in your catheter: 1. Wash your hands with soap and water. 2. Put on gloves. 3. Scrub the injection cap for 15 seconds with a disinfecting wipe. 4. Unclamp the catheter. 5. Attach the prefilled syringe to the injection cap. 6. Flush the catheter by pushing the plunger forward until 5 mL of the liquid from the syringe is in the catheter. 7. Pull back on  the syringe until you see blood in the catheter. 8. If you have been asked to collect any blood, follow your health care provider's instructions. Otherwise, flush the catheter with the rest of the solution from the syringe. 9. Remove the syringe from the injection cap. 10. Clamp the catheter.  Flushing your catheter after use 1. Wash your hands with soap and water. 2. Put on gloves. 3. Scrub the injection cap for 15 seconds with a disinfecting wipe. 4. Unclamp the catheter. 5. Attach the prefilled syringe to the injection cap. 6. Flush the catheter by pushing the plunger forward until all of the liquid from the syringe is in the catheter. 7. Remove the syringe from the injection cap. 8. Clamp the catheter. Problems and solutions  If blood cannot be completely cleared from the injection cap, you may need to have the injection cap replaced.  If the catheter is difficult to flush, use the pulsing method. The pulsing method involves pushing only a few milliliters of solution into the catheter at a time and pausing between pushes.  If you do not see blood in the catheter when you pull back on the syringe, change your body position, such as by raising your arms above your head. Take a deep breath and cough. Then, pull back on the syringe. If you still do not see blood, flush the catheter with a small amount of solution. Then, change positions again and take a breath or cough. Pull back on the syringe again. If you still do not see  blood, finish flushing the catheter and contact your health care provider. Do not use your catheter until your health care provider says it is okay. General tips  Have someone help you flush your catheter, if possible.  Do not force fluid through your catheter.  Do not use a syringe that is larger or smaller than 10 mL. Using a smaller syringe can make the catheter burst.  Do not use your catheter without flushing it first if it has heparin in it. Contact a health  care provider if:  You cannot see any blood in the catheter when you flush it before using it.  Your catheter is difficult to flush. Get help right away if:  You cannot flush the catheter.  The catheter leaks when you flush it or when there is fluid in it.  There are cracks or breaks in the catheter. Summary  It is important to flush your tunneled central venous catheter each time you use it, both before and after you use it.  Scrub the injection cap for 15 seconds with a disinfecting wipe before and after you flush it.  When you flush your catheter, make sure you follow any specific instructions from your health care provider or the manufacturer.  Get help right away if you cannot flush the catheter. This information is not intended to replace advice given to you by your health care provider. Make sure you discuss any questions you have with your health care provider. Document Revised: 12/04/2018 Document Reviewed: 05/27/2018 Elsevier Patient Education  [place].

## 2020-02-10 NOTE — Progress Notes (Signed)
Hematology and Oncology Follow Up Visit  Beverly Davis [id number] 08-07-1940 79 y.o. 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 is here today for a follow-up.  Thankfully, her son comes in with her.  He is a Technical brewer in the [employer] office.  She seems to be holding her own.  She was not taking lactulose last time we saw her.  Her ammonia was 140.  I think she may have had some change in mental status after we saw her.  She says she is now is taking the lactulose.  She is not have any problems with pain.  She does have some dementia.  This does not appear to be any worse.  She has had no obvious bleeding.  She has occasional diarrhea, likely from the lactulose.  She has had no problems with nausea or vomiting.  Surprisingly, there is no leg swelling.  Everything is all connected to her diabetes.  Her blood sugar today was 292.  I just am not sure how well this is being controlled.  Insulin certainly would not be easy for her to take given the dementia.  Her last alpha-fetoprotein was 594.  We will have to see what it is this time.  She has not had an MRI of the liver for a few months.  I really think we have to get this so we can see how everything looks with her hepatocellular carcinoma.  Hopefully, she will have a nice Thanksgiving with her family.  Currently, her performance status is ECOG 2 at best.      Medications:  Allergies as of  02/10/2020      Reactions   Metformin And Related Other (See Comments)   Unknown Reaction   Welchol [colesevelam Hcl] Other (See Comments)   Unknown Reaction      Medication List       Accurate as of February 10, 2020  4:38 PM. If you have any questions, ask your nurse or doctor.        Cal Mag Zinc +D3 Tabs Take 1 tablet by mouth daily.   cyanocobalamin 1000 MCG tablet Take 1,000 mcg by mouth daily.   Durezol 0.05 % Emul Generic drug: Difluprednate   furosemide 20 MG tablet Commonly known as: LASIX Take 1 tablet (20 mg total) by mouth daily.   glipiZIDE 5 MG tablet Commonly known as: GLUCOTROL Take 1/2 to 1 tablet 3 x /day with Meals for Diabetes   lactulose 10 GM/15ML solution Commonly known as: CHRONULAC Take 30 mLs (20 g total) by mouth 3 (three) times daily.   levothyroxine 100 MCG tablet Commonly known as: Synthroid Take 1 tablet daily on an empty stomach with only water for 30 minutes & no Antacid meds, Calcium or Magnesium for 4 hours & avoid Biotin   magnesium oxide 400 MG tablet Commonly known as: MAG-OX Take 400 mg by mouth daily.   metFORMIN 500 MG tablet Commonly known as: GLUCOPHAGE Take 1 tablet 2 x /day with Breakfast & Lunch and 2 tablets with Supper for  Diabetes   mirabegron ER 25 MG Tb24 tablet Commonly known as: Myrbetriq Take 1 tablet (25 mg total) by mouth at bedtime.   moxifloxacin 0.5 % ophthalmic solution Commonly known as: VIGAMOX   OneTouch Delica Plus [id number] Misc 300 Wafers by Does not apply route 3 (three) times daily before meals. Check blood sugar 3 x  /day   OneTouch Ultra test strip Generic drug: glucose blood Check blood Sugar 3 x  /day before Meals   Vitamin D3 125 MCG (5000 UT) Caps Take 5,000 Units by mouth daily.       Allergies:  Allergies  Allergen Reactions  . Metformin And Related Other (See Comments)    Unknown Reaction  . Welchol [Colesevelam Hcl] Other (See Comments)    Unknown Reaction     Past Medical History, Surgical history, Social history, and Family History were reviewed and updated.  Review of Systems: Review of Systems  Constitutional: Negative.   HENT: Negative.   Eyes: Negative.   Respiratory: Positive for shortness of breath.   Cardiovascular: Positive for palpitations.  Gastrointestinal: Positive for heartburn and nausea.  Genitourinary: Negative.   Musculoskeletal: Negative.   Skin: Negative.   Neurological: Negative.   Endo/Heme/Allergies: Negative.   Psychiatric/Behavioral: Negative.      Physical Exam:  vitals were not taken for this visit.   Wt Readings from Last 3 Encounters:  02/10/20 125 lb (56.7 kg)  01/03/20 129 lb (58.5 kg)  12/09/19 135 lb (61.2 kg)    Physical Exam Vitals reviewed.  HENT:     Head: Normocephalic and atraumatic.  Eyes:     Pupils: Pupils are equal, round, and reactive to light.  Cardiovascular:     Rate and Rhythm: Normal rate and regular rhythm.     Heart sounds: Normal heart sounds.  Pulmonary:     Effort: Pulmonary effort is normal.     Breath sounds: Normal breath sounds.  Abdominal:     General: Bowel sounds are normal.     Palpations: Abdomen is soft.  Musculoskeletal:        General: No tenderness or deformity. Normal range of motion.     Cervical back: Normal range of motion.  Lymphadenopathy:     Cervical: No cervical adenopathy.  Skin:    General: Skin is warm and dry.     Findings: No erythema or rash.  Neurological:     Mental Status: She is alert and oriented to person, place, and time.  Psychiatric:        Behavior: Behavior normal.        Thought Content: Thought content normal.        Judgment: Judgment normal.      Lab Results  Component Value Date   WBC 2.7 (L) 02/10/2020   HGB 9.8 (L) 02/10/2020   HCT 29.2 (L) 02/10/2020   MCV 108.6 (H) 02/10/2020   PLT 99 (L) 02/10/2020   Lab Results  Component Value Date   FERRITIN 84 03/17/2019   IRON 72 03/17/2019   TIBC 311  03/17/2019   UIBC 239 03/17/2019   IRONPCTSAT 23 03/17/2019   Lab Results  Component Value Date   RETICCTPCT 1.2 09/05/2015   RBC 2.69 (L) 02/10/2020   RETICCTABS 40,080 09/05/2015   No results found for: KPAFRELGTCHN, LAMBDASER, KAPLAMBRATIO No results found for: Kandis Cocking, IGMSERUM Lab Results  Component Value Date   TOTALPROTELP 7.1 02/03/2008   ALBUMINELP 54.8 (L) 02/03/2008   A1GS 4.2 02/03/2008   A2GS 9.5 02/03/2008  BETS 8.1 (H) 02/03/2008   BETA2SER 5.8 02/03/2008   GAMS 17.6 02/03/2008   MSPIKE NOT DET 02/03/2008   SPEI * 02/03/2008     Chemistry      Component Value Date/Time   NA 134 (L) 02/10/2020 1445   NA 139 01/01/2017 1034   K 3.7 02/10/2020 1445   K 4.1 01/01/2017 1034   CL 103 02/10/2020 1445   CL 107 01/01/2017 1034   CO2 22 02/10/2020 1445   CO2 29 01/01/2017 1034   BUN 16 02/10/2020 1445   BUN 9 01/01/2017 1034   CREATININE 0.76 02/10/2020 1445   CREATININE 0.75 09/02/2019 1544      Component Value Date/Time   CALCIUM 9.3 02/10/2020 1445   CALCIUM 9.4 01/01/2017 1034   ALKPHOS 141 (H) 02/10/2020 1445   ALKPHOS 81 01/01/2017 1034   AST 52 (H) 02/10/2020 1445   ALT 43 02/10/2020 1445   ALT 48 (H) 01/01/2017 1034   BILITOT 1.1 02/10/2020 1445      Impression and Plan: Ms. Mahlum is a very sweet 79 year old white female.  She has NASH cirrhosis with hepatocellular carcinoma.  Again, her diabetes is not all that well controlled.  I am sure a lot of this is because of the patient herself and her likely dementia issues.  We have to see where she stands with the liver cancer.  We will see what her alpha-fetoprotein shows.  Again everything is all about quality of life.  We just want her to have a good quality of life so she can enjoy herself and her family, particularly the holidays.  We will see what her ammonia level is.  I would like to think that the ammonia will be better if she has taken the lactulose.  We will plan to get her back  in 4 weeks.  It would be nice to see her before Christmas to make sure that she is in good shape.    [name], MD 11/18/20214:38 PM

## 2020-02-10 NOTE — Telephone Encounter (Signed)
appts made and printed for pt, aware tjat we will call with MRI once approved... AOM

## 2020-02-11 LAB — MISC LABCORP TEST (SEND OUT): Labcorp test code: 7054

## 2020-02-26 ENCOUNTER — Other Ambulatory Visit: Payer: Self-pay

## 2020-02-26 ENCOUNTER — Ambulatory Visit (HOSPITAL_BASED_OUTPATIENT_CLINIC_OR_DEPARTMENT_OTHER)
Admission: RE | Admit: 2020-02-26 | Discharge: 2020-02-26 | Disposition: A | Discharge status: Home / Self Care | Payer: PPO | Source: Ambulatory Visit | Attending: Hematology & Oncology | Admitting: Hematology & Oncology

## 2020-02-26 DIAGNOSIS — C220 Liver cell carcinoma: Secondary | ICD-10-CM | POA: Insufficient documentation

## 2020-02-26 DIAGNOSIS — K766 Portal hypertension: Secondary | ICD-10-CM | POA: Diagnosis not present

## 2020-02-26 DIAGNOSIS — I864 Gastric varices: Secondary | ICD-10-CM | POA: Diagnosis not present

## 2020-02-26 DIAGNOSIS — K7460 Unspecified cirrhosis of liver: Secondary | ICD-10-CM | POA: Diagnosis not present

## 2020-02-26 MED ORDER — GADOBUTROL 1 MMOL/ML IV SOLN
6.0000 mL | Freq: Once | INTRAVENOUS | Status: AC | PRN
  Administered 2020-02-26: 6 mL via INTRAVENOUS

## 2020-02-28 ENCOUNTER — Telehealth: Payer: Self-pay | Admitting: *Deleted

## 2020-02-28 NOTE — Telephone Encounter (Signed)
Received a call from [name], patients son, explaining that patient had to come off of Hospice to get an MRI on Saturday.  He was unaware as patient didn't tell him this.  Patient to see Dr Marin Olp 03/16/2020.  Will stay on Palliative care until then.  MRI reviewed with Dr Marin Olp. [name] told to call us if patient condition worsens or if additional needs arise.  Jamie in agreement.

## 2020-02-28 NOTE — Telephone Encounter (Signed)
Received word from [facility] that patient has revoked Hospice services.  She wanted to have an MRI and potential Treatment.  Will continue with palliative care though.  Dr Marin Olp notified.

## 2020-02-29 ENCOUNTER — Telehealth: Payer: Self-pay | Admitting: *Deleted

## 2020-02-29 NOTE — Telephone Encounter (Signed)
Dr Marin Olp reviewed patients MRI and recommends patient stay on Hospice Services.  Lorenda Cahill her son who discussed with his wife.  In agreement to go back on Hospice.  Sherri at [facility] called to restart Hospice services.

## 2020-03-01 ENCOUNTER — Telehealth: Payer: Self-pay

## 2020-03-01 NOTE — Telephone Encounter (Signed)
Beverly Davis, from [place] called about hospice referral, wanted to make sure Dr.Ennever could still be attending for patient during her hospice care. Informed her he would be the attending. She verbalized and denies any other questions or concerns at this time.

## 2020-03-04 ENCOUNTER — Other Ambulatory Visit: Payer: Self-pay | Admitting: Internal Medicine

## 2020-03-14 ENCOUNTER — Ambulatory Visit: Payer: PPO | Admitting: Internal Medicine

## 2020-03-16 ENCOUNTER — Other Ambulatory Visit: Payer: Self-pay

## 2020-03-16 ENCOUNTER — Telehealth: Payer: Self-pay | Admitting: Hematology & Oncology

## 2020-03-16 ENCOUNTER — Inpatient Hospital Stay: Payer: Medicare Other

## 2020-03-16 ENCOUNTER — Inpatient Hospital Stay (HOSPITAL_BASED_OUTPATIENT_CLINIC_OR_DEPARTMENT_OTHER): Payer: Medicare Other | Admitting: Hematology & Oncology

## 2020-03-16 ENCOUNTER — Inpatient Hospital Stay: Payer: Medicare Other | Attending: Hematology & Oncology

## 2020-03-16 ENCOUNTER — Encounter: Payer: Self-pay | Admitting: Hematology & Oncology

## 2020-03-16 ENCOUNTER — Telehealth: Payer: Self-pay | Admitting: *Deleted

## 2020-03-16 VITALS — BP 117/55 | HR 75 | Temp 97.6°F | Resp 18 | Ht 62.0 in | Wt 126.1 lb

## 2020-03-16 DIAGNOSIS — C220 Liver cell carcinoma: Secondary | ICD-10-CM | POA: Insufficient documentation

## 2020-03-16 DIAGNOSIS — Z452 Encounter for adjustment and management of vascular access device: Secondary | ICD-10-CM | POA: Diagnosis not present

## 2020-03-16 DIAGNOSIS — Z170 Estrogen receptor positive status [ER+]: Secondary | ICD-10-CM | POA: Diagnosis not present

## 2020-03-16 DIAGNOSIS — R197 Diarrhea, unspecified: Secondary | ICD-10-CM | POA: Insufficient documentation

## 2020-03-16 DIAGNOSIS — R410 Disorientation, unspecified: Secondary | ICD-10-CM | POA: Insufficient documentation

## 2020-03-16 DIAGNOSIS — Z95828 Presence of other vascular implants and grafts: Secondary | ICD-10-CM

## 2020-03-16 DIAGNOSIS — Z853 Personal history of malignant neoplasm of breast: Secondary | ICD-10-CM | POA: Insufficient documentation

## 2020-03-16 DIAGNOSIS — R110 Nausea: Secondary | ICD-10-CM | POA: Diagnosis not present

## 2020-03-16 DIAGNOSIS — Z86718 Personal history of other venous thrombosis and embolism: Secondary | ICD-10-CM | POA: Insufficient documentation

## 2020-03-16 DIAGNOSIS — Z79899 Other long term (current) drug therapy: Secondary | ICD-10-CM | POA: Insufficient documentation

## 2020-03-16 DIAGNOSIS — R002 Palpitations: Secondary | ICD-10-CM | POA: Diagnosis not present

## 2020-03-16 DIAGNOSIS — R0602 Shortness of breath: Secondary | ICD-10-CM | POA: Insufficient documentation

## 2020-03-16 DIAGNOSIS — K7460 Unspecified cirrhosis of liver: Secondary | ICD-10-CM | POA: Diagnosis not present

## 2020-03-16 DIAGNOSIS — Z7901 Long term (current) use of anticoagulants: Secondary | ICD-10-CM | POA: Insufficient documentation

## 2020-03-16 DIAGNOSIS — R12 Heartburn: Secondary | ICD-10-CM | POA: Insufficient documentation

## 2020-03-16 DIAGNOSIS — Z8719 Personal history of other diseases of the digestive system: Secondary | ICD-10-CM | POA: Insufficient documentation

## 2020-03-16 DIAGNOSIS — K7682 Hepatic encephalopathy: Secondary | ICD-10-CM

## 2020-03-16 DIAGNOSIS — K7581 Nonalcoholic steatohepatitis (NASH): Secondary | ICD-10-CM | POA: Insufficient documentation

## 2020-03-16 LAB — CBC WITH DIFFERENTIAL (CANCER CENTER ONLY)
Abs Immature Granulocytes: 0.01 10*3/uL (ref 0.00–0.07)
Basophils Absolute: 0 10*3/uL (ref 0.0–0.1)
Basophils Relative: 1 %
Eosinophils Absolute: 0.1 10*3/uL (ref 0.0–0.5)
Eosinophils Relative: 3 %
HCT: 28.9 % — ABNORMAL LOW (ref 36.0–46.0)
Hemoglobin: 9.9 g/dL — ABNORMAL LOW (ref 12.0–15.0)
Immature Granulocytes: 0 %
Lymphocytes Relative: 19 %
Lymphs Abs: 0.6 10*3/uL — ABNORMAL LOW (ref 0.7–4.0)
MCH: 37.2 pg — ABNORMAL HIGH (ref 26.0–34.0)
MCHC: 34.3 g/dL (ref 30.0–36.0)
MCV: 108.6 fL — ABNORMAL HIGH (ref 80.0–100.0)
Monocytes Absolute: 0.3 10*3/uL (ref 0.1–1.0)
Monocytes Relative: 11 %
Neutro Abs: 2 10*3/uL (ref 1.7–7.7)
Neutrophils Relative %: 66 %
Platelet Count: 105 10*3/uL — ABNORMAL LOW (ref 150–400)
RBC: 2.66 MIL/uL — ABNORMAL LOW (ref 3.87–5.11)
RDW: 14.4 % (ref 11.5–15.5)
WBC Count: 3 10*3/uL — ABNORMAL LOW (ref 4.0–10.5)
nRBC: 0 % (ref 0.0–0.2)

## 2020-03-16 LAB — CMP (CANCER CENTER ONLY)
ALT: 47 U/L — ABNORMAL HIGH (ref 0–44)
AST: 61 U/L — ABNORMAL HIGH (ref 15–41)
Albumin: 3.2 g/dL — ABNORMAL LOW (ref 3.5–5.0)
Alkaline Phosphatase: 145 U/L — ABNORMAL HIGH (ref 38–126)
Anion gap: 8 (ref 5–15)
BUN: 13 mg/dL (ref 8–23)
CO2: 27 mmol/L (ref 22–32)
Calcium: 9.7 mg/dL (ref 8.9–10.3)
Chloride: 100 mmol/L (ref 98–111)
Creatinine: 0.9 mg/dL (ref 0.44–1.00)
GFR, Estimated: >60 mL/min (ref >60)
Glucose, Bld: 261 mg/dL — ABNORMAL HIGH (ref 70–99)
Potassium: 3.6 mmol/L (ref 3.5–5.1)
Sodium: 135 mmol/L (ref 135–145)
Total Bilirubin: 1.5 mg/dL — ABNORMAL HIGH (ref 0.3–1.2)
Total Protein: 6.5 g/dL (ref 6.5–8.1)

## 2020-03-16 LAB — AMMONIA: Ammonia: 166 umol/L — ABNORMAL HIGH (ref 9–35)

## 2020-03-16 LAB — LACTATE DEHYDROGENASE: LDH: 214 U/L — ABNORMAL HIGH (ref 98–192)

## 2020-03-16 MED ORDER — HEPARIN SOD (PORK) LOCK FLUSH 100 UNIT/ML IV SOLN
500.0000 [IU] | Freq: Once | INTRAVENOUS | Status: AC
  Administered 2020-03-16: 12:00:00 500 [IU] via INTRAVENOUS
  Filled 2020-03-16: qty 5

## 2020-03-16 MED ORDER — SODIUM CHLORIDE 0.9% FLUSH
10.0000 mL | Freq: Once | INTRAVENOUS | Status: AC
  Administered 2020-03-16: 10 mL via INTRAVENOUS
  Filled 2020-03-16: qty 10

## 2020-03-16 NOTE — Progress Notes (Signed)
Hematology and Oncology Follow Up Visit  Beverly Davis [id number] 1941-01-07 79 y.o. 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  Thankfully, her sister comes in with her.  Her sister is really really helpful.  Beverly Davis seems to be doing fairly well.  As long she takes the lactulose, this helps with her ammonia.  Her sister said that over the weekend she did not take her lactulose as she became very confused.  Beverly Davis does get confused quite a bit.  Some of that has to do with her medications.  I told her that she can take Metformin twice a day.  This may help with the diarrhea little bit.  We actually did do a MRI of her abdomen.  This was done on December 4.  The MRI showed that what was in her liver with respect to her cancer was stable.  However what was outside the liver and a lymph node was larger.  Her last alpha-fetoprotein was actually little better at 590.  My hospice is seeing her.  I think they are doing a good job.  Again, Beverly Davis just has confusion.  Some of this is from the cirrhosis.  She is not hurting.  She seems to be eating fairly well.  She has had no bleeding.  Currently, I would have to say that her performance status is by ECOG 3.       Medications:  Allergies as of 03/16/2020      Reactions   Metformin And Related Other (See Comments)   Unknown Reaction   Welchol  [colesevelam Hcl] Other (See Comments)   Unknown Reaction      Medication List       Accurate as of March 16, 2020 12:34 PM. If you have any questions, ask your nurse or doctor.        Cal Mag Zinc +D3 Tabs Take 1 tablet by mouth daily.   cyanocobalamin 1000 MCG tablet Take 1,000 mcg by mouth daily.   Durezol 0.05 % Emul Generic drug: Difluprednate   furosemide 20 MG tablet Commonly known as: LASIX Take 1 tablet (20 mg total) by mouth daily.   glipiZIDE 5 MG tablet Commonly known as: GLUCOTROL Take 1/2 to 1 tablet 3 x /day with Meals for Diabetes   lactulose 10 GM/15ML solution Commonly known as: CHRONULAC Take 30 mLs (20 g total) by mouth 3 (three) times daily.   levothyroxine 100 MCG tablet Commonly known as: Synthroid Take 1 tablet daily on an empty stomach with only water for 30 minutes & no Antacid meds, Calcium or Magnesium for 4 hours & avoid Biotin   magnesium oxide 400 MG tablet Commonly known as: MAG-OX Take 400 mg by mouth daily.   metFORMIN 500 MG tablet Commonly known as: GLUCOPHAGE Take 1 tablet 2 x /day with Breakfast & Lunch and 2 tablets with Supper for Diabetes   mirabegron ER 25 MG Tb24 tablet Commonly known as: Myrbetriq Take 1 tablet (25 mg total) by mouth  at bedtime.   moxifloxacin 0.5 % ophthalmic solution Commonly known as: VIGAMOX   OneTouch Delica Plus [id number] Misc USE AS DIRECTED TO CHECK BLOOD SUGAR THREE TIMES DAILY BEFORE MEALS   OneTouch Ultra test strip Generic drug: glucose blood USE AS DIRECTED TO CHECK BLOOD SUGAR THREE TIMES DAILY BEFORE MEALS   spironolactone 25 MG tablet Commonly known as: ALDACTONE Take 25 mg by mouth 2 (two) times daily.   Vitamin D3 125 MCG (5000 UT) Caps Take 5,000 Units by mouth daily.       Allergies:  Allergies  Allergen Reactions  . Metformin And Related Other (See Comments)    Unknown Reaction  . Welchol [Colesevelam Hcl] Other (See Comments)    Unknown Reaction    Past  Medical History, Surgical history, Social history, and Family History were reviewed and updated.  Review of Systems: Review of Systems  Constitutional: Negative.   HENT: Negative.   Eyes: Negative.   Respiratory: Positive for shortness of breath.   Cardiovascular: Positive for palpitations.  Gastrointestinal: Positive for heartburn and nausea.  Genitourinary: Negative.   Musculoskeletal: Negative.   Skin: Negative.   Neurological: Negative.   Endo/Heme/Allergies: Negative.   Psychiatric/Behavioral: Negative.      Physical Exam:  height is 5' 2" (1.575 m) and weight is 126 lb 1.6 oz (57.2 kg). Her oral temperature is 97.6 F (36.4 C). Her blood pressure is 117/55 (abnormal) and her pulse is 75. Her respiration is 18 and oxygen saturation is 100%.   Wt Readings from Last 3 Encounters:  03/16/20 126 lb 1.6 oz (57.2 kg)  02/10/20 125 lb (56.7 kg)  01/03/20 129 lb (58.5 kg)    Physical Exam Vitals reviewed.  HENT:     Head: Normocephalic and atraumatic.  Eyes:     Pupils: Pupils are equal, round, and reactive to light.  Cardiovascular:     Rate and Rhythm: Normal rate and regular rhythm.     Heart sounds: Normal heart sounds.  Pulmonary:     Effort: Pulmonary effort is normal.     Breath sounds: Normal breath sounds.  Abdominal:     General: Bowel sounds are normal.     Palpations: Abdomen is soft.  Musculoskeletal:        General: No tenderness or deformity. Normal range of motion.     Cervical back: Normal range of motion.  Lymphadenopathy:     Cervical: No cervical adenopathy.  Skin:    General: Skin is warm and dry.     Findings: No erythema or rash.  Neurological:     Mental Status: She is alert and oriented to person, place, and time.  Psychiatric:        Behavior: Behavior normal.        Thought Content: Thought content normal.        Judgment: Judgment normal.      Lab Results  Component Value Date   WBC 3.0 (L) 03/16/2020   HGB 9.9 (L) 03/16/2020    HCT 28.9 (L) 03/16/2020   MCV 108.6 (H) 03/16/2020   PLT 105 (L) 03/16/2020   Lab Results  Component Value Date   FERRITIN 84 03/17/2019   IRON 72 03/17/2019   TIBC 311 03/17/2019   UIBC 239 03/17/2019   IRONPCTSAT 23 03/17/2019   Lab Results  Component Value Date   RETICCTPCT 1.2 09/05/2015   RBC 2.66 (L) 03/16/2020   RETICCTABS 40,080 09/05/2015   No results found for: KPAFRELGTCHN, LAMBDASER, KAPLAMBRATIO No results found  for: Osborne Casco Lab Results  Component Value Date   TOTALPROTELP 7.1 02/03/2008   ALBUMINELP 54.8 (L) 02/03/2008   A1GS 4.2 02/03/2008   A2GS 9.5 02/03/2008   BETS 8.1 (H) 02/03/2008   BETA2SER 5.8 02/03/2008   GAMS 17.6 02/03/2008   MSPIKE NOT DET 02/03/2008   SPEI * 02/03/2008     Chemistry      Component Value Date/Time   NA 134 (L) 02/10/2020 1445   NA 139 01/01/2017 1034   K 3.7 02/10/2020 1445   K 4.1 01/01/2017 1034   CL 103 02/10/2020 1445   CL 107 01/01/2017 1034   CO2 22 02/10/2020 1445   CO2 29 01/01/2017 1034   BUN 16 02/10/2020 1445   BUN 9 01/01/2017 1034   CREATININE 0.76 02/10/2020 1445   CREATININE 0.75 09/02/2019 1544      Component Value Date/Time   CALCIUM 9.3 02/10/2020 1445   CALCIUM 9.4 01/01/2017 1034   ALKPHOS 141 (H) 02/10/2020 1445   ALKPHOS 81 01/01/2017 1034   AST 52 (H) 02/10/2020 1445   ALT 43 02/10/2020 1445   ALT 48 (H) 01/01/2017 1034   BILITOT 1.1 02/10/2020 1445      Impression and Plan: Beverly Davis is a very sweet 79 year old white female.  She has NASH cirrhosis with hepatocellular carcinoma.   Overall, I would have to say that she seems to be holding her own.  Again her blood sugars are on the high side but I really do not think this is going to be that much of an issue for her.  It is her ammonia that is a wise issue.  As long as she takes the lactulose I think she will keep the ammonia and check.  I would let hospice is seen her.  I really think that she will have a decent  quality of life.  She is not hurting.  It is not clear how quickly her cancer will progress.  Her liver function studies are about the same.  We will plan to get her back to see [place] in a month or so.    [name], MD 12/23/202112:34 PM

## 2020-03-16 NOTE — Patient Instructions (Signed)
Tunneled Central Venous Catheter Flushing Guide  It is important to flush your tunneled central venous catheter each time you use it, both before and after you use it. Flushing your catheter will help prevent it from clogging. What are the risks? Risks may include:  Infection.  Air getting into the catheter and bloodstream. Supplies needed:  A clean pair of gloves.  A disinfecting wipe. Use an alcohol wipe, chlorhexidine wipe, or iodine wipe as told by your health care provider.  A 10 mL syringe that has been prefilled with saline solution.  An empty 10 mL syringe, if a substance called heparin was injected into your catheter. How to flush your catheter When you flush your catheter, make sure you follow any specific instructions from your health care provider or the manufacturer. These are general guidelines. Flushing your catheter before use If there is heparin in your catheter: 1. Wash your hands with soap and water. 2. Put on gloves. 3. Scrub the injection cap for a minimum of 15 seconds with a disinfecting wipe. 4. Unclamp the catheter. 5. Attach the empty syringe to the injection cap. 6. Pull the syringe plunger back and withdraw 10 mL of blood. 7. Place the syringe into an appropriate waste container. 8. Scrub the injection cap for 15 seconds with a disinfecting wipe. 9. Attach the prefilled syringe to the injection cap. 10. Flush the catheter by pushing the plunger forward until all the liquid from the syringe is in the catheter. 11. Remove the syringe from the injection cap. 12. Clamp the catheter. If there is no heparin in your catheter: 1. Wash your hands with soap and water. 2. Put on gloves. 3. Scrub the injection cap for 15 seconds with a disinfecting wipe. 4. Unclamp the catheter. 5. Attach the prefilled syringe to the injection cap. 6. Flush the catheter by pushing the plunger forward until 5 mL of the liquid from the syringe is in the catheter. 7. Pull back on  the syringe until you see blood in the catheter. 8. If you have been asked to collect any blood, follow your health care provider's instructions. Otherwise, flush the catheter with the rest of the solution from the syringe. 9. Remove the syringe from the injection cap. 10. Clamp the catheter.  Flushing your catheter after use 1. Wash your hands with soap and water. 2. Put on gloves. 3. Scrub the injection cap for 15 seconds with a disinfecting wipe. 4. Unclamp the catheter. 5. Attach the prefilled syringe to the injection cap. 6. Flush the catheter by pushing the plunger forward until all of the liquid from the syringe is in the catheter. 7. Remove the syringe from the injection cap. 8. Clamp the catheter. Problems and solutions  If blood cannot be completely cleared from the injection cap, you may need to have the injection cap replaced.  If the catheter is difficult to flush, use the pulsing method. The pulsing method involves pushing only a few milliliters of solution into the catheter at a time and pausing between pushes.  If you do not see blood in the catheter when you pull back on the syringe, change your body position, such as by raising your arms above your head. Take a deep breath and cough. Then, pull back on the syringe. If you still do not see blood, flush the catheter with a small amount of solution. Then, change positions again and take a breath or cough. Pull back on the syringe again. If you still do not see  blood, finish flushing the catheter and contact your health care provider. Do not use your catheter until your health care provider says it is okay. General tips  Have someone help you flush your catheter, if possible.  Do not force fluid through your catheter.  Do not use a syringe that is larger or smaller than 10 mL. Using a smaller syringe can make the catheter burst.  Do not use your catheter without flushing it first if it has heparin in it. Contact a health  care provider if:  You cannot see any blood in the catheter when you flush it before using it.  Your catheter is difficult to flush. Get help right away if:  You cannot flush the catheter.  The catheter leaks when you flush it or when there is fluid in it.  There are cracks or breaks in the catheter. Summary  It is important to flush your tunneled central venous catheter each time you use it, both before and after you use it.  Scrub the injection cap for 15 seconds with a disinfecting wipe before and after you flush it.  When you flush your catheter, make sure you follow any specific instructions from your health care provider or the manufacturer.  Get help right away if you cannot flush the catheter. This information is not intended to replace advice given to you by your health care provider. Make sure you discuss any questions you have with your health care provider. Document Revised: 12/04/2018 Document Reviewed: 05/27/2018 Elsevier Patient Education  [place].

## 2020-03-16 NOTE — Telephone Encounter (Signed)
-----   Message from [name], MD sent at 03/16/2020  3:39 PM EST ----- Call her son -- the ammonia is still quite high.  She needs to continue the Lactulose 3x a day.  Beverly Davis

## 2020-03-16 NOTE — Telephone Encounter (Signed)
Appointments scheduled calendar printed & mailed per 12/23 los

## 2020-03-16 NOTE — Telephone Encounter (Signed)
This nurse called [name], the son, and informed him that the ammonia level is still quite high. She needs to continue the Lactulose three times a day. He verbalized understanding.

## 2020-03-17 LAB — AFP TUMOR MARKER: AFP, Serum, Tumor Marker: 792 ng/mL — ABNORMAL HIGH (ref 0.0–8.3)

## 2020-03-21 ENCOUNTER — Other Ambulatory Visit: Payer: Self-pay | Admitting: Hematology & Oncology

## 2020-03-28 ENCOUNTER — Other Ambulatory Visit: Payer: Self-pay

## 2020-03-28 DIAGNOSIS — N182 Chronic kidney disease, stage 2 (mild): Secondary | ICD-10-CM

## 2020-03-28 DIAGNOSIS — E1122 Type 2 diabetes mellitus with diabetic chronic kidney disease: Secondary | ICD-10-CM

## 2020-03-28 MED ORDER — GLIPIZIDE 5 MG PO TABS: ORAL_TABLET | ORAL | 0 refills | Status: DC

## 2020-04-12 ENCOUNTER — Telehealth: Payer: Self-pay

## 2020-04-12 NOTE — Telephone Encounter (Signed)
S/w pt and r.s her appt to 1-28 per her req    Webb Silversmith

## 2020-04-13 ENCOUNTER — Other Ambulatory Visit: Payer: PPO

## 2020-04-13 ENCOUNTER — Ambulatory Visit: Payer: PPO | Admitting: Hematology & Oncology

## 2020-04-21 ENCOUNTER — Inpatient Hospital Stay: Payer: Medicare Other | Attending: Hematology & Oncology

## 2020-04-21 ENCOUNTER — Encounter: Payer: Self-pay | Admitting: Hematology & Oncology

## 2020-04-21 ENCOUNTER — Inpatient Hospital Stay: Payer: Medicare Other

## 2020-04-21 ENCOUNTER — Other Ambulatory Visit: Payer: Self-pay

## 2020-04-21 ENCOUNTER — Inpatient Hospital Stay (HOSPITAL_BASED_OUTPATIENT_CLINIC_OR_DEPARTMENT_OTHER): Payer: Medicare Other | Admitting: Hematology & Oncology

## 2020-04-21 ENCOUNTER — Telehealth: Payer: Self-pay

## 2020-04-21 VITALS — BP 110/61 | HR 75 | Temp 98.7°F | Resp 17

## 2020-04-21 DIAGNOSIS — F0390 Unspecified dementia without behavioral disturbance: Secondary | ICD-10-CM | POA: Insufficient documentation

## 2020-04-21 DIAGNOSIS — Z79899 Other long term (current) drug therapy: Secondary | ICD-10-CM | POA: Insufficient documentation

## 2020-04-21 DIAGNOSIS — K7581 Nonalcoholic steatohepatitis (NASH): Secondary | ICD-10-CM | POA: Diagnosis not present

## 2020-04-21 DIAGNOSIS — C220 Liver cell carcinoma: Secondary | ICD-10-CM

## 2020-04-21 DIAGNOSIS — Z7901 Long term (current) use of anticoagulants: Secondary | ICD-10-CM | POA: Diagnosis not present

## 2020-04-21 LAB — PREALBUMIN: Prealbumin: 8.2 mg/dL — ABNORMAL LOW (ref 18–38)

## 2020-04-21 LAB — CBC WITH DIFFERENTIAL (CANCER CENTER ONLY)
Abs Immature Granulocytes: 0 10*3/uL (ref 0.00–0.07)
Basophils Absolute: 0 10*3/uL (ref 0.0–0.1)
Basophils Relative: 1 %
Eosinophils Absolute: 0.1 10*3/uL (ref 0.0–0.5)
Eosinophils Relative: 3 %
HCT: 30.1 % — ABNORMAL LOW (ref 36.0–46.0)
Hemoglobin: 10.3 g/dL — ABNORMAL LOW (ref 12.0–15.0)
Immature Granulocytes: 0 %
Lymphocytes Relative: 23 %
Lymphs Abs: 0.7 10*3/uL (ref 0.7–4.0)
MCH: 36.3 pg — ABNORMAL HIGH (ref 26.0–34.0)
MCHC: 34.2 g/dL (ref 30.0–36.0)
MCV: 106 fL — ABNORMAL HIGH (ref 80.0–100.0)
Monocytes Absolute: 0.3 10*3/uL (ref 0.1–1.0)
Monocytes Relative: 10 %
Neutro Abs: 1.9 10*3/uL (ref 1.7–7.7)
Neutrophils Relative %: 63 %
Platelet Count: 112 10*3/uL — ABNORMAL LOW (ref 150–400)
RBC: 2.84 MIL/uL — ABNORMAL LOW (ref 3.87–5.11)
RDW: 13.7 % (ref 11.5–15.5)
WBC Count: 3.1 10*3/uL — ABNORMAL LOW (ref 4.0–10.5)
nRBC: 0 % (ref 0.0–0.2)

## 2020-04-21 LAB — CMP (CANCER CENTER ONLY)
ALT: 41 U/L (ref 0–44)
AST: 55 U/L — ABNORMAL HIGH (ref 15–41)
Albumin: 3.4 g/dL — ABNORMAL LOW (ref 3.5–5.0)
Alkaline Phosphatase: 181 U/L — ABNORMAL HIGH (ref 38–126)
Anion gap: 10 (ref 5–15)
BUN: 12 mg/dL (ref 8–23)
CO2: 22 mmol/L (ref 22–32)
Calcium: 9.5 mg/dL (ref 8.9–10.3)
Chloride: 101 mmol/L (ref 98–111)
Creatinine: 0.8 mg/dL (ref 0.44–1.00)
GFR, Estimated: >60 mL/min (ref >60)
Glucose, Bld: 275 mg/dL — ABNORMAL HIGH (ref 70–99)
Potassium: 3.9 mmol/L (ref 3.5–5.1)
Sodium: 133 mmol/L — ABNORMAL LOW (ref 135–145)
Total Bilirubin: 1.3 mg/dL — ABNORMAL HIGH (ref 0.3–1.2)
Total Protein: 6.7 g/dL (ref 6.5–8.1)

## 2020-04-21 LAB — LACTATE DEHYDROGENASE: LDH: 205 U/L — ABNORMAL HIGH (ref 98–192)

## 2020-04-21 LAB — AMMONIA: Ammonia: 124 umol/L — ABNORMAL HIGH (ref 9–35)

## 2020-04-21 NOTE — Patient Instructions (Signed)
Implanted [facility] Guide An implanted port is a device that is placed under the skin. It is usually placed in the chest. The device can be used to give IV medicine, to take blood, or for dialysis. You may have an implanted port if:  You need IV medicine that would be irritating to the small veins in your hands or arms.  You need IV medicines, such as antibiotics, for a long period of time.  You need IV nutrition for a long period of time.  You need dialysis. When you have a port, your health care provider can choose to use the port instead of veins in your arms for these procedures. You may have fewer limitations when using a port than you would if you used other types of long-term IVs, and you will likely be able to return to normal activities after your incision heals. An implanted port has two main parts:  Reservoir. The reservoir is the part where a needle is inserted to give medicines or draw blood. The reservoir is round. After it is placed, it appears as a small, raised area under your skin.  Catheter. The catheter is a thin, flexible tube that connects the reservoir to a vein. Medicine that is inserted into the reservoir goes into the catheter and then into the vein. How is my port accessed? To access your port:  A numbing cream may be placed on the skin over the port site.  Your health care provider will put on a mask and sterile gloves.  The skin over your port will be cleaned carefully with a germ-killing soap and allowed to dry.  Your health care provider will gently pinch the port and insert a needle into it.  Your health care provider will check for a blood return to make sure the port is in the vein and is not clogged.  If your port needs to remain accessed to get medicine continuously (constant infusion), your health care provider will place a clear bandage (dressing) over the needle site. The dressing and needle will need to be changed every week, or as told by your  health care provider. What is flushing? Flushing helps keep the port from getting clogged. Follow instructions from your health care provider about how and when to flush the port. Ports are usually flushed with saline solution or a medicine called heparin. The need for flushing will depend on how the port is used:  If the port is only used from time to time to give medicines or draw blood, the port may need to be flushed: ? Before and after medicines have been given. ? Before and after blood has been drawn. ? As part of routine maintenance. Flushing may be recommended every 4-6 weeks.  If a constant infusion is running, the port may not need to be flushed.  Throw away any syringes in a disposal container that is meant for sharp items (sharps container). You can buy a sharps container from a pharmacy, or you can make one by using an empty hard plastic bottle with a cover. How long will my port stay implanted? The port can stay in for as long as your health care provider thinks it is needed. When it is time for the port to come out, a surgery will be done to remove it. The surgery will be similar to the procedure that was done to put the port in. Follow these instructions at home:  Flush your port as told by your health care  provider.  If you need an infusion over several days, follow instructions from your health care provider about how to take care of your port site. Make sure you: ? Wash your hands with soap and water before you change your dressing. If soap and water are not available, use alcohol-based hand sanitizer. ? Change your dressing as told by your health care provider. ? Place any used dressings or infusion bags into a plastic bag. Throw that bag in the trash. ? Keep the dressing that covers the needle clean and dry. Do not get it wet. ? Do not use scissors or sharp objects near the tube. ? Keep the tube clamped, unless it is being used.  Check your port site every day for signs  of infection. Check for: ? Redness, swelling, or pain. ? Fluid or blood. ? Pus or a bad smell.  Protect the skin around the port site. ? Avoid wearing bra straps that rub or irritate the site. ? Protect the skin around your port from seat belts. Place a soft pad over your chest if needed.  Bathe or shower as told by your health care provider. The site may get wet as long as you are not actively receiving an infusion.  Return to your normal activities as told by your health care provider. Ask your health care provider what activities are safe for you.  Carry a medical alert card or wear a medical alert bracelet at all times. This will let health care providers know that you have an implanted port in case of an emergency.   Get help right away if:  You have redness, swelling, or pain at the port site.  You have fluid or blood coming from your port site.  You have pus or a bad smell coming from the port site.  You have a fever. Summary  Implanted ports are usually placed in the chest for long-term IV access.  Follow instructions from your health care provider about flushing the port and changing bandages (dressings).  Take care of the area around your port by avoiding clothing that puts pressure on the area, and by watching for signs of infection.  Protect the skin around your port from seat belts. Place a soft pad over your chest if needed.  Get help right away if you have a fever or you have redness, swelling, pain, drainage, or a bad smell at the port site. This information is not intended to replace advice given to you by your health care provider. Make sure you discuss any questions you have with your health care provider. Document Revised: 07/26/2019 Document Reviewed: 07/26/2019 Elsevier Patient Education  [place].

## 2020-04-21 NOTE — Telephone Encounter (Signed)
Called pt with appts per 04/21/20 appts, did not rec a vm, appts printed and mailed to pt    Beverly Davis

## 2020-04-21 NOTE — Progress Notes (Signed)
Hematology and Oncology Follow Up Visit  Beverly Davis [id number] Aug 19, 1940 80 y.o. 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 is here today for a follow-up.  Thankfully, her sister comes in with her.  Her sister is really really helpful.  [name] had a fairly decent Christmas and New Year's.  Again she has some dementia.  She really cannot remember what she did.  Her problem is at her blood sugars are still in the very high side.  Again I am not sure who is managing this.  I told her that she can tell the hospice people about her blood sugars and they can get the hospice doctor to help out.  She is on Metformin and Glucotrol.  I wrote out for her what she should be taking.  I told her to take thousand of Metformin twice a day and 5 mg of Glucotrol twice a day.  She sometimes does not take her lactulose for the elevated ammonia.  At this morning, her ammonia was 124.  As always, she asked me about driving.  She wants to drive.  I told her that she is not to drive.  I told her I would let her drive once her ammonia is below 40.  She is not complaining of any pain.  She had an episode where she had a little bit of blood in her sputum.  There is not frank hemoptysis.  She has had no problems with bleeding in the urine or in the bowels.  She does seem to be eating okay.  Overall, her performance status is probably ECOG 2-3.     Medications:  Allergies as of 04/21/2020      Reactions   Metformin And Related Other (See Comments)   Unknown Reaction   Welchol [colesevelam Hcl] Other (See Comments)   Unknown Reaction      Medication List       Accurate as of April 21, 2020  3:13 PM. If you have any questions, ask your nurse or doctor.        Cal Mag Zinc +D3 Tabs Take 1 tablet by mouth daily.   cyanocobalamin 1000 MCG tablet Take 1,000 mcg by mouth daily.   Durezol 0.05 % Emul Generic drug: Difluprednate   furosemide 20 MG tablet Commonly known as: LASIX Take 1 tablet (20 mg total) by mouth daily.   glipiZIDE 5 MG tablet Commonly known as: GLUCOTROL Take 1/2 to 1 tablet 3 x /day with Meals for Diabetes   lactulose 10 GM/15ML solution Commonly known as: CHRONULAC Take 30 mLs (20 g total) by mouth 3 (three) times daily.   levothyroxine 100 MCG tablet Commonly known as: Synthroid Take 1 tablet daily on an empty stomach with only water for 30 minutes & no Antacid meds, Calcium or Magnesium for 4 hours & avoid Biotin   magnesium oxide 400 MG tablet Commonly known as: MAG-OX Take 400 mg by mouth daily.   metFORMIN 500 MG tablet Commonly known as: GLUCOPHAGE Take 1 tablet 2 x /day with  Breakfast & Lunch and 2 tablets with Supper for Diabetes   mirabegron ER 25 MG Tb24 tablet Commonly known as: Myrbetriq Take 1 tablet (25 mg total) by mouth at bedtime.   moxifloxacin 0.5 % ophthalmic solution Commonly known as: VIGAMOX   OneTouch Delica Plus [id number] Misc USE AS DIRECTED TO CHECK BLOOD SUGAR THREE TIMES DAILY BEFORE MEALS   OneTouch Ultra test strip Generic drug: glucose blood USE AS DIRECTED TO CHECK BLOOD SUGAR THREE TIMES DAILY BEFORE MEALS   spironolactone 25 MG tablet Commonly known as: ALDACTONE Take 25 mg by mouth 2 (two) times daily.   Vitamin D3 125 MCG (5000 UT) Caps Take 5,000 Units by mouth daily.       Allergies:  Allergies  Allergen Reactions  . Metformin  And Related Other (See Comments)    Unknown Reaction  . Welchol [Colesevelam Hcl] Other (See Comments)    Unknown Reaction    Past Medical History, Surgical history, Social history, and Family History were reviewed and updated.  Review of Systems: Review of Systems  Constitutional: Negative.   HENT: Negative.   Eyes: Negative.   Respiratory: Positive for shortness of breath.   Cardiovascular: Positive for palpitations.  Gastrointestinal: Positive for heartburn and nausea.  Genitourinary: Negative.   Musculoskeletal: Negative.   Skin: Negative.   Neurological: Negative.   Endo/Heme/Allergies: Negative.   Psychiatric/Behavioral: Negative.      Physical Exam:  vitals were not taken for this visit.   Wt Readings from Last 3 Encounters:  03/16/20 126 lb 1.6 oz (57.2 kg)  02/10/20 125 lb (56.7 kg)  01/03/20 129 lb (58.5 kg)    Physical Exam Vitals reviewed.  HENT:     Head: Normocephalic and atraumatic.  Eyes:     Pupils: Pupils are equal, round, and reactive to light.  Cardiovascular:     Rate and Rhythm: Normal rate and regular rhythm.     Heart sounds: Normal heart sounds.  Pulmonary:     Effort: Pulmonary effort is normal.     Breath sounds: Normal breath sounds.  Abdominal:     General: Bowel sounds are normal.     Palpations: Abdomen is soft.  Musculoskeletal:        General: No tenderness or deformity. Normal range of motion.     Cervical back: Normal range of motion.  Lymphadenopathy:     Cervical: No cervical adenopathy.  Skin:    General: Skin is warm and dry.     Findings: No erythema or rash.  Neurological:     Mental Status: She is alert and oriented to person, place, and time.  Psychiatric:        Behavior: Behavior normal.        Thought Content: Thought content normal.        Judgment: Judgment normal.      Lab Results  Component Value Date   WBC 3.1 (L) 04/21/2020   HGB 10.3 (L) 04/21/2020   HCT 30.1 (L) 04/21/2020   MCV 106.0 (H)  04/21/2020   PLT 112 (L) 04/21/2020   Lab Results  Component Value Date   FERRITIN 84 03/17/2019   IRON 72 03/17/2019   TIBC 311 03/17/2019   UIBC 239 03/17/2019   IRONPCTSAT 23 03/17/2019   Lab Results  Component Value Date   RETICCTPCT 1.2 09/05/2015   RBC 2.84 (L) 04/21/2020   RETICCTABS 40,080 09/05/2015   No results found for: KPAFRELGTCHN, LAMBDASER, KAPLAMBRATIO No results found for: Kandis Cocking, IGMSERUM Lab Results  Component Value Date   TOTALPROTELP 7.1 02/03/2008   ALBUMINELP 54.8 (L) 02/03/2008   A1GS 4.2 02/03/2008   A2GS 9.5 02/03/2008   BETS 8.1 (H) 02/03/2008   BETA2SER 5.8 02/03/2008   GAMS 17.6 02/03/2008   MSPIKE NOT DET 02/03/2008   SPEI * 02/03/2008     Chemistry      Component Value Date/Time   NA 133 (L) 04/21/2020 1430   NA 139 01/01/2017 1034   K 3.9 04/21/2020 1430   K 4.1 01/01/2017 1034   CL 101 04/21/2020 1430   CL 107 01/01/2017 1034   CO2 22 04/21/2020 1430   CO2 29 01/01/2017 1034   BUN 12 04/21/2020 1430   BUN 9 01/01/2017 1034   CREATININE 0.80 04/21/2020 1430   CREATININE 0.75 09/02/2019 1544      Component Value Date/Time   CALCIUM 9.5 04/21/2020 1430   CALCIUM 9.4 01/01/2017 1034   ALKPHOS 181 (H) 04/21/2020 1430   ALKPHOS 81 01/01/2017 1034   AST 55 (H) 04/21/2020 1430   ALT 41 04/21/2020 1430   ALT 48 (H) 01/01/2017 1034   BILITOT 1.3 (H) 04/21/2020 1430      Impression and Plan: Ms. Urey is a very sweet 80 year old white female.  She has NASH cirrhosis with hepatocellular carcinoma.   Overall, I would have to say that she seems to be holding her own.  I really am not all that bothered by her blood sugars being that high.  It would be nice if they were little bit better.  I think if her blood sugars were around 200 I think this would certainly be reasonable.  Again, I really do not see that her hepatocellular carcinoma is causing that much trouble.  Her last alpha-fetoprotein was 792 back in December.  It is  no surprise that it is going up.  Again, her quality of life seems to be doing pretty well.  I think she is satisfied with how well she is doing.  The only real issue is the lack of driving and I just am not going to let her drive.  We will plan for another follow-up in 4 weeks.  Hospice is doing a good job with her.  I think about once a week to see her.    [name], MD 1/28/20223:13 PM

## 2020-04-22 LAB — AFP TUMOR MARKER: AFP, Serum, Tumor Marker: 982 ng/mL — ABNORMAL HIGH (ref 0.0–8.3)

## 2020-05-18 ENCOUNTER — Inpatient Hospital Stay: Payer: Medicare Other | Attending: Hematology & Oncology

## 2020-05-18 ENCOUNTER — Ambulatory Visit: Payer: PPO | Admitting: Adult Health

## 2020-05-18 ENCOUNTER — Other Ambulatory Visit: Payer: Self-pay

## 2020-05-18 ENCOUNTER — Inpatient Hospital Stay (HOSPITAL_BASED_OUTPATIENT_CLINIC_OR_DEPARTMENT_OTHER): Payer: Medicare Other | Admitting: Hematology & Oncology

## 2020-05-18 ENCOUNTER — Inpatient Hospital Stay: Payer: Medicare Other

## 2020-05-18 VITALS — BP 107/51 | HR 72 | Temp 97.7°F | Resp 20 | Wt 130.0 lb

## 2020-05-18 DIAGNOSIS — K7581 Nonalcoholic steatohepatitis (NASH): Secondary | ICD-10-CM | POA: Insufficient documentation

## 2020-05-18 DIAGNOSIS — Z79899 Other long term (current) drug therapy: Secondary | ICD-10-CM | POA: Insufficient documentation

## 2020-05-18 DIAGNOSIS — Z7901 Long term (current) use of anticoagulants: Secondary | ICD-10-CM | POA: Diagnosis not present

## 2020-05-18 DIAGNOSIS — C220 Liver cell carcinoma: Secondary | ICD-10-CM | POA: Diagnosis not present

## 2020-05-18 DIAGNOSIS — Z452 Encounter for adjustment and management of vascular access device: Secondary | ICD-10-CM | POA: Diagnosis not present

## 2020-05-18 DIAGNOSIS — Z95828 Presence of other vascular implants and grafts: Secondary | ICD-10-CM

## 2020-05-18 LAB — CBC WITH DIFFERENTIAL (CANCER CENTER ONLY)
Abs Immature Granulocytes: 0.01 10*3/uL (ref 0.00–0.07)
Basophils Absolute: 0 10*3/uL (ref 0.0–0.1)
Basophils Relative: 1 %
Eosinophils Absolute: 0.1 10*3/uL (ref 0.0–0.5)
Eosinophils Relative: 4 %
HCT: 29.5 % — ABNORMAL LOW (ref 36.0–46.0)
Hemoglobin: 10 g/dL — ABNORMAL LOW (ref 12.0–15.0)
Immature Granulocytes: 0 %
Lymphocytes Relative: 23 %
Lymphs Abs: 0.6 10*3/uL — ABNORMAL LOW (ref 0.7–4.0)
MCH: 36 pg — ABNORMAL HIGH (ref 26.0–34.0)
MCHC: 33.9 g/dL (ref 30.0–36.0)
MCV: 106.1 fL — ABNORMAL HIGH (ref 80.0–100.0)
Monocytes Absolute: 0.3 10*3/uL (ref 0.1–1.0)
Monocytes Relative: 11 %
Neutro Abs: 1.7 10*3/uL (ref 1.7–7.7)
Neutrophils Relative %: 61 %
Platelet Count: 108 10*3/uL — ABNORMAL LOW (ref 150–400)
RBC: 2.78 MIL/uL — ABNORMAL LOW (ref 3.87–5.11)
RDW: 14.6 % (ref 11.5–15.5)
WBC Count: 2.8 10*3/uL — ABNORMAL LOW (ref 4.0–10.5)
nRBC: 0 % (ref 0.0–0.2)

## 2020-05-18 LAB — CMP (CANCER CENTER ONLY)
ALT: 44 U/L (ref 0–44)
AST: 67 U/L — ABNORMAL HIGH (ref 15–41)
Albumin: 3.4 g/dL — ABNORMAL LOW (ref 3.5–5.0)
Alkaline Phosphatase: 173 U/L — ABNORMAL HIGH (ref 38–126)
Anion gap: 10 (ref 5–15)
BUN: 14 mg/dL (ref 8–23)
CO2: 22 mmol/L (ref 22–32)
Calcium: 9.4 mg/dL (ref 8.9–10.3)
Chloride: 104 mmol/L (ref 98–111)
Creatinine: 0.75 mg/dL (ref 0.44–1.00)
GFR, Estimated: >60 mL/min (ref >60)
Glucose, Bld: 155 mg/dL — ABNORMAL HIGH (ref 70–99)
Potassium: 3.8 mmol/L (ref 3.5–5.1)
Sodium: 136 mmol/L (ref 135–145)
Total Bilirubin: 1.5 mg/dL — ABNORMAL HIGH (ref 0.3–1.2)
Total Protein: 6.4 g/dL — ABNORMAL LOW (ref 6.5–8.1)

## 2020-05-18 LAB — LACTATE DEHYDROGENASE: LDH: 234 U/L — ABNORMAL HIGH (ref 98–192)

## 2020-05-18 LAB — AMMONIA: Ammonia: 132 umol/L — ABNORMAL HIGH (ref 9–35)

## 2020-05-18 MED ORDER — SODIUM CHLORIDE 0.9% FLUSH
10.0000 mL | INTRAVENOUS | Status: DC | PRN
  Administered 2020-05-18: 10 mL via INTRAVENOUS
  Filled 2020-05-18: qty 10

## 2020-05-18 MED ORDER — HEPARIN SOD (PORK) LOCK FLUSH 100 UNIT/ML IV SOLN
500.0000 [IU] | Freq: Once | INTRAVENOUS | Status: AC
  Administered 2020-05-18: 500 [IU] via INTRAVENOUS
  Filled 2020-05-18: qty 5

## 2020-05-18 NOTE — Progress Notes (Signed)
Hematology and Oncology Follow Up Visit  Beverly Davis [id number] 12-Apr-1940 80 y.o. 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 is here today for a follow-up.  She comes in with her sister.  She still wants to drive.  I told her that she is not going to be able to drive as long as her ammonia is above 30.  Today, her ammonia was 132.  Her blood sugars are on the high side.  I am sure this probably is causing some of the issues with her ammonia.  She seems to be eating okay.  She is not having any nausea or vomiting.  She is not having any issues with pain.  There is no bleeding.  There is some leg swelling but she is on diuretics.  Overall, I would have to say that she is actually doing fairly well.  She is being followed by Hospice.  Overall, her performance status is ECOG 3.      Medications:  Allergies as of 05/18/2020      Reactions   Metformin And Related Other (See Comments)   Unknown Reaction   Welchol [colesevelam Hcl] Other (See Comments)   Unknown Reaction      Medication List       Accurate as of May 18, 2020  3:11 PM. If you have any questions, ask your nurse or doctor.        STOP taking these medications   Durezol 0.05 % Emul Generic drug: Difluprednate Stopped by: [name], MD   magnesium oxide 400 MG tablet Commonly known as: MAG-OX Stopped by: [name], MD   moxifloxacin 0.5 % ophthalmic  solution Commonly known as: VIGAMOX Stopped by: [name], MD     TAKE these medications   Cal Mag Zinc +D3 Tabs Take 1 tablet by mouth daily.   cyanocobalamin 1000 MCG tablet Take 1,000 mcg by mouth daily.   furosemide 20 MG tablet Commonly known as: LASIX Take 1 tablet (20 mg total) by mouth daily.   glipiZIDE 5 MG tablet Commonly known as: GLUCOTROL Take 1/2 to 1 tablet 3 x /day with Meals for Diabetes What changed:   how much to take  when to take this   lactulose 10 GM/15ML solution Commonly known as: CHRONULAC Take 30 mLs (20 g total) by mouth 3 (three) times daily.   levothyroxine 100 MCG tablet Commonly known as: Synthroid Take 1 tablet daily on an empty stomach with only water for 30 minutes & no Antacid meds, Calcium or Magnesium for 4 hours & avoid Biotin   metFORMIN 500 MG tablet Commonly known as: GLUCOPHAGE Take 1 tablet 2 x /day with Breakfast & Lunch and 2 tablets with Supper for Diabetes What changed:   how much to take  when to take this   mirabegron ER 25 MG Tb24 tablet Commonly known as: Myrbetriq Take 1 tablet (25 mg total) by mouth at bedtime.   OneTouch Delica Plus [id number] Misc USE AS DIRECTED TO CHECK BLOOD SUGAR THREE TIMES DAILY  BEFORE MEALS   OneTouch Ultra test strip Generic drug: glucose blood USE AS DIRECTED TO CHECK BLOOD SUGAR THREE TIMES DAILY BEFORE MEALS   spironolactone 25 MG tablet Commonly known as: ALDACTONE Take 25 mg by mouth 2 (two) times daily.   Vitamin D3 125 MCG (5000 UT) Caps Take 5,000 Units by mouth daily.       Allergies:  Allergies  Allergen Reactions  . Metformin And Related Other (See Comments)    Unknown Reaction  . Welchol [Colesevelam Hcl] Other (See Comments)    Unknown Reaction    Past Medical History, Surgical history, Social history, and Family History were reviewed and updated.  Review of Systems: Review of Systems  Constitutional: Negative.   HENT: Negative.   Eyes:  Negative.   Respiratory: Positive for shortness of breath.   Cardiovascular: Positive for palpitations.  Gastrointestinal: Positive for heartburn and nausea.  Genitourinary: Negative.   Musculoskeletal: Negative.   Skin: Negative.   Neurological: Negative.   Endo/Heme/Allergies: Negative.   Psychiatric/Behavioral: Negative.      Physical Exam:  weight is 130 lb (59 kg). Her oral temperature is 97.7 F (36.5 C). Her blood pressure is 107/51 (abnormal) and her pulse is 72. Her respiration is 20 and oxygen saturation is 100%.   Wt Readings from Last 3 Encounters:  05/18/20 130 lb (59 kg)  03/16/20 126 lb 1.6 oz (57.2 kg)  02/10/20 125 lb (56.7 kg)    Physical Exam Vitals reviewed.  HENT:     Head: Normocephalic and atraumatic.  Eyes:     Pupils: Pupils are equal, round, and reactive to light.  Cardiovascular:     Rate and Rhythm: Normal rate and regular rhythm.     Heart sounds: Normal heart sounds.  Pulmonary:     Effort: Pulmonary effort is normal.     Breath sounds: Normal breath sounds.  Abdominal:     General: Bowel sounds are normal.     Palpations: Abdomen is soft.  Musculoskeletal:        General: No tenderness or deformity. Normal range of motion.     Cervical back: Normal range of motion.  Lymphadenopathy:     Cervical: No cervical adenopathy.  Skin:    General: Skin is warm and dry.     Findings: No erythema or rash.  Neurological:     Mental Status: She is alert and oriented to person, place, and time.  Psychiatric:        Behavior: Behavior normal.        Thought Content: Thought content normal.        Judgment: Judgment normal.      Lab Results  Component Value Date   WBC 2.8 (L) 05/18/2020   HGB 10.0 (L) 05/18/2020   HCT 29.5 (L) 05/18/2020   MCV 106.1 (H) 05/18/2020   PLT 108 (L) 05/18/2020   Lab Results  Component Value Date   FERRITIN 84 03/17/2019   IRON 72 03/17/2019   TIBC 311 03/17/2019   UIBC 239 03/17/2019   IRONPCTSAT 23  03/17/2019   Lab Results  Component Value Date   RETICCTPCT 1.2 09/05/2015   RBC 2.78 (L) 05/18/2020   RETICCTABS 40,080 09/05/2015   No results found for: KPAFRELGTCHN, LAMBDASER, KAPLAMBRATIO No results found for: Kandis Cocking, IGMSERUM Lab Results  Component Value Date   TOTALPROTELP 7.1 02/03/2008   ALBUMINELP 54.8 (L) 02/03/2008   A1GS 4.2 02/03/2008   A2GS 9.5 02/03/2008   BETS 8.1 (H) 02/03/2008  BETA2SER 5.8 02/03/2008   GAMS 17.6 02/03/2008   MSPIKE NOT DET 02/03/2008   SPEI * 02/03/2008     Chemistry      Component Value Date/Time   NA 136 05/18/2020 1355   NA 139 01/01/2017 1034   K 3.8 05/18/2020 1355   K 4.1 01/01/2017 1034   CL 104 05/18/2020 1355   CL 107 01/01/2017 1034   CO2 22 05/18/2020 1355   CO2 29 01/01/2017 1034   BUN 14 05/18/2020 1355   BUN 9 01/01/2017 1034   CREATININE 0.75 05/18/2020 1355   CREATININE 0.75 09/02/2019 1544      Component Value Date/Time   CALCIUM 9.4 05/18/2020 1355   CALCIUM 9.4 01/01/2017 1034   ALKPHOS 173 (H) 05/18/2020 1355   ALKPHOS 81 01/01/2017 1034   AST 67 (H) 05/18/2020 1355   ALT 44 05/18/2020 1355   ALT 48 (H) 01/01/2017 1034   BILITOT 1.5 (H) 05/18/2020 1355      Impression and Plan: Ms. Beauchamp is a very sweet 80 year old white female.  She has NASH cirrhosis with hepatocellular carcinoma.   Overall, I would have to say that she seems to be holding her own.  I really am not all that bothered by her blood sugars being that high.  It would be nice if they were little bit better.  I think if her blood sugars were around 200 I think this would certainly be reasonable.  Again, I really do not see that her hepatocellular carcinoma is causing that much trouble.  Her last alpha-fetoprotein was 982..  It is no surprise that it is going up.  Again, her quality of life seems to be doing pretty well.  I think she is satisfied with how well she is doing.  The only real issue is the lack of driving and I just am not  going to let her drive.  We will plan for another follow-up in 6 weeks.  Hospice is doing a good job with her.  I think they see her about once a week to see her.    [name], MD 2/24/20223:11 PM

## 2020-05-18 NOTE — Patient Instructions (Signed)
Implanted Port Insertion, Care After This sheet gives you information about how to care for yourself after your procedure. Your health care provider may also give you more specific instructions. If you have problems or questions, contact your health care provider. What can I expect after the procedure? After the procedure, it is common to have:  Discomfort at the port insertion site.  Bruising on the skin over the port. This should improve over 3-4 days. Follow these instructions at home: Port care  After your port is placed, you will get a manufacturer's information card. The card has information about your port. Keep this card with you at all times.  Take care of the port as told by your health care provider. Ask your health care provider if you or a family member can get training for taking care of the port at home. A home health care nurse may also take care of the port.  Make sure to remember what type of port you have. Incision care  Follow instructions from your health care provider about how to take care of your port insertion site. Make sure you: ? Wash your hands with soap and water before and after you change your bandage (dressing). If soap and water are not available, use hand sanitizer. ? Change your dressing as told by your health care provider. ? Leave stitches (sutures), skin glue, or adhesive strips in place. These skin closures may need to stay in place for 2 weeks or longer. If adhesive strip edges start to loosen and curl up, you may trim the loose edges. Do not remove adhesive strips completely unless your health care provider tells you to do that.  Check your port insertion site every day for signs of infection. Check for: ? Redness, swelling, or pain. ? Fluid or blood. ? Warmth. ? Pus or a bad smell.      Activity  Return to your normal activities as told by your health care provider. Ask your health care provider what activities are safe for you.  Do not  lift anything that is heavier than 10 lb (4.5 kg), or the limit that you are told, until your health care provider says that it is safe. General instructions  Take over-the-counter and prescription medicines only as told by your health care provider.  Do not take baths, swim, or use a hot tub until your health care provider approves. Ask your health care provider if you may take showers. You may only be allowed to take sponge baths.  Do not drive for 24 hours if you were given a sedative during your procedure.  Wear a medical alert bracelet in case of an emergency. This will tell any health care providers that you have a port.  Keep all follow-up visits as told by your health care provider. This is important. Contact a health care provider if:  You cannot flush your port with saline as directed, or you cannot draw blood from the port.  You have a fever or chills.  You have redness, swelling, or pain around your port insertion site.  You have fluid or blood coming from your port insertion site.  Your port insertion site feels warm to the touch.  You have pus or a bad smell coming from the port insertion site. Get help right away if:  You have chest pain or shortness of breath.  You have bleeding from your port that you cannot control. Summary  Take care of the port as told by your   health care provider. Keep the manufacturer's information card with you at all times.  Change your dressing as told by your health care provider.  Contact a health care provider if you have a fever or chills or if you have redness, swelling, or pain around your port insertion site.  Keep all follow-up visits as told by your health care provider. This information is not intended to replace advice given to you by your health care provider. Make sure you discuss any questions you have with your health care provider. Document Revised: 10/07/2017 Document Reviewed: 10/07/2017 Elsevier Patient Education   2021 [employer]  

## 2020-05-19 ENCOUNTER — Telehealth: Payer: Self-pay | Admitting: *Deleted

## 2020-05-19 LAB — AFP TUMOR MARKER: AFP, Serum, Tumor Marker: 1040 ng/mL — ABNORMAL HIGH (ref 0.0–8.3)

## 2020-05-19 NOTE — Telephone Encounter (Signed)
-----   Message from [name], MD sent at 05/18/2020  3:58 PM EST ----- Call - the ammonia is 132!!  No driving for her!!  Laurey Arrow

## 2020-05-19 NOTE — Telephone Encounter (Signed)
Pt notified per order of Dr Marin Olp that "the ammonia is 132!!  No driving for her!! Laurey Arrow"  Pt is disappointed that she cannot drive, but verbalizes an understanding as to why she cannot drive d/t her elevated ammonia level. Pt is appreciative of call and has no questions at this time.

## 2020-05-20 LAB — HEMOGLOBIN A1C
Hgb A1c MFr Bld: 7.7 % — ABNORMAL HIGH (ref 4.8–5.6)
Mean Plasma Glucose: 174 mg/dL

## 2020-06-05 ENCOUNTER — Other Ambulatory Visit: Payer: Self-pay | Admitting: *Deleted

## 2020-06-05 DIAGNOSIS — M7989 Other specified soft tissue disorders: Secondary | ICD-10-CM

## 2020-06-05 DIAGNOSIS — C220 Liver cell carcinoma: Secondary | ICD-10-CM

## 2020-06-05 DIAGNOSIS — E039 Hypothyroidism, unspecified: Secondary | ICD-10-CM

## 2020-06-05 DIAGNOSIS — K7460 Unspecified cirrhosis of liver: Secondary | ICD-10-CM

## 2020-06-05 MED ORDER — SPIRONOLACTONE 50 MG PO TABS
50.0000 mg | ORAL_TABLET | Freq: Two times a day (BID) | ORAL | 3 refills | Status: DC
Start: 2020-06-05 — End: 2020-10-05

## 2020-06-05 MED ORDER — LEVOTHYROXINE SODIUM 100 MCG PO TABS: ORAL_TABLET | ORAL | 3 refills | Status: DC

## 2020-06-05 MED ORDER — METFORMIN HCL 500 MG PO TABS
ORAL_TABLET | ORAL | 0 refills | Status: DC
Start: 2020-06-05 — End: 2020-06-23

## 2020-06-05 MED ORDER — FUROSEMIDE 40 MG PO TABS: 40.0000 mg | ORAL_TABLET | Freq: Every day | ORAL | 3 refills | Status: DC

## 2020-06-20 ENCOUNTER — Other Ambulatory Visit: Payer: Self-pay | Admitting: *Deleted

## 2020-06-20 DIAGNOSIS — E1122 Type 2 diabetes mellitus with diabetic chronic kidney disease: Secondary | ICD-10-CM

## 2020-06-20 DIAGNOSIS — N182 Chronic kidney disease, stage 2 (mild): Secondary | ICD-10-CM

## 2020-06-20 MED ORDER — GLIPIZIDE 5 MG PO TABS: ORAL_TABLET | ORAL | 0 refills | Status: DC

## 2020-06-22 ENCOUNTER — Other Ambulatory Visit: Payer: Self-pay | Admitting: Hematology & Oncology

## 2020-06-22 DIAGNOSIS — N182 Chronic kidney disease, stage 2 (mild): Secondary | ICD-10-CM

## 2020-06-22 DIAGNOSIS — E1122 Type 2 diabetes mellitus with diabetic chronic kidney disease: Secondary | ICD-10-CM

## 2020-06-23 ENCOUNTER — Other Ambulatory Visit: Payer: Self-pay | Admitting: Hematology & Oncology

## 2020-06-23 ENCOUNTER — Other Ambulatory Visit: Payer: Self-pay

## 2020-06-23 DIAGNOSIS — C220 Liver cell carcinoma: Secondary | ICD-10-CM

## 2020-06-23 MED ORDER — LACTULOSE 10 GM/15ML PO SOLN: 20.0000 g | Freq: Three times a day (TID) | ORAL | 3 refills | Status: AC

## 2020-07-03 ENCOUNTER — Inpatient Hospital Stay: Payer: Medicare Other | Attending: Hematology & Oncology

## 2020-07-03 ENCOUNTER — Inpatient Hospital Stay: Payer: Medicare Other

## 2020-07-03 ENCOUNTER — Telehealth: Payer: Self-pay

## 2020-07-03 ENCOUNTER — Inpatient Hospital Stay (HOSPITAL_BASED_OUTPATIENT_CLINIC_OR_DEPARTMENT_OTHER): Payer: Medicare Other | Admitting: Hematology & Oncology

## 2020-07-03 ENCOUNTER — Other Ambulatory Visit: Payer: Self-pay

## 2020-07-03 ENCOUNTER — Encounter: Payer: Self-pay | Admitting: Hematology & Oncology

## 2020-07-03 VITALS — BP 92/60 | HR 70 | Temp 98.4°F | Resp 18 | Wt 130.0 lb

## 2020-07-03 DIAGNOSIS — K7581 Nonalcoholic steatohepatitis (NASH): Secondary | ICD-10-CM | POA: Insufficient documentation

## 2020-07-03 DIAGNOSIS — Z79899 Other long term (current) drug therapy: Secondary | ICD-10-CM | POA: Insufficient documentation

## 2020-07-03 DIAGNOSIS — R002 Palpitations: Secondary | ICD-10-CM | POA: Diagnosis not present

## 2020-07-03 DIAGNOSIS — K7460 Unspecified cirrhosis of liver: Secondary | ICD-10-CM | POA: Diagnosis not present

## 2020-07-03 DIAGNOSIS — Z7901 Long term (current) use of anticoagulants: Secondary | ICD-10-CM | POA: Diagnosis not present

## 2020-07-03 DIAGNOSIS — Z853 Personal history of malignant neoplasm of breast: Secondary | ICD-10-CM | POA: Diagnosis not present

## 2020-07-03 DIAGNOSIS — R0602 Shortness of breath: Secondary | ICD-10-CM | POA: Diagnosis not present

## 2020-07-03 DIAGNOSIS — C220 Liver cell carcinoma: Secondary | ICD-10-CM

## 2020-07-03 DIAGNOSIS — Z86718 Personal history of other venous thrombosis and embolism: Secondary | ICD-10-CM | POA: Diagnosis not present

## 2020-07-03 DIAGNOSIS — Z95828 Presence of other vascular implants and grafts: Secondary | ICD-10-CM

## 2020-07-03 LAB — CBC WITH DIFFERENTIAL (CANCER CENTER ONLY)
Abs Immature Granulocytes: 0.01 10*3/uL (ref 0.00–0.07)
Basophils Absolute: 0 10*3/uL (ref 0.0–0.1)
Basophils Relative: 1 %
Eosinophils Absolute: 0.1 10*3/uL (ref 0.0–0.5)
Eosinophils Relative: 3 %
HCT: 28.9 % — ABNORMAL LOW (ref 36.0–46.0)
Hemoglobin: 10 g/dL — ABNORMAL LOW (ref 12.0–15.0)
Immature Granulocytes: 0 %
Lymphocytes Relative: 20 %
Lymphs Abs: 0.6 10*3/uL — ABNORMAL LOW (ref 0.7–4.0)
MCH: 35.8 pg — ABNORMAL HIGH (ref 26.0–34.0)
MCHC: 34.6 g/dL (ref 30.0–36.0)
MCV: 103.6 fL — ABNORMAL HIGH (ref 80.0–100.0)
Monocytes Absolute: 0.4 10*3/uL (ref 0.1–1.0)
Monocytes Relative: 11 %
Neutro Abs: 2.1 10*3/uL (ref 1.7–7.7)
Neutrophils Relative %: 65 %
Platelet Count: 123 10*3/uL — ABNORMAL LOW (ref 150–400)
RBC: 2.79 MIL/uL — ABNORMAL LOW (ref 3.87–5.11)
RDW: 14.4 % (ref 11.5–15.5)
WBC Count: 3.2 10*3/uL — ABNORMAL LOW (ref 4.0–10.5)
nRBC: 0 % (ref 0.0–0.2)

## 2020-07-03 LAB — LACTATE DEHYDROGENASE: LDH: 301 U/L — ABNORMAL HIGH (ref 98–192)

## 2020-07-03 LAB — CMP (CANCER CENTER ONLY)
ALT: 89 U/L — ABNORMAL HIGH (ref 0–44)
AST: 154 U/L — ABNORMAL HIGH (ref 15–41)
Albumin: 3.2 g/dL — ABNORMAL LOW (ref 3.5–5.0)
Alkaline Phosphatase: 179 U/L — ABNORMAL HIGH (ref 38–126)
Anion gap: 9 (ref 5–15)
BUN: 12 mg/dL (ref 8–23)
CO2: 23 mmol/L (ref 22–32)
Calcium: 9.6 mg/dL (ref 8.9–10.3)
Chloride: 100 mmol/L (ref 98–111)
Creatinine: 0.76 mg/dL (ref 0.44–1.00)
GFR, Estimated: >60 mL/min (ref >60)
Glucose, Bld: 255 mg/dL — ABNORMAL HIGH (ref 70–99)
Potassium: 3.9 mmol/L (ref 3.5–5.1)
Sodium: 132 mmol/L — ABNORMAL LOW (ref 135–145)
Total Bilirubin: 1.3 mg/dL — ABNORMAL HIGH (ref 0.3–1.2)
Total Protein: 6.4 g/dL — ABNORMAL LOW (ref 6.5–8.1)

## 2020-07-03 LAB — AMMONIA: Ammonia: 120 umol/L — ABNORMAL HIGH (ref 9–35)

## 2020-07-03 MED ORDER — SODIUM CHLORIDE 0.9% FLUSH
10.0000 mL | Freq: Once | INTRAVENOUS | Status: AC
  Administered 2020-07-03: 10 mL via INTRAVENOUS
  Filled 2020-07-03: qty 10

## 2020-07-03 MED ORDER — HEPARIN SOD (PORK) LOCK FLUSH 100 UNIT/ML IV SOLN
500.0000 [IU] | Freq: Once | INTRAVENOUS | Status: AC
  Administered 2020-07-03: 500 [IU] via INTRAVENOUS
  Filled 2020-07-03: qty 5

## 2020-07-03 NOTE — Progress Notes (Signed)
Hematology and Oncology Follow Up Visit  Beverly Davis [id number] 07/20/1940 79 y.o. 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  She comes in with her son.  She actually seems to be doing pretty well.  She is not confused.  She seems to be eating a little bit better.  She had a decent weekend.  Her ammonia still is on the high side.  Today, her ammonia was 120.  She is still dealing with the hepatocellular carcinoma.  It looks like she is to have a little bit of ascites going on.  Her abdomen looks somewhat distended.  We will get an ultrasound to see if there is fluid there that needs to be removed.  Her last alpha-fetoprotein was further elevated at over thousand.  She is not having any problems with leg swelling.  There is no bleeding.  She is not having any issues with bowels or bladder.  She supposed be taking lactulose.  I would think that she is taking lactulose.  She has had no issues with cough or shortness of breath.  Hospice is seeing her.  They are doing a nice job.  Overall, her performance status is ECOG 2-3.    Medications:  Allergies as of 07/03/2020      Reactions   Metformin And Related Other (See Comments)   Unknown Reaction   Welchol [colesevelam Hcl] Other (See Comments)   Unknown Reaction      Medication List       Accurate as of July 03, 2020  2:36 PM. If you have any  questions, ask your nurse or doctor.        Cal Mag Zinc +D3 Tabs Take 1 tablet by mouth daily.   cyanocobalamin 1000 MCG tablet Take 1,000 mcg by mouth daily.   furosemide 40 MG tablet Commonly known as: LASIX Take 1 tablet (40 mg total) by mouth daily.   glipiZIDE 5 MG tablet Commonly known as: GLUCOTROL TAKE 1/2 TO 1 TABLET BY MOUTH THREE TIMES DAILY WITH MEALS   lactulose 10 GM/15ML solution Commonly known as: CHRONULAC Take 30 mLs (20 g total) by mouth 3 (three) times daily. Hospice patient   levothyroxine 100 MCG tablet Commonly known as: Synthroid Take 1 tablet daily on an empty stomach with only water for 30 minutes & no Antacid meds, Calcium or Magnesium for 4 hours & avoid Biotin   metFORMIN 500 MG tablet Commonly known as: GLUCOPHAGE TAKE 1 TABLET TWICE DAILY WITH BREAKFAST AND LUNCH. TAKE 2 TABLETS WITH SUPPER.   mirabegron ER 25 MG Tb24 tablet Commonly known as: Myrbetriq Take 1 tablet (25 mg total) by mouth at bedtime.   OneTouch Delica Plus Lancet33G Misc USE AS DIRECTED TO CHECK BLOOD SUGAR THREE TIMES DAILY BEFORE MEALS   OneTouch Ultra test strip Generic drug: glucose blood USE AS DIRECTED TO CHECK BLOOD SUGAR THREE TIMES DAILY BEFORE MEALS   spironolactone 50 MG tablet Commonly known as: ALDACTONE Take 1 tablet (50 mg total) by mouth 2 (  two) times daily.   Vitamin D3 125 MCG (5000 UT) Caps Take 5,000 Units by mouth daily.       Allergies:  Allergies  Allergen Reactions  . Metformin And Related Other (See Comments)    Unknown Reaction  . Welchol [Colesevelam Hcl] Other (See Comments)    Unknown Reaction    Past Medical History, Surgical history, Social history, and Family History were reviewed and updated.  Review of Systems: Review of Systems  Constitutional: Negative.   HENT: Negative.   Eyes: Negative.   Respiratory: Positive for shortness of breath.   Cardiovascular: Positive for palpitations.  Gastrointestinal: Positive for  heartburn and nausea.  Genitourinary: Negative.   Musculoskeletal: Negative.   Skin: Negative.   Neurological: Negative.   Endo/Heme/Allergies: Negative.   Psychiatric/Behavioral: Negative.      Physical Exam:  weight is 130 lb (59 kg). Her oral temperature is 98.4 F (36.9 C). Her blood pressure is 92/60 and her pulse is 70. Her respiration is 18 and oxygen saturation is 100%.   Wt Readings from Last 3 Encounters:  07/03/20 130 lb (59 kg)  05/18/20 130 lb (59 kg)  03/16/20 126 lb 1.6 oz (57.2 kg)    Physical Exam Vitals reviewed.  HENT:     Head: Normocephalic and atraumatic.  Eyes:     Pupils: Pupils are equal, round, and reactive to light.  Cardiovascular:     Rate and Rhythm: Normal rate and regular rhythm.     Heart sounds: Normal heart sounds.  Pulmonary:     Effort: Pulmonary effort is normal.     Breath sounds: Normal breath sounds.  Abdominal:     General: Bowel sounds are normal.     Palpations: Abdomen is soft.  Musculoskeletal:        General: No tenderness or deformity. Normal range of motion.     Cervical back: Normal range of motion.  Lymphadenopathy:     Cervical: No cervical adenopathy.  Skin:    General: Skin is warm and dry.     Findings: No erythema or rash.  Neurological:     Mental Status: She is alert and oriented to person, place, and time.  Psychiatric:        Behavior: Behavior normal.        Thought Content: Thought content normal.        Judgment: Judgment normal.      Lab Results  Component Value Date   WBC 3.2 (L) 07/03/2020   HGB 10.0 (L) 07/03/2020   HCT 28.9 (L) 07/03/2020   MCV 103.6 (H) 07/03/2020   PLT 123 (L) 07/03/2020   Lab Results  Component Value Date   FERRITIN 84 03/17/2019   IRON 72 03/17/2019   TIBC 311 03/17/2019   UIBC 239 03/17/2019   IRONPCTSAT 23 03/17/2019   Lab Results  Component Value Date   RETICCTPCT 1.2 09/05/2015   RBC 2.79 (L) 07/03/2020   RETICCTABS 40,080 09/05/2015   No results  found for: KPAFRELGTCHN, LAMBDASER, KAPLAMBRATIO No results found for: Kandis Cocking, IGMSERUM Lab Results  Component Value Date   TOTALPROTELP 7.1 02/03/2008   ALBUMINELP 54.8 (L) 02/03/2008   A1GS 4.2 02/03/2008   A2GS 9.5 02/03/2008   BETS 8.1 (H) 02/03/2008   BETA2SER 5.8 02/03/2008   GAMS 17.6 02/03/2008   MSPIKE NOT DET 02/03/2008   SPEI * 02/03/2008     Chemistry      Component Value Date/Time   NA 136 05/18/2020 1355   NA  139 01/01/2017 1034   K 3.8 05/18/2020 1355   K 4.1 01/01/2017 1034   CL 104 05/18/2020 1355   CL 107 01/01/2017 1034   CO2 22 05/18/2020 1355   CO2 29 01/01/2017 1034   BUN 14 05/18/2020 1355   BUN 9 01/01/2017 1034   CREATININE 0.75 05/18/2020 1355   CREATININE 0.75 09/02/2019 1544      Component Value Date/Time   CALCIUM 9.4 05/18/2020 1355   CALCIUM 9.4 01/01/2017 1034   ALKPHOS 173 (H) 05/18/2020 1355   ALKPHOS 81 01/01/2017 1034   AST 67 (H) 05/18/2020 1355   ALT 44 05/18/2020 1355   ALT 48 (H) 01/01/2017 1034   BILITOT 1.5 (H) 05/18/2020 1355      Impression and Plan: Beverly Davis is a very sweet 80 year old white female.  She has NASH cirrhosis with hepatocellular carcinoma.   Overall, I would have to say that she seems to be holding her own.  I really am not all that bothered by her blood sugars being that high.  It would be nice if they were little bit better.  I think if her blood sugars were around 200 I think this would certainly be reasonable.  Again, I really do not see that her hepatocellular carcinoma is causing that much trouble. Again, her quality of life seems to be doing pretty well.  I think she is satisfied with how well she is doing.    We will plan for another follow-up in 6 weeks.  Hospice is doing a good job with her.  I think they see her about once a week to see her.    [name], MD 4/11/20222:36 PM

## 2020-07-03 NOTE — Patient Instructions (Signed)
Implanted [facility] Guide An implanted port is a device that is placed under the skin. It is usually placed in the chest. The device can be used to give IV medicine, to take blood, or for dialysis. You may have an implanted port if:  You need IV medicine that would be irritating to the small veins in your hands or arms.  You need IV medicines, such as antibiotics, for a long period of time.  You need IV nutrition for a long period of time.  You need dialysis. When you have a port, your health care provider can choose to use the port instead of veins in your arms for these procedures. You may have fewer limitations when using a port than you would if you used other types of long-term IVs, and you will likely be able to return to normal activities after your incision heals. An implanted port has two main parts:  Reservoir. The reservoir is the part where a needle is inserted to give medicines or draw blood. The reservoir is round. After it is placed, it appears as a small, raised area under your skin.  Catheter. The catheter is a thin, flexible tube that connects the reservoir to a vein. Medicine that is inserted into the reservoir goes into the catheter and then into the vein. How is my port accessed? To access your port:  A numbing cream may be placed on the skin over the port site.  Your health care provider will put on a mask and sterile gloves.  The skin over your port will be cleaned carefully with a germ-killing soap and allowed to dry.  Your health care provider will gently pinch the port and insert a needle into it.  Your health care provider will check for a blood return to make sure the port is in the vein and is not clogged.  If your port needs to remain accessed to get medicine continuously (constant infusion), your health care provider will place a clear bandage (dressing) over the needle site. The dressing and needle will need to be changed every week, or as told by your  health care provider. What is flushing? Flushing helps keep the port from getting clogged. Follow instructions from your health care provider about how and when to flush the port. Ports are usually flushed with saline solution or a medicine called heparin. The need for flushing will depend on how the port is used:  If the port is only used from time to time to give medicines or draw blood, the port may need to be flushed: ? Before and after medicines have been given. ? Before and after blood has been drawn. ? As part of routine maintenance. Flushing may be recommended every 4-6 weeks.  If a constant infusion is running, the port may not need to be flushed.  Throw away any syringes in a disposal container that is meant for sharp items (sharps container). You can buy a sharps container from a pharmacy, or you can make one by using an empty hard plastic bottle with a cover. How long will my port stay implanted? The port can stay in for as long as your health care provider thinks it is needed. When it is time for the port to come out, a surgery will be done to remove it. The surgery will be similar to the procedure that was done to put the port in. Follow these instructions at home:  Flush your port as told by your health care  provider.  If you need an infusion over several days, follow instructions from your health care provider about how to take care of your port site. Make sure you: ? Wash your hands with soap and water before you change your dressing. If soap and water are not available, use alcohol-based hand sanitizer. ? Change your dressing as told by your health care provider. ? Place any used dressings or infusion bags into a plastic bag. Throw that bag in the trash. ? Keep the dressing that covers the needle clean and dry. Do not get it wet. ? Do not use scissors or sharp objects near the tube. ? Keep the tube clamped, unless it is being used.  Check your port site every day for signs  of infection. Check for: ? Redness, swelling, or pain. ? Fluid or blood. ? Pus or a bad smell.  Protect the skin around the port site. ? Avoid wearing bra straps that rub or irritate the site. ? Protect the skin around your port from seat belts. Place a soft pad over your chest if needed.  Bathe or shower as told by your health care provider. The site may get wet as long as you are not actively receiving an infusion.  Return to your normal activities as told by your health care provider. Ask your health care provider what activities are safe for you.  Carry a medical alert card or wear a medical alert bracelet at all times. This will let health care providers know that you have an implanted port in case of an emergency.   Get help right away if:  You have redness, swelling, or pain at the port site.  You have fluid or blood coming from your port site.  You have pus or a bad smell coming from the port site.  You have a fever. Summary  Implanted ports are usually placed in the chest for long-term IV access.  Follow instructions from your health care provider about flushing the port and changing bandages (dressings).  Take care of the area around your port by avoiding clothing that puts pressure on the area, and by watching for signs of infection.  Protect the skin around your port from seat belts. Place a soft pad over your chest if needed.  Get help right away if you have a fever or you have redness, swelling, pain, drainage, or a bad smell at the port site. This information is not intended to replace advice given to you by your health care provider. Make sure you discuss any questions you have with your health care provider. Document Revised: 07/26/2019 Document Reviewed: 07/26/2019 Elsevier Patient Education  [place].

## 2020-07-03 NOTE — Telephone Encounter (Signed)
appts made and pritned for pt per 07/03/20 los  [employer]

## 2020-07-04 ENCOUNTER — Ambulatory Visit (HOSPITAL_COMMUNITY)
Admission: RE | Admit: 2020-07-04 | Discharge: 2020-07-04 | Disposition: A | Discharge status: Home / Self Care | Source: Ambulatory Visit | Attending: Hematology & Oncology | Admitting: Hematology & Oncology

## 2020-07-04 ENCOUNTER — Other Ambulatory Visit: Payer: Self-pay | Admitting: Hematology & Oncology

## 2020-07-04 DIAGNOSIS — C220 Liver cell carcinoma: Secondary | ICD-10-CM | POA: Diagnosis not present

## 2020-07-04 DIAGNOSIS — R188 Other ascites: Secondary | ICD-10-CM | POA: Diagnosis not present

## 2020-07-13 ENCOUNTER — Encounter: Payer: Self-pay | Admitting: Hematology & Oncology

## 2020-07-13 NOTE — Progress Notes (Signed)
Received referral for follow up regarding financial concerns.  Forwarded to Baxter Flattery to reach out as this is a [facility] patient.

## 2020-07-31 ENCOUNTER — Inpatient Hospital Stay: Payer: PPO

## 2020-07-31 ENCOUNTER — Other Ambulatory Visit: Payer: PPO

## 2020-07-31 ENCOUNTER — Inpatient Hospital Stay: Payer: PPO | Admitting: Hematology & Oncology

## 2020-08-01 ENCOUNTER — Other Ambulatory Visit: Payer: Self-pay | Admitting: Hematology & Oncology

## 2020-08-01 DIAGNOSIS — K7460 Unspecified cirrhosis of liver: Secondary | ICD-10-CM

## 2020-08-01 DIAGNOSIS — C220 Liver cell carcinoma: Secondary | ICD-10-CM

## 2020-08-01 DIAGNOSIS — M7989 Other specified soft tissue disorders: Secondary | ICD-10-CM

## 2020-08-01 DIAGNOSIS — E039 Hypothyroidism, unspecified: Secondary | ICD-10-CM

## 2020-09-05 ENCOUNTER — Encounter: Payer: PPO | Admitting: Internal Medicine

## 2020-09-21 ENCOUNTER — Other Ambulatory Visit: Payer: Self-pay | Admitting: Hematology & Oncology

## 2020-10-05 ENCOUNTER — Other Ambulatory Visit: Payer: Self-pay | Admitting: Hematology & Oncology

## 2020-10-17 ENCOUNTER — Other Ambulatory Visit: Payer: Self-pay | Admitting: Hematology & Oncology

## 2020-10-17 DIAGNOSIS — C220 Liver cell carcinoma: Secondary | ICD-10-CM

## 2020-10-17 DIAGNOSIS — K7460 Unspecified cirrhosis of liver: Secondary | ICD-10-CM

## 2020-10-17 DIAGNOSIS — M7989 Other specified soft tissue disorders: Secondary | ICD-10-CM

## 2020-10-17 DIAGNOSIS — K7581 Nonalcoholic steatohepatitis (NASH): Secondary | ICD-10-CM

## 2020-11-20 ENCOUNTER — Telehealth: Payer: Self-pay

## 2020-11-20 NOTE — Telephone Encounter (Signed)
Received fax notification from [facility] that patient passed away on May 28, 2022,  Nov 21, 2020 at 5:58am. Dr Marin Olp aware. Flowers ordered. dph

## 2020-11-23 DEATH — deceased

## 2020-12-19 IMAGING — DX DG CHEST 2V
2 series · 2 of 2 positions shown · non-contrast
Comparison: Chest x-ray dated 01/21/2018 and CT scan of the abdomen
and pelvis dated 12/18/2018

CLINICAL DATA: Shortness of breath. Decreased breath sounds on the
right. Hepatocellular carcinoma.

EXAM:
CHEST - 2 VIEW

[chest pa]
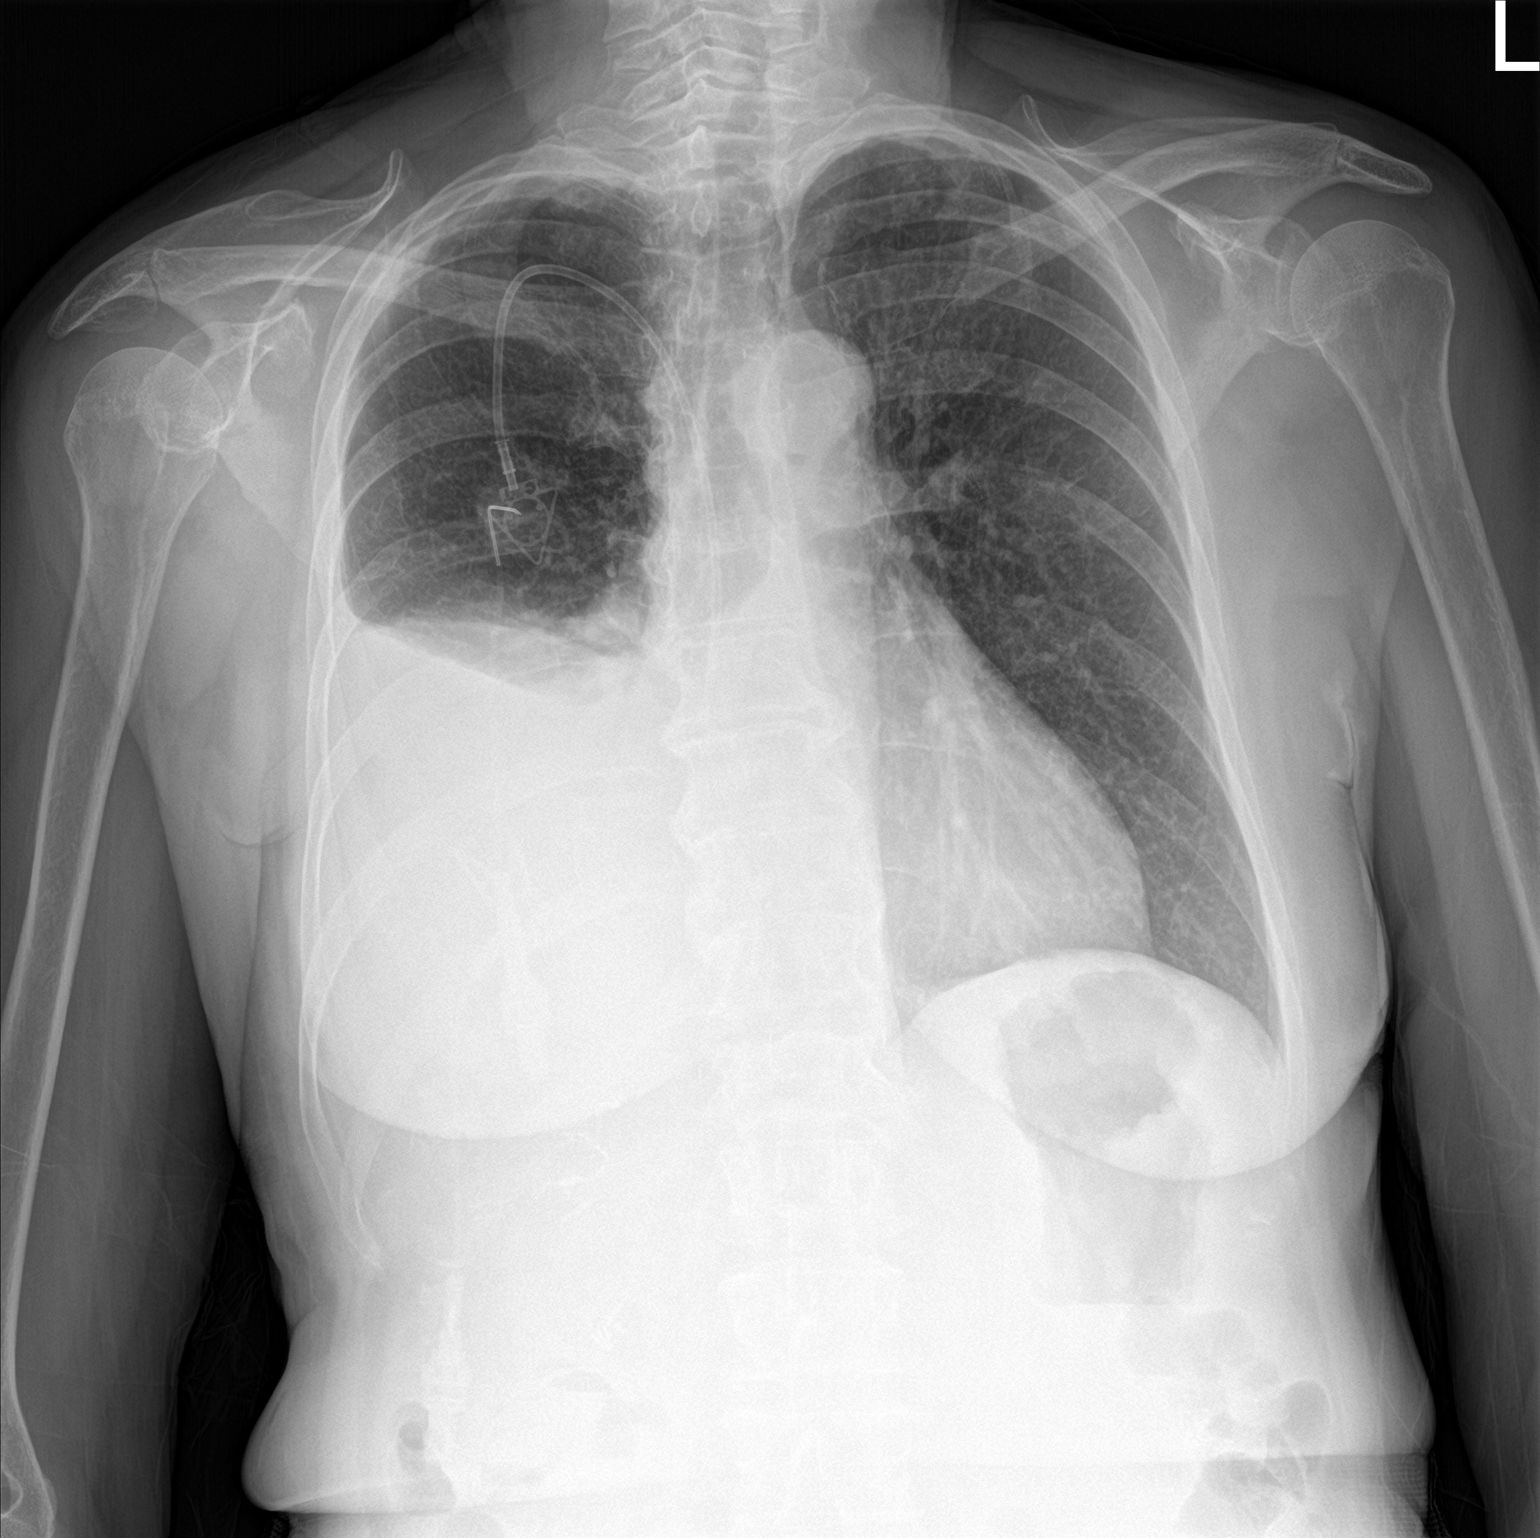

[chest lat]
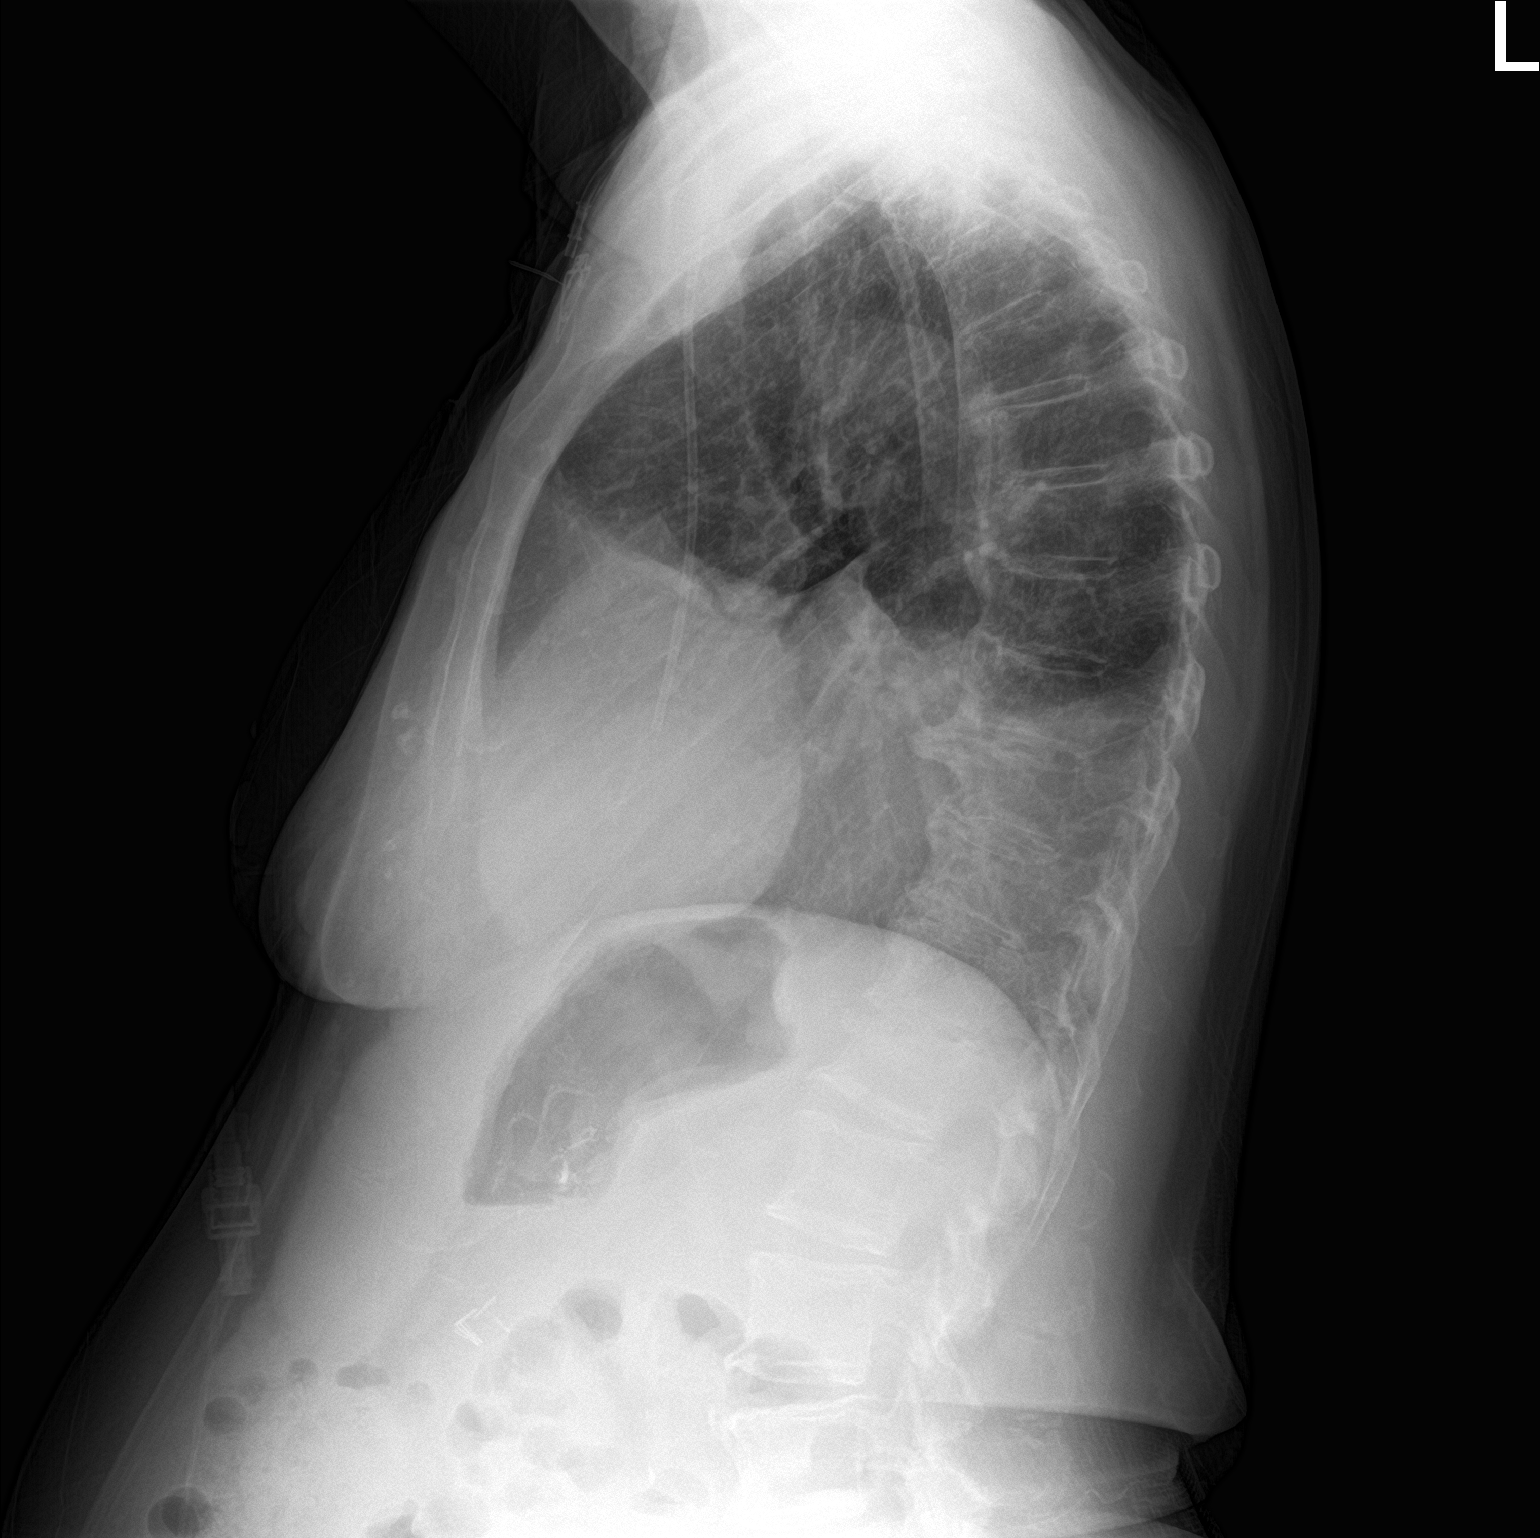

[2 of 2 positions shown; findings below may reference images not displayed]

FINDINGS: The patient has developed a large right pleural effusion since the
prior CT scan of 12/18/2018. There is compressive atelectasis of the
right lung. Left lung is clear. Heart size and vascularity are
normal. Power port appears in good position with the tip above the
cavoatrial junction in good position.

Bones are normal.
IMPRESSION: 1. New large right pleural effusion with compressive atelectasis of
the right lung.
2. Power port in good position.

## 2021-01-22 IMAGING — US US EXTREM LOW VENOUS
1 series · 12 of 24 positions shown · non-contrast
Comparison: None.

CLINICAL DATA: History of hepatocellular carcinoma, now with
bilateral lower extremity pain and edema. Evaluate for DVT.



[Series 1: us extrem low venous · 12 of 66 slices shown]
[im 3/66]
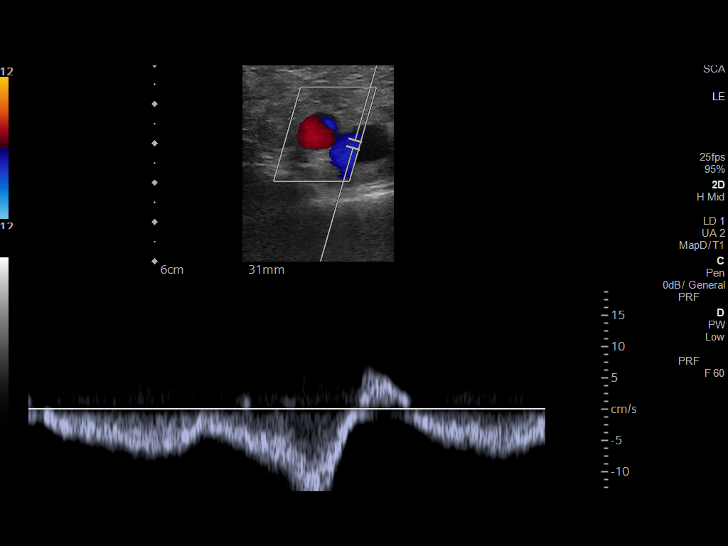
[im 9/66]
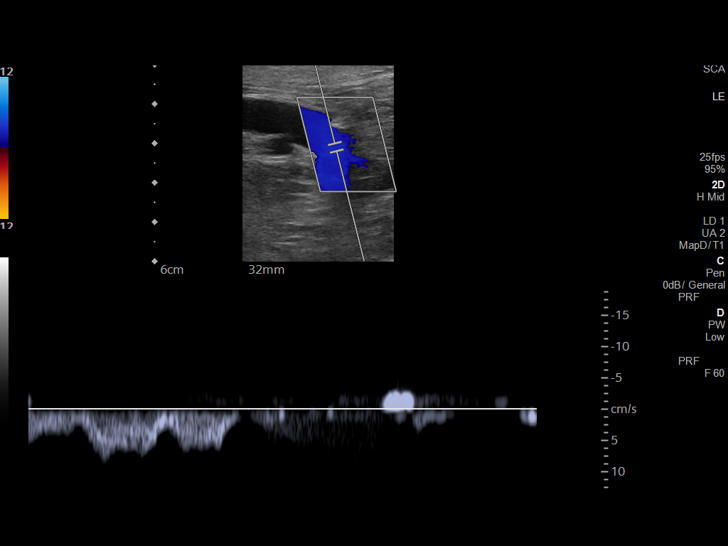
[im 15/66]
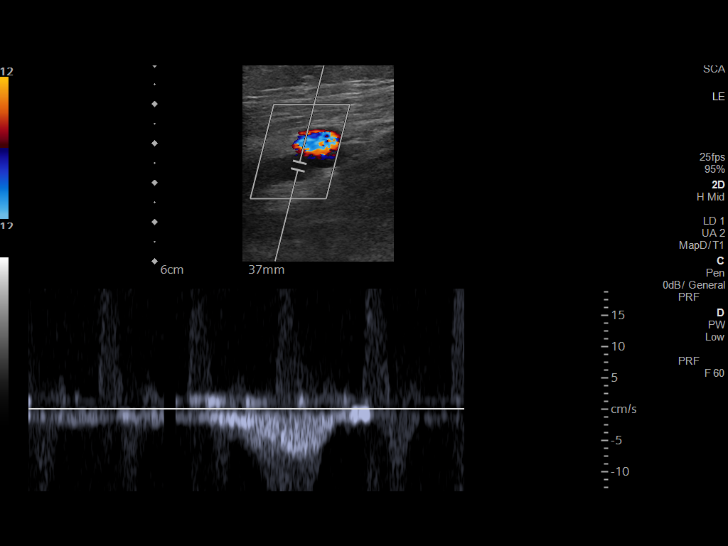
[im 20/66]
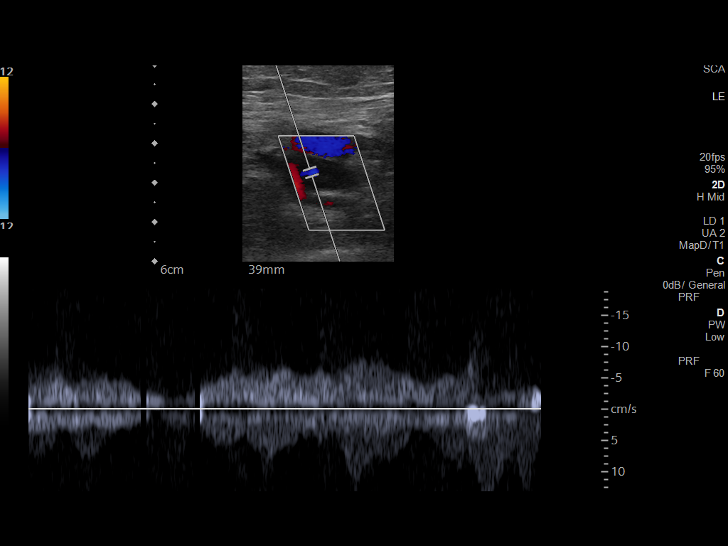
[im 26/66]
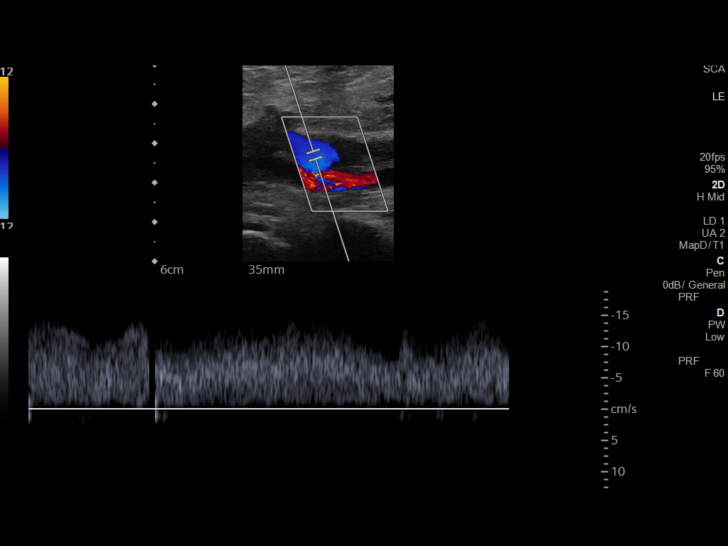
[im 32/66]
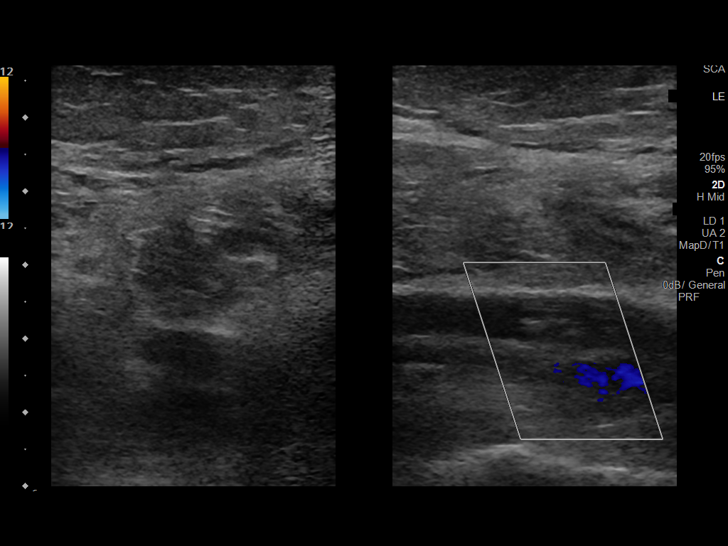
[im 37/66]
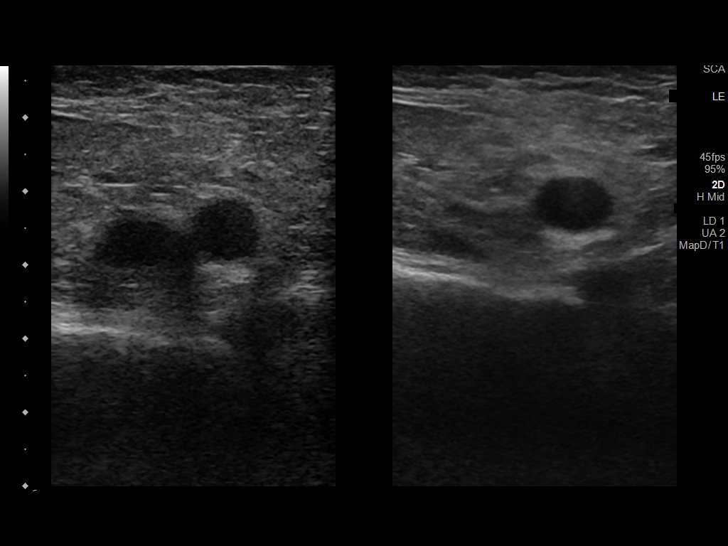
[im 43/66]
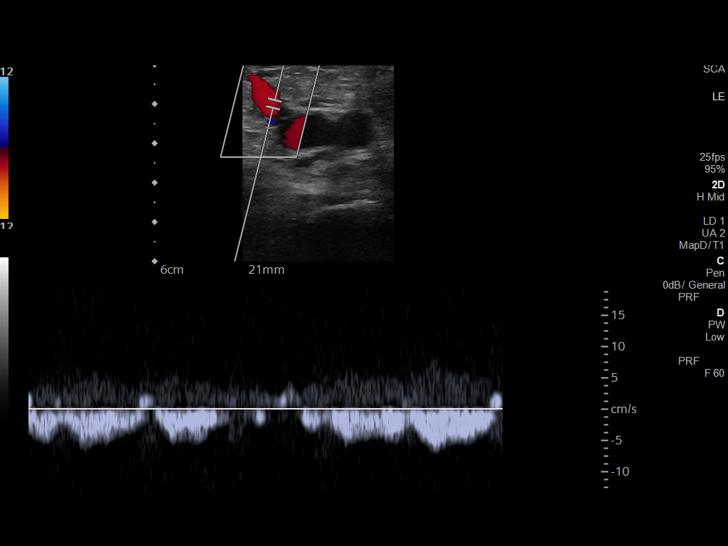
[im 49/66]
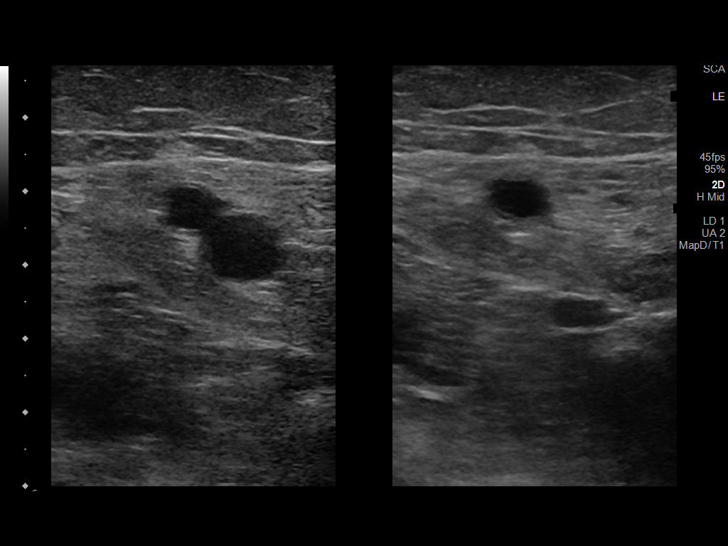
[im 54/66]
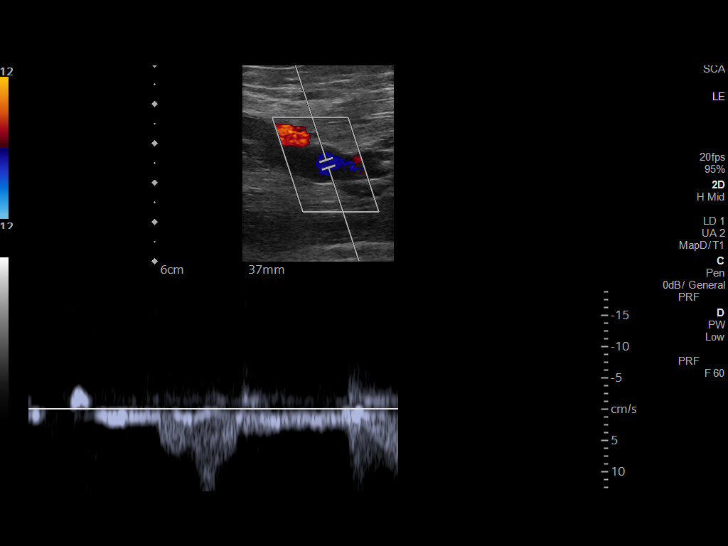
[im 60/66]
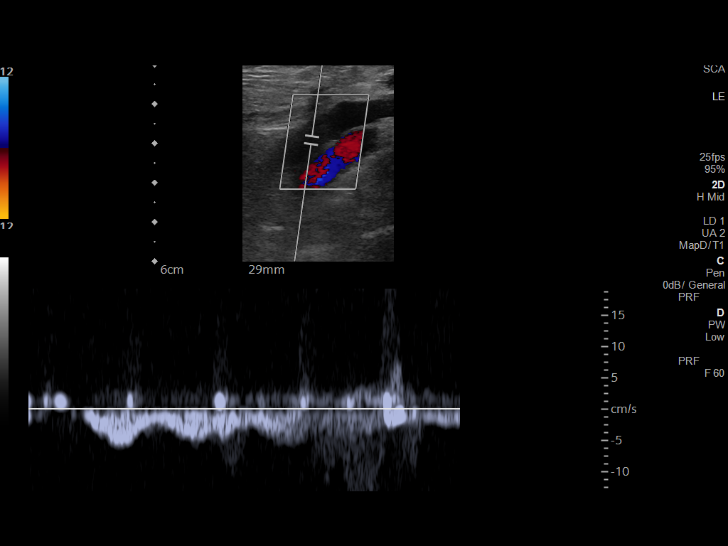
[im 66/66]
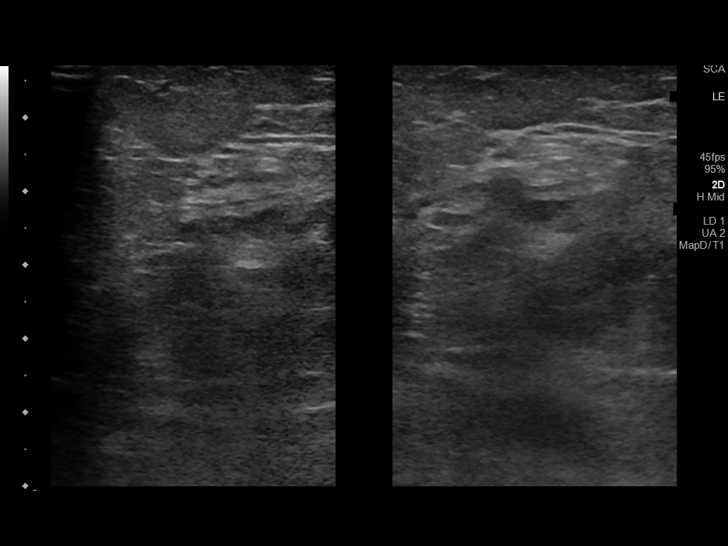

[12 of 24 positions shown; findings below may reference images not displayed]

FINDINGS: RIGHT LOWER EXTREMITY

Common Femoral Vein: No evidence of thrombus. Normal
compressibility, respiratory phasicity and response to augmentation.

Saphenofemoral Junction: No evidence of thrombus. Normal
compressibility and flow on color Doppler imaging.

Profunda Femoral Vein: No evidence of thrombus. Normal
compressibility and flow on color Doppler imaging.

Femoral Vein: While the proximal and mid aspects of the right
femoral vein appear patent, there is hypoechoic occlusive thrombus
within the distal aspect of the right femoral vein (image 20).

Popliteal Vein: There is hypoechoic occlusive thrombus within the
right popliteal vein (images 23 through 26).

Calf Veins: Expansile occlusive thrombus is noted within the right
peroneal (image 32) and posterior tibial veins (image 35

Superficial Great Saphenous Vein: No evidence of thrombus. Normal
compressibility.

Other Findings: Occlusive thrombus extends to involve the right
lesser saphenous vein.

_________________________________________________________

LEFT LOWER EXTREMITY

Common Femoral Vein: No evidence of thrombus. Normal
compressibility, respiratory phasicity and response to augmentation.

Saphenofemoral Junction: No evidence of thrombus. Normal
compressibility and flow on color Doppler imaging.

Profunda Femoral Vein: No evidence of thrombus. Normal
compressibility and flow on color Doppler imaging.

Femoral Vein: No evidence of thrombus. Normal compressibility,
respiratory phasicity and response to augmentation.

Popliteal Vein: No evidence of thrombus. Normal compressibility,
respiratory phasicity and response to augmentation.

Calf Veins: No evidence of thrombus. Normal compressibility and flow
on color Doppler imaging.

Superficial Great Saphenous Vein: No evidence of thrombus. Normal
compressibility.

Other Findings:  None.
IMPRESSION: 1. The examination is positive for occlusive DVT involving the
distal aspect of the right femoral vein extending through the
popliteal vein to involve both the right peroneal and posterior
tibial veins.
2. Examination is positive for occlusive superficial
thrombophlebitis involving the right lesser saphenous vein.
3. No evidence DVT or SVT within the left lower extremity.

## 2021-06-27 ENCOUNTER — Ambulatory Visit: Payer: Self-pay | Admitting: Hematology & Oncology

## 2021-06-27 DIAGNOSIS — C220 Liver cell carcinoma: Secondary | ICD-10-CM
